# Patient Record
Sex: Female | Born: 1935 | Race: White | Hispanic: No | Smoking: Former smoker
Health system: Southern US, Community
[De-identification: ages and names within clinical notes are randomized; demographics above are authoritative.]

## PROBLEM LIST (undated history)

## (undated) DIAGNOSIS — M5136 Other intervertebral disc degeneration, lumbar region: Secondary | ICD-10-CM

## (undated) DIAGNOSIS — G20 Parkinson's disease: Secondary | ICD-10-CM

## (undated) DIAGNOSIS — L309 Dermatitis, unspecified: Secondary | ICD-10-CM

## (undated) DIAGNOSIS — K5909 Other constipation: Secondary | ICD-10-CM

## (undated) DIAGNOSIS — S72002A Fracture of unspecified part of neck of left femur, initial encounter for closed fracture: Secondary | ICD-10-CM

## (undated) DIAGNOSIS — S0990XA Unspecified injury of head, initial encounter: Secondary | ICD-10-CM

## (undated) DIAGNOSIS — R42 Dizziness and giddiness: Secondary | ICD-10-CM

## (undated) DIAGNOSIS — M25569 Pain in unspecified knee: Secondary | ICD-10-CM

## (undated) DIAGNOSIS — M5030 Other cervical disc degeneration, unspecified cervical region: Secondary | ICD-10-CM

## (undated) DIAGNOSIS — R29898 Other symptoms and signs involving the musculoskeletal system: Secondary | ICD-10-CM

## (undated) DIAGNOSIS — M51369 Other intervertebral disc degeneration, lumbar region without mention of lumbar back pain or lower extremity pain: Secondary | ICD-10-CM

## (undated) DIAGNOSIS — K2270 Barrett's esophagus without dysplasia: Secondary | ICD-10-CM

## (undated) DIAGNOSIS — D649 Anemia, unspecified: Secondary | ICD-10-CM

## (undated) DIAGNOSIS — I8390 Asymptomatic varicose veins of unspecified lower extremity: Secondary | ICD-10-CM

## (undated) DIAGNOSIS — K635 Polyp of colon: Secondary | ICD-10-CM

## (undated) DIAGNOSIS — M1990 Unspecified osteoarthritis, unspecified site: Secondary | ICD-10-CM

## (undated) DIAGNOSIS — I2089 Other forms of angina pectoris: Secondary | ICD-10-CM

## (undated) DIAGNOSIS — Z8781 Personal history of (healed) traumatic fracture: Secondary | ICD-10-CM

## (undated) DIAGNOSIS — R5383 Other fatigue: Secondary | ICD-10-CM

## (undated) DIAGNOSIS — R609 Edema, unspecified: Secondary | ICD-10-CM

## (undated) DIAGNOSIS — K649 Unspecified hemorrhoids: Secondary | ICD-10-CM

## (undated) DIAGNOSIS — N979 Female infertility, unspecified: Secondary | ICD-10-CM

## (undated) DIAGNOSIS — R269 Unspecified abnormalities of gait and mobility: Secondary | ICD-10-CM

## (undated) DIAGNOSIS — I10 Essential (primary) hypertension: Secondary | ICD-10-CM

## (undated) DIAGNOSIS — R600 Localized edema: Secondary | ICD-10-CM

## (undated) DIAGNOSIS — K219 Gastro-esophageal reflux disease without esophagitis: Secondary | ICD-10-CM

## (undated) DIAGNOSIS — I208 Other forms of angina pectoris: Secondary | ICD-10-CM

## (undated) DIAGNOSIS — G8929 Other chronic pain: Secondary | ICD-10-CM

## (undated) DIAGNOSIS — K5790 Diverticulosis of intestine, part unspecified, without perforation or abscess without bleeding: Secondary | ICD-10-CM

## (undated) DIAGNOSIS — H8110 Benign paroxysmal vertigo, unspecified ear: Secondary | ICD-10-CM

## (undated) HISTORY — DX: Female infertility, unspecified: N97.9

## (undated) HISTORY — DX: Other constipation: K59.09

## (undated) HISTORY — DX: Parkinson's disease: G20

## (undated) HISTORY — DX: Unspecified injury of head, initial encounter: S09.90XA

## (undated) HISTORY — DX: Benign paroxysmal vertigo, unspecified ear: H81.10

## (undated) HISTORY — DX: Polyp of colon: K63.5

## (undated) HISTORY — DX: Other forms of angina pectoris: I20.89

## (undated) HISTORY — DX: Barrett's esophagus without dysplasia: K22.70

## (undated) HISTORY — DX: Unspecified abnormalities of gait and mobility: R26.9

## (undated) HISTORY — DX: Essential (primary) hypertension: I10

## (undated) HISTORY — DX: Dizziness and giddiness: R42

## (undated) HISTORY — DX: Other forms of angina pectoris: I20.8

## (undated) HISTORY — DX: Other symptoms and signs involving the musculoskeletal system: R29.898

## (undated) HISTORY — DX: Gastro-esophageal reflux disease without esophagitis: K21.9

## (undated) HISTORY — PX: COLONOSCOPY W/ POLYPECTOMY: SHX1380

## (undated) HISTORY — DX: Diverticulosis of intestine, part unspecified, without perforation or abscess without bleeding: K57.90

## (undated) HISTORY — PX: TONSILLECTOMY: SUR1361

## (undated) HISTORY — PX: CHOLECYSTECTOMY: SHX55

## (undated) HISTORY — DX: Dermatitis, unspecified: L30.9

## (undated) HISTORY — DX: Unspecified hemorrhoids: K64.9

## (undated) HISTORY — DX: Unspecified osteoarthritis, unspecified site: M19.90

---
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
## 1986-12-27 HISTORY — PX: PALATE SURGERY: SHX729

## 1994-12-27 HISTORY — PX: KNEE ARTHROSCOPY: SUR90

## 1997-12-27 HISTORY — PX: KNEE ARTHROSCOPY: SUR90

## 1999-01-07 ENCOUNTER — Other Ambulatory Visit: Admission: RE | Admit: 1999-01-07 | Discharge: 1999-01-07 | Payer: Self-pay | Admitting: *Deleted

## 2000-04-20 ENCOUNTER — Other Ambulatory Visit: Admission: RE | Admit: 2000-04-20 | Discharge: 2000-04-20 | Payer: Self-pay | Admitting: *Deleted

## 2001-04-24 ENCOUNTER — Other Ambulatory Visit: Admission: RE | Admit: 2001-04-24 | Discharge: 2001-04-24 | Payer: Self-pay | Admitting: *Deleted

## 2001-12-15 ENCOUNTER — Ambulatory Visit (HOSPITAL_COMMUNITY): Admission: RE | Admit: 2001-12-15 | Discharge: 2001-12-15 | Payer: Self-pay | Admitting: Internal Medicine

## 2002-04-25 ENCOUNTER — Other Ambulatory Visit: Admission: RE | Admit: 2002-04-25 | Discharge: 2002-04-25 | Payer: Self-pay | Admitting: *Deleted

## 2003-04-29 ENCOUNTER — Other Ambulatory Visit: Admission: RE | Admit: 2003-04-29 | Discharge: 2003-04-29 | Payer: Self-pay | Admitting: *Deleted

## 2004-11-05 ENCOUNTER — Ambulatory Visit: Payer: Self-pay | Admitting: Family Medicine

## 2004-11-23 ENCOUNTER — Ambulatory Visit: Payer: Self-pay | Admitting: Family Medicine

## 2004-12-24 ENCOUNTER — Ambulatory Visit: Payer: Self-pay | Admitting: Family Medicine

## 2005-02-23 ENCOUNTER — Ambulatory Visit: Payer: Self-pay | Admitting: Family Medicine

## 2005-03-26 ENCOUNTER — Ambulatory Visit: Payer: Self-pay | Admitting: Family Medicine

## 2005-05-13 ENCOUNTER — Other Ambulatory Visit: Admission: RE | Admit: 2005-05-13 | Discharge: 2005-05-13 | Payer: Self-pay | Admitting: *Deleted

## 2005-07-22 ENCOUNTER — Ambulatory Visit: Payer: Self-pay | Admitting: Family Medicine

## 2005-08-25 ENCOUNTER — Ambulatory Visit (HOSPITAL_COMMUNITY): Admission: RE | Admit: 2005-08-25 | Discharge: 2005-08-25 | Payer: Self-pay | Admitting: *Deleted

## 2005-09-02 HISTORY — PX: CARDIAC CATHETERIZATION: SHX172

## 2005-09-29 ENCOUNTER — Ambulatory Visit (HOSPITAL_COMMUNITY): Admission: RE | Admit: 2005-09-29 | Discharge: 2005-09-29 | Payer: Self-pay | Admitting: *Deleted

## 2006-05-17 ENCOUNTER — Ambulatory Visit: Payer: Self-pay | Admitting: Family Medicine

## 2006-05-19 ENCOUNTER — Other Ambulatory Visit: Admission: RE | Admit: 2006-05-19 | Discharge: 2006-05-19 | Payer: Self-pay | Admitting: Obstetrics and Gynecology

## 2006-05-25 ENCOUNTER — Ambulatory Visit (HOSPITAL_COMMUNITY): Admission: RE | Admit: 2006-05-25 | Discharge: 2006-05-25 | Payer: Self-pay | Admitting: Family Medicine

## 2006-06-17 ENCOUNTER — Observation Stay (HOSPITAL_COMMUNITY): Admission: RE | Admit: 2006-06-17 | Discharge: 2006-06-18 | Payer: Self-pay | Admitting: General Surgery

## 2006-06-17 ENCOUNTER — Encounter (INDEPENDENT_AMBULATORY_CARE_PROVIDER_SITE_OTHER): Payer: Self-pay | Admitting: Specialist

## 2006-06-17 HISTORY — PX: CHOLECYSTECTOMY, LAPAROSCOPIC: SHX56

## 2006-08-02 ENCOUNTER — Ambulatory Visit: Payer: Self-pay | Admitting: Family Medicine

## 2006-09-14 ENCOUNTER — Ambulatory Visit: Payer: Self-pay | Admitting: Family Medicine

## 2006-11-02 ENCOUNTER — Ambulatory Visit: Payer: Self-pay | Admitting: Family Medicine

## 2006-11-03 ENCOUNTER — Ambulatory Visit: Payer: Self-pay | Admitting: Internal Medicine

## 2006-12-12 ENCOUNTER — Ambulatory Visit (HOSPITAL_COMMUNITY): Admission: RE | Admit: 2006-12-12 | Discharge: 2006-12-12 | Payer: Self-pay | Admitting: Internal Medicine

## 2006-12-12 ENCOUNTER — Ambulatory Visit: Payer: Self-pay | Admitting: Internal Medicine

## 2006-12-12 ENCOUNTER — Encounter (INDEPENDENT_AMBULATORY_CARE_PROVIDER_SITE_OTHER): Payer: Self-pay | Admitting: *Deleted

## 2006-12-12 HISTORY — PX: UPPER GI ENDOSCOPY: SHX6162

## 2007-03-30 ENCOUNTER — Ambulatory Visit: Payer: Self-pay | Admitting: Family Medicine

## 2007-06-22 ENCOUNTER — Other Ambulatory Visit: Admission: RE | Admit: 2007-06-22 | Discharge: 2007-06-22 | Payer: Self-pay | Admitting: Obstetrics and Gynecology

## 2007-07-17 ENCOUNTER — Ambulatory Visit: Payer: Self-pay | Admitting: Family Medicine

## 2007-11-08 ENCOUNTER — Encounter: Payer: Self-pay | Admitting: Family Medicine

## 2007-11-08 LAB — CONVERTED CEMR LAB
ALT: 12 units/L (ref 0–35)
AST: 19 units/L (ref 0–37)
Albumin: 4.3 g/dL (ref 3.5–5.2)
Alkaline Phosphatase: 61 units/L (ref 39–117)
BUN: 24 mg/dL — ABNORMAL HIGH (ref 6–23)
Basophils Absolute: 0 10*3/uL (ref 0.0–0.1)
Basophils Relative: 0 % (ref 0–1)
Bilirubin, Direct: 0.1 mg/dL (ref 0.0–0.3)
CO2: 23 meq/L (ref 19–32)
Calcium: 9.8 mg/dL (ref 8.4–10.5)
Chloride: 106 meq/L (ref 96–112)
Cholesterol: 164 mg/dL (ref 0–200)
Creatinine, Ser: 0.88 mg/dL (ref 0.40–1.20)
Eosinophils Absolute: 0.3 10*3/uL (ref 0.2–0.7)
Eosinophils Relative: 5 % (ref 0–5)
Glucose, Bld: 96 mg/dL (ref 70–99)
HCT: 38.7 % (ref 36.0–46.0)
HDL: 62 mg/dL (ref >39)
Hemoglobin: 12.4 g/dL (ref 12.0–15.0)
Indirect Bilirubin: 0.3 mg/dL (ref 0.0–0.9)
LDL Cholesterol: 84 mg/dL (ref 0–99)
Lymphocytes Relative: 13 % (ref 12–46)
Lymphs Abs: 0.8 10*3/uL (ref 0.7–4.0)
MCHC: 32 g/dL (ref 30.0–36.0)
MCV: 95.6 fL (ref 78.0–100.0)
Monocytes Absolute: 0.7 10*3/uL (ref 0.1–1.0)
Monocytes Relative: 12 % (ref 3–12)
Neutro Abs: 4.2 10*3/uL (ref 1.7–7.7)
Neutrophils Relative %: 71 % (ref 43–77)
Platelets: 189 10*3/uL (ref 150–400)
Potassium: 4.6 meq/L (ref 3.5–5.3)
RBC: 4.05 M/uL (ref 3.87–5.11)
RDW: 12.7 % (ref 11.5–15.5)
Sodium: 140 meq/L (ref 135–145)
TSH: 3.17 microintl units/mL (ref 0.350–5.50)
Total Bilirubin: 0.4 mg/dL (ref 0.3–1.2)
Total CHOL/HDL Ratio: 2.6
Total Protein: 7.2 g/dL (ref 6.0–8.3)
Triglycerides: 88 mg/dL (ref <150)
VLDL: 18 mg/dL (ref 0–40)
WBC: 5.9 10*3/uL (ref 4.0–10.5)

## 2007-11-14 ENCOUNTER — Ambulatory Visit: Payer: Self-pay | Admitting: Family Medicine

## 2008-02-25 HISTORY — PX: OTHER SURGICAL HISTORY: SHX169

## 2008-03-19 ENCOUNTER — Ambulatory Visit: Payer: Self-pay | Admitting: Family Medicine

## 2008-03-29 DIAGNOSIS — K649 Unspecified hemorrhoids: Secondary | ICD-10-CM | POA: Insufficient documentation

## 2008-03-29 DIAGNOSIS — R42 Dizziness and giddiness: Secondary | ICD-10-CM | POA: Insufficient documentation

## 2008-06-24 ENCOUNTER — Other Ambulatory Visit: Admission: RE | Admit: 2008-06-24 | Discharge: 2008-06-24 | Payer: Self-pay | Admitting: Obstetrics and Gynecology

## 2008-09-30 ENCOUNTER — Encounter: Payer: Self-pay | Admitting: Family Medicine

## 2008-09-30 LAB — CONVERTED CEMR LAB
ALT: 12 units/L (ref 0–35)
AST: 18 units/L (ref 0–37)
Albumin: 4.2 g/dL (ref 3.5–5.2)
Alkaline Phosphatase: 58 units/L (ref 39–117)
BUN: 17 mg/dL (ref 6–23)
Basophils Absolute: 0 10*3/uL (ref 0.0–0.1)
Basophils Relative: 1 % (ref 0–1)
Bilirubin, Direct: <0.1 mg/dL (ref 0.0–0.3)
CO2: 23 meq/L (ref 19–32)
Calcium: 9.4 mg/dL (ref 8.4–10.5)
Chloride: 103 meq/L (ref 96–112)
Cholesterol: 170 mg/dL (ref 0–200)
Creatinine, Ser: 0.89 mg/dL (ref 0.40–1.20)
Eosinophils Absolute: 0.1 10*3/uL (ref 0.0–0.7)
Eosinophils Relative: 2 % (ref 0–5)
Glucose, Bld: 93 mg/dL (ref 70–99)
HCT: 37.6 % (ref 36.0–46.0)
HDL: 63 mg/dL (ref >39)
Hemoglobin: 11.9 g/dL — ABNORMAL LOW (ref 12.0–15.0)
LDL Cholesterol: 87 mg/dL (ref 0–99)
Lymphocytes Relative: 15 % (ref 12–46)
Lymphs Abs: 0.9 10*3/uL (ref 0.7–4.0)
MCHC: 31.6 g/dL (ref 30.0–36.0)
MCV: 93.5 fL (ref 78.0–100.0)
Monocytes Absolute: 0.6 10*3/uL (ref 0.1–1.0)
Monocytes Relative: 9 % (ref 3–12)
Neutro Abs: 4.4 10*3/uL (ref 1.7–7.7)
Neutrophils Relative %: 74 % (ref 43–77)
Platelets: 184 10*3/uL (ref 150–400)
Potassium: 4.7 meq/L (ref 3.5–5.3)
RBC: 4.02 M/uL (ref 3.87–5.11)
RDW: 12.8 % (ref 11.5–15.5)
Sodium: 138 meq/L (ref 135–145)
Total Bilirubin: 0.3 mg/dL (ref 0.3–1.2)
Total CHOL/HDL Ratio: 2.7
Total Protein: 7 g/dL (ref 6.0–8.3)
Triglycerides: 99 mg/dL (ref <150)
VLDL: 20 mg/dL (ref 0–40)
WBC: 5.9 10*3/uL (ref 4.0–10.5)

## 2008-10-16 ENCOUNTER — Ambulatory Visit: Payer: Self-pay | Admitting: Family Medicine

## 2008-10-16 DIAGNOSIS — R5381 Other malaise: Secondary | ICD-10-CM | POA: Insufficient documentation

## 2008-10-16 DIAGNOSIS — R5383 Other fatigue: Secondary | ICD-10-CM

## 2008-10-16 DIAGNOSIS — L989 Disorder of the skin and subcutaneous tissue, unspecified: Secondary | ICD-10-CM | POA: Insufficient documentation

## 2008-10-16 DIAGNOSIS — I10 Essential (primary) hypertension: Secondary | ICD-10-CM | POA: Insufficient documentation

## 2008-10-16 DIAGNOSIS — K2270 Barrett's esophagus without dysplasia: Secondary | ICD-10-CM | POA: Insufficient documentation

## 2009-03-17 ENCOUNTER — Telehealth: Payer: Self-pay | Admitting: Family Medicine

## 2009-03-19 ENCOUNTER — Telehealth: Payer: Self-pay | Admitting: Family Medicine

## 2009-04-07 ENCOUNTER — Encounter: Payer: Self-pay | Admitting: Family Medicine

## 2009-04-14 ENCOUNTER — Ambulatory Visit: Payer: Self-pay | Admitting: Family Medicine

## 2009-04-14 LAB — CONVERTED CEMR LAB
Hemoglobin: 11.8 g/dL — ABNORMAL LOW (ref 12.0–15.0)
MCHC: 33.4 g/dL (ref 30.0–36.0)
RBC: 3.89 M/uL (ref 3.87–5.11)

## 2009-04-18 ENCOUNTER — Encounter: Payer: Self-pay | Admitting: Family Medicine

## 2009-04-19 DIAGNOSIS — M159 Polyosteoarthritis, unspecified: Secondary | ICD-10-CM | POA: Insufficient documentation

## 2009-09-09 ENCOUNTER — Ambulatory Visit: Payer: Self-pay | Admitting: Family Medicine

## 2009-09-09 DIAGNOSIS — L500 Allergic urticaria: Secondary | ICD-10-CM | POA: Insufficient documentation

## 2009-09-16 ENCOUNTER — Telehealth: Payer: Self-pay | Admitting: Family Medicine

## 2009-09-23 ENCOUNTER — Ambulatory Visit: Payer: Self-pay | Admitting: Family Medicine

## 2009-11-07 ENCOUNTER — Encounter: Payer: Self-pay | Admitting: Cardiology

## 2009-11-17 ENCOUNTER — Encounter (INDEPENDENT_AMBULATORY_CARE_PROVIDER_SITE_OTHER): Payer: Self-pay | Admitting: *Deleted

## 2009-11-17 ENCOUNTER — Telehealth: Payer: Self-pay | Admitting: Family Medicine

## 2009-11-17 LAB — CONVERTED CEMR LAB
BUN: 21 mg/dL
BUN: 21 mg/dL (ref 6–23)
Basophils Relative: 1 %
Basophils Relative: 1 % (ref 0–1)
CO2: 22 meq/L (ref 19–32)
Calcium: 9.8 mg/dL
Calcium: 9.8 mg/dL (ref 8.4–10.5)
Chloride: 103 meq/L
Cholesterol: 176 mg/dL
Creatinine, Ser: 0.9 mg/dL (ref 0.40–1.20)
Eosinophils Absolute: 0.3 10*3/uL (ref 0.0–0.7)
Glucose, Bld: 78 mg/dL
Glucose, Bld: 78 mg/dL (ref 70–99)
HCT: 36.5 %
HDL: 56 mg/dL
Hemoglobin: 11.8 g/dL — ABNORMAL LOW (ref 12.0–15.0)
LDL Cholesterol: 101 mg/dL
LDL Cholesterol: 101 mg/dL
MCHC: 32.3 g/dL (ref 30.0–36.0)
MCV: 92.6 fL (ref 78.0–100.0)
Monocytes Absolute: 0.6 10*3/uL
Monocytes Absolute: 0.6 10*3/uL (ref 0.1–1.0)
Monocytes Relative: 10 %
Monocytes Relative: 10 % (ref 3–12)
Platelets: 203 10*3/uL
Potassium: 4.7 meq/L
RBC: 3.94 M/uL (ref 3.87–5.11)
Sodium: 140 meq/L
Sodium: 140 meq/L
Triglycerides: 96 mg/dL
WBC: 5.8 10*3/uL

## 2009-11-25 ENCOUNTER — Ambulatory Visit: Payer: Self-pay | Admitting: Family Medicine

## 2009-11-27 ENCOUNTER — Encounter: Payer: Self-pay | Admitting: Family Medicine

## 2009-12-10 ENCOUNTER — Encounter (INDEPENDENT_AMBULATORY_CARE_PROVIDER_SITE_OTHER): Payer: Self-pay | Admitting: *Deleted

## 2009-12-12 ENCOUNTER — Ambulatory Visit: Payer: Self-pay | Admitting: Cardiology

## 2009-12-12 ENCOUNTER — Encounter (INDEPENDENT_AMBULATORY_CARE_PROVIDER_SITE_OTHER): Payer: Self-pay | Admitting: *Deleted

## 2009-12-12 DIAGNOSIS — I2510 Atherosclerotic heart disease of native coronary artery without angina pectoris: Secondary | ICD-10-CM | POA: Insufficient documentation

## 2009-12-12 DIAGNOSIS — R0989 Other specified symptoms and signs involving the circulatory and respiratory systems: Secondary | ICD-10-CM | POA: Insufficient documentation

## 2009-12-12 DIAGNOSIS — R0789 Other chest pain: Secondary | ICD-10-CM | POA: Insufficient documentation

## 2009-12-25 ENCOUNTER — Encounter: Payer: Self-pay | Admitting: Cardiology

## 2010-03-16 ENCOUNTER — Telehealth: Payer: Self-pay | Admitting: Family Medicine

## 2010-04-09 ENCOUNTER — Encounter: Payer: Self-pay | Admitting: Family Medicine

## 2010-04-29 ENCOUNTER — Encounter: Payer: Self-pay | Admitting: Family Medicine

## 2010-05-04 ENCOUNTER — Telehealth: Payer: Self-pay | Admitting: Family Medicine

## 2010-05-07 ENCOUNTER — Ambulatory Visit: Payer: Self-pay | Admitting: Cardiology

## 2010-05-07 ENCOUNTER — Encounter (HOSPITAL_COMMUNITY): Admission: RE | Admit: 2010-05-07 | Discharge: 2010-05-07 | Payer: Self-pay | Admitting: Cardiology

## 2010-05-11 ENCOUNTER — Ambulatory Visit: Payer: Self-pay | Admitting: Cardiology

## 2010-05-11 ENCOUNTER — Encounter: Payer: Self-pay | Admitting: Adult Health

## 2010-05-14 ENCOUNTER — Observation Stay (HOSPITAL_COMMUNITY): Admission: RE | Admit: 2010-05-14 | Discharge: 2010-05-15 | Payer: Self-pay | Admitting: Orthopedic Surgery

## 2010-05-14 ENCOUNTER — Encounter: Payer: Self-pay | Admitting: Family Medicine

## 2010-05-14 HISTORY — PX: ROTATOR CUFF REPAIR: SHX139

## 2010-05-19 ENCOUNTER — Telehealth (INDEPENDENT_AMBULATORY_CARE_PROVIDER_SITE_OTHER): Payer: Self-pay

## 2010-08-04 ENCOUNTER — Encounter (INDEPENDENT_AMBULATORY_CARE_PROVIDER_SITE_OTHER): Payer: Self-pay | Admitting: *Deleted

## 2010-08-13 ENCOUNTER — Ambulatory Visit: Payer: Self-pay | Admitting: Family Medicine

## 2010-08-14 ENCOUNTER — Encounter: Payer: Self-pay | Admitting: Family Medicine

## 2010-08-21 ENCOUNTER — Encounter: Payer: Self-pay | Admitting: Family Medicine

## 2010-09-16 ENCOUNTER — Ambulatory Visit: Payer: Self-pay | Admitting: Family Medicine

## 2010-11-18 LAB — CONVERTED CEMR LAB
BUN: 16 mg/dL (ref 6–23)
CO2: 27 meq/L (ref 19–32)
Chloride: 102 meq/L (ref 96–112)
Glucose, Bld: 88 mg/dL (ref 70–99)
LDL Cholesterol: 100 mg/dL — ABNORMAL HIGH (ref 0–99)
Potassium: 4.8 meq/L (ref 3.5–5.3)
TSH: 3.181 microintl units/mL (ref 0.350–4.500)
VLDL: 16 mg/dL (ref 0–40)

## 2010-12-29 ENCOUNTER — Ambulatory Visit
Admission: RE | Admit: 2010-12-29 | Discharge: 2010-12-29 | Payer: Self-pay | Source: Home / Self Care | Attending: Internal Medicine | Admitting: Internal Medicine

## 2010-12-31 LAB — HM COLONOSCOPY

## 2011-01-05 ENCOUNTER — Ambulatory Visit
Admission: RE | Admit: 2011-01-05 | Discharge: 2011-01-05 | Payer: Self-pay | Source: Home / Self Care | Attending: Family Medicine | Admitting: Family Medicine

## 2011-01-06 ENCOUNTER — Encounter: Payer: Self-pay | Admitting: Family Medicine

## 2011-01-26 NOTE — Progress Notes (Signed)
Summary: copy of lab  Phone Note Call from Patient   Summary of Call: needs a copy of lab work and have a stress test on thurs. wants to speak with dr. Lodema Hong about having surgery Initial call taken by: Rudene Anda,  May 04, 2010 4:12 PM  Follow-up for Phone Call        faxed last labs to dr Simonne Come as requested per patient Follow-up by: Adella Hare LPN,  May 04, 9562 4:46 PM

## 2011-01-26 NOTE — Letter (Signed)
Summary: [facility]  [facility]   Imported By: Lind Guest 08/21/2010 16:02:11  _____________________________________________________________________  External Attachment:    Type:   Image     Comment:   External Document

## 2011-01-26 NOTE — Letter (Signed)
Summary: patient surgical history  patient surgical history   Imported By: Dreama Saa, CNA 12/30/2009 10:19:57  _____________________________________________________________________  External Attachment:    Type:   Image     Comment:   External Document

## 2011-01-26 NOTE — Miscellaneous (Signed)
Summary: CBCD,BMP,LIPIDS,11/17/2009  Clinical Lists Changes  Observations: Added new observation of CALCIUM: 9.8 mg/dL (02/54/2706 23:76) Added new observation of CREATININE: 0.90 mg/dL (28/31/5176 16:07) Added new observation of BUN: 21 mg/dL (37/09/6268 48:54) Added new observation of BG RANDOM: 78 mg/dL (62/70/3500 93:81) Added new observation of CO2 PLSM/SER: 22 meq/L (11/17/2009 11:40) Added new observation of CL SERUM: 103 meq/L (11/17/2009 11:40) Added new observation of K SERUM: 4.7 meq/L (11/17/2009 11:40) Added new observation of NA: 140 meq/L (11/17/2009 11:40) Added new observation of LDL: 101 mg/dL (82/99/3716 96:78) Added new observation of HDL: 56 mg/dL (93/81/0175 10:25) Added new observation of TRIGLYC TOT: 96 mg/dL (85/27/7824 23:53) Added new observation of CHOLESTEROL: 176 mg/dL (61/44/3154 00:86) Added new observation of ABSOLUTE BAS: 0.0 K/uL (11/17/2009 11:40) Added new observation of BASOPHIL %: 1 % (11/17/2009 11:40) Added new observation of EOS ABSLT: 0.3 K/uL (11/17/2009 11:40) Added new observation of % EOS AUTO: 6 % (11/17/2009 11:40) Added new observation of ABSOLUTE MON: 0.6 K/uL (11/17/2009 11:40) Added new observation of MONOCYTE %: 10 % (11/17/2009 11:40) Added new observation of ABS LYMPHOCY: 0.7 K/uL (11/17/2009 11:40) Added new observation of LYMPHS %: 12 % (11/17/2009 11:40) Added new observation of PLATELETK/UL: 203 K/uL (11/17/2009 11:40) Added new observation of RDW: 13.0 % (11/17/2009 11:40) Added new observation of MCHC RBC: 32.3 g/dL (76/19/5093 26:71) Added new observation of MCV: 92.6 fL (11/17/2009 11:40) Added new observation of HCT: 36.5 % (11/17/2009 11:40) Added new observation of HGB: 11.8 g/dL (24/58/0998 33:82) Added new observation of RBC M/UL: 3.94 M/uL (11/17/2009 11:40) Added new observation of WBC COUNT: 5.8 10*3/microliter (11/17/2009 11:40)

## 2011-01-26 NOTE — Letter (Signed)
Summary: [facility]  [facility]   Imported By: Lind Guest 08/21/2010 16:03:10  _____________________________________________________________________  External Attachment:    Type:   Image     Comment:   External Document

## 2011-01-26 NOTE — Letter (Signed)
Summary: medical release  medical release   Imported By: Lind Guest 08/14/2010 14:58:07  _____________________________________________________________________  External Attachment:    Type:   Image     Comment:   External Document

## 2011-01-26 NOTE — Assessment & Plan Note (Signed)
Summary: MEDS   Vital Signs:  Patient profile:   77 year old female Menstrual status:  postmenopausal Height:      66.5 inches Weight:      162.50 pounds BMI:     25.93 O2 Sat:      100 % on Room air Pulse rate:   64 / minute Pulse rhythm:   regular Resp:     16 per minute BP sitting:   150 / 80  (left arm)  Vitals Entered By: Adella Hare LPN (August 13, 2010 10:38 AM)  Nutrition Counseling: Patient's BMI is greater than 25 and therefore counseled on weight management options.  O2 Flow:  Room air CC: follow-up visit Is Patient Diabetic? No   Primary Care Provider:  [name] MD  CC:  follow-up visit.  History of Present Illness: Reports  that she has been doing fairly well.. Denies recent fever or chills. Denies sinus pressure, nasal congestion , ear pain or sore throat. Denies chest congestion, or cough productive of sputum. Denies chest pain, palpitations, PND, orthopnea , she does report some leg sweling and fatigue. Denies abdominal pain, nausea, vomitting, diarrhea or constipation. Denies change in bowel movements or bloody stool. Denies dysuria , frequency, incontinence or hesitancy.  Denies headaches, vertigo, seizures. Denies depression, anxiety or insomnia. Denies  rash, lesions, or itch.     Current Medications (verified): 1)  Norvasc 5 Mg Tabs (Amlodipine Besylate) .... Take 1 Tablet By Mouth Once Daily 2)  Colace 100 Mg  Caps (Docusate Sodium) .... Take 1 Cap By Mouth Three Times A Day 3)  Multivitamins   Tabs (Multiple Vitamin) .... Take 1 Tablet By Mouth Once A Day 4)  Altace 5 Mg  Tabs (Ramipril) .... Take 1 Tablet By Mouth Once A Day 5)  Vitamin D 400 Unit  Tabs (Cholecalciferol) .... Take 1 Table Monthly 6)  Prilosec Otc 20 Mg Tbec (Omeprazole Magnesium) .... Take One Tab By Mouth Every Morning 7)  Phillips 500 Mg Tabs (Magnesium Oxide) .... Take Two Tabs Every Other Day 8)  Meclizine Hcl 12.5 Mg Tabs (Meclizine Hcl) .... Take As  Needed 9)  Healthy Colon  Caps (Misc Intestinal Flora Regulat) .... Take One Tab By Mouth Once Daily 10)  Aleve 220 Mg Tabs (Naproxen Sodium) .... Take 2 Tabs Two Times A Day 11)  Benefiber  Powd (Wheat Dextrin) .... Use 2 Scoops By Mouth Three Times A Day 12)  Nitrostat 0.4 Mg Subl (Nitroglycerin) .Marland Kitchen.. 1 Tablet Under Tongue At Onset of Chest Pain; You May Repeat Every 5 Minutes For Up To 3 Doses. 13)  Metoprolol Succinate 25 Mg Xr24h-Tab (Metoprolol Succinate) .... Take 1 Tablet By Mouth Once Daily 14)  Viactiv 500-500-40 Mg-Unt-Mcg Chew (Calcium-Vitamin D-Vitamin K) .... One Chew Two Times A Day  Allergies (verified): 1)  ! Penicillin  Past History:  Past medical, surgical, family and social histories (including risk factors) reviewed, and no changes noted (except as noted below).  Past Medical History: Reviewed history from 11/28/2007 and no changes required. HYPERTENSION RECURRENT DERMATITIS OSTEOARTHRITIS OF NECK CHRONIC CONSTIPATION DIVERTICULOSIS GERD STATUS POST FALL WITH TRAUMA TO HEAD, UPPER AND LOWER EXTREMITIES VITAMIN D DEFICIENCY RECURRNET VERTIGO OSTEOPOROSIS, KNEES (ICD-733.00) INTERMITTENT VERTIGO (ICD-780.4) HEMORRHOIDS (ICD-455.6)  Past Surgical History: Rt knee arthroscopy 1999 Lt knee arthroscopy 1996 Cholecystectomy 2007 rotator cuff surgery May 14, 2010  Family History: Reviewed history from 11/28/2007 and no changes required. MOTHER DECEASED CVA/ HEART DISEASE/ HTN FATHER DECEASED SISTER DECEASED  PARKINSONS ZERO BROTHERS  Social History: Reviewed history from 11/28/2007 and no changes required. Retired Married Former Smoker Alcohol use-no Drug use-no  Review of Systems      See HPI General:  Complains of fatigue. Eyes:  Denies discharge, eye pain, and red eye. ENT:  Denies nasal congestion, sinus pressure, and sore throat. CV:  Complains of shortness of breath with exertion and swelling of feet; denies difficulty breathing while lying  down and palpitations; reports swelling of feet with inc dose of amlodipine and has not been taking it, alsofatigue, wants to g back to [employer], dr Allyson Sabal. GI:  Denies abdominal pain, constipation, diarrhea, nausea, and vomiting. GU:  Denies dysuria, urinary frequency, and urinary hesitancy. MS:  Complains of joint pain; had shoulder surgery several months ago, currently in therapy, multiple general;ised joint pains. Derm:  Denies itching and rash. Neuro:  Complains of falling down and poor balance; denies headaches; duento musculoskeletl probs. Psych:  Denies anxiety and depression. Endo:  Denies cold intolerance, excessive thirst, excessive urination, and polyuria. Heme:  Denies abnormal bruising and bleeding. Allergy:  Denies hives or rash and itching eyes.  Physical Exam  General:  Well-developed,well-nourished,in no acute distress; alert,appropriate and cooperative throughout examination HEENT: No facial asymmetry,  EOMI, No sinus tenderness, TM's Clear, oropharynx  pink and moist.   Chest: Clear to auscultation bilaterally.  CVS: S1, S2, No murmurs, No S3.   Abd: Soft, Nontender.  MS: decreased  ROM spine, hips, shoulders and knees.  Ext: No edema.   CNS: CN 2-12 intact, power tone and sensation normal throughout.   Skin: Intact, no visible lesions or rashes.  Psych: Good eye contact, normal affect.  Memory intact, not anxious or depressed appearing.    Impression & Recommendations:  Problem # 1:  HYPERTENSION (ICD-401.9) Assessment Unchanged  The following medications were removed from the medication list:    Norvasc 5 Mg Tabs (Amlodipine besylate) .Marland Kitchen... Take 1 tablet by mouth once daily    Altace 5 Mg Tabs (Ramipril) .Marland Kitchen... Take 1 tablet by mouth once a day    Metoprolol Succinate 25 Mg Xr24h-tab (Metoprolol succinate) .Marland Kitchen... Take 1 tablet by mouth once daily Her updated medication list for this problem includes:    Amlodipine Besylate 5 Mg Tabs (Amlodipine  besylate) .Marland Kitchen... Take 1 tablet by mouth once a day    Ramipril 10 Mg Caps (Ramipril) .Marland Kitchen... Take 1 capsule by mouth once a day  Orders: T-Basic Metabolic Panel [id number])  BP today: 150/80 Prior BP: 146/67 (05/11/2010)  Labs Reviewed: K+: 4.7 (11/17/2009) Creat: : 0.90 (11/17/2009)   Chol: 176 (11/17/2009)   HDL: 56 (11/17/2009)   LDL: 101 (11/17/2009)   TG: 96 (11/17/2009)  Problem # 2:  BARRETTS ESOPHAGUS (ICD-530.85) Assessment: Comment Only need info from Dr Renae Fickle as to when next upper endo dueaqlso when next colonscopy is due  Complete Medication List: 1)  Colace 100 Mg Caps (Docusate sodium) .... Take 1 cap by mouth three times a day 2)  Multivitamins Tabs (Multiple vitamin) .... Take 1 tablet by mouth once a day 3)  Vitamin D 400 Unit Tabs (Cholecalciferol) .... Take 1 table monthly 4)  Prilosec Otc 20 Mg Tbec (Omeprazole magnesium) .... Take one tab by mouth every morning 5)  Phillips 500 Mg Tabs (Magnesium oxide) .... Take two tabs every other day 6)  Meclizine Hcl 12.5 Mg Tabs (Meclizine hcl) .... Take as needed 7)  Healthy Colon Caps (Misc intestinal flora regulat) .... Take one tab by mouth once daily 8)  Aleve 220 Mg Tabs (Naproxen sodium) .... Take 2 tabs two times a day 9)  Benefiber Powd (Wheat dextrin) .... Use 2 scoops by mouth three times a day 10)  Nitrostat 0.4 Mg Subl (Nitroglycerin) .Marland Kitchen.. 1 tablet under tongue at onset of chest pain; you may repeat every 5 minutes for up to 3 doses. 11)  Viactiv 500-500-40 Mg-unt-mcg Chew (Calcium-vitamin d-vitamin k) .... One chew two times a day 12)  Amlodipine Besylate 5 Mg Tabs (Amlodipine besylate) .... Take 1 tablet by mouth once a day 13)  Ramipril 10 Mg Caps (Ramipril) .... Take 1 capsule by mouth once a day  Other Orders: T-Lipid Profile ([id number]) T-TSH ([id number])  Patient Instructions: 1)  Please schedule a follow-up appointment in 4 months. 2)  BMP prior to visit, ICD-9: 3)  Lipid Panel prior to visit,  ICD-9:  fasting in 4 months 4)  TSH prior to visit, ICD-9: 5)  med changes as discusssed.  Prescriptions: RAMIPRIL 10 MG CAPS (RAMIPRIL) Take 1 capsule by mouth once a day  #30 x 3   Entered and Authorized by:   [name] MD   Signed by:   [name] MD on 08/13/2010   Method used:   Printed then faxed to ...       [facility] [address] [address] [id number]       [address]       [place], [place]  [address]       Ph: [phone] or [phone]       Fax: [phone]   RxID:   [id number] AMLODIPINE BESYLATE 5 MG TABS (AMLODIPINE BESYLATE) Take 1 tablet by mouth once a day  #30 x 3   Entered and Authorized by:   [name] MD   Signed by:   [name] MD on 08/13/2010   Method used:   Printed then faxed to ...       [facility] [address] [id number]       [address]       [place], [place]  [address]       Ph: [phone] or [phone]       Fax: [phone]   RxID:   [id number]

## 2011-01-26 NOTE — Progress Notes (Signed)
Summary: Medication Side Effects  Phone Note Call from Patient   Caller: Patient Reason for Call: Talk to Nurse Summary of Call: S: pt states she is having medication side effects/pls return call/tg Initial call taken by: Raechel Ache [facility],  May 19, 2010 3:39 PM  Follow-up for Phone Call        B: On last office visit of 05-11-10, pt. was started on Metoprolol 25mg  once daily and increased Amlodipine to 10mg  from 5 mg once daily. A: Pt. states that since starting Metoprolol she has pitting edema and is no longer going to take.  R: Pt. advised that edema is not a side effect of Metoprolol but that the increase in Amlodipine maybe causing swelling. Pt. states she will continue taking Metoprolol 25mg  once daily but not the increased dose of Amlodipine, she will go back to 5 mg daily.  Follow-up by: Larita Fife Via LPN,  May 19, 2010 4:55 PM  Additional Follow-up for Phone Call Additional follow up Details #1::         Reviewed Juanito Doom, MD  Additional Follow-up by: Gaylord Shih, MD, [facility],  May 19, 2010 5:26 PM    New/Updated Medications: NORVASC 5 MG TABS (AMLODIPINE BESYLATE) take 1 tablet by mouth once daily

## 2011-01-26 NOTE — Assessment & Plan Note (Signed)
Summary: FLU SHOT  Nurse Visit   Allergies: 1)  ! Penicillin  Immunizations Administered:  Influenza Vaccine # 1:    Vaccine Type: Fluvax Non-MCR    Site: right deltoid    Mfr: novartis    Dose: 0.5 ml    Route: IM    Given by: Adella Hare LPN    Exp. Date: 04/2011    Lot #: [id number]    VIS given: 07/21/10 version given September 16, 2010.  Orders Added: 1)  Influenza Vaccine NON MCR [00028]

## 2011-01-26 NOTE — Assessment & Plan Note (Signed)
Summary: f/u myoview to be done on 5/12/tg   Visit Type:  Follow-up Primary Provider:  [name] MD  CC:  NO CARDIOLOGY COMPLAINTS TODAY.  History of Present Illness: Kerry Flynn is a very pleasant 52 CF with known history of non-obstructive CAD per cardiac cath 2006, stable angina (for years), hypertension, and recent diagnosis of torn R rotator cuff.   When last seen by Dr. Daleen Squibb in 11/2009, she complained of the chronic exertional chest discomfort. Dr. Daleen Squibb thought that she needed to have a cardiac cath, but the patient was reluctant to proceed but agreed to a stress myoview.  She did not complete this right away.  She was tested on 05/07/2010.  She is to have a planned R rotator cuff repair once she is cleared by cardiology and waited to have stress test until just before this visit.  Her surgeon is Dr.[place], [facility], [address], [address], [place].  She continues to have exertional chest discomfort, walking up stairs or steep incline.  Otherwise she is complaining of R shoulder and arm pain.  Review of the stress myoview dated 05/07/2010 revealed adequate exercise tolerance for age, a negative stress EKG, normal left ventricular systolic function and  normal myocardial perfusion.  Current Medications (verified): 1)  Norvasc 10 Mg Tabs (Amlodipine Besylate) .... Take 1 Tablet By Mouth Once Daily 2)  Colace 100 Mg  Caps (Docusate Sodium) .... Take 1 Tablet By Mouth Two Times A Day 3)  Multivitamins   Tabs (Multiple Vitamin) .... Take 1 Tablet By Mouth Once A Day 4)  Altace 5 Mg  Tabs (Ramipril) .... Take 1 Tablet By Mouth Once A Day 5)  Vitamin D 400 Unit  Tabs (Cholecalciferol) .... Take 1 Table Monthly 6)  Prilosec Otc 20 Mg Tbec (Omeprazole Magnesium) .... Take One Tab By Mouth Every Morning 7)  Phillips 500 Mg Tabs (Magnesium Oxide) .... Take Two Tabs Every Other Day 8)  Meclizine Hcl 12.5 Mg Tabs (Meclizine Hcl) .... Take As Needed 9)  Healthy Colon  Caps  (Misc Intestinal Flora Regulat) .... Take 2 Tab By Mouth Two Times A Day 10)  Aleve 220 Mg Tabs (Naproxen Sodium) .... Take 2 Tabs Two Times A Day 11)  Benefiber  Powd (Wheat Dextrin) .... Use 1 Scoop Two Times A Day 12)  Nitrostat 0.4 Mg Subl (Nitroglycerin) .Marland Kitchen.. 1 Tablet Under Tongue At Onset of Chest Pain; You May Repeat Every 5 Minutes For Up To 3 Doses. 13)  Metoprolol Succinate 25 Mg Xr24h-Tab (Metoprolol Succinate) .... Take 1 Tablet By Mouth Once Daily  Allergies (verified): 1)  ! Penicillin PMH-FH-SH reviewed-no changes except otherwise noted  Review of Systems       Chronic exertional chest discomfort and R shoulder and arm pain.  All other systems have been reviewed and are negative unless stated above.   Vital Signs:  Patient profile:   77 year old female Menstrual status:  postmenopausal Weight:      166 pounds Pulse rate:   77 / minute BP sitting:   146 / 67  (right arm)  Vitals Entered By: Dreama Saa, CNA (May 11, 2010 1:16 PM)  Physical Exam  General:  Well developed, well nourished, in no acute distress. Head:  normocephalic and atraumatic Eyes:  PERRLA/EOM intact; conjunctiva and lids normal. Ears:  TM's intact and clear with normal canals and hearing Nose:  no deformity, discharge, inflammation, or lesions Mouth:  Teeth, gums and palate normal. Oral mucosa normal. Lungs:  Clear bilaterally to auscultation and percussion. Heart:  Non-displaced PMI, chest non-tender; regular rate and rhythm, S1, S2 without murmurs, rubs or gallops. Carotid upstroke normal, no bruit. Normal abdominal aortic size, no bruits. Femorals normal pulses, no bruits. Pedals normal pulses. No edema, no varicosities. Abdomen:  Bowel sounds positive; abdomen soft and non-tender without masses, organomegaly, or hernias noted. No hepatosplenomegaly. Msk:  decreased ROM right shoulder and arm. Extremities:  R arm painful ROM good pulses.  No edema Neurologic:  Alert and oriented x 3. Psych:   Normal affect.   Impression & Recommendations:  Problem # 1:  PRE-OPERATIVE CARDIAC EXAM (ICD-V72.81) Kerry Flynn is stable from a cardiac standpoint.  Her angina is chronic and has not changed in intensity or duration.  Stress myoview is negative for ischemia.  She is a low risk for cardiac complications peri-operatively.She has been seen and examined by Dr. Dietrich Pates as well during this visit.  He has cleared her to proceed with rotator cuff repair. The following medications were removed from the medication list:M    Aspirin 81 Mg Tbec (Aspirin) .Marland Kitchen... Take one tablet by mouth daily Her updated medication list for this problem includes:    Norvasc 10 Mg Tabs (Amlodipine besylate) .Marland Kitchen... Take 1 tablet by mouth once daily    Altace 5 Mg Tabs (Ramipril) .Marland Kitchen... Take 1 tablet by mouth once a day    Nitrostat 0.4 Mg Subl (Nitroglycerin) .Marland Kitchen... 1 tablet under tongue at onset of chest pain; you may repeat every 5 minutes for up to 3 doses.    Metoprolol Succinate 25 Mg Xr24h-tab (Metoprolol succinate) .Marland Kitchen... Take 1 tablet by mouth once daily  Problem # 2:  HYPERTENSION (ICD-401.9) She has been mildly hypertensive during last two visits.,  We will add low dose metoprolol 25mg  daily for angina symptoms and blood pressure.  She will follow up with Dr. Daleen Squibb in 3 months. The following medications were removed from the medication list:    Aspirin 81 Mg Tbec (Aspirin) .Marland Kitchen... Take one tablet by mouth daily Her updated medication list for this problem includes:    Norvasc 10 Mg Tabs (Amlodipine besylate) .Marland Kitchen... Take 1 tablet by mouth once daily    Altace 5 Mg Tabs (Ramipril) .Marland Kitchen... Take 1 tablet by mouth once a day    Metoprolol Succinate 25 Mg Xr24h-tab (Metoprolol succinate) .Marland Kitchen... Take 1 tablet by mouth once daily  Patient Instructions: 1)  Your physician recommends that you schedule a follow-up appointment in: 3 months 2)  Your physician has recommended you make the following change in your medication:  Start taking Metoprolol 25mg  by mouth once daily and increase Amlodipine (Norvasc) to 10mg  by mouth once daily  Prescriptions: NORVASC 10 MG TABS (AMLODIPINE BESYLATE) take 1 tablet by mouth once daily  #30 x 3   Entered by:   Larita Fife Via LPN   Authorized by:   Joni Reining, NP   Signed by:   Larita Fife Via LPN on 65/78/4696   Method used:   [facility] to        [facility] [id number]       [address]       [place], [place]  [address]       Ph: [phone] or [phone]       Fax: [phone]   RxID:   [id number] METOPROLOL SUCCINATE 25 MG XR24H-TAB (METOPROLOL SUCCINATE) take 1 tablet by mouth once daily  #30 x  3   Entered by:   Larita Fife Via LPN   Authorized by:   Joni Reining, NP   Signed by:   Larita Fife Via LPN on 16/09/9603   Method used:   [facility]ron[facility]        [facility] [id number]       [address]       [place], [place]  [address]       Ph: [phone] or [phone]       Fax: [phone]   RxID:   [id number]

## 2011-01-26 NOTE — Progress Notes (Signed)
Summary: refill  Phone Note Call from Patient   Summary of Call: needs a refill autherzation on meds. ramipril 5mg    [phone] Initial call taken by: Rudene Anda,  March 16, 2010 4:06 PM    Prescriptions: ALTACE 5 MG  TABS (RAMIPRIL) Take 1 tablet by mouth once a day  #30 Capsule x 3   Entered by:   Adella Hare LPN   Authorized by:   [name] MD   Signed by:   Adella Hare LPN on 62/13/0865   Method used:   Electron[facility] to        [facility] [address]       [address]       [place], [place]  [address]       Ph: [phone] or [phone]       Fax: [phone]   RxID:   [id number]

## 2011-01-26 NOTE — Letter (Signed)
Summary: Clearance Letter  [facility]  [address] [place] [address]   Phone: [phone]  Fax: [phone]    May 11, 2010  Re:     Kerry Flynn Address:   [address]     [place], [place]  [address] DOB:     12-03-1927 MRN:     [id number]   Dear Dr. Simonne Come,    Kerry Flynn is stable from a cardiac standpoint.  Her angina is chronic and has not   changed in intensity or duration.  Stress myoview is negative for ischemia.  She is a low risk for   cardiac complications peri-operatively. She has been seen and examined by Dr. Dietrich Pates as well   during this visit.  He has cleared her to proceed with rotator cuff repair.           Sincerely,  Gladis Riffle, NP

## 2011-01-26 NOTE — Letter (Signed)
Summary: [facility]  [facility]   Imported By: Lind Guest 08/21/2010 16:03:39  _____________________________________________________________________  External Attachment:    Type:   Image     Comment:   External Document

## 2011-01-26 NOTE — Op Note (Signed)
Summary: ROTATOR CUFF  ROTATOR CUFF   Imported By: Lind Guest 08/21/2010 16:02:44  _____________________________________________________________________  External Attachment:    Type:   Image     Comment:   External Document

## 2011-01-28 NOTE — Consult Note (Signed)
Summary: Consultation Report rehman  Consultation Report rehman   Imported By: Lind Guest 01/06/2011 11:13:16  _____________________________________________________________________  External Attachment:    Type:   Image     Comment:   External Document

## 2011-01-28 NOTE — Assessment & Plan Note (Signed)
Summary: office visit   Vital Signs:  Patient profile:   77 year old female Menstrual status:  postmenopausal Height:      66.5 inches Weight:      168 pounds BMI:     26.81 O2 Sat:      99 % Pulse rate:   82 / minute Pulse rhythm:   regular Resp:     16 per minute BP sitting:   144 / 64  (left arm) Cuff size:   regular  Vitals Entered By: Everitt Amber LPN (January 05, 2011 1:40 PM)  Nutrition Counseling: Patient's BMI is greater than 25 and therefore counseled on weight management options. CC: Follow up chronic problems   Primary Care Provider:  [name] MD  CC:  Follow up chronic problems.  History of Present Illness: Pt fell on her steps at home in Dec when the right kneee gave out, has had injections to the knee with some benefit, has been told she needs a replacement but is waiting on this, She reports instability, and is considering getting a lift chair to navigate the steps to her basement. Reports  that she has otherwise been doing well.. Denies recent fever or chills. Denies sinus pressure, nasal congestion , ear pain or sore throat. Denies chest congestion, or cough productive of sputum. Denies chest pain, palpitations, PND, orthopnea or leg swelling. Denies abdominal pain, nausea, vomitting, diarrhea or constipation. Denies change in bowel movements or bloody stool. Denies dysuria , frequency, incontinence or hesitancy. Mamo and pap reportedly up to date. Denies headaches, vertigo, seizures. Denies depression, anxiety or insomnia. Denies  rash, lesions, or itch.     Current Medications (verified): 1)  Colace 100 Mg  Caps (Docusate Sodium) .... Take 1 Tablet By Mouth Four Times A Day 2)  Vitamin D 400 Unit  Tabs (Cholecalciferol) .... Take 2 Tabs Monthly 3)  Prilosec Otc 20 Mg Tbec (Omeprazole Magnesium) .... Take One Tab By Mouth Every Morning 4)  Phillips 500 Mg Tabs (Magnesium Oxide) .... Take 1 Tablet By Mouth Once A Day 5)  Meclizine Hcl 12.5 Mg  Tabs (Meclizine Hcl) .... Take As Needed 6)  Healthy Colon  Caps (Misc Intestinal Flora Regulat) .... Take One Tab By Mouth Once Daily 7)  Aleve 220 Mg Tabs (Naproxen Sodium) .... Take 2 Tabs Two Times A Day 8)  Benefiber  Powd (Wheat Dextrin) .... Use 2 Scoops By Mouth Three Times A Day 9)  Nitrostat 0.4 Mg Subl (Nitroglycerin) .Marland Kitchen.. 1 Tablet Under Tongue At Onset of Chest Pain; You May Repeat Every 5 Minutes For Up To 3 Doses. 10)  Viactiv 500-500-40 Mg-Unt-Mcg Chew (Calcium-Vitamin D-Vitamin K) .... One Chew Two Times A Day 11)  Amlodipine Besylate 5 Mg Tabs (Amlodipine Besylate) .... Take 1 Tablet By Mouth Once A Day 12)  Ramipril 10 Mg Caps (Ramipril) .... Take 1 Capsule By Mouth Once A Day  Allergies (verified): 1)  ! Penicillin  Review of Systems      See HPI General:  Complains of fatigue. Eyes:  Denies discharge, eye pain, and red eye. MS:  Complains of joint pain and stiffness; increased bilateral knee instability, feels unsafe on stepsd, ansrecent injections and braces are hellping, but need re-eval buy ortho. Endo:  Denies cold intolerance, excessive hunger, excessive thirst, and excessive urination. Heme:  Denies abnormal bruising and bleeding. Allergy:  Denies hives or rash and itching eyes.  Physical Exam  General:  Well-developed,well-nourished,in no acute distress; alert,appropriate and cooperative throughout examination HEENT:  No facial asymmetry,  EOMI, No sinus tenderness, TM's Clear, oropharynx  pink and moist.   Chest: Clear to auscultation bilaterally.  CVS: S1, S2, No murmurs, No S3.   Abd: Soft, Nontender.  MS: decreased ROM spine, hips, shoulders and knees.  Ext: No edema.   CNS: CN 2-12 intact, power tone and sensation normal throughout.   Skin: Intact, no visible lesions or rashes.  Psych: Good eye contact, normal affect.  Memory intact, not anxious or depressed appearing.    Impression & Recommendations:  Problem # 1:  DEGENERATIVE JOINT DISEASE,  KNEES, BILATERAL (ICD-715.96) Assessment Deteriorated  Her updated medication list for this problem includes:    Aleve 220 Mg Tabs (Naproxen sodium) .Marland Kitchen... Take 2 tabs two times a day encouraged pt to decide on surgery sooner rather than later due to high fall risk  Problem # 2:  HYPERTENSION (ICD-401.9) Assessment: Improved  Her updated medication list for this problem includes:    Amlodipine Besylate 5 Mg Tabs (Amlodipine besylate) .Marland Kitchen... Take 1 tablet by mouth once a day    Ramipril 10 Mg Caps (Ramipril) .Marland Kitchen... Take 1 capsule by mouth once a day  BP today: 144/64 Prior BP: 150/80 (08/13/2010)  Labs Reviewed: K+: 4.8 (11/18/2010) Creat: : 1.06 (11/18/2010)   Chol: 180 (11/18/2010)   HDL: 64 (11/18/2010)   LDL: 100 (11/18/2010)   TG: 79 (11/18/2010)  Problem # 3:  BARRETTS ESOPHAGUS (ICD-530.85) Assessment: Comment Only recently re-evaluated by gi  Complete Medication List: 1)  Colace 100 Mg Caps (Docusate sodium) .... Take 1 tablet by mouth four times a day 2)  Vitamin D 400 Unit Tabs (Cholecalciferol) .... Take 2 tabs monthly 3)  Prilosec Otc 20 Mg Tbec (Omeprazole magnesium) .... Take one tab by mouth every morning 4)  Phillips 500 Mg Tabs (Magnesium oxide) .... Take 1 tablet by mouth once a day 5)  Meclizine Hcl 12.5 Mg Tabs (Meclizine hcl) .... Take as needed 6)  Healthy Colon Caps (Misc intestinal flora regulat) .... Take one tab by mouth once daily 7)  Aleve 220 Mg Tabs (Naproxen sodium) .... Take 2 tabs two times a day 8)  Benefiber Powd (Wheat dextrin) .... Use 2 scoops by mouth three times a day 9)  Nitrostat 0.4 Mg Subl (Nitroglycerin) .Marland Kitchen.. 1 tablet under tongue at onset of chest pain; you may repeat every 5 minutes for up to 3 doses. 10)  Viactiv 500-500-40 Mg-unt-mcg Chew (Calcium-vitamin d-vitamin k) .... One chew two times a day 11)  Amlodipine Besylate 5 Mg Tabs (Amlodipine besylate) .... Take 1 tablet by mouth once a day 12)  Ramipril 10 Mg Caps (Ramipril) ....  Take 1 capsule by mouth once a day  Patient Instructions: 1)  Follow up appointment in 5.29months 2)  Pls consider calling the orthopedic doctor soon regarding  the instability in your knees, I do not want you to fall. 3)  Nojmed changes at this time   Orders Added: 1)  Est. Patient Level IV [91478]

## 2011-02-08 ENCOUNTER — Telehealth: Payer: Self-pay | Admitting: Family Medicine

## 2011-02-11 ENCOUNTER — Encounter: Payer: Self-pay | Admitting: Family Medicine

## 2011-02-17 NOTE — Letter (Signed)
Summary: fax for the stair lift device  fax for the stair lift device   Imported By: Luann Bullins 02/11/2011 15:50:42  _____________________________________________________________________  External Attachment:    Type:   Image     Comment:   External Document

## 2011-02-17 NOTE — Letter (Signed)
Summary: stair lift device  stair lift device   Imported By: Lind Guest 02/11/2011 15:50:02  _____________________________________________________________________  External Attachment:    Type:   Image     Comment:   External Document

## 2011-02-17 NOTE — Progress Notes (Signed)
Summary: speak with nurse  Phone Note Call from Patient   Summary of Call: pt needs to speak with nurse about getting a stairlift put in. [phone] Initial call taken by: Rudene Anda,  February 08, 2011 2:43 PM  Follow-up for Phone Call        needs statement on letterhead stating she does have a medical need for this, they will give her a discount if she provides the letter  fax to [employer] [phone] Follow-up by: Adella Hare LPN,  February 09, 2011 8:50 AM  Additional Follow-up for Phone Call Additional follow up Details #1::        pls type letter stating she needs this on medical grounds due to severe degebrative joint disease wth high risk of falls, and increasing difficulty climbing stairs. I wil signl  Additional Follow-up by: [name] MD,  February 09, 2011 9:34 PM    Additional Follow-up for Phone Call Additional follow up Details #2::    Letter typed and ready to sign  Follow-up by: Everitt Amber LPN,  February 10, 2011 11:32 AM

## 2011-02-19 ENCOUNTER — Encounter: Payer: Self-pay | Admitting: Family Medicine

## 2011-02-23 NOTE — Letter (Signed)
Summary: 2nd fax  [employer] stairlift  2nd fax  [employer] stairlift   Imported By: Lind Guest 02/19/2011 14:20:37  _____________________________________________________________________  External Attachment:    Type:   Image     Comment:   External Document

## 2011-03-15 LAB — BASIC METABOLIC PANEL
Calcium: 9.5 mg/dL (ref 8.4–10.5)
Creatinine, Ser: 0.83 mg/dL (ref 0.4–1.2)
GFR calc Af Amer: >60 mL/min (ref >60)
GFR calc non Af Amer: >60 mL/min (ref >60)
Sodium: 135 mEq/L (ref 135–145)

## 2011-03-15 LAB — CBC
MCHC: 33.7 g/dL (ref 30.0–36.0)
MCV: 94.2 fL (ref 78.0–100.0)
Platelets: 178 10*3/uL (ref 150–400)
RBC: 3.64 MIL/uL — ABNORMAL LOW (ref 3.87–5.11)
RDW: 12.8 % (ref 11.5–15.5)

## 2011-04-09 ENCOUNTER — Other Ambulatory Visit: Payer: Self-pay | Admitting: Family Medicine

## 2011-05-14 NOTE — Op Note (Signed)
NAME:  Kerry Flynn, Kerry Flynn              ACCOUNT NO.:  [id number]   MEDICAL RECORD NO.:  [id number]          PATIENT TYPE:  AMB   LOCATION:  DAY                           FACILITY:  [facility]   PHYSICIAN:  Jerolyn Shin C. Katrinka Blazing, M.D.   DATE OF BIRTH:  06/08/1927   DATE OF PROCEDURE:  06/17/2006  DATE OF DISCHARGE:                                 OPERATIVE REPORT   PREOPERATIVE DIAGNOSES:  1.  Cholelithiasis.  2.  Cholecystitis.   POSTOPERATIVE DIAGNOSES:  1.  Cholelithiasis.  2.  Cholecystitis.   PROCEDURE:  Laparoscopic cholecystectomy.   SURGEON:  Dirk Dress. Katrinka Blazing, M.D.   DESCRIPTION:  Under general anesthesia, the patient's abdomen was prepped  and draped in a sterile field.  A supraumbilical incision was made.  A  Veress needle was inserted.  Abdomen was insufflated with 3L of CO2.  Using  a Visiport guide, a 10 mm port was placed.  Laparoscope was placed.  Gallbladder was visualized.  The patient had a very long redundant  gallbladder on a long mesentery.  Under videoscopic guidance, a 10 mm port  and two 5 mm ports were placed in the right subcostal region.  The  gallbladder was grasped and positioned.  Adhesions to the gallbladder were  taken down using the electrocautery and blunt dissection.  The gallbladder  was in a very long mesentery.  The cystic duct was very long.  Dissection of  the cystic duct and cystic artery was started close to the gallbladder and  was dissected about half the length of the duct and vessel to make sure that  there were no branches coming off except going to the gallbladder.  Once  this was confirmed, the cystic artery was clamped on the wall of the  gallbladder with 4 clamps and divided.  Cystic artery was clamped with 5  clamps and divided.  The long mesentery was then dissected with the  electrocautery close to the gallbladder.  The gallbladder was then removed.  Hemostasis was achieved.  Irrigation was carried out.  There was minimal  blood loss and there  was no evidence of bile leak.  CO2 collection, the  gallbladder was placed in an EndoCatch device and retrieved.  Inspection of  the bed revealed no other abnormalities.  CO2 was allowed to escape from the  abdomen and the ports were removed.  The  incisions were then closed using 0 Dexon on the fascia at the umbilicus with  staples on the skin.  Patient was awakened from anesthesia uneventfully,  transferred to a bed and taken to the Post Anesthetic Care Unit for  monitoring.      Dirk Dress. Katrinka Blazing, M.D.  Electronically Signed     LCS/MEDQ  D:  06/17/2006  T:  06/17/2006  Job:  [id number]   cc:   Milus Mallick. Lodema Hong, M.D.  Fax: [phone]

## 2011-05-14 NOTE — Op Note (Signed)
NAME:  Kerry Flynn, Kerry Flynn              ACCOUNT NO.:  [id number]   MEDICAL RECORD NO.:  [id number]          PATIENT TYPE:  AMB   LOCATION:  DAY                           FACILITY:  [facility]   PHYSICIAN:  Lionel December, M.D.    DATE OF BIRTH:  1927-08-27   DATE OF PROCEDURE:  12/12/2006  DATE OF DISCHARGE:                               OPERATIVE REPORT   PROCEDURE:  Esophagogastroduodenoscopy followed by colonoscopy.   INDICATIONS:  Kerry Flynn is 77 year old Caucasian female who has had symptoms  of GERD for over 10 years.  She is undergoing EGD to make sure she does  not have Barrett's esophagus.  She has history of colonic polyps.  She  had a 10 mm adenoma removed from her colon in December 2000.  She had a  follow-up exam December 2002 which was negative.  Both procedure and  risks were reviewed the patient, informed consent was obtained.   MEDICATIONS FOR CONSCIOUS SEDATION:  Benzocaine spray for pharyngeal  topical anesthesia, Demerol 50 mg IV and Versed 6 mg IV.   FINDINGS:  Procedure #1 - Esophagogastroduodenoscopy:  The patient was  placed left lateral recumbent position and Pentax videoscope was passed  per oropharynx without any difficulty into esophagus.   Esophagus:  Mucosa of the esophagus was normal.  GE junction was rather  irregular or wavy, located at 38 cm from the incisors.  Pictures taken  of this area followed by biopsy on the way out to make sure she does not  have a short segment Barrett's.   Stomach:  It was empty and distended very well with insufflation.  Folds  of proximal stomach were normal.  Examination of mucosa revealed some  antral erythema but no erosions or ulcers were noted.  Pyloric channel  was patent.  Angularis, fundus and cardia were examined by retroflexing  the scope and were normal.   Duodenum:  Bulbar mucosa was normal.  Scope was passed in second part of  duodenum where mucosa and folds were normal.  Endoscope was withdrawn  and the patient  prepared for procedure #2.   Procedure #2 - Colonoscopy:  Rectal examination performed.  No  abnormality noted external or digital exam.  Pentax videoscope was  placed in rectum and advanced under vision into sigmoid colon and  beyond.  Preparation was satisfactory.  Multiple diverticula were noted  in sigmoid colon with few more at descending colon and proximal to  splenic flexure.  Scope was passed to the cecum which was identified by  appendiceal orifice and ileocecal valve.  Pictures taken for the record.  As the scope was withdrawn, colonic mucosa was carefully examined and  was normal throughout.  Rectal mucosa similarly was normal.  Scope was  retroflexed to examine anorectal junction which was unremarkable.  Endoscope was straightened and withdrawn.  The patient tolerated the  procedure well.   FINAL DIAGNOSES:  1. Serrated or irregular gastroesophageal junction which was biopsied      to rule out Barrett's.  2. No evidence of hiatal hernia or peptic ulcer disease.  Antral      erythema  felt to be nonspecific.  Please note that she has been      treated for Helicobacter pylori gastritis about 2 years ago.  3. Pan colonic diverticulosis.  Most of the diverticula are at sigmoid      colon (moderate number).  4. No evidence of recurrent polyps.   RECOMMENDATIONS:  1. She will continue antireflux measures and Zegerid as before, which      was 40 mg every other day.  2. High-fiber diet.  3. I will be contacting the patient with results of biopsy and further      recommendations.  4. She may consider next surveillance colonoscopy with results of      biopsy and further recommendations.      Lionel December, M.D.  Electronically Signed     NR/MEDQ  D:  12/12/2006  T:  12/12/2006  Job:  [id number]   cc:   Milus Mallick. Lodema Hong, M.D.  Fax: [phone]

## 2011-05-14 NOTE — Consult Note (Signed)
NAME:  Kerry Flynn, Kerry Flynn              ACCOUNT NO.:  [id number]   MEDICAL RECORD NO.:  [id number]            PATIENT TYPE:   LOCATION:                                 FACILITY:   PHYSICIAN:  Lionel December, M.D.    DATE OF BIRTH:  Sep 21, 1927   DATE OF CONSULTATION:  11/03/2006  DATE OF DISCHARGE:                                   CONSULTATION   PRESENTING COMPLAINT:  Follow up for GERD and history of polyps.   HISTORY OF PRESENT ILLNESS:  Kerry Flynn is a 77 year old Caucasian female who is  referred through the courtesy of Dr. Lodema Hong for GI evaluation.  She has a  several year history of GERD.  She has never had her upper GI tract  evaluated.  Dr. Lodema Hong recommended that she should undergo EGD.  The  patient states that she rarely experiences heartburn, but every now and then  she may have regurgitation with certain foods; and she has controlled  symptoms intermittently.  She feels that she has a scratch in her throat,  and needs to clear her throat.  She denies dysphagia.  She also denies  abdominal pain, anorexia, or weight loss.  She denies melena or frank  bleeding.  She has hematochezia, felt to be secondary to hemorrhoids.  She  has history of colonic polyps.  She had a 10-mm adenoma removed in December  2003.  Her colonoscopy in December 2002 was negative for residual of  recurrent polyp and she had a few diverticula scattered through her colon.  She is due for her surveillance colonoscopy.   REVIEW OF THE SYSTEMS:  Positive for arthralgias, particularly involving the  joints of her lower extremities.  She also has arthritis involving her  cervical spine.   MEDICATIONS:  1. Altace 5 mg daily.  2. Norvasc 5 mg daily.  3. Ibuprofen 400 mg with breakfast and evening meal and 600 mg with lunch.  4. Zegerid 40 mg q.o.d.  5. MVI daily.  6. Colace 100 mg b.i.d.  7. Citrucel 1 tablespoonful daily.  8. Vioxx daily.   PAST MEDICAL HISTORY:  Medical problems include:  1. Hypertension.  2  Osteoarthrosis.  1. Chronic GERD.  2. She was treated for H. pylori infection 2 years ago by Dr. Lodema Hong.  3. History of colonic polyps as above.  4. Coronary artery disease.  She had noncritical disease on cardiac cath      done September 2006.  She also had in his insignificant carotid artery      disease involving both internal carotids.  5. She had tonsillectomy at age [age].  6. Cholecystectomy in June 2007 for symptomatic cholelithiasis.   ALLERGIES:  To PENICILLIN, BIAXIN, FLAGYL, and she is intolerant of  PREVACID.   FAMILY HISTORY:  Father was treated for laryngeal carcinoma at age 10 and  died at 63 of old age.  Mother had heart disease and CVA and died at 12.  She had a sister who died of Parkinson's disease at age 39.   SOCIAL HISTORY:  She is married, but does not have any children.  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  She smoked  cigarettes for less than a year, but quit in 1963.  She drinks alcohol  occasionally.   PHYSICAL EXAMINATION:  GENERAL:  A pleasant well-developed, well-nourished  Caucasian female who is in no acute distress.  VITAL SIGNS:  She weighs 166 pounds.  She is 5 feet 7 inches tall.  Pulse 78  per minute, blood pressure 142/64, temperature is 97.7.  HEENT:  Conjunctivae are pink.  Sclerae are nonicteric.  Oropharyngeal  mucosa is normal.  NECK:  No neck masses are noted.  Carotids are 2+ bilaterally without  bruits.  CARDIAC EXAM:  Regular rhythm.  Normal S1-S2.  No murmur or gallop noted.  LUNGS:  Clear to auscultation.  ABDOMEN:  Symmetrical, soft, and nontender without organomegaly or masses.  RECTAL EXAMINATION:  Deferred.  She had one earlier this year, at the time  of pelvic exam with guaiac negative stools.  EXTREMITIES:  No peripheral edema or clubbing noted.   ASSESSMENT:  1. Kerry Flynn has chronic gastroesophageal reflux disease with symptoms for more      than 10 years.  Her typical symptoms are  well-controlled, but she does      have some atypical symptoms.  She has never had esophageal mucosa      examined; and I agree with Dr. Lodema Hong that this needs to be done to      rule out Barrett's esophagus.  If indeed she has evidence of erosive      ulcerative esophagitis, her therapy may have to be changed to every day      rather than q.o.d.  2. History of colonic polyp.  She is due for her surveillance exam since      the last exam was 5 years ago.   RECOMMENDATIONS:  1. She will stay on her Zegerid at 40 mg q.o.d. for the time being.  We      give her samples of Zegerid capsules which she would like to try      instead of taking powder.  2. She will undergo esophagogastroduodenoscopy and colonoscopy at [facility] in      the near future.  I have reviewed both the procedures risks with the      patient and she is agreeable.   We would like to thank Dr. Lodema Hong for the opportunity to participate in the  care of this nice lady.      Lionel December, M.D.  Electronically Signed     NR/MEDQ  D:  11/03/2006  T:  11/04/2006  Job:  [id number]   cc:   Milus Mallick. Lodema Hong, M.D.  Fax: [phone]   [facility]  Fax: [phone]

## 2011-05-14 NOTE — Procedures (Signed)
NAME:  Kerry Flynn, Kerry Flynn              ACCOUNT NO.:  [id number]   MEDICAL RECORD NO.:  [id number]          PATIENT TYPE:  OUT   LOCATION:  RESP                          FACILITY:  [facility]   PHYSICIAN:  Edward L. Juanetta Gosling, M.D.DATE OF BIRTH:  Mar 20, 1927   DATE OF PROCEDURE:  09/29/2005  DATE OF DISCHARGE:                              PULMONARY FUNCTION TEST   RESULTS:  1.  Spirometry shows no definite ventilatory defect, but with a decrease in      the FEV-1 to FVC ratio suggesting some airflow obstruction. 2.  Lung      volumes are normal.  2.  Diffusion capacity of carbon monoxide (DLCO) is mildly reduced.  3.  Arterial blood gases are normal.      Edward L. Juanetta Gosling, M.D.  Electronically Signed     ELH/MEDQ  D:  09/30/2005  T:  10/01/2005  Job:  [id number]

## 2011-05-14 NOTE — H&P (Signed)
NAME:  Kerry Flynn, MCKIBBIN              ACCOUNT NO.:  [id number]   MEDICAL RECORD NO.:  [id number]          PATIENT TYPE:  AMB   LOCATION:  DAY                           FACILITY:  [facility]   PHYSICIAN:  Jerolyn Shin C. Katrinka Blazing, M.D.   DATE OF BIRTH:  12-Jun-1927   DATE OF ADMISSION:  DATE OF DISCHARGE:  LH                                HISTORY & PHYSICAL   A 77 year old female with history of chronic abdominal pain, with  postprandial nausea without vomiting, increased bloating with all foods,  with diagnostic evaluation showing gallstones. The patient is scheduled for  laparoscopic cholecystectomy.   PAST HISTORY:  She has osteoarthritis, hypertension, diverticulosis, and  vertigo.   ALLERGIES:  Possible adverse reaction to AMITIZA. Other allergies are  PENICILLIN, FLAGYL, and BIAXIN.   PAST SURGICAL HISTORY:  Arthroscopic surgery to both knees and  tonsillectomy. The patient had evaluation with heart catheterization showing  20% nonobstructive region of the left anterior descending. She had a carotid  evaluation which showed normal carotid arteries bilaterally.   MEDICATIONS:  1.  Norvasc 5 mg daily.  2.  Altace 5 mg daily.  3.  Lodine 400 mg b.i.d.  4.  Zegerid one every other day.  5.  Colace b.i.d.   PHYSICAL EXAMINATION:  VITAL SIGNS:  On exam, blood pressure 140/72, pulse  60, respirations 20, weight 168 pounds.  HEENT:  Unremarkable.  NECK:  Supple with no JVD, bruit, adenopathy, or thyromegaly.  CHEST:  Clear to auscultation.  HEART:  Regular rate and rhythm without murmurs, rubs, or gallops.  ABDOMEN:  Soft and nontender with no masses.  EXTREMITIES:  Increased varicosities. No edema.  NEUROLOGIC:  No focal motor, sensory, or cerebellar deficits.   IMPRESSION:  1.  Cholelithiasis.  2.  Cholecystitis.  3.  Hypertension.  4.  Nonobstructive atherosclerotic heart disease.   PLAN:  Laparoscopic cholecystectomy.      Dirk Dress. Katrinka Blazing, M.D.  Electronically Signed     LCS/MEDQ  D:  06/16/2006  T:  06/16/2006  Job:  [id number]

## 2011-06-16 ENCOUNTER — Encounter: Payer: Self-pay | Admitting: Family Medicine

## 2011-06-25 ENCOUNTER — Encounter: Payer: Self-pay | Admitting: Family Medicine

## 2011-06-29 ENCOUNTER — Ambulatory Visit (INDEPENDENT_AMBULATORY_CARE_PROVIDER_SITE_OTHER): Payer: Medicare Other | Admitting: Family Medicine

## 2011-06-29 ENCOUNTER — Encounter: Payer: Self-pay | Admitting: Family Medicine

## 2011-06-29 VITALS — BP 150/70 | HR 65 | Resp 16 | Ht 66.75 in | Wt 163.1 lb

## 2011-06-29 DIAGNOSIS — I10 Essential (primary) hypertension: Secondary | ICD-10-CM

## 2011-06-29 DIAGNOSIS — N3090 Cystitis, unspecified without hematuria: Secondary | ICD-10-CM | POA: Insufficient documentation

## 2011-06-29 DIAGNOSIS — M25561 Pain in right knee: Secondary | ICD-10-CM

## 2011-06-29 DIAGNOSIS — K2270 Barrett's esophagus without dysplasia: Secondary | ICD-10-CM

## 2011-06-29 DIAGNOSIS — R5383 Other fatigue: Secondary | ICD-10-CM

## 2011-06-29 DIAGNOSIS — Z13820 Encounter for screening for osteoporosis: Secondary | ICD-10-CM

## 2011-06-29 DIAGNOSIS — M25569 Pain in unspecified knee: Secondary | ICD-10-CM

## 2011-06-29 DIAGNOSIS — R5381 Other malaise: Secondary | ICD-10-CM

## 2011-06-29 DIAGNOSIS — I2510 Atherosclerotic heart disease of native coronary artery without angina pectoris: Secondary | ICD-10-CM

## 2011-06-29 DIAGNOSIS — Z13220 Encounter for screening for lipoid disorders: Secondary | ICD-10-CM

## 2011-06-29 LAB — POCT URINALYSIS DIPSTICK
Bilirubin, UA: NEGATIVE
Glucose, UA: NEGATIVE
Leukocytes, UA: NEGATIVE
Nitrite, UA: NEGATIVE

## 2011-06-29 MED ORDER — METHOCARBAMOL 500 MG PO TABS: ORAL_TABLET | ORAL | Status: DC

## 2011-06-29 NOTE — Assessment & Plan Note (Signed)
Deteriorated will see orthopedic doc next week

## 2011-06-29 NOTE — Patient Instructions (Addendum)
Fasting labs end November. We will check your urine today based on your symptoms.  Pls continue your current med

## 2011-06-29 NOTE — Progress Notes (Signed)
  Subjective:    Patient ID: Kerry Flynn, female    DOB: 03/11/1927, 77 y.o.   MRN: [id number]  HPI Pt reports excessive and uncontrolled right knee pain, requests robaxin and will see orthopod next week. She reports excellent success from right shoulder surgery. She c/o nocturia for weeks, at times she has the urge but not much urine is voided, she does not have dysuria. Appetite is good,  Pt is concerned about a recent letter form the ins recommending statin use, does not want this concerned about muscle aches and memory loss potentially, I explained due to her CAD dx her LDL should be less than 100, and she needs to work at this. Though Kerry Flynn has concerns about osteoperosis she does not want medication , states due to Barret's and no interest in the injectable medication    Review of Systems Denies recent fever or chills. Denies sinus pressure, nasal congestion, ear pain or sore throat. Denies chest congestion, productive cough or wheezing. Denies chest pains, palpitations, paroxysmal nocturnal dyspnea, orthopnea and leg swelling Denies abdominal pain, nausea, vomiting,diarrhea   Denies headaches, seizure, numbness, or tingling. Denies depression, anxiety or insomnia. Denies skin break down or rash.        Objective:   Physical Exam Patient alert and oriented and in no Cardiopulmonary distress.  HEENT: No facial asymmetry, EOMI, no sinus tenderness, TM's clear, Oropharynx pink and moist.  Neck decreased ROM, no adenopathy.  Chest: Clear to auscultation bilaterally.  CVS: S1, S2 no murmurs, no S3.  ABD: Soft non tender. Bowel sounds normal.  Ext: No edema  Kerry: decreased  ROM spine, shoulders, hips and knees.  Skin: Intact, no ulcerations or rash noted.  Psych: Good eye contact, normal affect. Memory intact not anxious or depressed appearing.  CNS: CN 2-12 intact, power, tone and sensation normal throughout.        Assessment & Plan:

## 2011-06-29 NOTE — Assessment & Plan Note (Addendum)
Uncontrolled , no change in medication pt has been intolerant of higher med dose

## 2011-06-29 NOTE — Assessment & Plan Note (Signed)
Urinalysis is normal, pt reassured no evidence of infection

## 2011-06-29 NOTE — Assessment & Plan Note (Signed)
Has appt with GI in December for upper and lower endoscopy, symptoms currently controlled on medication

## 2011-06-29 NOTE — Assessment & Plan Note (Signed)
notes some exertional fatigue, denies chest pain, neg stress test  2 years ago

## 2011-07-02 ENCOUNTER — Ambulatory Visit: Payer: Self-pay | Admitting: Family Medicine

## 2011-07-05 ENCOUNTER — Other Ambulatory Visit: Payer: Self-pay | Admitting: Family Medicine

## 2011-09-29 ENCOUNTER — Other Ambulatory Visit: Payer: Self-pay | Admitting: Family Medicine

## 2011-11-11 ENCOUNTER — Other Ambulatory Visit: Payer: Self-pay | Admitting: Family Medicine

## 2011-11-12 LAB — CBC WITH DIFFERENTIAL/PLATELET
Basophils Absolute: 0 10*3/uL (ref 0.0–0.1)
Eosinophils Relative: 5 % (ref 0–5)
Lymphocytes Relative: 17 % (ref 12–46)
MCV: 95.8 fL (ref 78.0–100.0)
Neutrophils Relative %: 67 % (ref 43–77)
Platelets: 184 10*3/uL (ref 150–400)
RBC: 3.83 MIL/uL — ABNORMAL LOW (ref 3.87–5.11)
RDW: 12.8 % (ref 11.5–15.5)
WBC: 5.7 10*3/uL (ref 4.0–10.5)

## 2011-11-12 LAB — BASIC METABOLIC PANEL
Calcium: 10.1 mg/dL (ref 8.4–10.5)
Chloride: 101 mEq/L (ref 96–112)
Creat: 0.98 mg/dL (ref 0.50–1.10)
Sodium: 137 mEq/L (ref 135–145)

## 2011-11-12 LAB — LIPID PANEL
HDL: 67 mg/dL (ref >39)
LDL Cholesterol: 93 mg/dL (ref 0–99)
Total CHOL/HDL Ratio: 2.6 Ratio
Triglycerides: 86 mg/dL (ref <150)

## 2011-11-12 LAB — TSH: TSH: 4.912 u[IU]/mL — ABNORMAL HIGH (ref 0.350–4.500)

## 2011-11-15 ENCOUNTER — Encounter: Payer: Self-pay | Admitting: Family Medicine

## 2011-11-16 LAB — T3, FREE: T3, Free: 3.4 pg/mL (ref 2.3–4.2)

## 2011-11-16 LAB — IRON: Iron: 83 ug/dL (ref 42–145)

## 2011-11-22 ENCOUNTER — Telehealth: Payer: Self-pay | Admitting: Family Medicine

## 2011-11-22 ENCOUNTER — Ambulatory Visit (INDEPENDENT_AMBULATORY_CARE_PROVIDER_SITE_OTHER): Payer: Medicare Other | Admitting: Family Medicine

## 2011-11-22 ENCOUNTER — Encounter: Payer: Self-pay | Admitting: Family Medicine

## 2011-11-22 VITALS — BP 148/70 | HR 65 | Resp 16 | Ht 66.75 in | Wt 164.0 lb

## 2011-11-22 DIAGNOSIS — M1710 Unilateral primary osteoarthritis, unspecified knee: Secondary | ICD-10-CM

## 2011-11-22 DIAGNOSIS — IMO0002 Reserved for concepts with insufficient information to code with codable children: Secondary | ICD-10-CM

## 2011-11-22 DIAGNOSIS — I2510 Atherosclerotic heart disease of native coronary artery without angina pectoris: Secondary | ICD-10-CM

## 2011-11-22 DIAGNOSIS — I6529 Occlusion and stenosis of unspecified carotid artery: Secondary | ICD-10-CM

## 2011-11-22 DIAGNOSIS — R0789 Other chest pain: Secondary | ICD-10-CM

## 2011-11-22 DIAGNOSIS — Z23 Encounter for immunization: Secondary | ICD-10-CM

## 2011-11-22 DIAGNOSIS — R7989 Other specified abnormal findings of blood chemistry: Secondary | ICD-10-CM | POA: Insufficient documentation

## 2011-11-22 DIAGNOSIS — I10 Essential (primary) hypertension: Secondary | ICD-10-CM

## 2011-11-22 DIAGNOSIS — R6889 Other general symptoms and signs: Secondary | ICD-10-CM

## 2011-11-22 DIAGNOSIS — K2270 Barrett's esophagus without dysplasia: Secondary | ICD-10-CM

## 2011-11-22 NOTE — Progress Notes (Signed)
  Subjective:    Patient ID: Kerry Flynn, female    DOB: June 25, 1927, 77 y.o.   MRN: [id number]  HPI The PT is here for follow up and re-evaluation of chronic medical conditions, medication management and review of any available recent lab and radiology data.  Preventive health is updated, specifically  Cancer screening and Immunization.   Questions or concerns regarding consultations or procedures which the PT has had in the interim are  addressed. The PT denies any adverse reactions to current medications since the last visit.  Pt requests referral to GI as she believes it is time for rept eval for Barret's disease, and also probably  a colonscopy. Chronic knee pain and stiffness continue, no recent falls. Reports increased fatigue with exertion in the past 3 months, requests cardiology eval, has also had upper abdominal tightness in the recent past    Review of Systems See HPI Denies recent fever or chills. Denies sinus pressure, nasal congestion, ear pain or sore throat. Denies chest congestion, productive cough or wheezing. Denies palpitations and leg swelling    Denies dysuria, frequency, hesitancy or incontinence. Denies headaches, seizures, numbness, or tingling. Denies depression, anxiety or insomnia. Denies skin break down or rash.        Objective:   Physical Exam Patient alert and oriented and in no cardiopulmonary distress.  HEENT: No facial asymmetry, EOMI, no sinus tenderness,  oropharynx pink and moist.  Neck decrased ROM, no adenopathy.Bruit  Chest: Clear to auscultation bilaterally.  CVS: S1, S2 no murmurs, no S3.  ABD: Soft non tender.  Ext: No edema  MS: decreased  ROM spine, shoulders, hips and knees.  Skin: Intact, no ulcerations or rash noted.  Psych: Good eye contact, normal affect. Memory intact not anxious or depressed appearing.  CNS: CN 2-12 intact, power, tone and sensation normal throughout.        Assessment & Plan:

## 2011-11-22 NOTE — Patient Instructions (Addendum)
F/u in 6 months  You are refered for carotid doppler and to cardiologist(Dr Allyson Sabal), also for thyroid US to evaluate nodules, and also to dr Karilyn Cota  Cholesterol is great.Also your blood pressure is slightly elevated.  pls start regular exercise and cut down on salt  Flu vaccine  Today.  TSH in 6 months  Eye exam to be scheduled by you

## 2011-11-22 NOTE — Assessment & Plan Note (Signed)
Thyroid nodules and abn tsh, needs thyroid US

## 2011-11-25 ENCOUNTER — Ambulatory Visit (HOSPITAL_COMMUNITY)
Admission: RE | Admit: 2011-11-25 | Discharge: 2011-11-25 | Disposition: A | Discharge status: Home / Self Care | Payer: Medicare Other | Source: Ambulatory Visit | Attending: Family Medicine | Admitting: Family Medicine

## 2011-11-25 ENCOUNTER — Other Ambulatory Visit (HOSPITAL_COMMUNITY): Payer: Medicare Other

## 2011-11-25 DIAGNOSIS — I6529 Occlusion and stenosis of unspecified carotid artery: Secondary | ICD-10-CM

## 2011-11-25 DIAGNOSIS — E349 Endocrine disorder, unspecified: Secondary | ICD-10-CM | POA: Insufficient documentation

## 2011-11-25 DIAGNOSIS — E041 Nontoxic single thyroid nodule: Secondary | ICD-10-CM | POA: Insufficient documentation

## 2011-11-25 DIAGNOSIS — E049 Nontoxic goiter, unspecified: Secondary | ICD-10-CM | POA: Insufficient documentation

## 2011-11-25 DIAGNOSIS — R7989 Other specified abnormal findings of blood chemistry: Secondary | ICD-10-CM

## 2011-11-28 NOTE — Assessment & Plan Note (Signed)
Chronic pain continues, no h/o falls

## 2011-11-28 NOTE — Assessment & Plan Note (Signed)
Uncontrolled currently however pt has been intolerant of higher doses of medication in the past, will follow for the next 6 months at current level with emphasis on lifestyle change. If this is succesful then no need to inc dose which I will attempt gently

## 2011-11-28 NOTE — Assessment & Plan Note (Signed)
Asymptomatic as far as symptoms of dysphagia or reflux on chronic med. States her upper endoscopy is upcoming , so will refer to GI for this. Also concerned about colon.

## 2011-11-28 NOTE — Assessment & Plan Note (Signed)
Recent increased exertional fatigue with upper abdominal tightness, will refer to cardiology for re eval, pt requests [place]

## 2011-12-02 ENCOUNTER — Encounter (INDEPENDENT_AMBULATORY_CARE_PROVIDER_SITE_OTHER): Payer: Self-pay | Admitting: *Deleted

## 2011-12-02 ENCOUNTER — Ambulatory Visit (INDEPENDENT_AMBULATORY_CARE_PROVIDER_SITE_OTHER): Payer: 59 | Admitting: Internal Medicine

## 2011-12-02 ENCOUNTER — Encounter (INDEPENDENT_AMBULATORY_CARE_PROVIDER_SITE_OTHER): Payer: Self-pay | Admitting: Internal Medicine

## 2011-12-02 ENCOUNTER — Telehealth (INDEPENDENT_AMBULATORY_CARE_PROVIDER_SITE_OTHER): Payer: Self-pay | Admitting: *Deleted

## 2011-12-02 ENCOUNTER — Other Ambulatory Visit (INDEPENDENT_AMBULATORY_CARE_PROVIDER_SITE_OTHER): Payer: Self-pay | Admitting: *Deleted

## 2011-12-02 VITALS — BP 130/58 | HR 72 | Temp 97.0°F | Ht 66.5 in | Wt 161.9 lb

## 2011-12-02 DIAGNOSIS — K2270 Barrett's esophagus without dysplasia: Secondary | ICD-10-CM

## 2011-12-02 DIAGNOSIS — Z86010 Personal history of colonic polyps: Secondary | ICD-10-CM

## 2011-12-02 DIAGNOSIS — D126 Benign neoplasm of colon, unspecified: Secondary | ICD-10-CM

## 2011-12-02 NOTE — Patient Instructions (Addendum)
Will schedule surveillance  EGD/Colonoscopy with Dr. Karilyn Cota.

## 2011-12-02 NOTE — Telephone Encounter (Signed)
Patient needs movi prep 

## 2011-12-02 NOTE — Progress Notes (Signed)
Subjective:     Patient ID: Kerry Flynn, female   DOB: [birth year], 77 y.o.   MRN: [id number]  HPI  Kerry Flynn is a 77 yr old female here today for f/u of her Barrett's esophagus.  She also has a hx of 10mm adenoma which was removed in 2000.  She does have frequent acid reflux. Her reflux is worse at night.  Appetite is good. No weight loss. No abdominal pain. She does have occasionally gas pain. She has a BM 1-2 a day with a stool softner.No melena or bright red rectal bleeding Hx of Barrett's diagnosed in 2007. She is due for surveillance. Also is due for a surveillance colonoscopy. Hx of a 10 mm adenoma removed in 2000.   Hx of short segment Barrett's esophagus.  EGD/Colonosocpy on December 2007 and was diagnosed with short segment Barrett's esophagus. No dysplasia.  She  1. Serrated or irregular gastroesophageal junction which was biopsied  to rule out Barrett's.  2. No evidence of hiatal hernia or peptic ulcer disease. Antral  erythema felt to be nonspecific. Please note that she has been  treated for Helicobacter pylori gastritis about 2 years ago.  3. Pan colonic diverticulosis. Most of the diverticula are at sigmoid  colon (moderate number).  4. No evidence of recurrent polyps.  Review of Systems see hpi     Current Outpatient Prescriptions  Medication Sig Dispense Refill  . amLODipine (NORVASC) 5 MG tablet TAKE 1 TABLET BY MOUTH ONCE DAILY  30 tablet  2  . docusate sodium (COLACE) 100 MG capsule Take 100 mg by mouth 3 (three) times daily.        . ergocalciferol (VITAMIN D2) 50000 UNITS capsule Take 50,000 Units by mouth once a week.       . Magnesium Oxide (PHILLIPS) 500 MG (LAX) TABS Take by mouth. Take 2 tablets every other day       . meclizine (ANTIVERT) 12.5 MG tablet Take 12.5 mg by mouth as needed.        . methocarbamol (ROBAXIN) 500 MG tablet One tablet twice daily as needed for knee pain  60 tablet  2  . Multiple Vitamin (MULTIVITAMIN) tablet Take 1 tablet by mouth daily.         . naproxen sodium (ANAPROX) 220 MG tablet Take 220 mg by mouth 2 (two) times daily with meals. Take 2 tablets 2 times a day       . nitroGLYCERIN (NITROSTAT) 0.4 MG SL tablet Place 0.4 mg under the tongue every 5 (five) minutes as needed. 1 tablet under the tongue at onset of chest pain: you may repeat every 5 minutes for up to 3 doses      . omeprazole (PRILOSEC OTC) 20 MG tablet Take 20 mg by mouth every morning.       . Probiotic Product (HEALTHY COLON) CAPS Take by mouth daily.       . ramipril (ALTACE) 10 MG capsule TAKE ONE CAPSULE BY MOUTH EVERY DAY  30 capsule  2  . Wheat Dextrin (BENEFIBER) POWD Take by mouth 3 (three) times daily. Use 2 scoops by mouth 3 times a day        Past Medical History  Diagnosis Date  . Hemorrhoids   . Intermittent vertigo   . Osteoporosis     knees  . Benign recurrent vertigo   . Vitamin D deficiency   . Head trauma     status post fall; upper and lower extremities   .  GERD (gastroesophageal reflux disease)   . Diverticulosis   . Chronic constipation   . Osteoarthritis     of the neck  . Dermatitis     recurrent  . Hypertension    Past Surgical History  Procedure Date  . Knee arthroscopy 1999    rt  . Knee arthroscopy 1996    left   . Cholecystectomy 2007  . Rotator cuff repair May 14 2010   Family Status  Relation Status Death Age  . Sister Deceased     parkinson  . Father Deceased   . Mother Deceased    .soc History   Social History  . Marital Status: Married    Spouse Name: N/A    Number of Children: N/A  . Years of Education: N/A   Occupational History  . retired    Social History Main Topics  . Smoking status: Former Games developer  . Smokeless tobacco: Not on file  . Alcohol Use: No  . Drug Use: No  . Sexually Active: Not on file   Other Topics Concern  . Not on file   Social History Narrative  . No narrative on file   History   Social History Narrative  . No narrative on file   Allergies  Allergen  Reactions  . Biaxin   . Flagyl (Metronidazole)   . Penicillins        Objective:   Physical Exam  Filed Vitals:   12/02/11 1135  BP: 130/58  Pulse: 72  Temp: 97 F (36.1 C)  Height: 5' 6.5" (1.689 m)  Weight: 161 lb 14.4 oz (73.437 kg)    Alert and oriented. Skin warm and dry. Oral mucosa is moist. Natural teeth in good condition. Sclera anicteric, conjunctivae is pink. Thyroid not enlarged. No cervical lymphadenopathy. Lungs clear. Heart regular rate and rhythm.  Abdomen is soft. Bowel sounds are positive. No hepatomegaly. No abdominal masses felt. No tenderness.  No edema to lower extremities. Patient is alert and oriented.      Assessment:   Barrett's esophagus. 10 mm adenoma removed in 2000. Need for surveillance for both.     Plan:    Surveillance EGD/Colonoscopy   The risks and benefits such as perforation, bleeding, and infection were reviewed with the patient and is agreeable.

## 2011-12-03 MED ORDER — PEG-KCL-NACL-NASULF-NA ASC-C 100 G PO SOLR: 1.0000 | Freq: Once | ORAL | Status: DC

## 2011-12-28 ENCOUNTER — Other Ambulatory Visit: Payer: Self-pay | Admitting: Family Medicine

## 2012-01-06 HISTORY — PX: CATARACT EXTRACTION W/ INTRAOCULAR LENS IMPLANT: SHX1309

## 2012-01-11 MED ORDER — SODIUM CHLORIDE 0.45 % IV SOLN
Freq: Once | INTRAVENOUS | Status: AC
  Administered 2012-01-12: 11:00:00 via INTRAVENOUS

## 2012-01-12 ENCOUNTER — Other Ambulatory Visit (INDEPENDENT_AMBULATORY_CARE_PROVIDER_SITE_OTHER): Payer: Self-pay | Admitting: Internal Medicine

## 2012-01-12 ENCOUNTER — Encounter (HOSPITAL_COMMUNITY): Payer: Self-pay | Admitting: *Deleted

## 2012-01-12 ENCOUNTER — Ambulatory Visit (HOSPITAL_COMMUNITY)
Admission: RE | Admit: 2012-01-12 | Discharge: 2012-01-12 | Disposition: A | Discharge status: Home / Self Care | Payer: Medicare Other | Source: Ambulatory Visit | Attending: Internal Medicine | Admitting: Internal Medicine

## 2012-01-12 ENCOUNTER — Encounter (HOSPITAL_COMMUNITY)
Admission: RE | Disposition: A | Discharge status: Home / Self Care | Payer: Self-pay | Source: Ambulatory Visit | Attending: Internal Medicine

## 2012-01-12 DIAGNOSIS — Z09 Encounter for follow-up examination after completed treatment for conditions other than malignant neoplasm: Secondary | ICD-10-CM

## 2012-01-12 DIAGNOSIS — K2270 Barrett's esophagus without dysplasia: Secondary | ICD-10-CM

## 2012-01-12 DIAGNOSIS — I10 Essential (primary) hypertension: Secondary | ICD-10-CM | POA: Insufficient documentation

## 2012-01-12 DIAGNOSIS — K5730 Diverticulosis of large intestine without perforation or abscess without bleeding: Secondary | ICD-10-CM | POA: Insufficient documentation

## 2012-01-12 DIAGNOSIS — Z79899 Other long term (current) drug therapy: Secondary | ICD-10-CM | POA: Insufficient documentation

## 2012-01-12 DIAGNOSIS — Z860100 Personal history of colon polyps, unspecified: Secondary | ICD-10-CM | POA: Insufficient documentation

## 2012-01-12 DIAGNOSIS — D126 Benign neoplasm of colon, unspecified: Secondary | ICD-10-CM

## 2012-01-12 DIAGNOSIS — K2990 Gastroduodenitis, unspecified, without bleeding: Secondary | ICD-10-CM

## 2012-01-12 DIAGNOSIS — K2970 Gastritis, unspecified, without bleeding: Secondary | ICD-10-CM

## 2012-01-12 DIAGNOSIS — K2100 Gastro-esophageal reflux disease with esophagitis, without bleeding: Secondary | ICD-10-CM | POA: Insufficient documentation

## 2012-01-12 DIAGNOSIS — K2210 Ulcer of esophagus without bleeding: Secondary | ICD-10-CM

## 2012-01-12 HISTORY — PX: ESOPHAGOGASTRODUODENOSCOPY: SHX1529

## 2012-01-12 SURGERY — COLONOSCOPY WITH ESOPHAGOGASTRODUODENOSCOPY (EGD)
Anesthesia: Moderate Sedation

## 2012-01-12 MED ORDER — MIDAZOLAM HCL 5 MG/5ML IJ SOLN
INTRAMUSCULAR | Status: AC
  Filled 2012-01-12: qty 10

## 2012-01-12 MED ORDER — DOXYCYCLINE HYCLATE 50 MG PO CAPS: 100.0000 mg | ORAL_CAPSULE | Freq: Two times a day (BID) | ORAL | Status: DC

## 2012-01-12 MED ORDER — OMEPRAZOLE 20 MG PO CPDR: 20.0000 mg | DELAYED_RELEASE_CAPSULE | Freq: Two times a day (BID) | ORAL | Status: DC

## 2012-01-12 MED ORDER — MEPERIDINE HCL 50 MG/ML IJ SOLN
INTRAMUSCULAR | Status: AC
  Filled 2012-01-12: qty 1

## 2012-01-12 MED ORDER — MIDAZOLAM HCL 5 MG/5ML IJ SOLN
INTRAMUSCULAR | Status: DC | PRN
  Administered 2012-01-12: 2 mg via INTRAVENOUS
  Administered 2012-01-12 (×3): 1 mg via INTRAVENOUS

## 2012-01-12 MED ORDER — MEPERIDINE HCL 50 MG/ML IJ SOLN
INTRAMUSCULAR | Status: DC | PRN
  Administered 2012-01-12 (×2): 25 mg via INTRAVENOUS

## 2012-01-12 MED ORDER — STERILE WATER FOR IRRIGATION IR SOLN
Status: DC | PRN
  Administered 2012-01-12: 11:00:00

## 2012-01-12 MED ORDER — HYDROCODONE-ACETAMINOPHEN 5-500 MG PO TABS: 1.0000 | ORAL_TABLET | Freq: Two times a day (BID) | ORAL | Status: AC | PRN

## 2012-01-12 MED ORDER — SODIUM CHLORIDE 0.9 % IJ SOLN
INTRAMUSCULAR | Status: AC
  Filled 2012-01-12: qty 50

## 2012-01-12 NOTE — H&P (Signed)
Kerry Flynn is an 78 y.o. female.   Chief Complaint: Patient is here for esophagogastroduodenoscopy and colonoscopy. HPI: Patient is a 55-year-old Caucasian female who has chronic GERD complicated by short segment Barrett's esophagus. Last EGD was in December 2007. Over the last week she's been having frequent heartburn which is taking OTC medications. She says Prilosec is not working anymore. She denies nausea vomiting or dysphagia. Pulse is undergoing surveillance colonoscopy. She large adenoma removed from her right colon in 2000. Last exam was also in December 2007. She denies abdominal pain recent change in her bowel habits or rectal bleeding. Family history is negative for colorectal carcinoma.  Past Medical History  Diagnosis Date  . Hemorrhoids   . Intermittent vertigo   . Osteoporosis     knees  . Benign recurrent vertigo   . Vitamin D deficiency   . Head trauma     status post fall; upper and lower extremities   . GERD (gastroesophageal reflux disease)   . Diverticulosis   . Chronic constipation   . Osteoarthritis     of the neck  . Dermatitis     recurrent  . Hypertension     Past Surgical History  Procedure Date  . Knee arthroscopy 1999    rt  . Knee arthroscopy 1996    left   . Cholecystectomy 2007  . Rotator cuff repair May 14 2010  . Cataract extraction w/ intraocular lens implant     left, [facility]    Family History  Problem Relation Age of Onset  . Parkinsonism Sister   . Diabetes Mother   . Hypertension Mother     cva  . Colon cancer Neg Hx    Social History:  reports that she has quit smoking. She does not have any smokeless tobacco history on file. She reports that she does not drink alcohol or use illicit drugs.  Allergies:  Allergies  Allergen Reactions  . Biaxin   . Flagyl (Metronidazole)   . Penicillins     Medications Prior to Admission  Medication Dose Route Frequency Provider Last Rate Last Dose  . 0.45 % sodium chloride  infusion   Intravenous Once Malissa Hippo, MD 20 mL/hr at 01/12/12 1044    . meperidine (DEMEROL) 50 MG/ML injection           . midazolam (VERSED) 5 MG/5ML injection            Medications Prior to Admission  Medication Sig Dispense Refill  . acyclovir (ZOVIRAX) 200 MG capsule Take 200 mg by mouth every 4 (four) hours while awake. Prevention of shingles       . docusate sodium (COLACE) 100 MG capsule Take 100 mg by mouth 3 (three) times daily.        . ergocalciferol (VITAMIN D2) 50000 UNITS capsule Take 50,000 Units by mouth every 14 (fourteen) days.       . Magnesium Oxide (PHILLIPS) 500 MG (LAX) TABS Take by mouth. Take 2 tablets every other day       . meclizine (ANTIVERT) 12.5 MG tablet Take 12.5 mg by mouth as needed.        . Multiple Vitamin (MULTIVITAMIN) tablet Take 1 tablet by mouth daily.        . naproxen sodium (ANAPROX) 220 MG tablet Take 220 mg by mouth 2 (two) times daily with meals. Take 2 tablets 2 times a day       . omeprazole (PRILOSEC OTC) 20 MG tablet Take  20 mg by mouth every morning.       . peg 3350 powder (MOVIPREP) 100 G SOLR Take 1 kit (100 g total) by mouth once.  1 kit  0  . Probiotic Product (HEALTHY COLON) CAPS Take by mouth daily.       . Wheat Dextrin (BENEFIBER) POWD Take by mouth 3 (three) times daily. Use 2 scoops by mouth 3 times a day       . nitroGLYCERIN (NITROSTAT) 0.4 MG SL tablet Place 0.4 mg under the tongue every 5 (five) minutes as needed. 1 tablet under the tongue at onset of chest pain: you may repeat every 5 minutes for up to 3 doses        No results found for this or any previous visit (from the past 48 hour(s)). No results found.  Review of Systems  Constitutional: Weight loss: voluntary weigh loss of few pounds.  Gastrointestinal: Negative for nausea, vomiting, abdominal pain, diarrhea, constipation, blood in stool and melena.    Blood pressure 139/70, pulse 85, temperature 98.1 F (36.7 C), temperature source Oral, resp. rate  20, height 5' 6.5" (1.689 m), weight 161 lb (73.029 kg), SpO2 98.00%. Physical Exam  Constitutional: She appears well-developed and well-nourished.  HENT:  Mouth/Throat: Oropharynx is clear and moist.  Eyes: Conjunctivae are normal. No scleral icterus.  Neck: No thyromegaly present.  Cardiovascular: Normal rate, regular rhythm and normal heart sounds.   No murmur heard. Respiratory: Effort normal and breath sounds normal.  GI: Soft. She exhibits no distension and no mass. There is no tenderness.  Musculoskeletal: She exhibits no edema.  Lymphadenopathy:    She has no cervical adenopathy.  Neurological: She is alert.  Skin: Skin is warm.     Assessment/Plan Chronic GERD complicated by short segment Barrett's esophagus. History of colonic polyps. Surveillance EGD and colonoscopy.  Meri Pelot U 01/12/2012, 10:58 AM

## 2012-01-12 NOTE — Op Note (Signed)
EGD AND COLONOSCOPY  PROCEDURE REPORT  PATIENT:  Kerry Flynn  MR#:  [id number] Birthdate:  [birth year], 78 y.o., female Endoscopist:  Dr. Malissa Hippo, MD Referred By:  Dr. [name], MD Procedure Date: 01/12/2012  Procedure:   EGD & Colonoscopy  Indications:  Patient is a 59-year-old Caucasian female with chronic GERD complicated by short segment Barrett's esophagus as well as history of colonic polyps. Her EGD and colonoscopy were both in December 2007. She's been having frequent breakthrough symptoms her heartburn. She is undergoing surveillance EGD and colonoscopy.            Informed Consent:  Both  procedures and risks reviewed with the patient and informed consent was obtained.  Medications:  Demerol 50 mg IV Versed 5mg  IV Cetacaine spray topically for oropharyngeal anesthesia  EGD  Description of procedure:  The endoscope was introduced through the mouth and advanced to the second portion of the duodenum without difficulty or limitations. The mucosal surfaces were surveyed very carefully during advancement of the scope and upon withdrawal.  Findings:  Esophagus:  Mucosa of the esophagus was normal. Approximately 6 x 8 mm ulcer was noted at GE junction. Biopsy was taken from GE junction there was no obvious Barrett's mucosa. GEJ:  38 cm Hiatus:  40 cm Stomach:  Stomach was empty and distended very well with insufflation. Patchy erythema and granularity noted to mucosa at body and antrum with multiple specks of coffee-ground material. No underlying ulcer or AV malformations noted on vigorous washing. Pyloric channel was patent. Mucosa at fundus and cardia were normal. There was scarring at angularis. Duodenum:  Normal bulbar and post bulbar mucosa.  Therapeutic/Diagnostic Maneuvers Performed:  biopsy taken from GE junction including ulcer and submitted in one container.  COLONOSCOPY Description of procedure:  After a digital rectal exam was performed, that  colonoscope was advanced from the anus through the rectum and colon to the area of the cecum, ileocecal valve and appendiceal orifice. The cecum was deeply intubated. These structures were well-seen and photographed for the record. From the level of the cecum and ileocecal valve, the scope was slowly and cautiously withdrawn. The mucosal surfaces were carefully surveyed utilizing scope tip to flexion to facilitate fold flattening as needed. The scope was pulled down into the rectum where a thorough exam including retroflexion was performed.  Findings:   Prep satisfactory. Multiple diverticula noted throughout the colon but mostly at sigmoid colon. One diverticulum at sigmoid colon with ulcer at mouth and erythema with exudate. Two small polyps ablated via cold biopsy from transverse colon and submitted in one container. Approximately 4 cm long sessile polyp at ascending colon. Saline assisted piecemeal polypectomy performed. Residual polyp treated with argon plasma coagulator. Polypectomy site was deeply the right site and therefore a edge is secured with application of single Hemoclip. Small hemorrhoids below the dentate line.  Therapeutic/Diagnostic Maneuvers Performed:  See above  Complications:  None  Cecal Withdrawal Time:  33 minutes  Impression:  Single ulcer at GE junction but no Barrett's mucosa noted. Biopsy taken from GEJ  Small sliding-type hernia. Diffuse gastritis possibly secondary to NSAID use. These note she has been treated for H. pylori  Gastritis. Pan colonic diverticulosis with changes of diverticulitis involving diverticulum at sigmoid colon. Large sessile polyp at ascending colon. With combination of piecemeal polypectomy and APC following saline injection. Single Hemoclip applied to right edge of polypectomy site. Two small polyps ablated via cold biopsy from transverse colon and submitted in one  container. Small external hemorrhoids..  Recommendations:  No NSAIDs  for 2 weeks. Increase omeprazole to 20 mg by mouth twice a day. Doxycycline 100 mg by mouth twice a day for 10 days. Hydrocodone/acetaminophen 5-500 twice a day when necessary prescription given for 60 without refill. I will be contacting patient with results of biopsy and further recommendations     REHMAN,NAJEEB U  01/12/2012 12:15 PM  CC: Dr. [name], MD, MD & Dr. Bonnetta Barry ref. provider found

## 2012-01-17 ENCOUNTER — Other Ambulatory Visit (HOSPITAL_COMMUNITY): Payer: Self-pay | Admitting: Internal Medicine

## 2012-01-17 ENCOUNTER — Encounter (INDEPENDENT_AMBULATORY_CARE_PROVIDER_SITE_OTHER): Payer: Self-pay | Admitting: *Deleted

## 2012-01-24 HISTORY — PX: EYE SURGERY: SHX253

## 2012-01-27 ENCOUNTER — Telehealth (INDEPENDENT_AMBULATORY_CARE_PROVIDER_SITE_OTHER): Payer: Self-pay | Admitting: *Deleted

## 2012-01-27 NOTE — Telephone Encounter (Signed)
Would like to get her biopsy results. The return phone number is [phone].

## 2012-01-28 HISTORY — PX: TRANSTHORACIC ECHOCARDIOGRAM: SHX275

## 2012-01-28 NOTE — Telephone Encounter (Signed)
Message left at home 

## 2012-02-04 ENCOUNTER — Telehealth (INDEPENDENT_AMBULATORY_CARE_PROVIDER_SITE_OTHER): Payer: Self-pay | Admitting: *Deleted

## 2012-02-04 NOTE — Telephone Encounter (Signed)
Patient called and left a message asking for someone to please call her with the biopsy results. The return phone number is [phone].

## 2012-02-05 ENCOUNTER — Other Ambulatory Visit (INDEPENDENT_AMBULATORY_CARE_PROVIDER_SITE_OTHER): Payer: Self-pay | Admitting: Internal Medicine

## 2012-02-05 DIAGNOSIS — M1990 Unspecified osteoarthritis, unspecified site: Secondary | ICD-10-CM

## 2012-02-05 MED ORDER — DICLOFENAC SODIUM 1 % TD GEL: 1.0000 "application " | Freq: Four times a day (QID) | TRANSDERMAL | Status: DC | PRN

## 2012-02-05 NOTE — Telephone Encounter (Signed)
I called patient and reminded her that I went over the results with her on 01/14/2012. I went over the results of that again today. Suggested she should take Aleve on an as-needed basis. Prescription for Voltaren gel 1% sense to her pharmacy.

## 2012-02-10 ENCOUNTER — Telehealth: Payer: Self-pay | Admitting: Family Medicine

## 2012-02-17 NOTE — Telephone Encounter (Signed)
pls schedule pt for approx next Thursday, I am still trying to obtain the reports in full but should have them by then

## 2012-02-18 NOTE — Telephone Encounter (Signed)
Pt aware.

## 2012-02-23 ENCOUNTER — Encounter: Payer: Self-pay | Admitting: Family Medicine

## 2012-02-23 ENCOUNTER — Ambulatory Visit (INDEPENDENT_AMBULATORY_CARE_PROVIDER_SITE_OTHER): Payer: 59 | Admitting: Family Medicine

## 2012-02-23 VITALS — BP 150/60 | HR 83 | Resp 15 | Ht 66.5 in | Wt 162.1 lb

## 2012-02-23 DIAGNOSIS — D126 Benign neoplasm of colon, unspecified: Secondary | ICD-10-CM

## 2012-02-23 DIAGNOSIS — K2270 Barrett's esophagus without dysplasia: Secondary | ICD-10-CM

## 2012-02-23 DIAGNOSIS — I10 Essential (primary) hypertension: Secondary | ICD-10-CM

## 2012-02-23 DIAGNOSIS — K635 Polyp of colon: Secondary | ICD-10-CM

## 2012-02-23 NOTE — Patient Instructions (Signed)
F/u  As before  Please  Call if you need me before  All the best with your second colonoscopy and polypectomy  I am happy you recent heart echo was good, and that you have had successful cataract surgery this year

## 2012-02-25 HISTORY — PX: NM MYOCAR PERF WALL MOTION: HXRAD629

## 2012-02-27 DIAGNOSIS — K635 Polyp of colon: Secondary | ICD-10-CM | POA: Insufficient documentation

## 2012-02-27 NOTE — Progress Notes (Signed)
  Subjective:    Patient ID: Kerry Flynn, female    DOB: 1927/01/28, 78 y.o.   MRN: [id number]  HPI Pt in primarily to discuss recent upper and lower endoscopy reports. She is extremely concerned that, despite the fact she had colon polyps, her repeat scope to remove a residual polyp is 3 months after the initial. I reassured her that this is in keeping with standard of care and that she need not worry about this. Has questions also about current and future use of anti inflammatories. She has scaled back tremendously, and now plans to rely on topical voltaren sparingly, and tylenol, as well as continued physical therapy, with which In totally agree   Review of Systems See HPI Denies recent fever or chills. Denies sinus pressure, nasal congestion, ear pain or sore throat. Denies chest congestion, productive cough or wheezing. Denies chest pains, palpitations and leg swelling  Chronic knee pain and reduced mobility with deformity. Denies skin break down or rash.        Objective:   Physical Exam Patient alert and oriented and in no cardiopulmonary distress.  HEENT: No facial asymmetry, EOMI, no sinus tenderness,  oropharynx pink and moist.  Neck supple no adenopathy.  Chest: Clear to auscultation bilaterally.  CVS: S1, S2 no murmurs, no S3.  ABD: Soft non tender. Bowel sounds normal.  Ext: No edema  MS: decreased  ROM spine, shoulders, hips and knees.  Skin: Intact, no ulcerations or rash noted.         Assessment & Plan:

## 2012-02-27 NOTE — Assessment & Plan Note (Signed)
reassured pt in the office that waiting fo 3 months was appropriate management , she will have f/u colonoscopy in the future

## 2012-02-27 NOTE — Assessment & Plan Note (Signed)
Sub optimal control, no med change, lifestyle modification only

## 2012-02-27 NOTE — Assessment & Plan Note (Signed)
Upper endoscopy in 2013 showed no malignant change, this was discussed in office with pt

## 2012-03-01 ENCOUNTER — Other Ambulatory Visit (INDEPENDENT_AMBULATORY_CARE_PROVIDER_SITE_OTHER): Payer: Self-pay | Admitting: *Deleted

## 2012-03-01 ENCOUNTER — Telehealth (INDEPENDENT_AMBULATORY_CARE_PROVIDER_SITE_OTHER): Payer: Self-pay | Admitting: *Deleted

## 2012-03-01 DIAGNOSIS — Z86010 Personal history of colonic polyps: Secondary | ICD-10-CM

## 2012-03-01 MED ORDER — PEG-KCL-NACL-NASULF-NA ASC-C 100 G PO SOLR: 1.0000 | Freq: Once | ORAL | Status: DC

## 2012-03-01 NOTE — Telephone Encounter (Signed)
Patient needs movi prep 

## 2012-03-10 ENCOUNTER — Encounter: Payer: Self-pay | Admitting: Family Medicine

## 2012-04-03 ENCOUNTER — Telehealth (INDEPENDENT_AMBULATORY_CARE_PROVIDER_SITE_OTHER): Payer: Self-pay | Admitting: *Deleted

## 2012-04-03 NOTE — Telephone Encounter (Signed)
Requesting MD: simpson  PCP:  simpson     Name & DOB: Kerry Flynn 07/11/1927     Procedure: tcs  Reason/Indication:  Hx polyps  Has patient had this procedure before?  yes  If so, when, by whom and where?  1/13 (Repeat 3 moths per NUR)  Is there a family history of colon cancer?  no  Who?  What age when diagnosed?    Is patient diabetic?   no      Does patient have prosthetic heart valve?  no  Do you have a pacemaker?  no  Has patient had joint replacement within last 12 months?  no  Is patient on Coumadin, Plavix and/or Aspirin? no  Medications: med list in EPIC  Allergies: pcn, flagyl, biaxin  Pharm[facility]: [facility]  Medication Adjustment:   Procedure date & time: 04/19/12 at 1200

## 2012-04-05 NOTE — Telephone Encounter (Signed)
agree

## 2012-04-18 ENCOUNTER — Encounter (HOSPITAL_COMMUNITY): Payer: Self-pay | Admitting: Pharmacy Technician

## 2012-04-18 MED ORDER — SODIUM CHLORIDE 0.45 % IV SOLN
Freq: Once | INTRAVENOUS | Status: AC
  Administered 2012-04-19: 11:00:00 via INTRAVENOUS

## 2012-04-19 ENCOUNTER — Ambulatory Visit (HOSPITAL_COMMUNITY)
Admission: RE | Admit: 2012-04-19 | Discharge: 2012-04-19 | Disposition: A | Discharge status: Home / Self Care | Payer: Medicare Other | Source: Ambulatory Visit | Attending: Internal Medicine | Admitting: Internal Medicine

## 2012-04-19 ENCOUNTER — Encounter (HOSPITAL_COMMUNITY)
Admission: RE | Disposition: A | Discharge status: Home / Self Care | Payer: Self-pay | Source: Ambulatory Visit | Attending: Internal Medicine

## 2012-04-19 ENCOUNTER — Encounter (HOSPITAL_COMMUNITY): Payer: Self-pay | Admitting: *Deleted

## 2012-04-19 DIAGNOSIS — I10 Essential (primary) hypertension: Secondary | ICD-10-CM | POA: Insufficient documentation

## 2012-04-19 DIAGNOSIS — K644 Residual hemorrhoidal skin tags: Secondary | ICD-10-CM | POA: Insufficient documentation

## 2012-04-19 DIAGNOSIS — Z860100 Personal history of colon polyps, unspecified: Secondary | ICD-10-CM | POA: Insufficient documentation

## 2012-04-19 DIAGNOSIS — Z09 Encounter for follow-up examination after completed treatment for conditions other than malignant neoplasm: Secondary | ICD-10-CM | POA: Insufficient documentation

## 2012-04-19 DIAGNOSIS — K5730 Diverticulosis of large intestine without perforation or abscess without bleeding: Secondary | ICD-10-CM | POA: Insufficient documentation

## 2012-04-19 DIAGNOSIS — D126 Benign neoplasm of colon, unspecified: Secondary | ICD-10-CM | POA: Insufficient documentation

## 2012-04-19 DIAGNOSIS — Z79899 Other long term (current) drug therapy: Secondary | ICD-10-CM | POA: Insufficient documentation

## 2012-04-19 HISTORY — PX: COLONOSCOPY: SHX5424

## 2012-04-19 SURGERY — COLONOSCOPY
Anesthesia: Moderate Sedation

## 2012-04-19 MED ORDER — MEPERIDINE HCL 50 MG/ML IJ SOLN
INTRAMUSCULAR | Status: DC | PRN
  Administered 2012-04-19 (×2): 25 mg via INTRAVENOUS

## 2012-04-19 MED ORDER — STERILE WATER FOR IRRIGATION IR SOLN
Status: DC | PRN
  Administered 2012-04-19: 13:00:00

## 2012-04-19 MED ORDER — MIDAZOLAM HCL 5 MG/5ML IJ SOLN
INTRAMUSCULAR | Status: AC
  Filled 2012-04-19: qty 10

## 2012-04-19 MED ORDER — MEPERIDINE HCL 50 MG/ML IJ SOLN
INTRAMUSCULAR | Status: AC
  Filled 2012-04-19: qty 1

## 2012-04-19 MED ORDER — MIDAZOLAM HCL 5 MG/5ML IJ SOLN
INTRAMUSCULAR | Status: DC | PRN
  Administered 2012-04-19: 2 mg via INTRAVENOUS
  Administered 2012-04-19 (×2): 1 mg via INTRAVENOUS

## 2012-04-19 NOTE — H&P (Signed)
Kerry Flynn is an 78 y.o. female.   Chief Complaint: Patient is here for colonoscopy. HPI: Patient is a 89-year-old Caucasian female who is here for surveillance colonoscopy. Her last exam was in January 2013 the large sessile polyp in ascending colon which was treated piecemeal. She is returning for repeat exam to make sure she does not have any residual polyp. She is in the hospital for further therapy. She is not taking NSAIDs anymore. Her bowels move with medication.  Past Medical History  Diagnosis Date  . Hemorrhoids   . Intermittent vertigo   . Osteoporosis     knees  . Benign recurrent vertigo   . Vitamin D deficiency   . Head trauma     status post fall; upper and lower extremities   . GERD (gastroesophageal reflux disease)   . Diverticulosis   . Chronic constipation   . Osteoarthritis     of the neck  . Dermatitis     recurrent  . Hypertension     Past Surgical History  Procedure Date  . Knee arthroscopy 1999    rt  . Knee arthroscopy 1996    left   . Cholecystectomy 2007  . Rotator cuff repair May 14 2010  . Cataract extraction w/ intraocular lens implant     left, [facility]  . Eye surgery 2013    bilateral cataract extraction    Family History  Problem Relation Age of Onset  . Parkinsonism Sister   . Diabetes Mother   . Hypertension Mother     cva  . Colon cancer Neg Hx    Social History:  reports that she has quit smoking. She does not have any smokeless tobacco history on file. She reports that she drinks about .6 ounces of alcohol per week. She reports that she does not use illicit drugs.  Allergies:  Allergies  Allergen Reactions  . Biaxin   . Flagyl (Metronidazole)   . Penicillins     Medications Prior to Admission  Medication Sig Dispense Refill  . acetaminophen (TYLENOL) 500 MG tablet Take 1,000 mg by mouth every 6 (six) hours as needed. pain      . amLODipine (NORVASC) 5 MG tablet TAKE 1 TABLET BY MOUTH ONCE DAILY  30 tablet  4  .  BISACODYL LAXATIVE PO Take 100 mg by mouth 4 (four) times daily. otc med      . Calcium-Vitamin D-Vitamin K 500-100-40 MG-UNT-MCG CHEW Chew 1 tablet by mouth daily.      . diclofenac sodium (VOLTAREN) 1 % GEL Apply 1 application topically 4 (four) times daily as needed.  100 g  2  . ergocalciferol (VITAMIN D2) 50000 UNITS capsule Take 50,000 Units by mouth every 14 (fourteen) days.       . Magnesium Oxide (PHILLIPS) 500 MG (LAX) TABS Take 1 tablet by mouth daily.       . meclizine (ANTIVERT) 12.5 MG tablet Take 12.5 mg by mouth as needed. vertigo      . Misc Natural Products (COSAMIN ASU ADVANCED FORMULA) CAPS Take 1 capsule by mouth 3 (three) times daily with meals.       . Multiple Vitamin (MULTIVITAMIN) tablet Take 1 tablet by mouth daily.        Marland Kitchen omeprazole (PRILOSEC) 20 MG capsule Take 1 capsule (20 mg total) by mouth 2 (two) times daily before a meal.  60 capsule  5  . Probiotic Product (HEALTHY COLON) CAPS Take by mouth daily.       Marland Kitchen  ramipril (ALTACE) 10 MG capsule TAKE ONE CAPSULE BY MOUTH EVERY DAY  30 capsule  4  . Wheat Dextrin (BENEFIBER) POWD Take 2 scoop by mouth 2 (two) times daily.       . nitroGLYCERIN (NITROSTAT) 0.4 MG SL tablet Place 0.4 mg under the tongue every 5 (five) minutes as needed. 1 tablet under the tongue at onset of chest pain: you may repeat every 5 minutes for up to 3 doses      . peg 3350 powder (MOVIPREP) 100 G SOLR Take 1 kit (100 g total) by mouth once.  1 kit  0  . peg 3350 powder (MOVIPREP) 100 G SOLR Take 1 kit (100 g total) by mouth once.  1 kit  0    No results found for this or any previous visit (from the past 48 hour(s)). No results found.  Review of Systems  Constitutional: Negative for weight loss.  Gastrointestinal: Negative for abdominal pain, diarrhea, constipation, blood in stool and melena.    Blood pressure 165/72, pulse 91, temperature 98 F (36.7 C), temperature source Oral, resp. rate 24, height 5' 6.5" (1.689 m), weight 155 lb  (70.308 kg), SpO2 96.00%. Physical Exam  Constitutional: She appears well-developed and well-nourished.  HENT:  Mouth/Throat: Oropharynx is clear and moist.  Eyes: Conjunctivae are normal. No scleral icterus.  Neck: No thyromegaly present.  Cardiovascular: Normal rate, regular rhythm and normal heart sounds.   No murmur heard. Respiratory: Effort normal and breath sounds normal.  GI: Soft. She exhibits no distension and no mass.  Musculoskeletal: She exhibits no edema.  Lymphadenopathy:    She has no cervical adenopathy.  Neurological: She is alert.  Skin: Skin is warm and dry.     Assessment/Plan History of complex polyp at ascending colon. Colonoscopy with polypectomy if she still has residual polyp  Keylah Darwish U 04/19/2012, 12:35 PM

## 2012-04-19 NOTE — Discharge Instructions (Signed)
Resume usual medications and diet. No driving for 24 hours.   Physician will contact you with biopsy results.Colonoscopy Care After Read the instructions outlined below and refer to this sheet in the next few weeks. These discharge instructions provide you with general information on caring for yourself after you leave the hospital. Your doctor may also give you specific instructions. While your treatment has been planned according to the most current medical practices available, unavoidable complications occasionally occur. If you have any problems or questions after discharge, call your doctor. HOME CARE INSTRUCTIONS ACTIVITY:  You may resume your regular activity, but move at a slower pace for the next 24 hours.   Take frequent rest periods for the next 24 hours.   Walking will help get rid of the air and reduce the bloated feeling in your belly (abdomen).   No driving for 24 hours (because of the medicine (anesthesia) used during the test).   You may shower.   Do not sign any important legal documents or operate any machinery for 24 hours (because of the anesthesia used during the test).  NUTRITION:  Drink plenty of fluids.   You may resume your normal diet as instructed by your doctor.   Begin with a light meal and progress to your normal diet. Heavy or fried foods are harder to digest and may make you feel sick to your stomach (nauseated).   Avoid alcoholic beverages for 24 hours or as instructed.  MEDICATIONS:  You may resume your normal medications unless your doctor tells you otherwise.  WHAT TO EXPECT TODAY:  Some feelings of bloating in the abdomen.   Passage of more gas than usual.   Spotting of blood in your stool or on the toilet paper.  IF YOU HAD POLYPS REMOVED DURING THE COLONOSCOPY:  No aspirin products for 7 days or as instructed.   No alcohol for 7 days or as instructed.   Eat a soft diet for the next 24 hours.  FINDING OUT THE RESULTS OF YOUR  TEST Not all test results are available during your visit. If your test results are not back during the visit, make an appointment with your caregiver to find out the results. Do not assume everything is normal if you have not heard from your caregiver or the medical facility. It is important for you to follow up on all of your test results.  SEEK IMMEDIATE MEDICAL CARE IF:  You have more than a spotting of blood in your stool.   Your belly is swollen (abdominal distention).   You are nauseated or vomiting.   You have a fever.   You have abdominal pain or discomfort that is severe or gets worse throughout the day.  Document Released: 07/27/2004 Document Revised: 12/02/2011 Document Reviewed: 07/25/2008 ExitCare Patient Information 2012 [employer]. 

## 2012-04-19 NOTE — Op Note (Signed)
COLONOSCOPY PROCEDURE REPORT  PATIENT:  Kerry Flynn  MR#:  [id number] Birthdate:  1927/08/01, 78 y.o., female Endoscopist:  Dr. Malissa Hippo, MD Referred By:  Dr. Milus Mallick. Lodema Hong, MD Procedure Date: 04/19/2012  Procedure:   Colonoscopy  Indications:  Patient is a 33-year-old Caucasian female who underwent colonoscopy in January 2013 have large sessile polyp in ascending colon treated with piecemeal polypectomy and APC. She is returning to make sure she does not have residual polyp.  Informed Consent:  The procedure and risks were reviewed with the patient and informed consent was obtained.  Medications:  Demerol 50 mg IV Versed 4 mg IV  Description of procedure:  After a digital rectal exam was performed, that colonoscope was advanced from the anus through the rectum and colon to the area of the cecum, ileocecal valve and appendiceal orifice. The cecum was deeply intubated. These structures were well-seen and photographed for the record. From the level of the cecum and ileocecal valve, the scope was slowly and cautiously withdrawn. The mucosal surfaces were carefully surveyed utilizing scope tip to flexion to facilitate fold flattening as needed. The scope was pulled down into the rectum where a thorough exam including retroflexion was performed.  Findings:   Prep satisfactory. No residual polyp noted at polypectomy site at the ascending colon. Two small polyps in the region of hepatic flexure one was biopsied for histology. Other one could not be because of location and hyperperistalsis. Multiple diverticula in sigmoid colon but a few more scattered throughout the colon. Small hemorrhoids below the dentate line.  Therapeutic/Diagnostic Maneuvers Performed:  See above  Complications:  None  Cecal Withdrawal Time:  16 minutes  Impression:  Examination performed to cecum. No residual polyp at ascending colon. Two small polyps at hepatic flexure. One was biopsied for  histology. Other one could not be moved because of location. Pancolonic diverticulosis. Small external hemorrhoids.  Recommendations:  Standard instructions given. I will be contacting patient with results of biopsy.  Seini Lannom U  04/19/2012 1:15 PM  CC: Dr. [name], MD, MD & Dr. Bonnetta Barry ref. provider found

## 2012-04-21 ENCOUNTER — Encounter (HOSPITAL_COMMUNITY): Payer: Self-pay | Admitting: Internal Medicine

## 2012-04-25 ENCOUNTER — Telehealth (INDEPENDENT_AMBULATORY_CARE_PROVIDER_SITE_OTHER): Payer: Self-pay | Admitting: *Deleted

## 2012-04-25 NOTE — Telephone Encounter (Signed)
[facility] has requested a refill on Omeprazole 20 mg capsule

## 2012-04-26 ENCOUNTER — Encounter (INDEPENDENT_AMBULATORY_CARE_PROVIDER_SITE_OTHER): Payer: Self-pay | Admitting: *Deleted

## 2012-04-26 ENCOUNTER — Other Ambulatory Visit: Payer: Self-pay

## 2012-04-26 MED ORDER — RAMIPRIL 10 MG PO CAPS: ORAL_CAPSULE | ORAL | Status: DC

## 2012-04-26 MED ORDER — AMLODIPINE BESYLATE 5 MG PO TABS: ORAL_TABLET | ORAL | Status: DC

## 2012-05-01 ENCOUNTER — Other Ambulatory Visit: Payer: Self-pay | Admitting: Family Medicine

## 2012-05-02 MED ORDER — OMEPRAZOLE 20 MG PO CPDR: 20.0000 mg | DELAYED_RELEASE_CAPSULE | Freq: Two times a day (BID) | ORAL | Status: DC

## 2012-05-23 ENCOUNTER — Ambulatory Visit (INDEPENDENT_AMBULATORY_CARE_PROVIDER_SITE_OTHER): Payer: 59 | Admitting: Family Medicine

## 2012-05-23 ENCOUNTER — Encounter: Payer: Self-pay | Admitting: Family Medicine

## 2012-05-23 VITALS — BP 117/72 | HR 72 | Resp 18 | Ht 66.5 in | Wt 163.0 lb

## 2012-05-23 DIAGNOSIS — M1710 Unilateral primary osteoarthritis, unspecified knee: Secondary | ICD-10-CM

## 2012-05-23 DIAGNOSIS — I2510 Atherosclerotic heart disease of native coronary artery without angina pectoris: Secondary | ICD-10-CM

## 2012-05-23 DIAGNOSIS — IMO0002 Reserved for concepts with insufficient information to code with codable children: Secondary | ICD-10-CM

## 2012-05-23 DIAGNOSIS — D126 Benign neoplasm of colon, unspecified: Secondary | ICD-10-CM

## 2012-05-23 DIAGNOSIS — I10 Essential (primary) hypertension: Secondary | ICD-10-CM

## 2012-05-23 DIAGNOSIS — M79641 Pain in right hand: Secondary | ICD-10-CM | POA: Insufficient documentation

## 2012-05-23 DIAGNOSIS — R5383 Other fatigue: Secondary | ICD-10-CM

## 2012-05-23 DIAGNOSIS — R42 Dizziness and giddiness: Secondary | ICD-10-CM

## 2012-05-23 DIAGNOSIS — M79609 Pain in unspecified limb: Secondary | ICD-10-CM

## 2012-05-23 DIAGNOSIS — R5381 Other malaise: Secondary | ICD-10-CM

## 2012-05-23 DIAGNOSIS — M79642 Pain in left hand: Secondary | ICD-10-CM | POA: Insufficient documentation

## 2012-05-23 DIAGNOSIS — K635 Polyp of colon: Secondary | ICD-10-CM

## 2012-05-23 MED ORDER — CELECOXIB 100 MG PO CAPS: 100.0000 mg | ORAL_CAPSULE | Freq: Every day | ORAL | Status: DC

## 2012-05-23 NOTE — Progress Notes (Signed)
  Subjective:    Patient ID: Kerry Flynn, female    DOB: [birth year], 78 y.o.   MRN: [id number]  HPI The PT is here for follow up and re-evaluation of chronic medical conditions, medication management and review of any available recent lab and radiology data.  Preventive health is updated, specifically  Cancer screening and Immunization.   Questions or concerns regarding consultations or procedures which the PT has had in the interim are  Addressed.Kerry Flynn again wished to, and did review her recent colonoscopy report, which fortuunately showed only benign pathology The PT denies any adverse reactions to current medications since the last visit.  C/o worsening pain in both hands and also her fingers, wonders if she has rheumatoid disease. Interested in trying alternate anti inflammatory to see if this improves symptoms     Review of Systems See HPI Denies recent fever or chills. Denies sinus pressure, nasal congestion, ear pain or sore throat. Denies chest congestion, productive cough or wheezing. Denies chest pains, palpitations and leg swelling Denies abdominal pain, nausea, vomiting,diarrhea or constipation.   Denies dysuria, frequency, hesitancy or incontinence. Worsening joint pain, swelling and limitation in mobility. Denies headaches, seizures, numbness, or tingling. Denies depression, anxiety or insomnia. Denies skin break down or rash.        Objective:   Physical Exam Patient alert and oriented and in no cardiopulmonary distress.  HEENT: No facial asymmetry, EOMI, no sinus tenderness,  oropharynx pink and moist.  Neck supple no adenopathy.  Chest: Clear to auscultation bilaterally.  CVS: S1, S2 no murmurs, no S3.  ABD: Soft non tender. Bowel sounds normal.  Ext: No edema  Kerry: decreased  ROM spine, shoulders, hips and knees.Deformity of hands and digits, but not convincingly6  consistent with rheumatoid arthritis in my opinion  Skin: Intact, no ulcerations  or rash noted.  Psych: Good eye contact, normal affect. Memory intact not anxious or depressed appearing.  CNS: CN 2-12 intact, power, tone and sensation normal throughout.        Assessment & Plan:

## 2012-05-23 NOTE — Patient Instructions (Addendum)
F/U November 18 or after  CBc, fasting lipid, chem 7, vit d  Nov 15 or after  I am happy that your colonoscopy result was good, next is due in 2016.  Glad urine symptoms have also improved, use estrace sparingly pwer your gyne  Stop voltaren and we will try celebrex 100mg  one 3 to 5 days per week as needed for joint pain,( script is written for daily)  Ok to use tylenol every day  You need to consider rheumatology evaluation, call if you decide to go

## 2012-05-24 ENCOUNTER — Telehealth: Payer: Self-pay | Admitting: Family Medicine

## 2012-05-24 ENCOUNTER — Other Ambulatory Visit: Payer: Self-pay | Admitting: Family Medicine

## 2012-05-24 MED ORDER — DOXYCYCLINE HYCLATE 100 MG PO TABS: 100.0000 mg | ORAL_TABLET | Freq: Two times a day (BID) | ORAL | Status: AC

## 2012-05-24 NOTE — Telephone Encounter (Signed)
Tick bite 3 days ago, improving with neosporin still pink, doxycycline prescribed and pt aware

## 2012-05-24 NOTE — Telephone Encounter (Signed)
Does she need any other treatment for this?

## 2012-05-28 NOTE — Assessment & Plan Note (Signed)
Recent re eval by GI successful, no malignancy pt reassured again in office

## 2012-05-28 NOTE — Assessment & Plan Note (Signed)
Increased pain and stiffness, concerned she may have rheumatoid disease, but not willing to commit to eval at this time, my clinical suspiscion for this dx is not very high. Will chang anti inflammatory with cautious use , she has Barret's

## 2012-05-28 NOTE — Assessment & Plan Note (Signed)
Currently asymptomatic, has re established with [place] and has been reviewed in the past 12 month

## 2012-05-28 NOTE — Assessment & Plan Note (Signed)
Asymptomatic currently, as needed meclizine only

## 2012-05-28 NOTE — Assessment & Plan Note (Signed)
Controlled, no change in medication  

## 2012-07-26 ENCOUNTER — Telehealth: Payer: Self-pay | Admitting: Family Medicine

## 2012-07-26 ENCOUNTER — Other Ambulatory Visit: Payer: Self-pay

## 2012-07-26 MED ORDER — MECLIZINE HCL 12.5 MG PO TABS: 12.5000 mg | ORAL_TABLET | ORAL | Status: DC | PRN

## 2012-07-26 NOTE — Telephone Encounter (Signed)
Refill sent.

## 2012-08-22 ENCOUNTER — Other Ambulatory Visit: Payer: Self-pay | Admitting: Family Medicine

## 2012-10-04 ENCOUNTER — Other Ambulatory Visit: Payer: Self-pay

## 2012-10-04 MED ORDER — RAMIPRIL 10 MG PO CAPS: ORAL_CAPSULE | ORAL | Status: DC

## 2012-11-14 ENCOUNTER — Other Ambulatory Visit: Payer: Self-pay | Admitting: Family Medicine

## 2012-11-15 LAB — BASIC METABOLIC PANEL
Calcium: 9.8 mg/dL (ref 8.4–10.5)
Creat: 0.98 mg/dL (ref 0.50–1.10)

## 2012-11-15 LAB — LIPID PANEL
HDL: 68 mg/dL (ref >39)
Triglycerides: 78 mg/dL (ref <150)

## 2012-11-15 LAB — CBC
MCH: 29.9 pg (ref 26.0–34.0)
MCHC: 33.2 g/dL (ref 30.0–36.0)
MCV: 90 fL (ref 78.0–100.0)
Platelets: 196 10*3/uL (ref 150–400)
RDW: 14 % (ref 11.5–15.5)

## 2012-11-30 ENCOUNTER — Ambulatory Visit (INDEPENDENT_AMBULATORY_CARE_PROVIDER_SITE_OTHER): Payer: 59 | Admitting: Family Medicine

## 2012-11-30 ENCOUNTER — Encounter: Payer: Self-pay | Admitting: Family Medicine

## 2012-11-30 VITALS — BP 140/70 | HR 75 | Resp 15 | Ht 66.5 in | Wt 166.0 lb

## 2012-11-30 DIAGNOSIS — I10 Essential (primary) hypertension: Secondary | ICD-10-CM

## 2012-11-30 DIAGNOSIS — K2270 Barrett's esophagus without dysplasia: Secondary | ICD-10-CM

## 2012-11-30 DIAGNOSIS — M8580 Other specified disorders of bone density and structure, unspecified site: Secondary | ICD-10-CM

## 2012-11-30 DIAGNOSIS — M899 Disorder of bone, unspecified: Secondary | ICD-10-CM

## 2012-11-30 MED ORDER — AMLODIPINE BESYLATE 5 MG PO TABS: ORAL_TABLET | ORAL | Status: DC

## 2012-11-30 NOTE — Progress Notes (Signed)
  Subjective:    Patient ID: Kerry Flynn, female    DOB: 1927-09-06, 78 y.o.   MRN: [id number]  HPI The PT is here for follow up and re-evaluation of chronic medical conditions, medication management and review of any available recent lab and radiology data.  Preventive health is updated, specifically  Cancer screening and Immunization.   Questions or concerns regarding consultations or procedures which the PT has had in the interim are  addressed. The PT denies any adverse reactions to current medications since the last visit.  There are no new concerns.  Major concern is in terms of increasingly reduced mobility, denies any falls but is very careful       Review of Systems See HPI Denies recent fever or chills. Denies sinus pressure, nasal congestion, ear pain or sore throat. Denies chest congestion, productive cough or wheezing. Denies chest pains, palpitations and leg swelling Denies abdominal pain, nausea, vomiting,diarrhea or constipation.   Denies dysuria, frequency, hesitancy or incontinence. Denies headaches, seizures, numbness, or tingling. Denies depression, anxiety or insomnia. Denies skin break down or rash.        Objective:   Physical Exam Patient alert and oriented and in no cardiopulmonary distress.  HEENT: No facial asymmetry, EOMI, no sinus tenderness,  oropharynx pink and moist.  Neck supple no adenopathy.  Chest: Clear to auscultation bilaterally.  CVS: S1, S2 no murmurs, no S3.  ABD: Soft non tender. Bowel sounds normal.  Ext: No edema  MS: Decreased ROM spine, shoulders, hips and knees.Marked kyphosis  Skin: Intact, no ulcerations or rash noted.  Psych: Good eye contact, normal affect. Memory intact not anxious or depressed appearing.  CNS: CN 2-12 intact, power, tone and sensation normal throughout.        Assessment & Plan:

## 2012-11-30 NOTE — Patient Instructions (Addendum)
Annual wellness in 5 month   Please ensure you get the flu vaccine  At the pharmacy.  You are being referred for a bne density scan.  Labs are excellent  Look at my chart to follow your labs

## 2012-12-01 ENCOUNTER — Telehealth: Payer: Self-pay | Admitting: Family Medicine

## 2012-12-04 ENCOUNTER — Ambulatory Visit (HOSPITAL_COMMUNITY)
Admission: RE | Admit: 2012-12-04 | Discharge: 2012-12-04 | Disposition: A | Discharge status: Home / Self Care | Payer: 59 | Source: Ambulatory Visit | Attending: Family Medicine | Admitting: Family Medicine

## 2012-12-04 DIAGNOSIS — M8580 Other specified disorders of bone density and structure, unspecified site: Secondary | ICD-10-CM

## 2012-12-04 DIAGNOSIS — M810 Age-related osteoporosis without current pathological fracture: Secondary | ICD-10-CM | POA: Insufficient documentation

## 2012-12-05 ENCOUNTER — Other Ambulatory Visit: Payer: Self-pay | Admitting: Family Medicine

## 2012-12-05 ENCOUNTER — Ambulatory Visit: Payer: 59 | Admitting: Family Medicine

## 2012-12-05 DIAGNOSIS — Q782 Osteopetrosis: Secondary | ICD-10-CM

## 2012-12-13 NOTE — Progress Notes (Signed)
noted 

## 2012-12-17 NOTE — Assessment & Plan Note (Signed)
Followed by GI, currently gERD asymptomatic and controlled on medication

## 2012-12-17 NOTE — Assessment & Plan Note (Signed)
Controlled, no change in medication  

## 2012-12-17 NOTE — Assessment & Plan Note (Signed)
Encouraged to commit to weight bearing exercise as much as possible, would benefit from bisphosphonate, but due to GI issues very wary of trying any

## 2012-12-27 ENCOUNTER — Other Ambulatory Visit: Payer: Self-pay | Admitting: Family Medicine

## 2013-02-19 ENCOUNTER — Encounter: Payer: Self-pay | Admitting: Family Medicine

## 2013-02-19 ENCOUNTER — Ambulatory Visit (INDEPENDENT_AMBULATORY_CARE_PROVIDER_SITE_OTHER): Payer: 59 | Admitting: Family Medicine

## 2013-02-19 VITALS — BP 154/78 | HR 93 | Resp 16 | Ht 66.5 in | Wt 164.0 lb

## 2013-02-19 DIAGNOSIS — M25569 Pain in unspecified knee: Secondary | ICD-10-CM

## 2013-02-19 DIAGNOSIS — M25561 Pain in right knee: Secondary | ICD-10-CM

## 2013-02-19 DIAGNOSIS — I10 Essential (primary) hypertension: Secondary | ICD-10-CM

## 2013-02-19 DIAGNOSIS — K2270 Barrett's esophagus without dysplasia: Secondary | ICD-10-CM

## 2013-02-19 DIAGNOSIS — M542 Cervicalgia: Secondary | ICD-10-CM

## 2013-02-19 DIAGNOSIS — L500 Allergic urticaria: Secondary | ICD-10-CM

## 2013-02-19 MED ORDER — METHOCARBAMOL 500 MG PO TABS: ORAL_TABLET | ORAL | Status: AC

## 2013-02-19 MED ORDER — PREDNISONE 5 MG PO TABS: 5.0000 mg | ORAL_TABLET | Freq: Two times a day (BID) | ORAL | Status: AC

## 2013-02-19 NOTE — Progress Notes (Signed)
  Subjective:    Patient ID: Kerry Flynn, female    DOB: 03-28-1927, 79 y.o.   MRN: [id number]  HPI 2 month h/o pruritic rash involves scalp, abdomen, saw derm end January , has topical med , 50%  Better, but experiencing crawling under skin and itching still Went to urgent care 02/18 due to severe pain in back of neck, spasm, had xrays done, robaxin two tabs twice daily prescribed, will be out of robaxin next Mionday Right knee was hurting 2/18 and currently having right hip pain, has felt subluxation in the knee, unsure about th right hip Has been taking naproxen 200mg  2 twice daily and 2 tylenol   Review of Systems See HPI Denies recent fever or chills. Denies sinus pressure, nasal congestion, ear pain or sore throat. Denies chest congestion, productive cough or wheezing. Denies chest pains, palpitations and leg swelling Denies abdominal pain, nausea, vomiting,diarrhea or constipation.   Denies dysuria, frequency, hesitancy or incontinence.  Denies headaches, seizures, numbness, or tingling. Denies depression, anxiety or insomnia.       Objective:   Physical Exam  Patient alert and oriented and in no cardiopulmonary distress.  HEENT: No facial asymmetry, EOMI, no sinus tenderness,  oropharynx pink and moist.  Neck markedly reduced ROM with trapezius spasm no adenopathy.  Chest: Clear to auscultation bilaterally.  CVS: S1, S2 no murmurs, no S3.  ABD: Soft non tender. Bowel sounds normal.  Ext: No edema  MS: decreased  ROM spine, shoulders, hips and knees.  Skin: erythematous maculopapular rash on trunk  Psych: Good eye contact, normal affect. Memory intact not anxious or depressed appearing.  CNS: CN 2-12 intact, power, tone and sensation normal throughout.       Assessment & Plan:

## 2013-02-19 NOTE — Patient Instructions (Addendum)
F/u  Mid May.  Return to the dermatologist if rash persists.  Prednisone for 5 days is prescribed, this will help with rash, and pain in the neck   Please  Call ortho to re evaluate the right knee and hip, since you have instability (Dr Valma Cava)   You are referred to physical therapy for neck pain and stiffness at [facility], they will call you  Robaxin reduced to one 4 times daily if needed, do not drive while taking so much of this. In the next several weeks the amt of robaxin needed shoul be less, OK to gradually reduce on your own

## 2013-02-20 ENCOUNTER — Other Ambulatory Visit: Payer: Self-pay | Admitting: Family Medicine

## 2013-02-28 ENCOUNTER — Ambulatory Visit (HOSPITAL_COMMUNITY)
Admission: RE | Admit: 2013-02-28 | Discharge: 2013-02-28 | Disposition: A | Discharge status: Home / Self Care | Payer: Medicare Other | Source: Ambulatory Visit | Attending: Family Medicine | Admitting: Family Medicine

## 2013-02-28 DIAGNOSIS — M542 Cervicalgia: Secondary | ICD-10-CM | POA: Insufficient documentation

## 2013-02-28 DIAGNOSIS — M6281 Muscle weakness (generalized): Secondary | ICD-10-CM | POA: Insufficient documentation

## 2013-02-28 DIAGNOSIS — IMO0001 Reserved for inherently not codable concepts without codable children: Secondary | ICD-10-CM | POA: Insufficient documentation

## 2013-02-28 DIAGNOSIS — I10 Essential (primary) hypertension: Secondary | ICD-10-CM | POA: Insufficient documentation

## 2013-02-28 NOTE — Evaluation (Signed)
Physical Therapy Evaluation  Patient Details  Name: JALEYA PEBLEY MRN: [id number] Date of Birth: [birth year] Charge:  eval Today's Date: 02/28/2013 Time: 3086-5784 PT Time Calculation (min): 45 min  Visit#: 1 of 8  Re-eval: 03/30/13 Assessment Diagnosis: neck pain Next MD Visit: 3/14  Authorization: medicare   Authorization Visit#: 1 of 8   Past Medical History:  Past Medical History  Diagnosis Date  . Hemorrhoids   . Intermittent vertigo   . Osteoporosis     knees  . Benign recurrent vertigo   . Vitamin D deficiency   . Head trauma     status post fall; upper and lower extremities   . GERD (gastroesophageal reflux disease)   . Diverticulosis   . Chronic constipation   . Osteoarthritis     of the neck  . Dermatitis     recurrent  . Hypertension    Past Surgical History:  Past Surgical History  Procedure Laterality Date  . Knee arthroscopy  1999    rt  . Knee arthroscopy  1996    left   . Cholecystectomy  2007  . Rotator cuff repair  May 14 2010  . Cataract extraction w/ intraocular lens implant      left, [facility]  . Eye surgery  2013    bilateral cataract extraction  . Colonoscopy  04/19/2012    Procedure: COLONOSCOPY;  Surgeon: Malissa Hippo, MD;  Location: [facility];  Service: Endoscopy;  Laterality: N/A;  1200    Subjective Symptoms/Limitations Symptoms: Ms. Hillebrand states that her neck began to bother her on 02/10/2013.  There was no injury or trauma but she had pain in the left side of her neck that went down her back.  She states that she has DJD and spurs.  She was given predinsone and pain meds and she states she is much better. How long can you sit comfortably?: She is able to sit for an hour and then her neck will be bothering her. How long can you walk comfortably?: The patient states that after walking five minutes she feels like she is unable to raise her head up. Pain Assessment Currently in Pain?: No/denies Pain Score:  (worst  pain 10+/10)   Assessment Cervical AROM Cervical Flexion: normal Cervical Extension: decreased 50% Cervical - Right Side Bend: wfl Cervical - Left Side Bend: wfl Cervical - Right Rotation: decreased 60%% Cervical - Left Rotation: decreased 40% Cervical Strength Cervical Extension: 2/5 Cervical - Right Side Bend: 2/5 Cervical - Left Side Bend: 2/5 Palpation Palpation: multiple mm spasm throughout mid trap area  Exercise/Treatments Mobility/Balance  Posture/Postural Control Posture/Postural Control: Postural limitations Postural Limitations: forward head, increased kyphossis,     seated Exercises Cervical Isometrics: Extension;Right lateral flexion;Left lateral flexion;5 reps Neck Retraction: 5 reps Lateral Flexion: 5 reps Manual Therapy Myofascial Release: w/ suboccipital release; manual traction and jt mob  Physical Therapy Assessment and Plan PT Assessment and Plan Clinical Impression Statement: Pt with poor posture and cervical stabilization who has decreased ROM and strength.  Pt will benefit from skilled Pt to improve ROM, strength and decrased sx of pain. Pt will benefit from skilled therapeutic intervention in order to improve on the following deficits: Pain;Decreased range of motion;Decreased strength;Increased muscle spasms Rehab Potential: Good PT Frequency: Min 2X/week PT Duration: 4 weeks PT Treatment/Interventions: Therapeutic exercise;Therapeutic activities;Manual techniques;Modalities PT Plan: begin T-band for posture; x to v exercises.  3rd treatment begin [place] t-band for decompression; 4th begin wall flex and wall pushups.Marland KitchenMarland Kitchen  Goals Home Exercise Program Pt will Perform Home Exercise Program: Independently PT Short Term Goals Time to Complete Short Term Goals: 2 weeks PT Short Term Goal 1: Pain no greater than a 2/10 PT Short Term Goal 2: ROM improved 20% to allow safer driving PT Long Term Goals Time to Complete Long Term Goals: 4 weeks PT  Long Term Goal 1: I in advance HEP PT Long Term Goal 2: strength increased one grade to allow pt to have less pain Long Term Goal 3: Pain no greater than a 2/10 80% of the time Long Term Goal 4: ROM to improve 30% for safer driving. PT Long Term Goal 5: no mm spasms.  Problem List Patient Active Problem List  Diagnosis  . HYPERTENSION  . CAD, NATIVE VESSEL  . CAROTID ARTERY STENOSIS, WITHOUT INFARCTION  . HEMORRHOIDS  . BARRETTS ESOPHAGUS  . ALLERGIC URTICARIA  . SKIN LESIONS, MULTIPLE  . DEGENERATIVE JOINT DISEASE, KNEES, BILATERAL  . INTERMITTENT VERTIGO  . FATIGUE  . CHEST PAIN, ATYPICAL  . Knee pain, right  . Cystitis  . Abnormal TSH  . Colon polyps  . Bilateral hand pain  . Osteopenia  . Neck pain, bilateral    PT Plan of Care PT Home Exercise Plan: given  GP Functional Assessment Tool Used: neck disability Functional Limitation: Self care Self Care Current Status (W0981): At least 20 percent but less than 40 percent impaired, limited or restricted Self Care Goal Status (X9147): At least 1 percent but less than 20 percent impaired, limited or restricted  RUSSELL,CINDY 02/28/2013, 4:25 PM  Physician Documentation Your signature is required to indicate approval of the treatment plan as stated above.  Please sign and either send electronically or make a copy of this report for your files and return this physician signed original.   Please mark one 1.__approve of plan  2. ___approve of plan with the following conditions.   ______________________________                                                          _____________________ Physician Signature                                                                                                             Date

## 2013-03-01 NOTE — Assessment & Plan Note (Signed)
Acute flare, topical med from derm not working, short course of oral steroid prescribed. Pt to return to derm if no better in 1 week

## 2013-03-01 NOTE — Assessment & Plan Note (Signed)
Under GI surveillance

## 2013-03-01 NOTE — Assessment & Plan Note (Signed)
Increased neck pain and stiffness, referred for PT

## 2013-03-01 NOTE — Assessment & Plan Note (Signed)
Elevated at this visit, no med change. Lifestyle modification only. Generally systolic has tended to be slightly elevated, pt intolerant of med increase

## 2013-03-01 NOTE — Assessment & Plan Note (Signed)
Pt to see orthopedics for re eval since reports instability, she will call for appt

## 2013-03-06 ENCOUNTER — Ambulatory Visit (HOSPITAL_COMMUNITY)
Admission: RE | Admit: 2013-03-06 | Discharge: 2013-03-06 | Disposition: A | Discharge status: Home / Self Care | Payer: Medicare Other | Source: Ambulatory Visit | Attending: Family Medicine | Admitting: Family Medicine

## 2013-03-06 NOTE — Progress Notes (Signed)
out Physical Therapy Treatment Patient Details  Name: Kerry Flynn MRN: [id number] Date of Birth: 1927/11/03  Today's Date: 03/06/2013 Time: 5621-3086 PT Time Calculation (min): 44 min  Visit#: 2 of 8  Re-eval: 03/30/13   Charge:  There ex 16 manual 24 Authorization: medicare  Authorization Time Period:    Authorization Visit#: 2 of 8   Subjective: Symptoms/Limitations Symptoms: Pt states she is doing her exercises.  States she was doing much better until the latch on her oven broke and pulled her arm.   Pain Assessment Pain Score:   2     Exercise/Treatments   Machines for Strengthening UBE (Upper Arm Bike): 4' backward Theraband Exercises Scapula Retraction: 10 reps;Green Shoulder Extension: 10 reps;Green Rows: 10 reps;Green Seated Exercises Cervical Isometrics: Extension;Right lateral flexion;Left lateral flexion;5 reps Neck Retraction: 10 reps Lateral Flexion: 5 reps X to V: 10 reps W Back: 10 reps Shoulder Shrugs: 5 reps   Manual Therapy Manual Therapy: Myofascial release Myofascial Release: w/ manual tractiion; PROM, jt mobs and supocciipital release.  Physical Therapy Assessment and Plan PT Assessment and Plan Clinical Impression Statement: Began t-band ex for postrue without difficulty; added w-back and c retraction to improve posture.  Pt pain 0/10 at end of treatment PT Frequency: Min 2X/week PT Duration: 4 weeks PT Treatment/Interventions: Therapeutic exercise;Therapeutic activities;Manual techniques;Modalities PT Plan:   3rd treatment begin [employer] t-band for decompression; 4th begin wall flex and wall pushups...    Goals    Problem List Patient Active Problem List  Diagnosis  . HYPERTENSION  . CAD, NATIVE VESSEL  . CAROTID ARTERY STENOSIS, WITHOUT INFARCTION  . HEMORRHOIDS  . BARRETTS ESOPHAGUS  . ALLERGIC URTICARIA  . SKIN LESIONS, MULTIPLE  . DEGENERATIVE JOINT DISEASE, KNEES, BILATERAL  . INTERMITTENT VERTIGO  . FATIGUE  . CHEST  PAIN, ATYPICAL  . Knee pain, right  . Cystitis  . Abnormal TSH  . Colon polyps  . Bilateral hand pain  . Osteopenia  . Neck pain, bilateral    General Behavior During Session: [facility] for tasks performed Cognition: [facility] for tasks performed  GP    Kerry Flynn 03/06/2013, 12:04 PM

## 2013-03-08 ENCOUNTER — Ambulatory Visit (HOSPITAL_COMMUNITY)
Admission: RE | Admit: 2013-03-08 | Discharge: 2013-03-08 | Disposition: A | Discharge status: Home / Self Care | Payer: Medicare Other | Source: Ambulatory Visit | Attending: Family Medicine | Admitting: Family Medicine

## 2013-03-08 NOTE — Progress Notes (Signed)
Physical Therapy Treatment Patient Details  Name: Kerry Flynn MRN: [id number] Date of Birth: August 31, 1927  Today's Date: 03/08/2013 Time: 1010-1055 PT Time Calculation (min): 45 min  Visit#: 3 of 8  Re-eval: 03/30/13 Charges: Therex x 30' Manual x 10'  Authorization: medicare  Authorization Visit#: 3 of 8   Subjective: Symptoms/Limitations Symptoms: Pt reports continued HEP compliance. Pain Assessment Currently in Pain?: No/denies   Exercise/Treatments Machines for Strengthening UBE (Upper Arm Bike): 4'@1 .0 backward Theraband Exercises Scapula Retraction: 10 reps;Green Shoulder Extension: 10 reps;Green Rows: 10 reps;Green Seated Exercises Cervical Isometrics:  (HEP) Neck Retraction: 10 reps Lateral Flexion: 5 reps X to V: 10 reps W Back: 10 reps Shoulder Shrugs: 10 reps Supine Exercises Other Supine Exercise: Meek's decompressive tband exercises  Manual Therapy Manual Therapy: Myofascial release Myofascial Release: MFR to bil sternocleidomastoid   Physical Therapy Assessment and Plan PT Assessment and Plan Clinical Impression Statement: Pt requires multimodal cueing with scapular tband exercises to improve form. Pt displays decreased strength and coordination in scapular musculature. Began Meek's decompressed tband exercises with minimal difficulty. Tightness noted in bil. sternocleidomastoid Pt reports decreased tightness at end of session. PT Plan: Continue to progress per PT POC. Begin wall flex and wall pushups next session.     Problem List Patient Active Problem List  Diagnosis  . HYPERTENSION  . CAD, NATIVE VESSEL  . CAROTID ARTERY STENOSIS, WITHOUT INFARCTION  . HEMORRHOIDS  . BARRETTS ESOPHAGUS  . ALLERGIC URTICARIA  . SKIN LESIONS, MULTIPLE  . DEGENERATIVE JOINT DISEASE, KNEES, BILATERAL  . INTERMITTENT VERTIGO  . FATIGUE  . CHEST PAIN, ATYPICAL  . Knee pain, right  . Cystitis  . Abnormal TSH  . Colon polyps  . Bilateral hand pain  .  Osteopenia  . Neck pain, bilateral    General Behavior During Session: [facility] for tasks performed Cognition: [facility] for tasks performed PT Plan of Care PT Home Exercise Plan: Decompressive tband exercises   Seth Bake, PTA  03/08/2013, 11:09 AM

## 2013-03-09 MED ORDER — ONDANSETRON HCL 4 MG/2ML IJ SOLN
INTRAMUSCULAR | Status: AC
  Filled 2013-03-09: qty 2

## 2013-03-09 MED ORDER — GLYCOPYRROLATE 0.2 MG/ML IJ SOLN
INTRAMUSCULAR | Status: AC
  Filled 2013-03-09: qty 1

## 2013-03-09 MED ORDER — MIDAZOLAM HCL 2 MG/2ML IJ SOLN
INTRAMUSCULAR | Status: AC
  Filled 2013-03-09: qty 2

## 2013-03-13 ENCOUNTER — Ambulatory Visit (HOSPITAL_COMMUNITY)
Admission: RE | Admit: 2013-03-13 | Discharge: 2013-03-13 | Disposition: A | Discharge status: Home / Self Care | Payer: Medicare Other | Source: Ambulatory Visit | Attending: Family Medicine | Admitting: Family Medicine

## 2013-03-13 NOTE — Progress Notes (Signed)
Physical Therapy Treatment Patient Details  Name: Kerry Flynn MRN: [id number] Date of Birth: 1927/09/14  Today's Date: 03/13/2013 Time: 1007-1100 PT Time Calculation (min): 53 min  Visit#: 4 of 8  Re-eval: 03/30/13 Charges: Therex x 30' Manual x 15'  Authorization: medicare  Authorization Visit#: 4 of 8   Subjective: Symptoms/Limitations Symptoms: Pt stated minimal pain, does get tired and then has trouble keeping head up. Pain Assessment Currently in Pain?: Yes Pain Score:   1 Pain Location: Neck   Exercise/Treatments Machines for Strengthening UBE (Upper Arm Bike): 6'@1 .0 backward Theraband Exercises Scapula Retraction: 10 reps;Green Shoulder Extension: 10 reps;Green Rows: 10 reps;Green Standing Exercises Upper Extremity Flexion with Stabilization: 10 reps;Flexion;Limitations UE Flexion with Stabilization Limitations: Against wall with towel roll behind head Seated Exercises Neck Retraction: 10 reps Lateral Flexion: 10 reps X to V: 10 reps W Back: 10 reps Shoulder Shrugs: 10 reps  Physical Therapy Assessment and Plan PT Assessment and Plan Clinical Impression Statement: Pt has most difficulty with cervical retraction secondary to weakness. Pt also requires vc's to improve scapular retraction secondary to decrease coordination. Pt tends to lean back on chair back when completing seated exercises due to decreased postural stability. Manual techniques completed to cervical are to decrease pain and tightness. Pt reports pain decrease to 0/10 at end of session. PT Plan: Continue to progress per PT POC. Begin wall pushups next session. Focus on improving posture.     Problem List Patient Active Problem List  Diagnosis  . HYPERTENSION  . CAD, NATIVE VESSEL  . CAROTID ARTERY STENOSIS, WITHOUT INFARCTION  . HEMORRHOIDS  . BARRETTS ESOPHAGUS  . ALLERGIC URTICARIA  . SKIN LESIONS, MULTIPLE  . DEGENERATIVE JOINT DISEASE, KNEES, BILATERAL  . INTERMITTENT VERTIGO  .  FATIGUE  . CHEST PAIN, ATYPICAL  . Knee pain, right  . Cystitis  . Abnormal TSH  . Colon polyps  . Bilateral hand pain  . Osteopenia  . Neck pain, bilateral    General Behavior During Session: [facility] for tasks performed Cognition: [facility] for tasks performed  Seth Bake, PTA  03/13/2013, 11:27 AM

## 2013-03-15 ENCOUNTER — Ambulatory Visit (HOSPITAL_COMMUNITY)
Admission: RE | Admit: 2013-03-15 | Discharge: 2013-03-15 | Disposition: A | Discharge status: Home / Self Care | Payer: Medicare Other | Source: Ambulatory Visit | Attending: Family Medicine | Admitting: Family Medicine

## 2013-03-15 NOTE — Progress Notes (Signed)
Physical Therapy Treatment Patient Details  Name: Kerry Flynn MRN: [id number] Date of Birth: 05-11-1927  Today's Date: 03/15/2013 Time: 1110-1155 PT Time Calculation (min): 45 min  Visit#: 5 of 8  Re-eval: 03/30/13 Authorization: medicare  Authorization Visit#: 5 of 8  Charges: therex 20', manual 24'  Subjective: Symptoms/Limitations Symptoms: Pt. states she is improving alot.  States she is off the robaxin now. Pain Assessment Currently in Pain?: No/denies   Exercise/Treatments Machines for Strengthening UBE (Upper Arm Bike): 6'@1 .0 backward Theraband Exercises Scapula Retraction: 15 reps;Green Shoulder Extension: 15 reps;Green Rows: 15 reps;Green Standing Exercises Wall Push Ups: 10 reps Upper Extremity Flexion with Stabilization: 10 reps;Flexion;Limitations UE Flexion with Stabilization Limitations: Against wall with towel roll behind head Seated Exercises Neck Retraction: 15 reps;5 secs Lateral Flexion: 10 reps X to V: 10 reps W Back: 10 reps Shoulder Shrugs: 15 reps      Manual Therapy Manual Therapy: Other (comment) Myofascial Release: to Bilateral sternocleidomastoid muscles to decrease adhesions Other Manual Therapy: Bilateral sternocleidomastoid, upper traps to decrease adhesions and spasms, Occipital release  Physical Therapy Assessment and Plan PT Assessment and Plan Clinical Impression Statement: Pt. continues to require cues to perform exercises in good form with head upright and shoulders back.  MFR and STM techniques completed to bilateral SCM and UT with good results.  Spasms in Bilateral UT's resolved completely with manual.  Occipital release completed with noted tightness.  Added wall push ups with good form.  PT Plan: Continue to progress per PT POC. Focus on improving posture.     Problem List Patient Active Problem List  Diagnosis  . HYPERTENSION  . CAD, NATIVE VESSEL  . CAROTID ARTERY STENOSIS, WITHOUT INFARCTION  . HEMORRHOIDS  .  BARRETTS ESOPHAGUS  . ALLERGIC URTICARIA  . SKIN LESIONS, MULTIPLE  . DEGENERATIVE JOINT DISEASE, KNEES, BILATERAL  . INTERMITTENT VERTIGO  . FATIGUE  . CHEST PAIN, ATYPICAL  . Knee pain, right  . Cystitis  . Abnormal TSH  . Colon polyps  . Bilateral hand pain  . Osteopenia  . Neck pain, bilateral    General Behavior During Session: [facility] for tasks performed Cognition: [facility] for tasks performed   Lurena Nida, PTA/CLT 03/15/2013, 12:55 PM

## 2013-03-20 ENCOUNTER — Ambulatory Visit (HOSPITAL_COMMUNITY)
Admission: RE | Admit: 2013-03-20 | Discharge: 2013-03-20 | Disposition: A | Discharge status: Home / Self Care | Payer: Medicare Other | Source: Ambulatory Visit | Attending: Family Medicine | Admitting: Family Medicine

## 2013-03-20 NOTE — Progress Notes (Signed)
Physical Therapy Treatment Patient Details  Name: Kerry Flynn MRN: [id number] Date of Birth: 1927/09/13  Today's Date: 03/20/2013 Time: 1101-1144 PT Time Calculation (min): 43 min  Visit#: 6 of 8  Re-eval: 03/30/13 Charges: Manual x 10' Therex x 30'  Authorization: medicare  Authorization Visit#: 6 of 8   Subjective: Symptoms/Limitations Symptoms: Pt reports no pain only soreness and tightness that she attributes to doing too much house work.  Pain Assessment Currently in Pain?: No/denies  Precautions/Restrictions     Exercise/Treatments Stretches Upper Trapezius Stretch: 2 reps;30 seconds Machines for Strengthening UBE (Upper Arm Bike): 6'@1 .0 backward Theraband Exercises Scapula Retraction:  (HEP) Shoulder Extension:  (HEP) Rows:  (HEP) Standing Exercises Wall Push Ups: 10 reps Upper Extremity Flexion with Stabilization: 10 reps;Flexion;Limitations UE Flexion with Stabilization Limitations: Against wall with towel roll behind head Seated Exercises Neck Retraction: 15 reps;5 secs X to V: 10 reps W Back: 15 reps Shoulder Shrugs: 15 reps  Manual Therapy Manual Therapy: Myofascial release Myofascial Release: Bilateral sternocleidomastoid, upper traps to decrease adhesions and spasms  Physical Therapy Assessment and Plan PT Assessment and Plan Clinical Impression Statement: Pt displays improve capital flexor strength and cervical coordination. Manual techniques completes to B cervical musculature to decrease pain and tightness. Pt reports decreased soreness and tightness at end of session. PT Plan: Continue to progress per PT POC. Focus on improving posture.     Problem List Patient Active Problem List  Diagnosis  . HYPERTENSION  . CAD, NATIVE VESSEL  . CAROTID ARTERY STENOSIS, WITHOUT INFARCTION  . HEMORRHOIDS  . BARRETTS ESOPHAGUS  . ALLERGIC URTICARIA  . SKIN LESIONS, MULTIPLE  . DEGENERATIVE JOINT DISEASE, KNEES, BILATERAL  . INTERMITTENT VERTIGO   . FATIGUE  . CHEST PAIN, ATYPICAL  . Knee pain, right  . Cystitis  . Abnormal TSH  . Colon polyps  . Bilateral hand pain  . Osteopenia  . Neck pain, bilateral    General Behavior During Session: [facility] for tasks performed Cognition: [facility] for tasks performed  Seth Bake, PTA  03/20/2013, 12:05 PM

## 2013-03-21 MED ORDER — MINERAL OIL PO OIL
TOPICAL_OIL | ORAL | Status: AC
  Filled 2013-03-21: qty 30

## 2013-03-22 ENCOUNTER — Ambulatory Visit (HOSPITAL_COMMUNITY)
Admission: RE | Admit: 2013-03-22 | Discharge: 2013-03-22 | Disposition: A | Discharge status: Home / Self Care | Payer: Medicare Other | Source: Ambulatory Visit | Attending: Family Medicine | Admitting: Family Medicine

## 2013-03-22 NOTE — Progress Notes (Signed)
Physical Therapy Treatment Patient Details  Name: Kerry Flynn MRN: [id number] Date of Birth: 1927-06-17  Today's Date: 03/22/2013 Time: 1104-1150 PT Time Calculation (min): 46 min Charge: there ex x 16; manual x 24 Visit#: 7 of 8  Re-eval: 03/30/13    Authorization: medicare  Authorization Time Period:    Authorization Visit#: 7 of 8   Subjective: Symptoms/Limitations Symptoms: Pt states that she is doing all her exercises at home.    Exercise/Treatments Mobility/Balance        UBE x 4'   Prone Exercises Axial Exentsion: 10 reps W Back: 5 reps Shoulder Extension: 5 reps Rows: 5 reps     Manual Therapy Manual Therapy: Myofascial release Myofascial Release: Bilateral sternocleidomastoid as well as scalene; Suboccipital release with gentle traction with PROM  Physical Therapy Assessment and Plan PT Assessment and Plan Clinical Impression Statement: Pt improving in ROM as well as standing posture.  Added prone exercises to continue strengthening postural mm Pt will benefit from skilled therapeutic intervention in order to improve on the following deficits: Pain;Decreased range of motion;Decreased strength;Increased muscle spasms Rehab Potential: Good PT Frequency: Min 2X/week PT Duration: 4 weeks PT Treatment/Interventions: Therapeutic exercise;Therapeutic activities;Manual techniques;Modalities PT Plan: reevaluate next treatement        Problem List Patient Active Problem List  Diagnosis  . HYPERTENSION  . CAD, NATIVE VESSEL  . CAROTID ARTERY STENOSIS, WITHOUT INFARCTION  . HEMORRHOIDS  . BARRETTS ESOPHAGUS  . ALLERGIC URTICARIA  . SKIN LESIONS, MULTIPLE  . DEGENERATIVE JOINT DISEASE, KNEES, BILATERAL  . INTERMITTENT VERTIGO  . FATIGUE  . CHEST PAIN, ATYPICAL  . Knee pain, right  . Cystitis  . Abnormal TSH  . Colon polyps  . Bilateral hand pain  . Osteopenia  . Neck pain, bilateral    General Behavior During Session: [facility] for tasks  performed Cognition: [facility] for tasks performed  GP Functional Assessment Tool Used: neck disability  Kyeshia Zinn,CINDY 03/22/2013, 12:35 PM

## 2013-03-28 ENCOUNTER — Ambulatory Visit (HOSPITAL_COMMUNITY)
Admission: RE | Admit: 2013-03-28 | Discharge: 2013-03-28 | Disposition: A | Discharge status: Home / Self Care | Payer: Medicare Other | Source: Ambulatory Visit | Attending: Family Medicine | Admitting: Family Medicine

## 2013-03-28 DIAGNOSIS — I10 Essential (primary) hypertension: Secondary | ICD-10-CM | POA: Insufficient documentation

## 2013-03-28 DIAGNOSIS — M542 Cervicalgia: Secondary | ICD-10-CM | POA: Insufficient documentation

## 2013-03-28 DIAGNOSIS — M6281 Muscle weakness (generalized): Secondary | ICD-10-CM | POA: Insufficient documentation

## 2013-03-28 DIAGNOSIS — IMO0001 Reserved for inherently not codable concepts without codable children: Secondary | ICD-10-CM | POA: Insufficient documentation

## 2013-03-28 NOTE — Evaluation (Signed)
Physical Therapy reevaluation/discharge  Patient Details  Name: KLYNN LINNEMANN MRN: [id number] Date of Birth: Apr 15, 1927 Charge: MM test, ROM test, Manual x 20' Today's Date: 03/28/2013 Time: 9528-4132 PT Time Calculation (min): 39 min              Visit#: 8 of 8  Assessment Diagnosis: neck pain Next MD Visit: 04/26/2013  Authorization: medicare     Authorization Visit#: 8 of 8   Past Medical History:  Past Medical History  Diagnosis Date  . Hemorrhoids   . Intermittent vertigo   . Osteoporosis     knees  . Benign recurrent vertigo   . Vitamin D deficiency   . Head trauma     status post fall; upper and lower extremities   . GERD (gastroesophageal reflux disease)   . Diverticulosis   . Chronic constipation   . Osteoarthritis     of the neck  . Dermatitis     recurrent  . Hypertension    Past Surgical History:  Past Surgical History  Procedure Laterality Date  . Knee arthroscopy  1999    rt  . Knee arthroscopy  1996    left   . Cholecystectomy  2007  . Rotator cuff repair  May 14 2010  . Cataract extraction w/ intraocular lens implant      left, [facility]  . Eye surgery  2013    bilateral cataract extraction  . Colonoscopy  04/19/2012    Procedure: COLONOSCOPY;  Surgeon: Malissa Hippo, MD;  Location: [facility];  Service: Endoscopy;  Laterality: N/A;  1200    Subjective Symptoms/Limitations Symptoms: Ms. Gardiner Coins states that her neck feels 100% better since beginning therapy. How long can you sit comfortably?: She is able to sit for an hour and a half to two hours now was one hour.  Driving is more difficult with pain starting about 45 minutes into driving. How long can you walk comfortably?: The patient states that after walking five minutes she feels like she is unable to raise her head up. Pain Assessment Currently in Pain?: No/denies (highest 2/10) Pain Location: Neck    Assessment Cervical AROM Cervical Flexion: normal Cervical Extension:  normal was decreased 50% Cervical - Right Side Bend: wfl Cervical - Left Side Bend: wfl Cervical - Right Rotation: decreased 15% was decreased 60%% Cervical - Left Rotation: decreased 20% was decreased 40%  Cervical Strength Cervical Extension: 3/5 (was 2/5) Cervical - Right Side Bend: 3/5 (was 2/5) Cervical - Left Side Bend: 3/5 (was 2/5) Palpation Palpation: multiple mm spasm throughout mid trap area  Exercise/Treatments Mobility/Balance  Posture/Postural Control Posture/Postural Control: Postural limitations Postural Limitations: forward head, increased kyphossis,     Pt received myofascial release,suboccipital release and manual traction. Jt mob grade II C2-C5  bilaterally  Physical Therapy Assessment and Plan PT Assessment and Plan Clinical Impression Statement: Pt has no pain only soreness, ROM is improved as well as strength.  Pt is I in HEP and feels she is ready to be discharged. All goals are met. PT Plan: discharge to HEP    Goals Home Exercise Program PT Goal: Perform Home Exercise Program - Progress: Met PT Short Term Goals PT Short Term Goal 1: Pain no greater than a 2/10 PT Short Term Goal 1 - Progress: Met PT Short Term Goal 2: ROM improved 20% to allow safer driving PT Short Term Goal 2 - Progress: Met PT Long Term Goals PT Long Term Goal 1: I in advance HEP PT  Long Term Goal 1 - Progress: Met PT Long Term Goal 2: strength increased one grade to allow pt to have less pain Long Term Goal 3: Pain no greater than a 2/10 80% of the time Long Term Goal 3 Progress: Met Long Term Goal 4: ROM to improve 30% for safer driving. Long Term Goal 4 Progress: Met PT Long Term Goal 5: no mm spasms. Long Term Goal 5 Progress: Progressing toward goal  Problem List Patient Active Problem List  Diagnosis  . HYPERTENSION  . CAD, NATIVE VESSEL  . CAROTID ARTERY STENOSIS, WITHOUT INFARCTION  . HEMORRHOIDS  . BARRETTS ESOPHAGUS  . ALLERGIC URTICARIA  . SKIN LESIONS,  MULTIPLE  . DEGENERATIVE JOINT DISEASE, KNEES, BILATERAL  . INTERMITTENT VERTIGO  . FATIGUE  . CHEST PAIN, ATYPICAL  . Knee pain, right  . Cystitis  . Abnormal TSH  . Colon polyps  . Bilateral hand pain  . Osteopenia  . Neck pain, bilateral    General Behavior During Session: [facility] for tasks performed  GP Functional Assessment Tool Used: neck disability Functional Limitation: Self care Self Care Goal Status ([id number]): At least 1 percent but less than 20 percent impaired, limited or restricted Self Care Discharge Status [phone]): At least 1 percent but less than 20 percent impaired, limited or restricted  RUSSELL,CINDY 03/28/2013, 4:04 PM  Physician Documentation Your signature is required to indicate approval of the treatment plan as stated above.  Please sign and either send electronically or make a copy of this report for your files and return this physician signed original.   Please mark one 1.__approve of plan  2. ___approve of plan with the following conditions.   ______________________________                                                          _____________________ Physician Signature                                                                                                             Date

## 2013-05-03 ENCOUNTER — Encounter: Payer: Self-pay | Admitting: Family Medicine

## 2013-05-03 ENCOUNTER — Ambulatory Visit (INDEPENDENT_AMBULATORY_CARE_PROVIDER_SITE_OTHER): Payer: Medicare Other | Admitting: Family Medicine

## 2013-05-03 VITALS — BP 160/78 | HR 67 | Resp 16 | Ht 66.5 in | Wt 162.0 lb

## 2013-05-03 DIAGNOSIS — IMO0002 Reserved for concepts with insufficient information to code with codable children: Secondary | ICD-10-CM

## 2013-05-03 DIAGNOSIS — M1710 Unilateral primary osteoarthritis, unspecified knee: Secondary | ICD-10-CM

## 2013-05-03 DIAGNOSIS — Z0000 Encounter for general adult medical examination without abnormal findings: Secondary | ICD-10-CM

## 2013-05-03 MED ORDER — CELECOXIB 200 MG PO CAPS: 200.0000 mg | ORAL_CAPSULE | Freq: Every day | ORAL | Status: DC

## 2013-05-03 NOTE — Patient Instructions (Addendum)
F/u mid to end October, please call if you need me before  We will try to get celebrex in place of alleve  Pls call your cardiologist and also the orthopedic Doctor for evaluation of heart and knee  I am happy that the physical therapy was so beneficial

## 2013-05-03 NOTE — Progress Notes (Signed)
Subjective:    Patient ID: Kerry Flynn, female    DOB: 24-Oct-1927, 79 y.o.   MRN: [id number]  HPI Preventive Screening-Counseling & Management   Patient present here today for a Medicare annual wellness visit.   Current Problems (verified)   Medications Prior to Visit Allergies (verified)   PAST HISTORY  Family History: no family h/o depression or dementia  Social History  Married , quit ncotine over 40 years, no alcohol, no street drugs.No children, niece responsible for the affairs of herself and her spouse  Risk Factors  Current exercise habits:  Daily 30 min to 1 hr  Dietary issues discussed:Veg fruit , not much sweets and carbs, nuts and dark chocolate   Cardiac risk factors: CAD native vessel establishe, recent c/o chest discomfort with activity, pt states she will call for appt with her cardiologist  Depression Screen  (Note: if answer to either of the following is "Yes", a more complete depression screening is indicated)   Over the past two weeks, have you felt down, depressed or hopeless? No  Over the past two weeks, have you felt little interest or pleasure in doing things? No  Have you lost interest or pleasure in daily life? No  Do you often feel hopeless? No  Do you cry easily over simple problems? No   Activities of Daily Living  In your present state of health, do you have any difficulty performing the following activities?  Driving?: no, marked improvement in neck mobility with recent PT Managing money?: No Feeding yourself?:No Getting from bed to chair?:yes Climbing a flight of stairs?:yes, has a stair left in her home, will be reevaluated for knee replacement, due to instability , decreased mobility an fall risk Preparing food and eating?:No Bathing or showering?:No Getting dressed?:No Getting to the toilet?:No Using the toilet?:No Moving around from place to place?: yes, ambulates with a cane Fall Risk Assessment In the past year have you  fallen or had a near fall?:No Are you currently taking any medications that make you dizziness?:No   Hearing Difficulties: No Do you often ask people to speak up or repeat themselves?:No Do you experience ringing or noises in your ears?:No Do you have difficulty understanding soft or whispered voices?:No  Cognitive Testing  Alert? Yes Normal Appearance?Yes  Oriented to person? Yes Place? Yes  Time? Yes  Displays appropriate judgment?Yes  Can read the correct time from a watch face? yes Are you having problems remembering things?No  Advanced Directives have been discussed with the patient?Yes ,. Full code, but does not want to be in a vegetative state   List the Names of Other Physician/Practitioners you currently use: Dr Karilyn Cota, Dr Allyson Sabal, orthopeidc Doc and dermatology as needed   Indicate any recent Medical Services you may have received from other than [facility] providers in the past year (date may be approximate).   Assessment:    Annual Wellness Exam   Plan:    During the course of the visit the patient was educated and counseled about appropriate screening and preventive services including:  A healthy diet is rich in fruit, vegetables and whole grains. Poultry fish, nuts and beans are a healthy choice for protein rather then red meat. A low sodium diet and drinking 64 ounces of water daily is generally recommended. Oils and sweet should be limited. Carbohydrates especially for those who are diabetic or overweight, should be limited to 30-45 gram per meal. It is important to eat on a regular schedule, at least 3 times  daily. Snacks should be primarily fruits, vegetables or nuts. It is important that you exercise regularly at least 30 minutes 5 times a week. If you develop chest pain, have severe difficulty breathing, or feel very tired, stop exercising immediately and seek medical attention  Immunization reviewed and updated. Cancer screening reviewed and updated    Patient  Instructions (the written plan) was given to the patient.  Medicare Attestation  I have personally reviewed:  The patient's medical and social history  Their use of alcohol, tobacco or illicit drugs  Their current medications and supplements  The patient's functional ability including ADLs,fall risks, home safety risks, cognitive, and hearing and visual impairment  Diet and physical activities  Evidence for depression or mood disorders  The patient's weight, height, BMI, and visual acuity have been recorded in the chart. I have made referrals, counseling, and provided education to the patient based on review of the above and I have provided the patient with a written personalized care plan for preventive services.      Review of Systems     Objective:   Physical Exam        Assessment & Plan:

## 2013-05-06 DIAGNOSIS — Z0000 Encounter for general adult medical examination without abnormal findings: Secondary | ICD-10-CM | POA: Insufficient documentation

## 2013-05-06 NOTE — Assessment & Plan Note (Signed)
Annual wellness as documented. Most marked impairment is in mobility, pt uses a cane to ambulate, has a stair lift in her home, has not fallen in the past year. Now interested in re visiting possible knee replacement and will make her own appt , worse knee is the left. Recently experiencing chest discomfort and tightness with exertion, states she will schedule her own cardiology follow up also Advanced directives have already been discussed with her niece who is her responsible paryty after her spouse

## 2013-06-18 ENCOUNTER — Other Ambulatory Visit: Payer: Self-pay | Admitting: Family Medicine

## 2013-07-19 ENCOUNTER — Encounter: Payer: Self-pay | Admitting: Cardiovascular Disease

## 2013-07-19 ENCOUNTER — Ambulatory Visit (INDEPENDENT_AMBULATORY_CARE_PROVIDER_SITE_OTHER): Payer: Medicare Other | Admitting: Cardiovascular Disease

## 2013-07-19 VITALS — BP 140/80 | HR 77 | Ht 66.5 in | Wt 161.0 lb

## 2013-07-19 DIAGNOSIS — I10 Essential (primary) hypertension: Secondary | ICD-10-CM

## 2013-07-19 NOTE — Progress Notes (Signed)
07/19/2013 Kerry Flynn   [birth year]  [id number]  Primary Physician Kerry Overman, MD Primary Cardiologist: Kerry Gess MD Kerry Flynn   HPI:  The patient is a very pleasant 79 year old, thin appearing, married Caucasian female, mother of no children who I last saw back in the office 3 months ago. She has a history of hypertension and insignificant CAD by cath performed by Dr. Jeanella Flynn September 02, 2005 at the [facility]. She was complaining of progressive dyspnea and substernal chest pain with left upper extremity radiation. A Myoview performed several days ago was completely normal, as was a 2D echo. She has a favorable lipid profile for primary prevention. She apparently had Barrett's esophagus and is being followed by Dr. Karilyn Flynn with a history of peptic ulcer. Since I saw her in the office 03/02/12 she has been relatively asymptomatic except for generalized weakness. She specifically denies chest pain but does have dyspnea which is a chronic problem.     Current Outpatient Prescriptions  Medication Sig Dispense Refill  . acetaminophen (TYLENOL) 500 MG tablet Take 1,000 mg by mouth every 6 (six) hours as needed. pain      . amLODipine (NORVASC) 5 MG tablet TAKE 1 TABLET BY MOUTH ONCE DAILY  30 tablet  3  . Calcium-Vitamin D-Vitamin K 500-100-40 MG-UNT-MCG CHEW Chew 1 tablet by mouth daily.      Marland Kitchen estradiol (ESTRACE) 0.1 MG/GM vaginal cream Place 2 g vaginally every 7 (seven) days.      . Magnesium Oxide (PHILLIPS) 500 MG (LAX) TABS Take 1 tablet by mouth daily.       . meclizine (ANTIVERT) 12.5 MG tablet Take 1 tablet (12.5 mg total) by mouth as needed. vertigo  30 tablet  0  . Misc Natural Products (COSAMIN ASU ADVANCED FORMULA) CAPS Take 1 capsule by mouth 3 (three) times daily with meals.       . Multiple Vitamin (MULTIVITAMIN) tablet Take 1 tablet by mouth daily.        . naproxen sodium (ANAPROX) 220 MG tablet Take 220 mg by mouth as needed.      .  nitroGLYCERIN (NITROSTAT) 0.4 MG SL tablet Place 0.4 mg under the tongue every 5 (five) minutes as needed. 1 tablet under the tongue at onset of chest pain: you may repeat every 5 minutes for up to 3 doses      . omeprazole (PRILOSEC) 20 MG capsule Take 1 capsule (20 mg total) by mouth 2 (two) times daily before a meal.  60 capsule  5  . Probiotic Product (HEALTHY COLON) CAPS Take by mouth daily.       . ramipril (ALTACE) 10 MG capsule TAKE ONE CAPSULE BY MOUTH EVERY DAY  30 capsule  4  . Wheat Dextrin (BENEFIBER) POWD Take 2 scoop by mouth 2 (two) times daily.        No current facility-administered medications for this visit.    Allergies  Allergen Reactions  . Clarithromycin   . Flagyl (Metronidazole)   . Penicillins     History   Social History  . Marital Status: Married    Spouse Name: N/A    Number of Children: N/A  . Years of Education: N/A   Occupational History  . retired    Social History Main Topics  . Smoking status: Former Smoker -- 0.25 packs/day for .5 years  . Smokeless tobacco: Not on file  . Alcohol Use: 0.6 oz/week    1 Glasses of wine per  week     Comment: occassionally wine with a meal  . Drug Use: No  . Sexually Active: Not on file   Other Topics Concern  . Not on file   Social History Narrative  . No narrative on file     Review of Systems: General: negative for chills, fever, night sweats or weight changes.  Cardiovascular: negative for chest pain, dyspnea on exertion, edema, orthopnea, palpitations, paroxysmal nocturnal dyspnea or shortness of breath Dermatological: negative for rash Respiratory: negative for cough or wheezing Urologic: negative for hematuria Abdominal: negative for nausea, vomiting, diarrhea, bright red blood per rectum, melena, or hematemesis Neurologic: negative for visual changes, syncope, or dizziness All other systems reviewed and are otherwise negative except as noted above.    Blood pressure 140/80, pulse 77,  height 5' 6.5" (1.689 m), weight 161 lb (73.029 kg).  General appearance: alert and no distress Neck: no adenopathy, no carotid bruit, no JVD, supple, symmetrical, trachea midline and thyroid not enlarged, symmetric, no tenderness/mass/nodules Lungs: clear to auscultation bilaterally Heart: regular rate and rhythm, S1, S2 normal, no murmur, click, rub or gallop Extremities: extremities normal, atraumatic, no cyanosis or edema  EKG normal sinus rhythm at 77 without ST or T wave changes  ASSESSMENT AND PLAN:   HYPERTENSION Well-controlled on current medications      Kerry Gess MD [facility], [facility] 07/19/2013 1:07 PM

## 2013-07-19 NOTE — Patient Instructions (Addendum)
Your physician wants you to follow-up in: 12 months with Dr Berry. You will receive a reminder letter in the mail two months in advance. If you don't receive a letter, please call our office to schedule the follow-up appointment.  

## 2013-07-19 NOTE — Assessment & Plan Note (Signed)
Well-controlled on current medications 

## 2013-07-27 ENCOUNTER — Other Ambulatory Visit: Payer: Self-pay | Admitting: Family Medicine

## 2013-08-15 ENCOUNTER — Telehealth: Payer: Self-pay | Admitting: Family Medicine

## 2013-08-16 NOTE — Telephone Encounter (Signed)
Noted  

## 2013-10-09 ENCOUNTER — Ambulatory Visit: Payer: Medicare Other | Admitting: Family Medicine

## 2013-10-23 ENCOUNTER — Encounter: Payer: Self-pay | Admitting: Family Medicine

## 2013-10-23 ENCOUNTER — Ambulatory Visit (INDEPENDENT_AMBULATORY_CARE_PROVIDER_SITE_OTHER): Payer: Medicare Other | Admitting: Family Medicine

## 2013-10-23 VITALS — BP 140/80 | HR 62 | Resp 16 | Wt 162.0 lb

## 2013-10-23 DIAGNOSIS — M545 Low back pain, unspecified: Secondary | ICD-10-CM | POA: Insufficient documentation

## 2013-10-23 DIAGNOSIS — R6889 Other general symptoms and signs: Secondary | ICD-10-CM

## 2013-10-23 DIAGNOSIS — M546 Pain in thoracic spine: Secondary | ICD-10-CM

## 2013-10-23 DIAGNOSIS — R5381 Other malaise: Secondary | ICD-10-CM

## 2013-10-23 DIAGNOSIS — R7989 Other specified abnormal findings of blood chemistry: Secondary | ICD-10-CM

## 2013-10-23 DIAGNOSIS — K635 Polyp of colon: Secondary | ICD-10-CM

## 2013-10-23 DIAGNOSIS — Z23 Encounter for immunization: Secondary | ICD-10-CM

## 2013-10-23 DIAGNOSIS — N3941 Urge incontinence: Secondary | ICD-10-CM

## 2013-10-23 DIAGNOSIS — Z13220 Encounter for screening for lipoid disorders: Secondary | ICD-10-CM

## 2013-10-23 DIAGNOSIS — I10 Essential (primary) hypertension: Secondary | ICD-10-CM

## 2013-10-23 DIAGNOSIS — Q782 Osteopetrosis: Secondary | ICD-10-CM

## 2013-10-23 DIAGNOSIS — D126 Benign neoplasm of colon, unspecified: Secondary | ICD-10-CM

## 2013-10-23 DIAGNOSIS — R350 Frequency of micturition: Secondary | ICD-10-CM

## 2013-10-23 LAB — POCT URINALYSIS DIPSTICK
Bilirubin, UA: NEGATIVE
Ketones, UA: NEGATIVE
Leukocytes, UA: NEGATIVE
Protein, UA: NEGATIVE
Spec Grav, UA: 1.02

## 2013-10-23 MED ORDER — RISEDRONATE SODIUM 35 MG PO TABS: 35.0000 mg | ORAL_TABLET | ORAL | Status: DC

## 2013-10-23 MED ORDER — OXYBUTYNIN CHLORIDE ER 5 MG PO TB24: 5.0000 mg | ORAL_TABLET | Freq: Every day | ORAL | Status: DC

## 2013-10-23 NOTE — Progress Notes (Signed)
  Subjective:    Patient ID: Kerry Flynn, female    DOB: 03/26/1927, 79 y.o.   MRN: [id number]  HPI  The PT is here for follow up and re-evaluation of chronic medical conditions, medication management and review of any available recent lab and radiology data.  Preventive health is updated, specifically  Cancer screening and Immunization.  Unsure of last pneumonia vaccine but feels she had one, since over 5 years ago will offer when she is contacted about her labs due  in December Benefited a lot from PT earlier this year on her neck for chronic pain. The PT denies any adverse reactions to current medications since the last visit.  2 week h/o urinary frequency, denies pain or visible hematuria. C/o "shrinking" in height and back pain, willing to consider medication for osteoporosis      Review of Systems See HPI Denies recent fever or chills. Denies sinus pressure, nasal congestion, ear pain or sore throat. Denies chest congestion, productive cough or wheezing. Denies chest pains, palpitations and leg swelling Denies abdominal pain, nausea, vomiting,diarrhea or constipation.   . Chronic  joint pain,  and limitation in mobility. Denies headaches, seizures, numbness, or tingling. Denies depression, anxiety or insomnia. Denies skin break down or rash.        Objective:   Physical Exam Patient alert and oriented and in no cardiopulmonary distress.  HEENT: No facial asymmetry, EOMI, no sinus tenderness,  oropharynx pink and moist.  Neck decreased though adequate ROM, no adenopathy.  Chest: Clear to auscultation bilaterally.  CVS: S1, S2 no murmurs, no S3.  ABD: Soft non tender. Bowel sounds normal.  Ext: No edema  MS: Decreased  ROM spine, shoulders, hips and knees.  Skin: Intact, no ulcerations or rash noted.  Psych: Good eye contact, normal affect. Memory intact not anxious or depressed appearing.  CNS: CN 2-12 intact, power, tone and sensation normal  throughout.        Assessment & Plan:

## 2013-10-23 NOTE — Patient Instructions (Signed)
F/u in 5 month, call if you need me before  Flu vaccine today  First week in December, CBc, fasting lipid, chem 7 , TSH and Vit D  Urine shows no infection.  I will start you on medication to help with frequency and if no  Improvement in 4 weeks , pLEASE call for referral to urology   Xray of mid and lower back as soon as possible  Please start once weekly medication to build your bones, due to osteoperosis, call if this is a problem with your reflux, HOWEVER , if you take on empty stomach and sit upright for after taking with 8 ounces water this helps alot

## 2013-10-24 ENCOUNTER — Other Ambulatory Visit: Payer: Self-pay

## 2013-10-24 MED ORDER — AMLODIPINE BESYLATE 5 MG PO TABS: ORAL_TABLET | ORAL | Status: DC

## 2013-10-29 ENCOUNTER — Ambulatory Visit (HOSPITAL_COMMUNITY)
Admission: RE | Admit: 2013-10-29 | Discharge: 2013-10-29 | Disposition: A | Discharge status: Home / Self Care | Payer: Medicare Other | Source: Ambulatory Visit | Attending: Family Medicine | Admitting: Family Medicine

## 2013-10-29 ENCOUNTER — Other Ambulatory Visit: Payer: Self-pay | Admitting: Family Medicine

## 2013-10-29 DIAGNOSIS — M51379 Other intervertebral disc degeneration, lumbosacral region without mention of lumbar back pain or lower extremity pain: Secondary | ICD-10-CM | POA: Insufficient documentation

## 2013-10-29 DIAGNOSIS — M545 Low back pain, unspecified: Secondary | ICD-10-CM | POA: Insufficient documentation

## 2013-10-29 DIAGNOSIS — M810 Age-related osteoporosis without current pathological fracture: Secondary | ICD-10-CM | POA: Insufficient documentation

## 2013-10-29 DIAGNOSIS — M5137 Other intervertebral disc degeneration, lumbosacral region: Secondary | ICD-10-CM | POA: Insufficient documentation

## 2013-10-29 DIAGNOSIS — M4120 Other idiopathic scoliosis, site unspecified: Secondary | ICD-10-CM | POA: Insufficient documentation

## 2013-10-29 DIAGNOSIS — M546 Pain in thoracic spine: Secondary | ICD-10-CM

## 2013-10-29 DIAGNOSIS — R29890 Loss of height: Secondary | ICD-10-CM | POA: Insufficient documentation

## 2013-11-20 ENCOUNTER — Other Ambulatory Visit: Payer: Self-pay | Admitting: Family Medicine

## 2013-11-20 ENCOUNTER — Telehealth: Payer: Self-pay

## 2013-11-20 DIAGNOSIS — Q782 Osteopetrosis: Secondary | ICD-10-CM

## 2013-11-20 MED ORDER — AMLODIPINE BESYLATE 5 MG PO TABS: ORAL_TABLET | ORAL | Status: DC

## 2013-11-20 NOTE — Telephone Encounter (Signed)
pls advise pt I do appreciate the follow up call, sorry unable to tolerate actonel, glad that med for incontinence is helping.  I suggest she really consider seeing endocrinologist , if willing to try once per year IV medication for bone building. If she is let me know, I will refer to Dr Fransico Him. Pls also document s/e of actonel  She had In the interim , she is to ensure adequate intake of calcium, 1200mg  daily, and vit D3 800 to 100IU daily, and keep active as able for bone strength

## 2013-11-20 NOTE — Telephone Encounter (Signed)
Patient would like referral for Mid December if able. Couldn't tolerate the actonel because it made her reflux much worse and she has to take nsaids for her pain and she read it wasn't good to take actonel with nsaids.   Also, has a red flaky rash on her face, under her nose and around her mouth. Wants a cream called in for it.

## 2013-11-20 NOTE — Telephone Encounter (Signed)
pls refer top Dr Fransico Him to eval and treat osteoperosis, mid December appt preferred. Pls fax most recent labs which should have a vit D level , also her bone density test result, referral has been entered

## 2013-11-20 NOTE — Telephone Encounter (Signed)
Noted and referral sent 

## 2013-11-25 ENCOUNTER — Telehealth: Payer: Self-pay | Admitting: Family Medicine

## 2013-11-25 NOTE — Assessment & Plan Note (Signed)
Increased pain and loss of height xray to further eval

## 2013-11-25 NOTE — Telephone Encounter (Signed)
Pls call pt and remind her that she is to have fasting labs this week, first in December.  Also, since we are unable to determine when she actually got her pneumonia vaccine, since over 5 years ago for sure I recommend she get the vaccine, that should be her last unless the guidelines change, offer to give it this week when she comes for lab work  Hopefully she will be able to get appt info for Dr Fransico Him this week also , I will remind scheduler about this.  Thank you!

## 2013-11-25 NOTE — Assessment & Plan Note (Signed)
Pt ready to start trial of bisphosphonate due to increased kyphosis and loss of height , if unable to tolerate oral med will refer to endo for IV medication

## 2013-11-25 NOTE — Assessment & Plan Note (Signed)
Increased back pain, x ray too further eval

## 2013-11-25 NOTE — Assessment & Plan Note (Signed)
Continue PPI, also under GI surveillance by Dr Karilyn Cota due to [facility]

## 2013-11-25 NOTE — Assessment & Plan Note (Signed)
Controlled, no change in medication  

## 2013-11-25 NOTE — Assessment & Plan Note (Signed)
Trial of oxybutinin, UA in office today negative for infection

## 2013-11-28 NOTE — Telephone Encounter (Signed)
Patient aware and will stop by office for pneumonia.   Also given number for Dr. Fransico Him.

## 2013-12-03 ENCOUNTER — Other Ambulatory Visit: Payer: Self-pay | Admitting: Family Medicine

## 2013-12-03 ENCOUNTER — Encounter (INDEPENDENT_AMBULATORY_CARE_PROVIDER_SITE_OTHER): Payer: Self-pay

## 2013-12-03 ENCOUNTER — Ambulatory Visit: Payer: Medicare Other

## 2013-12-03 VITALS — BP 134/74 | Wt 161.1 lb

## 2013-12-03 DIAGNOSIS — Z23 Encounter for immunization: Secondary | ICD-10-CM

## 2013-12-03 LAB — COMPREHENSIVE METABOLIC PANEL
ALT: 9 U/L (ref 0–35)
AST: 18 U/L (ref 0–37)
Alkaline Phosphatase: 58 U/L (ref 39–117)
CO2: 26 mEq/L (ref 19–32)
Creat: 0.91 mg/dL (ref 0.50–1.10)
Sodium: 138 mEq/L (ref 135–145)
Total Bilirubin: 0.4 mg/dL (ref 0.3–1.2)
Total Protein: 6.7 g/dL (ref 6.0–8.3)

## 2013-12-03 LAB — CBC
MCH: 30.8 pg (ref 26.0–34.0)
MCHC: 34.9 g/dL (ref 30.0–36.0)
MCV: 88.3 fL (ref 78.0–100.0)
Platelets: 179 10*3/uL (ref 150–400)
RBC: 3.67 MIL/uL — ABNORMAL LOW (ref 3.87–5.11)
RDW: 12.9 % (ref 11.5–15.5)

## 2013-12-03 LAB — LIPID PANEL
Cholesterol: 152 mg/dL (ref 0–200)
HDL: 52 mg/dL (ref >39)
Total CHOL/HDL Ratio: 2.9 Ratio
VLDL: 16 mg/dL (ref 0–40)

## 2013-12-03 LAB — TSH: TSH: 3.686 u[IU]/mL (ref 0.350–4.500)

## 2013-12-03 NOTE — Progress Notes (Signed)
Patient in for routine vaccination.  Pneumonia vaccine given in left arm.  No sign or symptom of adverse reaction.  Information statement given.

## 2013-12-05 LAB — FERRITIN: Ferritin: 43 ng/mL (ref 10–291)

## 2013-12-16 ENCOUNTER — Other Ambulatory Visit: Payer: Self-pay | Admitting: Family Medicine

## 2014-01-08 ENCOUNTER — Ambulatory Visit: Payer: Self-pay | Admitting: Nurse Practitioner

## 2014-02-11 NOTE — Telephone Encounter (Signed)
Patient is aware 

## 2014-02-12 ENCOUNTER — Ambulatory Visit: Payer: Self-pay | Admitting: Nurse Practitioner

## 2014-03-08 ENCOUNTER — Telehealth: Payer: Self-pay | Admitting: Family Medicine

## 2014-03-08 NOTE — Telephone Encounter (Signed)
pls let her know since this was only for symptoms, not fixing the problem, no problem with stopping the med. If she wants urologist to evaluate her for incontinence pls offer and refer her to urologist of her choice, dx urinary incontinence.Pls also  Review non pharmaco management of incont inence with her briefly (I believe she is already aware)

## 2014-03-08 NOTE — Telephone Encounter (Signed)
Pls  see   response

## 2014-03-08 NOTE — Telephone Encounter (Signed)
Patient aware and will discuss further at office visit.

## 2014-03-08 NOTE — Telephone Encounter (Signed)
Noted . Please advise .

## 2014-03-12 ENCOUNTER — Ambulatory Visit (INDEPENDENT_AMBULATORY_CARE_PROVIDER_SITE_OTHER): Payer: Medicare Other | Admitting: Nurse Practitioner

## 2014-03-12 ENCOUNTER — Encounter: Payer: Self-pay | Admitting: *Deleted

## 2014-03-12 VITALS — BP 140/76 | HR 68 | Resp 16 | Wt 159.0 lb

## 2014-03-12 DIAGNOSIS — N952 Postmenopausal atrophic vaginitis: Secondary | ICD-10-CM | POA: Insufficient documentation

## 2014-03-12 DIAGNOSIS — Z780 Asymptomatic menopausal state: Secondary | ICD-10-CM | POA: Insufficient documentation

## 2014-03-12 DIAGNOSIS — E559 Vitamin D deficiency, unspecified: Secondary | ICD-10-CM | POA: Insufficient documentation

## 2014-03-12 DIAGNOSIS — M899 Disorder of bone, unspecified: Secondary | ICD-10-CM

## 2014-03-12 DIAGNOSIS — M949 Disorder of cartilage, unspecified: Secondary | ICD-10-CM

## 2014-03-12 DIAGNOSIS — M5136 Other intervertebral disc degeneration, lumbar region: Secondary | ICD-10-CM

## 2014-03-12 DIAGNOSIS — M5137 Other intervertebral disc degeneration, lumbosacral region: Secondary | ICD-10-CM

## 2014-03-12 DIAGNOSIS — M8580 Other specified disorders of bone density and structure, unspecified site: Secondary | ICD-10-CM

## 2014-03-12 DIAGNOSIS — Z01419 Encounter for gynecological examination (general) (routine) without abnormal findings: Secondary | ICD-10-CM

## 2014-03-12 MED ORDER — ESTRADIOL 0.1 MG/GM VA CREA: TOPICAL_CREAM | VAGINAL | Status: DC

## 2014-03-12 NOTE — Progress Notes (Signed)
Encounter reviewed by Dr. Brook Silva.  

## 2014-03-12 NOTE — Progress Notes (Signed)
Patient ID: Kerry Flynn, female   DOB: [birth year], 80 y.o.   MRN: [id number] 80 y.o. G0P0 Married Caucasian Fe here for annual exam. She has onging problems with leg sweling of legs. Thinks this is related to Detrol.  Some problems with ambulation and getting slower.  Able to walk 2 miles on treadmil.  Some low back pain and mid to lower back after exercise.   Last Vit D @ 41.  No LMP recorded. Patient is postmenopausal.          Sexually active: no  The current method of family planning is abstinence.    Exercising: yes  Exer-cycle at least 2 miles at least 2 times per week, T-Bands every other day Smoker:  no  Health Maintenance: Pap:  11/16/10, WNL MMG:  07/13/13, Bi-Rads 1: negative Colonoscopy:  04/19/12, repeat in 3 years (notes in EPIC) BMD:  02/05/12, osteoporosis TDaP:  11/04/07 Shingles: has not had Labs:  PCP in 11/2013 (in EPIC)   reports that she has quit smoking. She does not have any smokeless tobacco history on file. She reports that she drinks about 0.6 ounces of alcohol per week. She reports that she does not use illicit drugs.  Past Medical History  Diagnosis Date  . Hemorrhoids   . Intermittent vertigo   . Osteoporosis     knees  . Benign recurrent vertigo   . Vitamin D deficiency   . Head trauma     status post fall; upper and lower extremities   . GERD (gastroesophageal reflux disease)   . Diverticulosis   . Chronic constipation   . Osteoarthritis     of the neck  . Dermatitis     recurrent  . Hypertension     Past Surgical History  Procedure Laterality Date  . Knee arthroscopy  1999    rt  . Knee arthroscopy  1996    left   . Cholecystectomy, laparoscopic  2007    with stones  . Rotator cuff repair  May 14 2010  . Cataract extraction w/ intraocular lens implant      left, [facility]  . Eye surgery  2013    bilateral cataract extraction  . Colonoscopy  04/19/2012    Procedure: COLONOSCOPY;  Surgeon: Rogene Houston, MD;  Location: [facility];  Service: Endoscopy;  Laterality: N/A;  1200  . Tonsillectomy and adenoidectomy  1934  . Palate surgery  1988    Current Outpatient Prescriptions  Medication Sig Dispense Refill  . acetaminophen (TYLENOL) 500 MG tablet Take 1,000 mg by mouth every 6 (six) hours as needed. pain      . amLODipine (NORVASC) 5 MG tablet TAKE 1 TABLET BY MOUTH ONCE DAILY  30 tablet  3  . Calcium-Vitamin D-Vitamin K 500-100-40 MG-UNT-MCG CHEW Chew 1 tablet by mouth 2 (two) times daily.       Marland Kitchen docusate sodium (COLACE) 100 MG capsule Take 400 mg by mouth daily.      Marland Kitchen estradiol (ESTRACE) 0.1 MG/GM vaginal cream Place 1/2 gm intravaginaly once a week  42.5 g  3  . Magnesium Oxide (PHILLIPS) 500 MG (LAX) TABS Take 1 tablet by mouth daily.       . meclizine (ANTIVERT) 12.5 MG tablet Take 1 tablet (12.5 mg total) by mouth as needed. vertigo  30 tablet  0  . Misc Natural Products (COSAMIN ASU ADVANCED FORMULA) CAPS Take 1 capsule by mouth 3 (three) times daily with meals.       Marland Kitchen  Multiple Vitamin (MULTIVITAMIN) tablet Take 1 tablet by mouth daily.        . naproxen sodium (ANAPROX) 220 MG tablet Take 220 mg by mouth 2 (two) times daily with a meal.       . nitroGLYCERIN (NITROSTAT) 0.4 MG SL tablet Place 0.4 mg under the tongue every 5 (five) minutes as needed. 1 tablet under the tongue at onset of chest pain: you may repeat every 5 minutes for up to 3 doses      . omeprazole (PRILOSEC) 20 MG capsule Take 1 capsule (20 mg total) by mouth 2 (two) times daily before a meal.  60 capsule  5  . Probiotic Product (HEALTHY COLON) CAPS Take by mouth daily.       . ramipril (ALTACE) 10 MG capsule TAKE ONE CAPSULE BY MOUTH EVERY DAY  30 capsule  4  . Wheat Dextrin (BENEFIBER) POWD Take 2 scoop by mouth 2 (two) times daily.       Marland Kitchen oxybutynin (DITROPAN-XL) 5 MG 24 hr tablet Take 1 tablet (5 mg total) by mouth daily.  30 tablet  4   No current facility-administered medications for this visit.    Family History  Problem  Relation Age of Onset  . Parkinsonism Sister   . Diabetes Mother   . Hypertension Mother     cva  . Colon cancer Neg Hx   . Cancer Father     larengeal  . Heart failure Father     CHF  . Heart disease Father   . CVA Mother   . Kidney disease Mother   . Heart failure Mother   . Osteoporosis Sister     ROS:  Pertinent items are noted in HPI.  Otherwise, a comprehensive ROS was negative.  Exam:   BP 140/76  Pulse 68  Resp 16  Wt 159 lb (72.122 kg)    Ht Readings from Last 3 Encounters:  07/19/13 5' 6.5" (1.689 m)  05/03/13 5' 6.5" (1.689 m)  02/19/13 5' 6.5" (1.689 m)    General appearance: alert, cooperative and appears stated age ambulates with a wide gait and lordosis.  She uses a cane for ambulation. musch slower than last year with ambulation Head: Normocephalic, without obvious abnormality, atraumatic Neck: no adenopathy, supple, symmetrical, trachea midline and thyroid normal to inspection and palpation Lungs: clear to auscultation bilaterally Breasts: normal appearance, no masses or tenderness Heart: regular rate and rhythm Abdomen: soft, non-tender; no masses,  no organomegaly Extremities: extremities normal, atraumatic, no cyanosis or edema Skin: Skin color, texture, turgor normal. No rashes or lesions Lymph nodes: Cervical, supraclavicular, and axillary nodes normal. No abnormal inguinal nodes palpated Neurologic: Grossly normal   Pelvic: External genitalia:  no lesions              Urethra:  normal appearing urethra with no masses, tenderness or lesions. No redness as noted in the past              Bartholin's and Skene's: normal                 Vagina: very atrophic appearing vagina with pale color and discharge, no lesions              Cervix: anteverted              Pap taken: no Bimanual Exam:  Uterus:  normal size, contour, position, consistency, mobility, non-tender              Adnexa:  no mass, fullness, tenderness               Rectovaginal:  Confirms               Anus:  normal sphincter tone, no lesions  A:  Well Woman with normal exam  Postmenopausal with HRT 12/92- 06/2002  Atrophic vaginitis with mixed urinary incontinence  Vit d deficiency  Osteopenia of hip  P:   Pap smear as per guidelines   Mammogram due 06/2014  Refill on vaginal E cream she normally uses once weekly to the urethra  Counseled on breast self exam, mammography screening, adequate intake of calcium and vitamin D, diet and exercise, Kegel's exercises return annually or prn  An After Visit Summary was printed and given to the patient.

## 2014-03-12 NOTE — Patient Instructions (Signed)

## 2014-03-25 ENCOUNTER — Ambulatory Visit: Payer: Medicare Other | Admitting: Family Medicine

## 2014-04-03 ENCOUNTER — Encounter: Payer: Self-pay | Admitting: Family Medicine

## 2014-04-03 ENCOUNTER — Ambulatory Visit (INDEPENDENT_AMBULATORY_CARE_PROVIDER_SITE_OTHER): Payer: Medicare Other | Admitting: Family Medicine

## 2014-04-03 VITALS — BP 144/80 | HR 66 | Resp 16 | Wt 160.0 lb

## 2014-04-03 DIAGNOSIS — M25511 Pain in right shoulder: Secondary | ICD-10-CM

## 2014-04-03 DIAGNOSIS — M25561 Pain in right knee: Secondary | ICD-10-CM

## 2014-04-03 DIAGNOSIS — M25519 Pain in unspecified shoulder: Secondary | ICD-10-CM

## 2014-04-03 DIAGNOSIS — R079 Chest pain, unspecified: Secondary | ICD-10-CM

## 2014-04-03 DIAGNOSIS — Q782 Osteopetrosis: Secondary | ICD-10-CM

## 2014-04-03 DIAGNOSIS — N3941 Urge incontinence: Secondary | ICD-10-CM

## 2014-04-03 DIAGNOSIS — I10 Essential (primary) hypertension: Secondary | ICD-10-CM

## 2014-04-03 DIAGNOSIS — M1710 Unilateral primary osteoarthritis, unspecified knee: Secondary | ICD-10-CM

## 2014-04-03 DIAGNOSIS — Y92009 Unspecified place in unspecified non-institutional (private) residence as the place of occurrence of the external cause: Secondary | ICD-10-CM

## 2014-04-03 DIAGNOSIS — IMO0002 Reserved for concepts with insufficient information to code with codable children: Secondary | ICD-10-CM

## 2014-04-03 DIAGNOSIS — M545 Low back pain, unspecified: Secondary | ICD-10-CM

## 2014-04-03 DIAGNOSIS — E042 Nontoxic multinodular goiter: Secondary | ICD-10-CM

## 2014-04-03 DIAGNOSIS — Z7189 Other specified counseling: Secondary | ICD-10-CM

## 2014-04-03 DIAGNOSIS — K219 Gastro-esophageal reflux disease without esophagitis: Secondary | ICD-10-CM

## 2014-04-03 DIAGNOSIS — W19XXXA Unspecified fall, initial encounter: Secondary | ICD-10-CM

## 2014-04-03 DIAGNOSIS — M25569 Pain in unspecified knee: Secondary | ICD-10-CM

## 2014-04-03 MED ORDER — RAMIPRIL 10 MG PO CAPS: ORAL_CAPSULE | ORAL | Status: DC

## 2014-04-03 NOTE — Progress Notes (Signed)
Subjective:    Patient ID: Kerry Flynn, female    DOB: [birth year], 80 y.o.   MRN: [id number]  HPI [place] 2 sundays back down 3 steps onto a carpet and hit the right knee, right shoulder, lower rib cage and  Still is bruised just above the knee, also  Also right kidney and lower chest is sore. Improvement in the urine incontinence and felt as though the detrol was cxausing leg swelling which was more bothersome than the incontinence so she stopped this. Had her recent annul exam with gyne, and is maintained on topical estrogen cream sparingly Notes increased stooping, and generalized pain and reduced mobility with aging, still however involved with meals on wheels and even along with her husband takes out a friend who is oover 100years every 2 weeks Concerned about her husband's failing health,he has prostate cancer and there has been recent progression of the disease apparently, she is essentially putting her health needs on hold until his situation is clarified  Review of Systems See HPI Denies recent fever or chills. Denies sinus pressure, nasal congestion, ear pain or sore throat. Denies chest congestion, productive cough or wheezing. Denies chest pains, palpitations and leg swelling Denies abdominal pain, nausea, vomiting,diarrhea oDenies joint pain, swelling and limitation in mobility. Denies headaches, seizures, numbness, or tingling. Denies depression, or insomnia.She does have some anxiety due to deterioration in health, she has no children C/o bruising on right lower posterior thigh just above knee with recent fall      Objective:   Physical Exam BP 144/80  Pulse 66  Resp 16  Wt 160 lb (72.576 kg)  SpO2 100% Patient alert and oriented and in no cardiopulmonary distress.Exytremely slow speech and hearing impairment  HEENT: No facial asymmetry, EOMI, no sinus tenderness,  oropharynx pink and moist.  Neck decreased ROM, no JVD, no adenopathy.  Chest: Clear to auscultation  bilaterally.  CVS: S1, S2 no murmurs, no S3.  ABD: Soft mildly tender over right lumbar area Ext: No edema  MS: decreased  ROM spine, shoulders, hips and knees.  Skin: Intact, bruise on posterior right thigh  noted.  Psych: Good eye contact, normal affect. Memory slightly impaired, notes increasing forgetfulness, anxious not  depressed appearing.  CNS: CN 2-12 intact, power, tnormal throughout.        Assessment & Plan:  HYPERTENSION Fair control though not at goal , no change in med dose  Fall at home rewcent fall in her n home with residual right knee , right side of chest, and shoulder, bruise present on lower right thigh posterior aspect just above lknee, diameter max approx 3in, tender over right renal angle, will get imaging studies  Encounter for home safety review for injury prevention reviewd home safety with patient and provided information for review  Osteopetrosis Is aware of  Deterioration in her posture due to increased kyphosis, may be willing to reconsider IV bisphosphonate  DEGENERATIVE JOINT DISEASE, KNEES, BILATERAL Increased instability, was going to have surgical intervention last year , but deterioration in her husband's health halted this, encouraged her strongly to have orthopedic eval asap esp in light of recent fall  Urinary incontinence, urge Improved, no longer taking medication  Multiple thyroid nodules Needs rept [place] to follow up, last imaged in 2010, will contact her about this after she has her xrays, somewhat overwhelmed wit spouse's health  Knee pain, right Deteriorated with recent fall, needs ortho eval  GERD (gastroesophageal reflux disease) Controlled, no change in medication

## 2014-04-03 NOTE — Patient Instructions (Addendum)
F/u in 4,5 month, cqll if you need me before  No changes in your medications  Please keep a very open mind  To getting help that you might need  You will be referred to physical therapy  And please be careful   Please keep appt for your orthopedic Doc when made, you need this.  UI recommend xray of right shoulder, chest, low back and right knee today  You will benefit from physical therapy and need this  Fall Prevention and Home Safety Falls cause injuries and can affect all age groups. It is possible to prevent falls.  HOW TO PREVENT FALLS  Wear shoes with rubber soles that do not have an opening for your toes.  Keep the inside and outside of your house well lit.  Use night lights throughout your home.  Remove clutter from floors.  Clean up floor spills.  Remove throw rugs or fasten them to the floor with carpet tape.  Do not place electrical cords across pathways.  Put grab bars by your tub, shower, and toilet. Do not use towel bars as grab bars.  Put handrails on both sides of the stairway. Fix loose handrails.  Do not climb on stools or stepladders, if possible.  Do not wax your floors.  Repair uneven or unsafe sidewalks, walkways, or stairs.  Keep items you use a lot within reach.  Be aware of pets.  Keep emergency numbers next to the telephone.  Put smoke detectors in your home and near bedrooms. Ask your doctor what other things you can do to prevent falls. Document Released: 10/09/2009 Document Revised: 06/13/2012 Document Reviewed: 03/14/2012 [facility] Patient Information 2014 [place], [place].

## 2014-04-04 ENCOUNTER — Ambulatory Visit (HOSPITAL_COMMUNITY)
Admission: RE | Admit: 2014-04-04 | Discharge: 2014-04-04 | Disposition: A | Discharge status: Home / Self Care | Payer: Medicare Other | Source: Ambulatory Visit | Attending: Family Medicine | Admitting: Family Medicine

## 2014-04-04 DIAGNOSIS — M25561 Pain in right knee: Secondary | ICD-10-CM

## 2014-04-04 DIAGNOSIS — M5137 Other intervertebral disc degeneration, lumbosacral region: Secondary | ICD-10-CM | POA: Insufficient documentation

## 2014-04-04 DIAGNOSIS — K219 Gastro-esophageal reflux disease without esophagitis: Secondary | ICD-10-CM | POA: Insufficient documentation

## 2014-04-04 DIAGNOSIS — M545 Low back pain, unspecified: Secondary | ICD-10-CM

## 2014-04-04 DIAGNOSIS — M25511 Pain in right shoulder: Secondary | ICD-10-CM

## 2014-04-04 DIAGNOSIS — M51379 Other intervertebral disc degeneration, lumbosacral region without mention of lumbar back pain or lower extremity pain: Secondary | ICD-10-CM | POA: Insufficient documentation

## 2014-04-04 DIAGNOSIS — E042 Nontoxic multinodular goiter: Secondary | ICD-10-CM | POA: Insufficient documentation

## 2014-04-04 DIAGNOSIS — W19XXXA Unspecified fall, initial encounter: Secondary | ICD-10-CM | POA: Insufficient documentation

## 2014-04-04 DIAGNOSIS — R079 Chest pain, unspecified: Secondary | ICD-10-CM | POA: Insufficient documentation

## 2014-04-04 DIAGNOSIS — Z0000 Encounter for general adult medical examination without abnormal findings: Secondary | ICD-10-CM | POA: Insufficient documentation

## 2014-04-04 DIAGNOSIS — M25569 Pain in unspecified knee: Secondary | ICD-10-CM | POA: Insufficient documentation

## 2014-04-04 DIAGNOSIS — Y92009 Unspecified place in unspecified non-institutional (private) residence as the place of occurrence of the external cause: Principal | ICD-10-CM

## 2014-04-04 DIAGNOSIS — T07XXXA Unspecified multiple injuries, initial encounter: Secondary | ICD-10-CM | POA: Insufficient documentation

## 2014-04-04 DIAGNOSIS — M25469 Effusion, unspecified knee: Secondary | ICD-10-CM | POA: Insufficient documentation

## 2014-04-04 DIAGNOSIS — M259 Joint disorder, unspecified: Secondary | ICD-10-CM | POA: Insufficient documentation

## 2014-04-04 NOTE — Assessment & Plan Note (Signed)
Increased instability, was going to have surgical intervention last year , but deterioration in her husband's health halted this, encouraged her strongly to have orthopedic eval asap esp in light of recent fall

## 2014-04-04 NOTE — Assessment & Plan Note (Signed)
reviewd home safety with patient and provided information for review

## 2014-04-04 NOTE — Assessment & Plan Note (Signed)
rewcent fall in her n home with residual right knee , right side of chest, and shoulder, bruise present on lower right thigh posterior aspect just above lknee, diameter max approx 3in, tender over right renal angle, will get imaging studies

## 2014-04-04 NOTE — Assessment & Plan Note (Signed)
Controlled, no change in medication  

## 2014-04-04 NOTE — Assessment & Plan Note (Signed)
Improved, no longer taking medication

## 2014-04-04 NOTE — Assessment & Plan Note (Signed)
Needs rept [place] to follow up, last imaged in 2010, will contact her about this after she has her xrays, somewhat overwhelmed wit spouse's health

## 2014-04-04 NOTE — Assessment & Plan Note (Signed)
Deteriorated with recent fall, needs ortho eval

## 2014-04-04 NOTE — Assessment & Plan Note (Signed)
Is aware of  Deterioration in her posture due to increased kyphosis, may be willing to reconsider IV bisphosphonate

## 2014-04-04 NOTE — Assessment & Plan Note (Signed)
Fair control though not at goal , no change in med dose

## 2014-04-08 ENCOUNTER — Other Ambulatory Visit: Payer: Self-pay

## 2014-04-08 DIAGNOSIS — E042 Nontoxic multinodular goiter: Secondary | ICD-10-CM

## 2014-04-26 ENCOUNTER — Ambulatory Visit (HOSPITAL_COMMUNITY): Payer: Medicare Other

## 2014-05-13 ENCOUNTER — Ambulatory Visit (HOSPITAL_COMMUNITY)
Admission: RE | Admit: 2014-05-13 | Discharge: 2014-05-13 | Disposition: A | Discharge status: Home / Self Care | Payer: Medicare Other | Source: Ambulatory Visit | Attending: Family Medicine | Admitting: Family Medicine

## 2014-05-13 DIAGNOSIS — E042 Nontoxic multinodular goiter: Secondary | ICD-10-CM

## 2014-05-13 DIAGNOSIS — E041 Nontoxic single thyroid nodule: Secondary | ICD-10-CM | POA: Insufficient documentation

## 2014-07-26 ENCOUNTER — Other Ambulatory Visit: Payer: Self-pay | Admitting: Family Medicine

## 2014-07-26 ENCOUNTER — Telehealth: Payer: Self-pay | Admitting: *Deleted

## 2014-07-26 NOTE — Telephone Encounter (Signed)
Pt called stating she needs her RX refilled and she is out of it. She uses [facility] in [place].

## 2014-07-26 NOTE — Telephone Encounter (Signed)
Amlodipine refilled per electronic refill request

## 2014-08-26 ENCOUNTER — Telehealth: Payer: Self-pay | Admitting: Family Medicine

## 2014-08-26 NOTE — Telephone Encounter (Signed)
States she has the same thing a few years ago. Itchy rash in her scalp that spread down to her shoulders x July. Has been using clobetasol propionate 0.05% solution from the dermatologist that isn't doing that much good. Wants to know if you will prescribe her something or does she have to be seen? Please advise

## 2014-08-27 ENCOUNTER — Other Ambulatory Visit: Payer: Self-pay

## 2014-08-27 MED ORDER — PREDNISONE 5 MG PO TABS: ORAL_TABLET | ORAL | Status: DC

## 2014-08-27 NOTE — Telephone Encounter (Signed)
Last year Feb received shortcourse of prednisone by mouth, for similar prob. Pls send script I am entering and let her know, thanks  Also make her aware of flu clinic when she improves an is off pred/ early Oct

## 2014-08-27 NOTE — Telephone Encounter (Signed)
Husband aware and med sent

## 2014-08-30 NOTE — Telephone Encounter (Signed)
Noted, hopefully this will help her

## 2014-09-26 ENCOUNTER — Encounter: Payer: Medicare Other | Admitting: Family Medicine

## 2014-09-26 HISTORY — PX: OTHER SURGICAL HISTORY: SHX169

## 2014-10-02 ENCOUNTER — Encounter: Payer: Medicare Other | Admitting: Family Medicine

## 2014-10-19 ENCOUNTER — Other Ambulatory Visit: Payer: Self-pay | Admitting: Family Medicine

## 2014-10-29 ENCOUNTER — Other Ambulatory Visit (HOSPITAL_COMMUNITY): Payer: Self-pay | Admitting: "Endocrinology

## 2014-10-29 DIAGNOSIS — M810 Age-related osteoporosis without current pathological fracture: Secondary | ICD-10-CM

## 2014-11-15 ENCOUNTER — Other Ambulatory Visit: Payer: Self-pay | Admitting: Family Medicine

## 2014-12-02 ENCOUNTER — Emergency Department (HOSPITAL_COMMUNITY)
Admission: EM | Admit: 2014-12-02 | Discharge: 2014-12-02 | Disposition: A | Discharge status: Home / Self Care | Payer: Medicare Other | Attending: Emergency Medicine | Admitting: Emergency Medicine

## 2014-12-02 ENCOUNTER — Emergency Department (HOSPITAL_COMMUNITY): Payer: Medicare Other

## 2014-12-02 ENCOUNTER — Encounter (HOSPITAL_COMMUNITY): Payer: Self-pay | Admitting: Emergency Medicine

## 2014-12-02 DIAGNOSIS — Z880 Allergy status to penicillin: Secondary | ICD-10-CM | POA: Diagnosis not present

## 2014-12-02 DIAGNOSIS — Z791 Long term (current) use of non-steroidal anti-inflammatories (NSAID): Secondary | ICD-10-CM | POA: Diagnosis not present

## 2014-12-02 DIAGNOSIS — Z79899 Other long term (current) drug therapy: Secondary | ICD-10-CM | POA: Insufficient documentation

## 2014-12-02 DIAGNOSIS — I10 Essential (primary) hypertension: Secondary | ICD-10-CM | POA: Insufficient documentation

## 2014-12-02 DIAGNOSIS — Z8742 Personal history of other diseases of the female genital tract: Secondary | ICD-10-CM | POA: Diagnosis not present

## 2014-12-02 DIAGNOSIS — M810 Age-related osteoporosis without current pathological fracture: Secondary | ICD-10-CM | POA: Diagnosis not present

## 2014-12-02 DIAGNOSIS — Z87891 Personal history of nicotine dependence: Secondary | ICD-10-CM | POA: Diagnosis not present

## 2014-12-02 DIAGNOSIS — K5900 Constipation, unspecified: Secondary | ICD-10-CM | POA: Diagnosis not present

## 2014-12-02 DIAGNOSIS — K219 Gastro-esophageal reflux disease without esophagitis: Secondary | ICD-10-CM | POA: Diagnosis not present

## 2014-12-02 DIAGNOSIS — Z872 Personal history of diseases of the skin and subcutaneous tissue: Secondary | ICD-10-CM | POA: Insufficient documentation

## 2014-12-02 DIAGNOSIS — E559 Vitamin D deficiency, unspecified: Secondary | ICD-10-CM | POA: Diagnosis not present

## 2014-12-02 DIAGNOSIS — M79652 Pain in left thigh: Secondary | ICD-10-CM | POA: Diagnosis not present

## 2014-12-02 DIAGNOSIS — W19XXXA Unspecified fall, initial encounter: Secondary | ICD-10-CM

## 2014-12-02 LAB — BASIC METABOLIC PANEL
Anion gap: 13 (ref 5–15)
BUN: 30 mg/dL — ABNORMAL HIGH (ref 6–23)
CALCIUM: 9.4 mg/dL (ref 8.4–10.5)
CO2: 24 mEq/L (ref 19–32)
Chloride: 101 mEq/L (ref 96–112)
Creatinine, Ser: 0.86 mg/dL (ref 0.50–1.10)
GFR calc Af Amer: 69 mL/min — ABNORMAL LOW (ref >90)
GFR, EST NON AFRICAN AMERICAN: 60 mL/min — AB (ref >90)
Glucose, Bld: 110 mg/dL — ABNORMAL HIGH (ref 70–99)
Potassium: 4.3 mEq/L (ref 3.7–5.3)
Sodium: 138 mEq/L (ref 137–147)

## 2014-12-02 MED ORDER — DIAZEPAM 5 MG PO TABS: 5.0000 mg | ORAL_TABLET | Freq: Three times a day (TID) | ORAL | Status: DC | PRN

## 2014-12-02 MED ORDER — DIAZEPAM 5 MG/ML IJ SOLN
5.0000 mg | Freq: Once | INTRAMUSCULAR | Status: AC
  Administered 2014-12-02: 5 mg via INTRAVENOUS
  Filled 2014-12-02: qty 2

## 2014-12-02 MED ORDER — HYDROCODONE-ACETAMINOPHEN 5-325 MG PO TABS: 0.5000 | ORAL_TABLET | ORAL | Status: DC | PRN

## 2014-12-02 NOTE — ED Provider Notes (Signed)
CSN: [id number]     Arrival date & time 12/02/14  1157 History   First MD Initiated Contact with Patient 12/02/14 0413     Chief Complaint  Patient presents with  . Hip Pain     (Consider location/radiation/quality/duration/timing/severity/associated sxs/prior Treatment) HPI  This is an 80 year old female who fell sometime before Thanksgiving but did not seek medical attention. About a week later she developed pain in her left sacroiliac joint, radiating around to the left groin. The pain acutely worsened this morning and she describes it as "excruciating". The pain is most severe in the abductor muscles of her left thigh. Pain is worse with flexion and abduction at the left hip.   Past Medical History  Diagnosis Date  . Hemorrhoids   . Intermittent vertigo   . Osteoporosis     knees  . Benign recurrent vertigo   . Vitamin D deficiency   . Head trauma     status post fall; upper and lower extremities   . GERD (gastroesophageal reflux disease)   . Diverticulosis   . Chronic constipation   . Osteoarthritis     of the neck  . Dermatitis     recurrent  . Hypertension   . Barrett's esophagus   . Infertility, female    Past Surgical History  Procedure Laterality Date  . Knee arthroscopy  1999    rt  . Knee arthroscopy  1996    left   . Cholecystectomy, laparoscopic  2007    with stones  . Rotator cuff repair  May 14 2010  . Cataract extraction w/ intraocular lens implant      left, [facility]  . Eye surgery  2013    bilateral cataract extraction  . Colonoscopy  04/19/2012    Procedure: COLONOSCOPY;  Surgeon: Rogene Houston, MD;  Location: [facility];  Service: Endoscopy;  Laterality: N/A;  1200  . Tonsillectomy and adenoidectomy  1934  . Palate surgery  1988   Family History  Problem Relation Age of Onset  . Parkinsonism Sister   . Diabetes Mother   . Hypertension Mother     cva  . Colon cancer Neg Hx   . Cancer Father     larengeal  . Heart failure Father      CHF  . Heart disease Father   . CVA Mother   . Kidney disease Mother   . Heart failure Mother   . Osteoporosis Sister    History  Substance Use Topics  . Smoking status: Former Smoker -- 0.25 packs/day for .5 years  . Smokeless tobacco: Not on file  . Alcohol Use: 0.6 oz/week    1 Glasses of wine per week     Comment: occassionally wine with a meal   OB History    Gravida Para Term Preterm AB TAB SAB Ectopic Multiple Living   0 0             Review of Systems  All other systems reviewed and are negative.   Allergies  Clarithromycin; Flagyl; and Penicillins  Home Medications   Prior to Admission medications   Medication Sig Start Date End Date Taking? Authorizing Provider  acetaminophen (TYLENOL) 500 MG tablet Take 1,000 mg by mouth every 6 (six) hours as needed. pain   Yes Historical Provider, MD  amLODipine (NORVASC) 5 MG tablet TAKE 1 TABLET BY MOUTH ONCE DAILY 11/15/14  Yes Fayrene Helper, MD  Calcium-Vitamin D-Vitamin K 500-100-40 MG-UNT-MCG CHEW Chew 1 tablet by  mouth 2 (two) times daily.    Yes Historical Provider, MD  docusate sodium (COLACE) 100 MG capsule Take 400 mg by mouth daily.   Yes Historical Provider, MD  estradiol (ESTRACE) 0.1 MG/GM vaginal cream Place 1/2 gm intravaginaly once a week 03/12/14  Yes Patricia Rolen-Grubb, FNP  Magnesium Oxide (PHILLIPS) 500 MG (LAX) TABS Take 1 tablet by mouth daily.    Yes Historical Provider, MD  meclizine (ANTIVERT) 12.5 MG tablet Take 1 tablet (12.5 mg total) by mouth as needed. vertigo 07/26/12  Yes Fayrene Helper, MD  Misc Natural Products (COSAMIN ASU ADVANCED FORMULA) CAPS Take 1 capsule by mouth 3 (three) times daily with meals.    Yes Historical Provider, MD  Multiple Vitamin (MULTIVITAMIN) tablet Take 1 tablet by mouth daily.     Yes Historical Provider, MD  naproxen sodium (ANAPROX) 220 MG tablet Take 220 mg by mouth 2 (two) times daily with a meal.    Yes Historical Provider, MD  omeprazole (PRILOSEC)  20 MG capsule Take 1 capsule (20 mg total) by mouth 2 (two) times daily before a meal. 04/25/12 12/02/14 Yes Butch Penny, NP  Probiotic Product (HEALTHY COLON) CAPS Take by mouth daily.    Yes Historical Provider, MD  ramipril (ALTACE) 10 MG capsule TAKE ONE CAPSULE BY MOUTH EVERY DAY 10/19/14  Yes Fayrene Helper, MD  Wheat Dextrin [facility]) POWD Take 2 scoop by mouth 2 (two) times daily.    Yes Historical Provider, MD  nitroGLYCERIN (NITROSTAT) 0.4 MG SL tablet Place 0.4 mg under the tongue every 5 (five) minutes as needed. 1 tablet under the tongue at onset of chest pain: you may repeat every 5 minutes for up to 3 doses    Historical Provider, MD  predniSONE (DELTASONE) 5 MG tablet One tablet three times daly for two days. Then one tablet twice daily for two days. Then one tablet once daily for two days. Then stop. 08/27/14   Fayrene Helper, MD   BP 144/53 mmHg  Pulse 66  Resp 15  Ht 5\' 6"  (1.676 m)  Wt 155 lb (70.308 kg)  BMI 25.03 kg/m2  SpO2 96%   Physical Exam  General: Well-developed, well-nourished female in no acute distress; appearance consistent with age of record HENT: normocephalic; atraumatic Eyes: pupils equal, round and reactive to light; extraocular muscles intact Neck: supple Heart: regular rate and rhythm Lungs: clear to auscultation bilaterally Abdomen: soft; nondistended; nontender; bowel sounds present Extremities: Significant arthritic changes; +1 edema of the lower legs; pain in medial and anterior proximal thigh on passive range of motion of the left hip Neurologic: Awake, alert and oriented; motor function intact in all extremities and symmetric; no facial droop Skin: Warm and dry Psychiatric: Normal mood and affect    ED Course  Procedures (including critical care time)   MDM  Nursing notes and vitals signs, including pulse oximetry, reviewed.  Summary of this visit's results, reviewed by myself:  Labs:  Results for orders placed or  performed during the hospital encounter of 12/02/14 (from the past 24 hour(s))  Basic metabolic panel     Status: Abnormal   Collection Time: 12/02/14  4:42 AM  Result Value Ref Range   Sodium 138 137 - 147 mEq/L   Potassium 4.3 3.7 - 5.3 mEq/L   Chloride 101 96 - 112 mEq/L   CO2 24 19 - 32 mEq/L   Glucose, Bld 110 (H) 70 - 99 mg/dL   BUN 30 (H) 6 - 23  mg/dL   Creatinine, Ser 0.86 0.50 - 1.10 mg/dL   Calcium 9.4 8.4 - 10.5 mg/dL   GFR calc non Af Amer 60 (L) >90 mL/min   GFR calc Af Amer 69 (L) >90 mL/min   Anion gap 13 5 - 15    Imaging Studies: Dg Hip Complete Left  12/02/2014   CLINICAL DATA:  Left hip pain after a fall on Thanksgiving. Pain started 1 week after a fall.  EXAM: LEFT HIP - COMPLETE 2+ VIEW  COMPARISON:  None.  FINDINGS: There is no evidence of hip fracture or dislocation. There is no evidence of arthropathy or other focal bone abnormality.  IMPRESSION: Negative.   Electronically Signed   By: Lucienne Capers M.D.   On: 12/02/2014 04:24   5:43 AM Significant symptomatic relief with IV Valium. Patient still has pain on range of motion.     Wynetta Fines, MD 12/02/14 [phone]

## 2014-12-02 NOTE — ED Notes (Signed)
Dr. Molpus at bedside. 

## 2014-12-02 NOTE — ED Notes (Addendum)
Pt. C/o left hip pain. Pt. Reports fall prior to Thanksgiving but was not evaluated. Pt. Reports left hip pain started 1 week after fall. No obvious deformity.

## 2014-12-04 ENCOUNTER — Telehealth: Payer: Self-pay | Admitting: Family Medicine

## 2014-12-04 NOTE — Telephone Encounter (Signed)
Called patient and left message for them to return call at the office   

## 2014-12-04 NOTE — Telephone Encounter (Signed)
Pls contact pt and let her know that I am aware she was recently in ED due to hip pain Ask if she needs treferral to her ortho about this or is she doing "well" Pls enter referral to appropriate Doc if she needs this, I will sign Also her visit here is in Jan, pls offer flu vaccine

## 2014-12-05 ENCOUNTER — Other Ambulatory Visit (HOSPITAL_COMMUNITY): Payer: Medicare Other

## 2014-12-05 NOTE — Telephone Encounter (Signed)
No referral needed. Will come when feeling better for flu shot

## 2014-12-13 ENCOUNTER — Ambulatory Visit (HOSPITAL_COMMUNITY)
Admission: RE | Admit: 2014-12-13 | Discharge: 2014-12-13 | Disposition: A | Discharge status: Home / Self Care | Payer: Medicare Other | Source: Ambulatory Visit | Attending: "Endocrinology | Admitting: "Endocrinology

## 2014-12-13 DIAGNOSIS — M8580 Other specified disorders of bone density and structure, unspecified site: Secondary | ICD-10-CM | POA: Diagnosis not present

## 2014-12-13 DIAGNOSIS — Z13820 Encounter for screening for osteoporosis: Secondary | ICD-10-CM | POA: Diagnosis present

## 2014-12-13 DIAGNOSIS — R29890 Loss of height: Secondary | ICD-10-CM | POA: Insufficient documentation

## 2014-12-13 DIAGNOSIS — Z780 Asymptomatic menopausal state: Secondary | ICD-10-CM | POA: Insufficient documentation

## 2014-12-13 DIAGNOSIS — M810 Age-related osteoporosis without current pathological fracture: Secondary | ICD-10-CM

## 2014-12-31 ENCOUNTER — Encounter: Payer: Self-pay | Admitting: Family Medicine

## 2014-12-31 ENCOUNTER — Ambulatory Visit (INDEPENDENT_AMBULATORY_CARE_PROVIDER_SITE_OTHER): Payer: Medicare Other

## 2014-12-31 ENCOUNTER — Ambulatory Visit (INDEPENDENT_AMBULATORY_CARE_PROVIDER_SITE_OTHER): Payer: Medicare Other | Admitting: Family Medicine

## 2014-12-31 ENCOUNTER — Other Ambulatory Visit: Payer: Self-pay | Admitting: Family Medicine

## 2014-12-31 VITALS — BP 140/72 | HR 90 | Resp 16 | Ht 66.0 in | Wt 162.0 lb

## 2014-12-31 DIAGNOSIS — R7989 Other specified abnormal findings of blood chemistry: Secondary | ICD-10-CM

## 2014-12-31 DIAGNOSIS — Z23 Encounter for immunization: Secondary | ICD-10-CM

## 2014-12-31 DIAGNOSIS — Z0000 Encounter for general adult medical examination without abnormal findings: Secondary | ICD-10-CM | POA: Insufficient documentation

## 2014-12-31 DIAGNOSIS — I10 Essential (primary) hypertension: Secondary | ICD-10-CM

## 2014-12-31 DIAGNOSIS — I25118 Atherosclerotic heart disease of native coronary artery with other forms of angina pectoris: Secondary | ICD-10-CM

## 2014-12-31 DIAGNOSIS — E559 Vitamin D deficiency, unspecified: Secondary | ICD-10-CM

## 2014-12-31 DIAGNOSIS — M7989 Other specified soft tissue disorders: Secondary | ICD-10-CM

## 2014-12-31 LAB — COMPLETE METABOLIC PANEL WITH GFR
ALT: 11 U/L (ref 0–35)
AST: 22 U/L (ref 0–37)
Albumin: 4.1 g/dL (ref 3.5–5.2)
Alkaline Phosphatase: 54 U/L (ref 39–117)
BUN: 22 mg/dL (ref 6–23)
CO2: 25 mEq/L (ref 19–32)
Calcium: 9.5 mg/dL (ref 8.4–10.5)
Chloride: 101 mEq/L (ref 96–112)
Creat: 0.83 mg/dL (ref 0.50–1.10)
GFR, EST AFRICAN AMERICAN: 74 mL/min
GFR, Est Non African American: 65 mL/min
GLUCOSE: 94 mg/dL (ref 70–99)
Potassium: 4.7 mEq/L (ref 3.5–5.3)
SODIUM: 136 meq/L (ref 135–145)
TOTAL PROTEIN: 6.9 g/dL (ref 6.0–8.3)
Total Bilirubin: 0.4 mg/dL (ref 0.2–1.2)

## 2014-12-31 LAB — CBC
HEMATOCRIT: 30 % — AB (ref 36.0–46.0)
Hemoglobin: 10.4 g/dL — ABNORMAL LOW (ref 12.0–15.0)
MCH: 30.8 pg (ref 26.0–34.0)
MCHC: 34.7 g/dL (ref 30.0–36.0)
MCV: 88.8 fL (ref 78.0–100.0)
MPV: 10.8 fL (ref 8.6–12.4)
Platelets: 188 10*3/uL (ref 150–400)
RBC: 3.38 MIL/uL — ABNORMAL LOW (ref 3.87–5.11)
RDW: 12.7 % (ref 11.5–15.5)
WBC: 6.5 10*3/uL (ref 4.0–10.5)

## 2014-12-31 LAB — LIPID PANEL
Cholesterol: 149 mg/dL (ref 0–200)
HDL: 64 mg/dL (ref >39)
LDL Cholesterol: 72 mg/dL (ref 0–99)
Total CHOL/HDL Ratio: 2.3 Ratio
Triglycerides: 67 mg/dL (ref <150)
VLDL: 13 mg/dL (ref 0–40)

## 2014-12-31 LAB — TSH: TSH: 2.716 u[IU]/mL (ref 0.350–4.500)

## 2014-12-31 NOTE — Patient Instructions (Addendum)
F/u in 3 month, call if you need me before  You are referred to local cardiologist , I will send him a message about you also  Labs today TSH, cmp and eGFr, CBC, vit D and lipid I am contacting a case worker as we discussed to make a home visit with you  Flu vaccine today  I am concerned about the fact that your mobility has so greatly deteriorated and that you have leg swelling and increased fatigue. I am working on these problems

## 2014-12-31 NOTE — Progress Notes (Signed)
Subjective:    Patient ID: Kerry Flynn, female    DOB: 1927-11-05, 81 y.o.   MRN: [id number]  HPI Preventive Screening-Counseling & Management   Patient present here today for a Medicare annual wellness visit. 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 No  Over the past two weeks, have you felt little interest or pleasure in doing things? No  Have you lost interest or pleasure in daily life? No  Do you often feel hopeless? No  Do you cry easily over simple problems? No   Activities of Daily Living  In your present state of health, do you have any difficulty performing the following activities?  Driving?: sometimes, but her leg swelling limits her range of motion and its hard for her to brake  Managing money?: No Feeding yourself?:No Getting from bed to chair?: sometimes needs assistance  Climbing a flight of stairs?: her left knee hurts her so she would need a cane and handrails , has used a walker since past 2  weeks Preparing food and eating?:No Bathing or showering?: has a chair in the shower when needed  Getting dressed?:yes Getting to the toilet?:yes Using the toilet?:No Moving around from place to place?: yes, now using a walker x 2 weeks  Fall Risk Assessment In the past year have you fallen or had a near fall?:2 times she has fallen in the past year( once she was exercising and lost her balance)  Are you currently taking any medications that make you dizzy?:No   Hearing Difficulties: No Do you often ask people to speak up or repeat themselves?:No Do you experience ringing or noises in your ears?:No Do you have difficulty understanding soft or whispered voices?:No  Cognitive Testing  Alert? Yes Normal Appearance?Yes  Oriented to person? Yes Place? Yes  Time? Yes  Displays appropriate judgment?Yes  Can read the correct time from a watch face? yes Are you having problems remembering things?No  Advanced Directives have been discussed with the patient?Yes, has a notarized living will    List the Names of Other Physician/Practitioners you currently use:  Nida (endo)   Indicate any recent Medical Services you may have received from other than [facility] providers in the past year (date may be approximate).   Assessment:    Annual Wellness Exam   Plan:      Medicare Attestation  I have personally reviewed:  The patient's medical and social history  Their use of alcohol, tobacco or illicit drugs  Their current medications and supplements  The patient's functional  ability including ADLs,fall risks, home safety risks, cognitive, and hearing and visual impairment  Diet and physical activities  Evidence for depression or mood disorders  The patient's weight, height, BMI, and visual acuity have been recorded in the chart. I have made referrals, counseling, and provided education to the patient based on review of the above and I have provided the patient with a written personalized care  plan for preventive services.      Review of Systems     Objective:   Physical Exam  BP 140/72 mmHg  Pulse 90  Resp 16  Ht 5\' 6"  (1.676 m)  Wt 162 lb (73.483 kg)  BMI 26.16 kg/m2  SpO2 98% Patient alert and oriented and in  Mild cardiopulmonary distress.Very poor exercise tolerance ,  HEENT: No facial asymmetry, EOMI,   oropharynx pink and moist.  Neck decreased ROM no JVD, no mass.  Chest: adequate air entry, bibasilar crackles, no wheezes  CVS: S1, S2 no murmurs, no S3.Regular rate.  ABD: Soft non tender.   Ext: one plus pitting  Edema edema  MS: Marked kyphosis with reduced ROM spine hips and knees  .       Assessment & Plan:  Medicare annual wellness visit, subsequent Annual exam as documented. Counseling done  re healthy lifestyle , heart healthy diet, and attaining healthy weight.The importance of adequate sleep also discussed. Regular seat belt use and home safety, is also discussed. Changes in health habits are decided on by the patient with goals and time frames  set for achieving them. Immunization needs are specifically addressed at this visit. Pt has markedly reduced mobility, severe osteoporosis and is at high fall risk She and her husband live independently still but have increased needs for assistance int he home, i will attempt to  Get a caseworker to be involved, she does agree to an eval, basically , the desire is to maintain independence fpor as long as possible, remain in their home and get in home help \\Her  spouse has stage 4/ metastatic cancer and they have no children, They do have a niece who is their HPOA, and she has a living will   Need for prophylactic vaccination and inoculation against influenza Vaccine administered at visit.   CAD, NATIVE VESSEL Increased exertional fatigue and leg swelling in the past 2 months, needs to establish with local cardiologist for evaluation will refer

## 2015-01-01 LAB — IRON: Iron: 43 ug/dL (ref 42–145)

## 2015-01-01 LAB — FERRITIN: FERRITIN: 61 ng/mL (ref 10–291)

## 2015-01-01 LAB — VITAMIN D 25 HYDROXY (VIT D DEFICIENCY, FRACTURES): VIT D 25 HYDROXY: 36 ng/mL (ref 30–100)

## 2015-01-05 NOTE — Assessment & Plan Note (Signed)
Vaccine administered at visit.  

## 2015-01-05 NOTE — Assessment & Plan Note (Signed)
Increased exertional fatigue and leg swelling in the past 2 months, needs to establish with local cardiologist for evaluation will refer

## 2015-01-05 NOTE — Assessment & Plan Note (Addendum)
Annual exam as documented. Counseling done  re healthy lifestyle , heart healthy diet, and attaining healthy weight.The importance of adequate sleep also discussed. Regular seat belt use and home safety, is also discussed. Changes in health habits are decided on by the patient with goals and time frames  set for achieving them. Immunization needs are specifically addressed at this visit. Pt has markedly reduced mobility, severe osteoporosis and is at high fall risk She and her husband live independently still but have increased needs for assistance int he home, i will attempt to  Get a caseworker to be involved, she does agree to an eval, basically , the desire is to maintain independence fpor as long as possible, remain in their home and get in home help \\Her  spouse has stage 4/ metastatic cancer and they have no children, They do have a niece who is their HPOA, and she has a living will

## 2015-01-06 ENCOUNTER — Telehealth: Payer: Self-pay | Admitting: Family Medicine

## 2015-01-06 NOTE — Telephone Encounter (Signed)
Tylenol isn't helping for her joint pain (also still having leg swelling) and she wants to either go back to aleve or try something else. Please advise (appt with cardio 1/27)

## 2015-01-06 NOTE — Telephone Encounter (Addendum)
Send lasix 20mg  one daily and potassium 10 meq one daily  For swelling, for joint pain , send meloxicam 7.5 mg one daily. Also please let her know that apart from being slightly anemic , her recent blood work was excellent I would like 3 FOB stool cards submitted for testing at her earliest convenience Pls let her know I have been in touch with the local cardiologist who is expecting her , Dr Raliegh Ip

## 2015-01-07 ENCOUNTER — Other Ambulatory Visit: Payer: Self-pay

## 2015-01-07 MED ORDER — POTASSIUM CHLORIDE CRYS ER 10 MEQ PO TBCR: 10.0000 meq | EXTENDED_RELEASE_TABLET | Freq: Every day | ORAL | Status: DC

## 2015-01-07 MED ORDER — FUROSEMIDE 20 MG PO TABS: 20.0000 mg | ORAL_TABLET | Freq: Every day | ORAL | Status: DC

## 2015-01-07 MED ORDER — MELOXICAM 7.5 MG PO TABS: 7.5000 mg | ORAL_TABLET | Freq: Every day | ORAL | Status: DC

## 2015-01-07 NOTE — Telephone Encounter (Signed)
Pt aware.

## 2015-01-22 ENCOUNTER — Ambulatory Visit: Payer: Medicare Other | Admitting: Cardiovascular Disease

## 2015-01-30 ENCOUNTER — Emergency Department (HOSPITAL_COMMUNITY): Payer: Medicare Other

## 2015-01-30 ENCOUNTER — Encounter (HOSPITAL_COMMUNITY): Payer: Self-pay | Admitting: Emergency Medicine

## 2015-01-30 ENCOUNTER — Inpatient Hospital Stay (HOSPITAL_COMMUNITY)
Admission: EM | Admit: 2015-01-30 | Discharge: 2015-02-03 | DRG: 470 | Disposition: A | Discharge status: Skilled Nursing Facility | Payer: Medicare Other | Attending: Family Medicine | Admitting: Family Medicine

## 2015-01-30 DIAGNOSIS — I10 Essential (primary) hypertension: Secondary | ICD-10-CM | POA: Diagnosis present

## 2015-01-30 DIAGNOSIS — R55 Syncope and collapse: Secondary | ICD-10-CM | POA: Diagnosis present

## 2015-01-30 DIAGNOSIS — Z833 Family history of diabetes mellitus: Secondary | ICD-10-CM | POA: Diagnosis not present

## 2015-01-30 DIAGNOSIS — M1990 Unspecified osteoarthritis, unspecified site: Secondary | ICD-10-CM | POA: Diagnosis present

## 2015-01-30 DIAGNOSIS — M810 Age-related osteoporosis without current pathological fracture: Secondary | ICD-10-CM | POA: Diagnosis present

## 2015-01-30 DIAGNOSIS — S72002A Fracture of unspecified part of neck of left femur, initial encounter for closed fracture: Secondary | ICD-10-CM | POA: Diagnosis present

## 2015-01-30 DIAGNOSIS — Z823 Family history of stroke: Secondary | ICD-10-CM | POA: Diagnosis not present

## 2015-01-30 DIAGNOSIS — W19XXXA Unspecified fall, initial encounter: Secondary | ICD-10-CM | POA: Diagnosis present

## 2015-01-30 DIAGNOSIS — S72012A Unspecified intracapsular fracture of left femur, initial encounter for closed fracture: Principal | ICD-10-CM | POA: Diagnosis present

## 2015-01-30 DIAGNOSIS — Z87891 Personal history of nicotine dependence: Secondary | ICD-10-CM | POA: Diagnosis not present

## 2015-01-30 DIAGNOSIS — S72009A Fracture of unspecified part of neck of unspecified femur, initial encounter for closed fracture: Secondary | ICD-10-CM | POA: Diagnosis present

## 2015-01-30 DIAGNOSIS — D62 Acute posthemorrhagic anemia: Secondary | ICD-10-CM | POA: Diagnosis not present

## 2015-01-30 DIAGNOSIS — Z8781 Personal history of (healed) traumatic fracture: Secondary | ICD-10-CM

## 2015-01-30 DIAGNOSIS — D509 Iron deficiency anemia, unspecified: Secondary | ICD-10-CM | POA: Diagnosis present

## 2015-01-30 DIAGNOSIS — I2510 Atherosclerotic heart disease of native coronary artery without angina pectoris: Secondary | ICD-10-CM | POA: Diagnosis present

## 2015-01-30 DIAGNOSIS — K219 Gastro-esophageal reflux disease without esophagitis: Secondary | ICD-10-CM | POA: Diagnosis present

## 2015-01-30 DIAGNOSIS — Z8249 Family history of ischemic heart disease and other diseases of the circulatory system: Secondary | ICD-10-CM

## 2015-01-30 DIAGNOSIS — E871 Hypo-osmolality and hyponatremia: Secondary | ICD-10-CM | POA: Diagnosis present

## 2015-01-30 DIAGNOSIS — E860 Dehydration: Secondary | ICD-10-CM | POA: Diagnosis present

## 2015-01-30 DIAGNOSIS — Z9889 Other specified postprocedural states: Secondary | ICD-10-CM

## 2015-01-30 DIAGNOSIS — M25552 Pain in left hip: Secondary | ICD-10-CM | POA: Diagnosis present

## 2015-01-30 HISTORY — DX: Other intervertebral disc degeneration, lumbar region without mention of lumbar back pain or lower extremity pain: M51.369

## 2015-01-30 HISTORY — DX: Pain in unspecified knee: M25.569

## 2015-01-30 HISTORY — DX: Fracture of unspecified part of neck of left femur, initial encounter for closed fracture: S72.002A

## 2015-01-30 HISTORY — DX: Anemia, unspecified: D64.9

## 2015-01-30 HISTORY — DX: Edema, unspecified: R60.9

## 2015-01-30 HISTORY — DX: Other fatigue: R53.83

## 2015-01-30 HISTORY — DX: Other intervertebral disc degeneration, lumbar region: M51.36

## 2015-01-30 HISTORY — DX: Other chronic pain: G89.29

## 2015-01-30 HISTORY — DX: Other cervical disc degeneration, unspecified cervical region: M50.30

## 2015-01-30 HISTORY — DX: Localized edema: R60.0

## 2015-01-30 LAB — URINALYSIS, ROUTINE W REFLEX MICROSCOPIC
BILIRUBIN URINE: NEGATIVE
GLUCOSE, UA: NEGATIVE mg/dL
Hgb urine dipstick: NEGATIVE
KETONES UR: NEGATIVE mg/dL
Nitrite: NEGATIVE
PH: 6 (ref 5.0–8.0)
PROTEIN: NEGATIVE mg/dL
Specific Gravity, Urine: 1.015 (ref 1.005–1.030)
Urobilinogen, UA: 0.2 mg/dL (ref 0.0–1.0)

## 2015-01-30 LAB — CBC WITH DIFFERENTIAL/PLATELET
BASOS ABS: 0 10*3/uL (ref 0.0–0.1)
Basophils Relative: 0 % (ref 0–1)
EOS ABS: 0.1 10*3/uL (ref 0.0–0.7)
EOS PCT: 1 % (ref 0–5)
HCT: 31.3 % — ABNORMAL LOW (ref 36.0–46.0)
HEMOGLOBIN: 10.2 g/dL — AB (ref 12.0–15.0)
Lymphocytes Relative: 11 % — ABNORMAL LOW (ref 12–46)
Lymphs Abs: 0.7 10*3/uL (ref 0.7–4.0)
MCH: 30.5 pg (ref 26.0–34.0)
MCHC: 32.6 g/dL (ref 30.0–36.0)
MCV: 93.7 fL (ref 78.0–100.0)
MONO ABS: 0.6 10*3/uL (ref 0.1–1.0)
Monocytes Relative: 8 % (ref 3–12)
Neutro Abs: 5.3 10*3/uL (ref 1.7–7.7)
Neutrophils Relative %: 80 % — ABNORMAL HIGH (ref 43–77)
Platelets: 173 10*3/uL (ref 150–400)
RBC: 3.34 MIL/uL — AB (ref 3.87–5.11)
RDW: 12.1 % (ref 11.5–15.5)
WBC: 6.6 10*3/uL (ref 4.0–10.5)

## 2015-01-30 LAB — URINE MICROSCOPIC-ADD ON

## 2015-01-30 LAB — BASIC METABOLIC PANEL
Anion gap: 10 (ref 5–15)
BUN: 28 mg/dL — ABNORMAL HIGH (ref 6–23)
CO2: 25 mmol/L (ref 19–32)
CREATININE: 0.93 mg/dL (ref 0.50–1.10)
Calcium: 9.5 mg/dL (ref 8.4–10.5)
Chloride: 101 mmol/L (ref 96–112)
GFR calc Af Amer: 63 mL/min — ABNORMAL LOW (ref >90)
GFR calc non Af Amer: 54 mL/min — ABNORMAL LOW (ref >90)
Glucose, Bld: 110 mg/dL — ABNORMAL HIGH (ref 70–99)
POTASSIUM: 4.5 mmol/L (ref 3.5–5.1)
Sodium: 136 mmol/L (ref 135–145)

## 2015-01-30 LAB — TSH: TSH: 2.065 u[IU]/mL (ref 0.350–4.500)

## 2015-01-30 LAB — TROPONIN I
Troponin I: <0.03 ng/mL (ref <0.031)
Troponin I: <0.03 ng/mL (ref <0.031)

## 2015-01-30 LAB — BRAIN NATRIURETIC PEPTIDE: B NATRIURETIC PEPTIDE 5: 119 pg/mL — AB (ref 0.0–100.0)

## 2015-01-30 MED ORDER — NAPROXEN SODIUM 220 MG PO TABS: 220.0000 mg | ORAL_TABLET | Freq: Two times a day (BID) | ORAL | Status: DC

## 2015-01-30 MED ORDER — METHOCARBAMOL 1000 MG/10ML IJ SOLN
1000.0000 mg | Freq: Once | INTRAVENOUS | Status: AC
  Administered 2015-01-30: 1000 mg via INTRAVENOUS
  Filled 2015-01-30: qty 10

## 2015-01-30 MED ORDER — ONDANSETRON HCL 4 MG/2ML IJ SOLN: 4.0000 mg | Freq: Four times a day (QID) | INTRAMUSCULAR | Status: DC | PRN

## 2015-01-30 MED ORDER — ACETAMINOPHEN 500 MG PO TABS: 500.0000 mg | ORAL_TABLET | Freq: Two times a day (BID) | ORAL | Status: DC | PRN

## 2015-01-30 MED ORDER — CALCIUM CARBONATE-VITAMIN D 500-200 MG-UNIT PO TABS
1.0000 | ORAL_TABLET | Freq: Two times a day (BID) | ORAL | Status: DC
  Administered 2015-01-30 – 2015-02-03 (×7): 1 via ORAL
  Filled 2015-01-30 (×7): qty 1

## 2015-01-30 MED ORDER — ONDANSETRON HCL 4 MG PO TABS: 4.0000 mg | ORAL_TABLET | Freq: Four times a day (QID) | ORAL | Status: DC | PRN

## 2015-01-30 MED ORDER — SODIUM CHLORIDE 0.9 % IV SOLN
INTRAVENOUS | Status: DC
  Administered 2015-01-31: 04:00:00 via INTRAVENOUS

## 2015-01-30 MED ORDER — HEPARIN SODIUM (PORCINE) 5000 UNIT/ML IJ SOLN
5000.0000 [IU] | Freq: Three times a day (TID) | INTRAMUSCULAR | Status: DC
  Administered 2015-01-30 – 2015-01-31 (×2): 5000 [IU] via SUBCUTANEOUS
  Filled 2015-01-30 (×2): qty 1

## 2015-01-30 MED ORDER — AMLODIPINE BESYLATE 5 MG PO TABS
5.0000 mg | ORAL_TABLET | Freq: Every day | ORAL | Status: DC
  Administered 2015-01-30 – 2015-02-03 (×5): 5 mg via ORAL
  Filled 2015-01-30 (×5): qty 1

## 2015-01-30 MED ORDER — NAPROXEN 250 MG PO TABS: 250.0000 mg | ORAL_TABLET | Freq: Two times a day (BID) | ORAL | Status: DC

## 2015-01-30 MED ORDER — SODIUM CHLORIDE 0.9 % IJ SOLN
3.0000 mL | Freq: Two times a day (BID) | INTRAMUSCULAR | Status: DC
  Administered 2015-01-30 – 2015-02-03 (×6): 3 mL via INTRAVENOUS

## 2015-01-30 MED ORDER — HYDROMORPHONE HCL 1 MG/ML IJ SOLN
INTRAMUSCULAR | Status: AC
  Administered 2015-01-30: 18:00:00
  Filled 2015-01-30: qty 1

## 2015-01-30 MED ORDER — RAMIPRIL 5 MG PO CAPS
10.0000 mg | ORAL_CAPSULE | Freq: Every day | ORAL | Status: DC
  Administered 2015-01-30 – 2015-02-03 (×5): 10 mg via ORAL
  Filled 2015-01-30 (×5): qty 2

## 2015-01-30 MED ORDER — DOCUSATE SODIUM 100 MG PO CAPS: 100.0000 mg | ORAL_CAPSULE | Freq: Three times a day (TID) | ORAL | Status: DC | PRN

## 2015-01-30 MED ORDER — OMEPRAZOLE MAGNESIUM 20 MG PO TBEC: 40.0000 mg | DELAYED_RELEASE_TABLET | Freq: Two times a day (BID) | ORAL | Status: DC | PRN

## 2015-01-30 MED ORDER — DIAZEPAM 5 MG PO TABS
5.0000 mg | ORAL_TABLET | Freq: Three times a day (TID) | ORAL | Status: DC | PRN
  Administered 2015-01-30 – 2015-02-02 (×4): 5 mg via ORAL
  Filled 2015-01-30 (×4): qty 1

## 2015-01-30 MED ORDER — SODIUM CHLORIDE 0.9 % IV SOLN
INTRAVENOUS | Status: DC
  Administered 2015-01-30: 14:00:00 via INTRAVENOUS

## 2015-01-30 MED ORDER — HYDROMORPHONE HCL 1 MG/ML IJ SOLN
0.5000 mg | INTRAMUSCULAR | Status: DC | PRN
  Administered 2015-01-30: 0.5 mg via INTRAVENOUS

## 2015-01-30 MED ORDER — PANTOPRAZOLE SODIUM 40 MG PO TBEC: 40.0000 mg | DELAYED_RELEASE_TABLET | Freq: Two times a day (BID) | ORAL | Status: DC | PRN

## 2015-01-30 MED ORDER — SODIUM CHLORIDE 0.9 % IV SOLN: INTRAVENOUS | Status: AC

## 2015-01-30 MED ORDER — NITROGLYCERIN 0.4 MG SL SUBL: 0.4000 mg | SUBLINGUAL_TABLET | SUBLINGUAL | Status: DC | PRN

## 2015-01-30 MED ORDER — MORPHINE SULFATE 2 MG/ML IJ SOLN
2.0000 mg | INTRAMUSCULAR | Status: DC | PRN
  Administered 2015-01-31 (×2): 2 mg via INTRAVENOUS
  Filled 2015-01-30 (×2): qty 1

## 2015-01-30 MED ORDER — MELOXICAM 7.5 MG PO TABS: 7.5000 mg | ORAL_TABLET | Freq: Every day | ORAL | Status: DC

## 2015-01-30 MED ORDER — OXYCODONE HCL 5 MG PO TABS
5.0000 mg | ORAL_TABLET | ORAL | Status: DC | PRN
  Administered 2015-01-30: 5 mg via ORAL
  Filled 2015-01-30: qty 1

## 2015-01-30 MED ORDER — MORPHINE SULFATE 2 MG/ML IJ SOLN
2.0000 mg | INTRAMUSCULAR | Status: AC | PRN
  Administered 2015-01-30 (×2): 2 mg via INTRAVENOUS
  Filled 2015-01-30 (×2): qty 1

## 2015-01-30 NOTE — ED Notes (Signed)
Pharmacy will send robaxin.

## 2015-01-30 NOTE — ED Notes (Signed)
Paged Dr. Luna Glasgow per Dr. Thurnell Garbe bc Luna Glasgow didn't return call.  Pt still requesting to see Dr. Aline Brochure

## 2015-01-30 NOTE — ED Provider Notes (Signed)
CSN: [id number]     Arrival date & time 01/30/15  1329 History   First MD Initiated Contact with Patient 01/30/15 1340     Chief Complaint  Patient presents with  . Fall  . Loss of Consciousness  . Hip Pain      HPI Pt was seen at 1350. Per EMS and pt report, c/o sudden onset and persistence of constant left hip "pain" for the past 3 days. Pt states she "passed out" 3 days ago, and fell onto her left hip. Pt states she also "hit my head" when she fell. Pt states she has been walking with her walker due to the pain in her left hip but was "stuck on the toilet today" and needed to call EMS to help her stand up. Pt states she was started on lasix on Monday "for my legs swelling" and took a dose before her syncopal episode. Denies CP/palpitations, no SOB/cough, no abd pain, no N/V/D, no focal motor weakness, no tingling/numbness in extremities.    Past Medical History  Diagnosis Date  . Hemorrhoids   . Intermittent vertigo   . Osteoporosis     knees  . Benign recurrent vertigo   . Vitamin D deficiency   . Head trauma     status post fall; upper and lower extremities   . GERD (gastroesophageal reflux disease)   . Diverticulosis   . Chronic constipation   . Osteoarthritis     of the neck  . Dermatitis     recurrent  . Hypertension   . Barrett's esophagus   . Infertility, female   . Peripheral edema     chronic  . Chronic knee pain   . Fatigue     chronic  . DDD (degenerative disc disease), lumbar   . DDD (degenerative disc disease), cervical    Past Surgical History  Procedure Laterality Date  . Knee arthroscopy  1999    rt  . Knee arthroscopy  1996    left   . Cholecystectomy, laparoscopic  2007    with stones  . Rotator cuff repair  May 14 2010  . Cataract extraction w/ intraocular lens implant      left, [facility]  . Eye surgery  2013    bilateral cataract extraction  . Colonoscopy  04/19/2012    Procedure: COLONOSCOPY;  Surgeon: Rogene Houston, MD;  Location:  [facility];  Service: Endoscopy;  Laterality: N/A;  1200  . Tonsillectomy and adenoidectomy  1934  . Palate surgery  1988  . Dental implant   Oct 2015   Family History  Problem Relation Age of Onset  . Parkinsonism Sister   . Diabetes Mother   . Hypertension Mother     cva  . CVA Mother   . Kidney disease Mother   . Heart failure Mother   . Colon cancer Neg Hx   . Cancer Father     larengeal  . Heart failure Father     CHF  . Heart disease Father   . Osteoporosis Sister    History  Substance Use Topics  . Smoking status: Former Smoker -- 0.25 packs/day for .5 years  . Smokeless tobacco: Not on file  . Alcohol Use: 0.6 oz/week    1 Glasses of wine per week     Comment: occassionally wine with a meal   OB History    Gravida Para Term Preterm AB TAB SAB Ectopic Multiple Living   0 0  Review of Systems ROS: Statement: All systems negative except as marked or noted in the HPI; Constitutional: Negative for fever and chills. ; ; Eyes: Negative for eye pain, redness and discharge. ; ; ENMT: Negative for ear pain, hoarseness, nasal congestion, sinus pressure and sore throat. ; ; Cardiovascular: Negative for chest pain, palpitations, diaphoresis, dyspnea and +peripheral edema. ; ; Respiratory: Negative for cough, wheezing and stridor. ; ; Gastrointestinal: Negative for nausea, vomiting, diarrhea, abdominal pain, blood in stool, hematemesis, jaundice and rectal bleeding. . ; ; Genitourinary: Negative for dysuria, flank pain and hematuria. ; ; Musculoskeletal: +left hip pain. Negative for back pain and neck pain. Negative for swelling and deformity.; ; Skin: Negative for pruritus, rash, abrasions, blisters, bruising and skin lesion.; ; Neuro: Negative for headache, lightheadedness and neck stiffness. Negative for extremity weakness, paresthesias, involuntary movement, seizure and +syncope.      Allergies  Clarithromycin; Flagyl; and Penicillins  Home Medications   Prior  to Admission medications   Medication Sig Start Date End Date Taking? Authorizing Provider  acetaminophen (TYLENOL) 500 MG tablet Take 500 mg by mouth 2 (two) times daily as needed (pain). pain    Historical Provider, MD  amLODipine (NORVASC) 5 MG tablet TAKE 1 TABLET BY MOUTH ONCE DAILY 11/15/14   Fayrene Helper, MD  Calcium-Vitamin D-Vitamin K [id number] MG-UNT-MCG CHEW Chew 1 tablet by mouth 2 (two) times daily.     Historical Provider, MD  diazepam (VALIUM) 5 MG tablet Take 1 tablet (5 mg total) by mouth every 8 (eight) hours as needed for muscle spasms. 12/02/14   John L Molpus, MD  docusate sodium (COLACE) 100 MG capsule Take 100 mg by mouth 3 (three) times daily as needed (constipation).     Historical Provider, MD  furosemide (LASIX) 20 MG tablet Take 1 tablet (20 mg total) by mouth daily. 01/07/15   Fayrene Helper, MD  Magnesium Oxide (PHILLIPS) 500 MG (LAX) TABS Take 1 tablet by mouth daily.     Historical Provider, MD  meclizine (ANTIVERT) 12.5 MG tablet Take 1 tablet (12.5 mg total) by mouth as needed. vertigo 07/26/12   Fayrene Helper, MD  meloxicam (MOBIC) 7.5 MG tablet Take 1 tablet (7.5 mg total) by mouth daily. 01/07/15   Fayrene Helper, MD  Misc Natural Products (COSAMIN ASU ADVANCED FORMULA) CAPS Take 1 capsule by mouth 3 (three) times daily with meals.     Historical Provider, MD  Multiple Vitamin (MULTIVITAMIN) tablet Take 1 tablet by mouth daily.      Historical Provider, MD  naproxen sodium (ANAPROX) 220 MG tablet Take 220 mg by mouth 2 (two) times daily with a meal.     Historical Provider, MD  nitroGLYCERIN (NITROSTAT) 0.4 MG SL tablet Place 0.4 mg under the tongue every 5 (five) minutes as needed. 1 tablet under the tongue at onset of chest pain: you may repeat every 5 minutes for up to 3 doses    Historical Provider, MD  omeprazole (PRILOSEC OTC) 20 MG tablet Take 40 mg by mouth 2 (two) times daily as needed (acid reflux).     Historical Provider, MD  omeprazole  (PRILOSEC) 20 MG capsule Take 1 capsule (20 mg total) by mouth 2 (two) times daily before a meal. 04/25/12 12/02/14  Butch Penny, NP  potassium chloride (KLOR-CON M10) 10 MEQ tablet Take 1 tablet (10 mEq total) by mouth daily. 01/07/15   Fayrene Helper, MD  Probiotic Product (HEALTHY COLON) CAPS Take by mouth daily.  Historical Provider, MD  ramipril (ALTACE) 10 MG capsule TAKE ONE CAPSULE BY MOUTH EVERY DAY 10/19/14   Fayrene Helper, MD  Wheat Dextrin [facility]) POWD Take 2 scoop by mouth 2 (two) times daily.     Historical Provider, MD   BP 149/56 mmHg  Pulse 94  Temp(Src) 98.1 F (36.7 C) (Oral)  Resp 18  Ht 5\' 5"  (1.651 m)  Wt 163 lb (73.936 kg)  BMI 27.12 kg/m2  SpO2 98% Physical Exam  1355: Physical examination:  Nursing notes reviewed; Vital signs and O2 SAT reviewed;  Constitutional: Well developed, Well nourished, Uncomfortable appearing.; Head:  Normocephalic, atraumatic; Eyes: EOMI, PERRL, No scleral icterus; ENMT: Mouth and pharynx normal, Mucous membranes dry; Neck: Supple, Full range of motion, No lymphadenopathy; Cardiovascular: Regular rate and rhythm, No gallop; Respiratory: Breath sounds clear & equal bilaterally, No rales, rhonchi, wheezes.  Speaking full sentences with ease, Normal respiratory effort/excursion; Chest: Nontender, Movement normal; Abdomen: Soft, Nontender, Nondistended, Normal bowel sounds; Genitourinary: No CVA tenderness; Spine:  No midline CS, TS, LS tenderness.;; Extremities: Pulses normal, +left hip tenderness, decreased ROM due to pain. No obvious deformity. NT left knee/ankle/foot. +2 pedal edema without calf asymmetry.; Neuro: AA&Ox3, Major CN grossly intact. No facial droop. Speech clear. No gross focal motor or sensory deficits in extremities.; Skin: Color normal, Warm, Dry.   ED Course  Procedures     EKG Interpretation   Date/Time:  Thursday January 30 2015 14:35:41 EST Ventricular Rate:  97 PR Interval:  199 QRS Duration:  90 QT Interval:  335 QTC Calculation: 425 R Axis:   32 Text Interpretation:  Sinus rhythm Abnormal R-wave progression, early  transition Artifact No old tracing to compare Confirmed by [facility]  MD,  Nunzio Cory [phone]) on 01/30/2015 2:38:08 PM       MDM  MDM Reviewed: previous chart, nursing note and vitals Reviewed previous: labs and ECG Interpretation: labs, ECG, x-ray and CT scan     Results for orders placed or performed during the hospital encounter of 01/30/15  Urinalysis, Routine w reflex microscopic  Result Value Ref Range   Color, Urine YELLOW YELLOW   APPearance CLEAR CLEAR   Specific Gravity, Urine 1.015 1.005 - 1.030   pH 6.0 5.0 - 8.0   Glucose, UA NEGATIVE NEGATIVE mg/dL   Hgb urine dipstick NEGATIVE NEGATIVE   Bilirubin Urine NEGATIVE NEGATIVE   Ketones, ur NEGATIVE NEGATIVE mg/dL   Protein, ur NEGATIVE NEGATIVE mg/dL   Urobilinogen, UA 0.2 0.0 - 1.0 mg/dL   Nitrite NEGATIVE NEGATIVE   Leukocytes, UA MODERATE (A) NEGATIVE  Basic metabolic panel  Result Value Ref Range   Sodium 136 135 - 145 mmol/L   Potassium 4.5 3.5 - 5.1 mmol/L   Chloride 101 96 - 112 mmol/L   CO2 25 19 - 32 mmol/L   Glucose, Bld 110 (H) 70 - 99 mg/dL   BUN 28 (H) 6 - 23 mg/dL   Creatinine, Ser 0.93 0.50 - 1.10 mg/dL   Calcium 9.5 8.4 - 10.5 mg/dL   GFR calc non Af Amer 54 (L) >90 mL/min   GFR calc Af Amer 63 (L) >90 mL/min   Anion gap 10 5 - 15  CBC with Differential/Platelet  Result Value Ref Range   WBC 6.6 4.0 - 10.5 K/uL   RBC 3.34 (L) 3.87 - 5.11 MIL/uL   Hemoglobin 10.2 (L) 12.0 - 15.0 g/dL   HCT 31.3 (L) 36.0 - 46.0 %   MCV 93.7 78.0 - 100.0 fL  MCH 30.5 26.0 - 34.0 pg   MCHC 32.6 30.0 - 36.0 g/dL   RDW 12.1 11.5 - 15.5 %   Platelets 173 150 - 400 K/uL   Neutrophils Relative % 80 (H) 43 - 77 %   Neutro Abs 5.3 1.7 - 7.7 K/uL   Lymphocytes Relative 11 (L) 12 - 46 %   Lymphs Abs 0.7 0.7 - 4.0 K/uL   Monocytes Relative 8 3 - 12 %   Monocytes Absolute 0.6 0.1 - 1.0 K/uL    Eosinophils Relative 1 0 - 5 %   Eosinophils Absolute 0.1 0.0 - 0.7 K/uL   Basophils Relative 0 0 - 1 %   Basophils Absolute 0.0 0.0 - 0.1 K/uL  Troponin I  Result Value Ref Range   Troponin I <0.03 <0.031 ng/mL  Brain natriuretic peptide  Result Value Ref Range   B Natriuretic Peptide 119.0 (H) 0.0 - 100.0 pg/mL  Urine microscopic-add on  Result Value Ref Range   Squamous Epithelial / LPF RARE RARE   WBC, UA 7-10 <3 WBC/hpf   Bacteria, UA RARE RARE   Dg Chest 1 View 01/30/2015   CLINICAL DATA:  Left hip fracture.  Preoperative exam.  EXAM: CHEST  1 VIEW  COMPARISON:  PA and lateral chest 04/04/2014.  FINDINGS: The lungs are clear. Heart size is normal. No pneumothorax or pleural effusion. Postoperative change right shoulder is noted.  IMPRESSION: No acute disease.   Electronically Signed   By: Inge Rise M.D.   On: 01/30/2015 14:49   Dg Hip Unilat With Pelvis 2-3 Views Left 01/30/2015   CLINICAL DATA:  Patient fell 3 days prior.  Progressive pain  EXAM: LEFT HIP (WITH PELVIS) 2-3 VIEWS  COMPARISON:  None.  FINDINGS: Frontal pelvis as well as frontal and lateral left hip images were obtained. There is a subcapital femoral neck fracture on the left with varus angulation of the fracture site and mild displacement of fracture fragments. No other fractures. No dislocation. There is moderate symmetric narrowing of both hip joints. Bones are osteoporotic.  IMPRESSION: Subcapital femoral neck fracture on the left with mild displacement of fracture fragments and varus angulation of the fracture site. No dislocation. Bones diffusely osteoporotic.   Electronically Signed   By: Lowella Grip M.D.   On: 01/30/2015 14:50   Ct Head Wo Contrast 01/30/2015   CLINICAL DATA:  Fall.  EXAM: CT HEAD WITHOUT CONTRAST  TECHNIQUE: Contiguous axial images were obtained from the base of the skull through the vertex without intravenous contrast.  COMPARISON:  None  FINDINGS: Prominence of the sulci and ventricles  identified consistent with brain atrophy. Diffuse low attenuation within the subcortical and periventricular white matter noted compatible with chronic microvascular disease. There is no evidence for acute brain hemorrhage, infarct or intracranial mass. No abnormal extra-axial fluid collections. The calvarium appears intact. The mastoid air cells and paranasal sinuses are clear.  IMPRESSION: 1. No acute intracranial abnormalities. 2. Small vessel ischemic disease and brain atrophy.   Electronically Signed   By: Kerby Moors M.D.   On: 01/30/2015 16:47    1500:  H/H per baseline. VS remain stable. Dx and testing d/w pt and family.  Questions answered.  Verb understanding, agreeable to admit. Pt requesting to see Ortho Dr. Aline Brochure. T/C to Ortho Dr. Aline Brochure, case discussed, including:  HPI, pertinent PM/SHx, VS/PE, dx testing, ED course and treatment:  States he will see her in the morning, please admit to medicine service. T/C to [facility]  Dr. Anastasio Champion, case discussed, including:  HPI, pertinent PM/SHx, VS/PE, dx testing, ED course and treatment:  Agreeable to admit, requests to write temporary orders, obtain tele bed to team [facility].   Francine Graven, DO 02/01/15 2048

## 2015-01-30 NOTE — ED Notes (Signed)
Pt. Fell on Monday, c/o left hip pain, called out to EMS, pt was using restroom and could not get up from toilet. Pt. Reports being on a new medication for fluid retention and she may have passed out on Monday causing the fall. Reports hitting head on Monday.

## 2015-01-30 NOTE — ED Notes (Signed)
Nurse to call back for report, in a pt's room at present. 

## 2015-01-30 NOTE — ED Notes (Signed)
Called pt's husband to let him know wife's room number.

## 2015-01-30 NOTE — H&P (Signed)
[facility] History and Physical  Kerry Flynn EXB:[id number] DOB: 1927-09-22 DOA: 01/30/2015  Referring physician: ER PCP: Tula Nakayama, MD   Chief Complaint: Fall. Left hip pain.  HPI: Kerry Flynn is a 81 y.o. female  This is an 81 year old lady who had a fall 3 days ago after she had lost consciousness for a few seconds. She does not recall why/how she lost consciousness and this has never happened to her before. She denies any chest pain, palpitations prior to the event nor does she have a history of post ictal/ictal type of symptoms. When she passed out, she fell onto her left hip and also hit her head. Since this time, she has been walking with a walker without any neurological symptoms. Today, when she went to the toilet, she was unable to get up and called EMS. She was started on Lasix 3 days ago for leg swelling and she took a pill of Lasix prior to her having syncopal event. I suspect the Lasix was the cause of the episode. Currently, she denies any chest pain, palpitations, dyspnea, nausea, vomiting, abdominal pain or any motor weakness in arms or legs. Evaluation in the emergency room showed her to have a closed left hip fracture and she is now being admitted for management of this.   Review of Systems:  Apart from symptoms above, all systems negative.  Past Medical History  Diagnosis Date  . Hemorrhoids   . Intermittent vertigo   . Osteoporosis     knees  . Benign recurrent vertigo   . Vitamin D deficiency   . Head trauma     status post fall; upper and lower extremities   . GERD (gastroesophageal reflux disease)   . Diverticulosis   . Chronic constipation   . Osteoarthritis     of the neck  . Dermatitis     recurrent  . Hypertension   . Barrett's esophagus   . Infertility, female   . Peripheral edema     chronic  . Chronic knee pain   . Fatigue     chronic  . DDD (degenerative disc disease), lumbar   . DDD (degenerative disc disease), cervical     Past Surgical History  Procedure Laterality Date  . Knee arthroscopy  1999    rt  . Knee arthroscopy  1996    left   . Cholecystectomy, laparoscopic  2007    with stones  . Rotator cuff repair  May 14 2010  . Cataract extraction w/ intraocular lens implant      left, [facility]  . Eye surgery  2013    bilateral cataract extraction  . Colonoscopy  04/19/2012    Procedure: COLONOSCOPY;  Surgeon: Rogene Houston, MD;  Location: [facility];  Service: Endoscopy;  Laterality: N/A;  1200  . Tonsillectomy and adenoidectomy  1934  . Palate surgery  1988  . Dental implant   Oct 2015   Social History:  reports that she has quit smoking. She does not have any smokeless tobacco history on file. She reports that she drinks about 0.6 oz of alcohol per week. She reports that she does not use illicit drugs.  Allergies  Allergen Reactions  . Clarithromycin   . Flagyl [Metronidazole]   . Penicillins     Family History  Problem Relation Age of Onset  . Parkinsonism Sister   . Diabetes Mother   . Hypertension Mother     cva  . CVA Mother   .  Kidney disease Mother   . Heart failure Mother   . Colon cancer Neg Hx   . Cancer Father     larengeal  . Heart failure Father     CHF  . Heart disease Father   . Osteoporosis Sister       Prior to Admission medications   Medication Sig Start Date End Date Taking? Authorizing Provider  acetaminophen (TYLENOL) 500 MG tablet Take 500 mg by mouth 2 (two) times daily as needed (pain). pain    Historical Provider, MD  amLODipine (NORVASC) 5 MG tablet TAKE 1 TABLET BY MOUTH ONCE DAILY 11/15/14   Fayrene Helper, MD  Calcium-Vitamin D-Vitamin K [id number] MG-UNT-MCG CHEW Chew 1 tablet by mouth 2 (two) times daily.     Historical Provider, MD  diazepam (VALIUM) 5 MG tablet Take 1 tablet (5 mg total) by mouth every 8 (eight) hours as needed for muscle spasms. 12/02/14   John L Molpus, MD  docusate sodium (COLACE) 100 MG capsule Take 100 mg by  mouth 3 (three) times daily as needed (constipation).     Historical Provider, MD  furosemide (LASIX) 20 MG tablet Take 1 tablet (20 mg total) by mouth daily. 01/07/15   Fayrene Helper, MD  Magnesium Oxide (PHILLIPS) 500 MG (LAX) TABS Take 1 tablet by mouth daily.     Historical Provider, MD  meclizine (ANTIVERT) 12.5 MG tablet Take 1 tablet (12.5 mg total) by mouth as needed. vertigo 07/26/12   Fayrene Helper, MD  meloxicam (MOBIC) 7.5 MG tablet Take 1 tablet (7.5 mg total) by mouth daily. 01/07/15   Fayrene Helper, MD  Misc Natural Products (COSAMIN ASU ADVANCED FORMULA) CAPS Take 1 capsule by mouth 3 (three) times daily with meals.     Historical Provider, MD  Multiple Vitamin (MULTIVITAMIN) tablet Take 1 tablet by mouth daily.      Historical Provider, MD  naproxen sodium (ANAPROX) 220 MG tablet Take 220 mg by mouth 2 (two) times daily with a meal.     Historical Provider, MD  nitroGLYCERIN (NITROSTAT) 0.4 MG SL tablet Place 0.4 mg under the tongue every 5 (five) minutes as needed. 1 tablet under the tongue at onset of chest pain: you may repeat every 5 minutes for up to 3 doses    Historical Provider, MD  omeprazole (PRILOSEC OTC) 20 MG tablet Take 40 mg by mouth 2 (two) times daily as needed (acid reflux).     Historical Provider, MD  omeprazole (PRILOSEC) 20 MG capsule Take 1 capsule (20 mg total) by mouth 2 (two) times daily before a meal. 04/25/12 12/02/14  Butch Penny, NP  potassium chloride (KLOR-CON M10) 10 MEQ tablet Take 1 tablet (10 mEq total) by mouth daily. 01/07/15   Fayrene Helper, MD  Probiotic Product (HEALTHY COLON) CAPS Take by mouth daily.     Historical Provider, MD  ramipril (ALTACE) 10 MG capsule TAKE ONE CAPSULE BY MOUTH EVERY DAY 10/19/14   Fayrene Helper, MD  Wheat Dextrin [facility]) POWD Take 2 scoop by mouth 2 (two) times daily.     Historical Provider, MD   Physical Exam: Filed Vitals:   01/30/15 1430 01/30/15 1500 01/30/15 1530 01/30/15 1755  BP:  162/67 139/53 149/56 147/59  Pulse: 96 89 94 94  Temp:    98.4 F (36.9 C)  TempSrc:    Oral  Resp: 18 16 18    Height:    5\' 5"  (1.651 m)  Weight:  73.9 kg (162 lb 14.7 oz)  SpO2: 99% 95% 98% 96%    Wt Readings from Last 3 Encounters:  01/30/15 73.9 kg (162 lb 14.7 oz)  12/31/14 73.483 kg (162 lb)  12/02/14 70.308 kg (155 lb)    General:  Appears calm and comfortable. Eyes: PERRL, normal lids, irises & conjunctiva ENT: grossly normal hearing, lips & tongue Neck: no LAD, masses or thyromegaly Cardiovascular: RRR, no m/r/g. No LE edema. Telemetry: SR, no arrhythmias  Respiratory: CTA bilaterally, no w/r/r. Normal respiratory effort. Abdomen: soft, ntnd Skin: no rash or induration seen on limited exam Musculoskeletal: Left leg is shortened and somewhat externally rotated, consistent with left hip fracture. Psychiatric: grossly normal mood and affect, speech fluent and appropriate Neurologic: grossly non-focal.          Labs on Admission:  Basic Metabolic Panel:  Recent Labs Lab 01/30/15 1400  NA 136  K 4.5  CL 101  CO2 25  GLUCOSE 110*  BUN 28*  CREATININE 0.93  CALCIUM 9.5   Liver Function Tests: No results for input(s): AST, ALT, ALKPHOS, BILITOT, PROT, ALBUMIN in the last 168 hours. No results for input(s): LIPASE, AMYLASE in the last 168 hours. No results for input(s): AMMONIA in the last 168 hours. CBC:  Recent Labs Lab 01/30/15 1400  WBC 6.6  NEUTROABS 5.3  HGB 10.2*  HCT 31.3*  MCV 93.7  PLT 173   Cardiac Enzymes:  Recent Labs Lab 01/30/15 1400  TROPONINI <0.03    BNP (last 3 results)  Recent Labs  01/30/15 1400  BNP 119.0*    ProBNP (last 3 results) No results for input(s): PROBNP in the last 8760 hours.  CBG: No results for input(s): GLUCAP in the last 168 hours.  Radiological Exams on Admission: Dg Chest 1 View  01/30/2015   CLINICAL DATA:  Left hip fracture.  Preoperative exam.  EXAM: CHEST  1 VIEW  COMPARISON:  PA and  lateral chest 04/04/2014.  FINDINGS: The lungs are clear. Heart size is normal. No pneumothorax or pleural effusion. Postoperative change right shoulder is noted.  IMPRESSION: No acute disease.   Electronically Signed   By: Inge Rise M.D.   On: 01/30/2015 14:49   Ct Head Wo Contrast  01/30/2015   CLINICAL DATA:  Fall.  EXAM: CT HEAD WITHOUT CONTRAST  TECHNIQUE: Contiguous axial images were obtained from the base of the skull through the vertex without intravenous contrast.  COMPARISON:  None  FINDINGS: Prominence of the sulci and ventricles identified consistent with brain atrophy. Diffuse low attenuation within the subcortical and periventricular white matter noted compatible with chronic microvascular disease. There is no evidence for acute brain hemorrhage, infarct or intracranial mass. No abnormal extra-axial fluid collections. The calvarium appears intact. The mastoid air cells and paranasal sinuses are clear.  IMPRESSION: 1. No acute intracranial abnormalities. 2. Small vessel ischemic disease and brain atrophy.   Electronically Signed   By: Kerby Moors M.D.   On: 01/30/2015 16:47   Dg Hip Unilat With Pelvis 2-3 Views Left  01/30/2015   CLINICAL DATA:  Patient fell 3 days prior.  Progressive pain  EXAM: LEFT HIP (WITH PELVIS) 2-3 VIEWS  COMPARISON:  None.  FINDINGS: Frontal pelvis as well as frontal and lateral left hip images were obtained. There is a subcapital femoral neck fracture on the left with varus angulation of the fracture site and mild displacement of fracture fragments. No other fractures. No dislocation. There is moderate symmetric narrowing of both hip joints.  Bones are osteoporotic.  IMPRESSION: Subcapital femoral neck fracture on the left with mild displacement of fracture fragments and varus angulation of the fracture site. No dislocation. Bones diffusely osteoporotic.   Electronically Signed   By: Lowella Grip M.D.   On: 01/30/2015 14:50    EKG: Independently reviewed.  Normal sinus rhythm without any acute ST-T wave changes.  Assessment/Plan   1. Closed left hip fracture. Orthopedics has been consulted and Dr. Aline Brochure will see this patient tomorrow. She will need operative repair. We will give her analgesia as required. 2. Syncope. I suspect this was related to one dose of Lasix. We will obtain serial cardiac enzymes, although I'm very dubious that she had any kind of acute cardiac event. She certainly does not appear to have had any kind of stroke. She is slightly clinically dehydrated and BUN is slightly elevated. She will be given gentle IV fluids. Lasix will be held for the time being. 3. Osteoporosis. This is likely to have been involved in her fracture. 4. Hypertension, stable.  Further recommendations will depend on patient's hospital progress.   Code Status: Full code.  DVT Prophylaxis: Heparin.  Family Communication: I discussed the plan with the patient at the bedside.  Disposition Plan: Will require rehabilitation upon discharge, likely to a skilled nursing facility.   Time spent: 60 minutes.  Doree Albee [facility] Pager [phone].

## 2015-01-31 ENCOUNTER — Encounter (HOSPITAL_COMMUNITY): Payer: Self-pay | Admitting: Anesthesiology

## 2015-01-31 ENCOUNTER — Inpatient Hospital Stay (HOSPITAL_COMMUNITY): Payer: Medicare Other | Admitting: Anesthesiology

## 2015-01-31 ENCOUNTER — Encounter (HOSPITAL_COMMUNITY)
Admission: EM | Disposition: A | Discharge status: Skilled Nursing Facility | Payer: Self-pay | Source: Home / Self Care | Attending: Family Medicine

## 2015-01-31 ENCOUNTER — Inpatient Hospital Stay (HOSPITAL_COMMUNITY): Payer: Medicare Other

## 2015-01-31 DIAGNOSIS — S72009A Fracture of unspecified part of neck of unspecified femur, initial encounter for closed fracture: Secondary | ICD-10-CM | POA: Diagnosis present

## 2015-01-31 DIAGNOSIS — D509 Iron deficiency anemia, unspecified: Secondary | ICD-10-CM | POA: Diagnosis present

## 2015-01-31 DIAGNOSIS — R55 Syncope and collapse: Secondary | ICD-10-CM | POA: Insufficient documentation

## 2015-01-31 DIAGNOSIS — W19XXXA Unspecified fall, initial encounter: Secondary | ICD-10-CM

## 2015-01-31 HISTORY — PX: HIP ARTHROPLASTY: SHX981

## 2015-01-31 LAB — CBC
HCT: 34.9 % — ABNORMAL LOW (ref 36.0–46.0)
HEMATOCRIT: 28.8 % — AB (ref 36.0–46.0)
HEMOGLOBIN: 9.5 g/dL — AB (ref 12.0–15.0)
Hemoglobin: 11.4 g/dL — ABNORMAL LOW (ref 12.0–15.0)
MCH: 30.8 pg (ref 26.0–34.0)
MCH: 30.6 pg (ref 26.0–34.0)
MCHC: 33 g/dL (ref 30.0–36.0)
MCHC: 32.7 g/dL (ref 30.0–36.0)
MCV: 93.5 fL (ref 78.0–100.0)
MCV: 93.8 fL (ref 78.0–100.0)
PLATELETS: 164 10*3/uL (ref 150–400)
PLATELETS: 176 10*3/uL (ref 150–400)
RBC: 3.08 MIL/uL — AB (ref 3.87–5.11)
RBC: 3.72 MIL/uL — ABNORMAL LOW (ref 3.87–5.11)
RDW: 12.1 % (ref 11.5–15.5)
RDW: 12.1 % (ref 11.5–15.5)
WBC: 5.9 10*3/uL (ref 4.0–10.5)
WBC: 9.9 10*3/uL (ref 4.0–10.5)

## 2015-01-31 LAB — SURGICAL PCR SCREEN
MRSA, PCR: NEGATIVE
STAPHYLOCOCCUS AUREUS: NEGATIVE

## 2015-01-31 LAB — PREPARE RBC (CROSSMATCH)

## 2015-01-31 LAB — COMPREHENSIVE METABOLIC PANEL
ALBUMIN: 3 g/dL — AB (ref 3.5–5.2)
ALT: 14 U/L (ref 0–35)
AST: 24 U/L (ref 0–37)
Alkaline Phosphatase: 55 U/L (ref 39–117)
Anion gap: 7 (ref 5–15)
BUN: 18 mg/dL (ref 6–23)
CALCIUM: 9 mg/dL (ref 8.4–10.5)
CHLORIDE: 103 mmol/L (ref 96–112)
CO2: 26 mmol/L (ref 19–32)
Creatinine, Ser: 0.67 mg/dL (ref 0.50–1.10)
GFR calc Af Amer: >90 mL/min (ref >90)
GFR calc non Af Amer: 78 mL/min — ABNORMAL LOW (ref >90)
Glucose, Bld: 99 mg/dL (ref 70–99)
Potassium: 3.9 mmol/L (ref 3.5–5.1)
SODIUM: 136 mmol/L (ref 135–145)
TOTAL PROTEIN: 5.9 g/dL — AB (ref 6.0–8.3)
Total Bilirubin: 0.6 mg/dL (ref 0.3–1.2)

## 2015-01-31 LAB — TROPONIN I: Troponin I: <0.03 ng/mL (ref <0.031)

## 2015-01-31 LAB — IRON AND TIBC
Iron: 44 ug/dL (ref 42–145)
SATURATION RATIOS: 17 % — AB (ref 20–55)
TIBC: 258 ug/dL (ref 250–470)
UIBC: 214 ug/dL (ref 125–400)

## 2015-01-31 LAB — VITAMIN B12: Vitamin B-12: 1169 pg/mL — ABNORMAL HIGH (ref 211–911)

## 2015-01-31 LAB — FERRITIN: FERRITIN: 104 ng/mL (ref 10–291)

## 2015-01-31 LAB — RETICULOCYTES
RBC.: 3.15 MIL/uL — AB (ref 3.87–5.11)
RETIC CT PCT: 1 % (ref 0.4–3.1)
Retic Count, Absolute: 31.5 10*3/uL (ref 19.0–186.0)

## 2015-01-31 LAB — ABO/RH: ABO/RH(D): AB POS

## 2015-01-31 LAB — FOLATE: Folate: >20 ng/mL

## 2015-01-31 SURGERY — HEMIARTHROPLASTY, HIP, DIRECT ANTERIOR APPROACH, FOR FRACTURE
Anesthesia: Spinal | Site: Hip | Laterality: Left

## 2015-01-31 MED ORDER — ALUM & MAG HYDROXIDE-SIMETH 200-200-20 MG/5ML PO SUSP
30.0000 mL | ORAL | Status: DC | PRN
Start: 2015-01-31 — End: 2015-02-03

## 2015-01-31 MED ORDER — PROPOFOL 10 MG/ML IV BOLUS
INTRAVENOUS | Status: AC
  Filled 2015-01-31: qty 20

## 2015-01-31 MED ORDER — LIDOCAINE HCL (CARDIAC) 10 MG/ML IV SOLN
INTRAVENOUS | Status: DC | PRN
  Administered 2015-01-31: 50 mg via INTRAVENOUS

## 2015-01-31 MED ORDER — ONDANSETRON HCL 4 MG/2ML IJ SOLN
4.0000 mg | Freq: Once | INTRAMUSCULAR | Status: AC
  Administered 2015-01-31: 4 mg via INTRAVENOUS

## 2015-01-31 MED ORDER — LACTATED RINGERS IV SOLN
INTRAVENOUS | Status: DC | PRN
  Administered 2015-01-31: 13:00:00 via INTRAVENOUS

## 2015-01-31 MED ORDER — HYDROCODONE-ACETAMINOPHEN 5-325 MG PO TABS
1.0000 | ORAL_TABLET | Freq: Four times a day (QID) | ORAL | Status: DC | PRN
  Administered 2015-02-01: 2 via ORAL
  Administered 2015-02-01: 1 via ORAL
  Administered 2015-02-02 – 2015-02-03 (×4): 2 via ORAL
  Filled 2015-01-31 (×3): qty 2
  Filled 2015-01-31: qty 1
  Filled 2015-01-31 (×2): qty 2

## 2015-01-31 MED ORDER — METOCLOPRAMIDE HCL 5 MG/ML IJ SOLN: 5.0000 mg | Freq: Three times a day (TID) | INTRAMUSCULAR | Status: DC | PRN

## 2015-01-31 MED ORDER — BUPIVACAINE-EPINEPHRINE (PF) 0.5% -1:200000 IJ SOLN
INTRAMUSCULAR | Status: DC | PRN
  Administered 2015-01-31: 60 mL

## 2015-01-31 MED ORDER — METOCLOPRAMIDE HCL 10 MG PO TABS: 5.0000 mg | ORAL_TABLET | Freq: Three times a day (TID) | ORAL | Status: DC | PRN

## 2015-01-31 MED ORDER — MORPHINE SULFATE 2 MG/ML IJ SOLN
2.0000 mg | INTRAMUSCULAR | Status: DC | PRN
  Administered 2015-01-31: 2 mg via INTRAVENOUS
  Filled 2015-01-31: qty 1

## 2015-01-31 MED ORDER — POLYETHYLENE GLYCOL 3350 17 G PO PACK
17.0000 g | PACK | Freq: Every day | ORAL | Status: DC
  Administered 2015-01-31 – 2015-02-02 (×3): 17 g via ORAL
  Filled 2015-01-31 (×4): qty 1

## 2015-01-31 MED ORDER — BUPIVACAINE IN DEXTROSE 0.75-8.25 % IT SOLN
INTRATHECAL | Status: AC
  Filled 2015-01-31: qty 2

## 2015-01-31 MED ORDER — FENTANYL CITRATE 0.05 MG/ML IJ SOLN
25.0000 ug | INTRAMUSCULAR | Status: AC
  Administered 2015-01-31 (×2): 25 ug via INTRAVENOUS

## 2015-01-31 MED ORDER — ENOXAPARIN SODIUM 30 MG/0.3ML ~~LOC~~ SOLN
30.0000 mg | SUBCUTANEOUS | Status: DC
  Administered 2015-02-01 – 2015-02-03 (×3): 30 mg via SUBCUTANEOUS
  Filled 2015-01-31 (×3): qty 0.3

## 2015-01-31 MED ORDER — SODIUM CHLORIDE 0.9 % IV SOLN: Freq: Once | INTRAVENOUS | Status: AC

## 2015-01-31 MED ORDER — FENTANYL CITRATE 0.05 MG/ML IJ SOLN
INTRAMUSCULAR | Status: DC | PRN
  Administered 2015-01-31 (×2): 25 ug via INTRAVENOUS
  Administered 2015-01-31: 20 ug via INTRATHECAL
  Administered 2015-01-31: 30 ug via INTRAVENOUS

## 2015-01-31 MED ORDER — FENTANYL CITRATE 0.05 MG/ML IJ SOLN: 25.0000 ug | INTRAMUSCULAR | Status: DC | PRN

## 2015-01-31 MED ORDER — ONDANSETRON HCL 4 MG/2ML IJ SOLN: 4.0000 mg | Freq: Once | INTRAMUSCULAR | Status: DC | PRN

## 2015-01-31 MED ORDER — MIDAZOLAM HCL 2 MG/2ML IJ SOLN
INTRAMUSCULAR | Status: AC
  Filled 2015-01-31: qty 2

## 2015-01-31 MED ORDER — VANCOMYCIN HCL IN DEXTROSE 1-5 GM/200ML-% IV SOLN
1000.0000 mg | Freq: Two times a day (BID) | INTRAVENOUS | Status: AC
  Administered 2015-01-31: 1000 mg via INTRAVENOUS
  Filled 2015-01-31: qty 200

## 2015-01-31 MED ORDER — ONDANSETRON HCL 4 MG/2ML IJ SOLN: 4.0000 mg | Freq: Four times a day (QID) | INTRAMUSCULAR | Status: DC | PRN

## 2015-01-31 MED ORDER — ONDANSETRON HCL 4 MG/2ML IJ SOLN
INTRAMUSCULAR | Status: AC
  Filled 2015-01-31: qty 2

## 2015-01-31 MED ORDER — SUCCINYLCHOLINE CHLORIDE 20 MG/ML IJ SOLN
INTRAMUSCULAR | Status: AC
  Filled 2015-01-31: qty 1

## 2015-01-31 MED ORDER — SENNA 8.6 MG PO TABS
1.0000 | ORAL_TABLET | Freq: Two times a day (BID) | ORAL | Status: DC
  Administered 2015-01-31 – 2015-02-03 (×6): 8.6 mg via ORAL
  Filled 2015-01-31 (×6): qty 1

## 2015-01-31 MED ORDER — MIDAZOLAM HCL 2 MG/2ML IJ SOLN
1.0000 mg | INTRAMUSCULAR | Status: DC | PRN
Start: 2015-01-31 — End: 2015-01-31
  Administered 2015-01-31 (×2): 1 mg via INTRAVENOUS

## 2015-01-31 MED ORDER — FENTANYL CITRATE 0.05 MG/ML IJ SOLN
INTRAMUSCULAR | Status: AC
  Filled 2015-01-31: qty 2

## 2015-01-31 MED ORDER — SODIUM CHLORIDE 0.9 % IR SOLN
Status: DC | PRN
  Administered 2015-01-31: 1000 mL

## 2015-01-31 MED ORDER — PHENOL 1.4 % MT LIQD: 1.0000 | OROMUCOSAL | Status: DC | PRN

## 2015-01-31 MED ORDER — DOCUSATE SODIUM 100 MG PO CAPS
100.0000 mg | ORAL_CAPSULE | Freq: Two times a day (BID) | ORAL | Status: DC
  Administered 2015-01-31 – 2015-02-03 (×6): 100 mg via ORAL
  Filled 2015-01-31 (×6): qty 1

## 2015-01-31 MED ORDER — BUPIVACAINE-EPINEPHRINE (PF) 0.5% -1:200000 IJ SOLN
INTRAMUSCULAR | Status: AC
  Filled 2015-01-31: qty 60

## 2015-01-31 MED ORDER — PROPOFOL INFUSION 10 MG/ML OPTIME
INTRAVENOUS | Status: DC | PRN
  Administered 2015-01-31: 14:00:00 via INTRAVENOUS
  Administered 2015-01-31: 75 ug/kg/min via INTRAVENOUS

## 2015-01-31 MED ORDER — PHENYLEPHRINE HCL 10 MG/ML IJ SOLN
INTRAMUSCULAR | Status: DC | PRN
  Administered 2015-01-31: 100 ug via INTRAVENOUS

## 2015-01-31 MED ORDER — MENTHOL 3 MG MT LOZG: 1.0000 | LOZENGE | OROMUCOSAL | Status: DC | PRN

## 2015-01-31 MED ORDER — ARTIFICIAL TEARS OP OINT
TOPICAL_OINTMENT | OPHTHALMIC | Status: AC
  Filled 2015-01-31: qty 3.5

## 2015-01-31 MED ORDER — LIDOCAINE HCL (PF) 1 % IJ SOLN
INTRAMUSCULAR | Status: AC
  Filled 2015-01-31: qty 5

## 2015-01-31 MED ORDER — BUPIVACAINE IN DEXTROSE 0.75-8.25 % IT SOLN
INTRATHECAL | Status: DC | PRN
  Administered 2015-01-31: 13 mg via INTRATHECAL

## 2015-01-31 MED ORDER — LACTATED RINGERS IV SOLN
INTRAVENOUS | Status: DC
  Administered 2015-01-31: 1000 mL via INTRAVENOUS

## 2015-01-31 MED ORDER — ONDANSETRON HCL 4 MG PO TABS: 4.0000 mg | ORAL_TABLET | Freq: Four times a day (QID) | ORAL | Status: DC | PRN

## 2015-01-31 MED ORDER — VANCOMYCIN HCL IN DEXTROSE 1-5 GM/200ML-% IV SOLN
1000.0000 mg | INTRAVENOUS | Status: AC
  Administered 2015-01-31: 1000 mg via INTRAVENOUS

## 2015-01-31 MED ORDER — VANCOMYCIN HCL IN DEXTROSE 1-5 GM/200ML-% IV SOLN
INTRAVENOUS | Status: AC
  Filled 2015-01-31: qty 200

## 2015-01-31 MED ORDER — CHLORHEXIDINE GLUCONATE 4 % EX LIQD
60.0000 mL | Freq: Once | CUTANEOUS | Status: AC
  Administered 2015-01-31: 4 via TOPICAL
  Filled 2015-01-31 (×2): qty 118

## 2015-01-31 MED ORDER — SODIUM CHLORIDE 0.9 % IV SOLN
INTRAVENOUS | Status: DC | PRN
  Administered 2015-01-31: 13:00:00 via INTRAVENOUS

## 2015-01-31 SURGICAL SUPPLY — 61 items
BAG HAMPER (MISCELLANEOUS) ×2 IMPLANT
BIT DRILL 2.8X128 (BIT) ×2 IMPLANT
BLADE 10 SAFETY STRL DISP (BLADE) ×1 IMPLANT
BLADE HEX COATED 2.75 (ELECTRODE) ×2 IMPLANT
BLADE SAGITTAL 25.0X1.27X90 (BLADE) ×2 IMPLANT
BLADE SURG SZ10 CARB STEEL (BLADE) ×1 IMPLANT
BRUSH FEMORAL CANAL (MISCELLANEOUS) IMPLANT
CAPT HIP HEMI 1 ×1 IMPLANT
CHLORAPREP W/TINT 26ML (MISCELLANEOUS) ×2 IMPLANT
CLOTH BEACON ORANGE TIMEOUT ST (SAFETY) ×2 IMPLANT
COVER LIGHT HANDLE STERIS (MISCELLANEOUS) ×4 IMPLANT
COVER PROBE W GEL 5X96 (DRAPES) ×2 IMPLANT
DECANTER SPIKE VIAL GLASS SM (MISCELLANEOUS) ×4 IMPLANT
DRAPE HIP W/POCKET STRL (DRAPE) ×2 IMPLANT
DRESSING ALLEVYN LIFE SACRUM (GAUZE/BANDAGES/DRESSINGS) ×1 IMPLANT
DRSG MEPILEX BORDER 4X12 (GAUZE/BANDAGES/DRESSINGS) ×2 IMPLANT
ELECT REM PT RETURN 9FT ADLT (ELECTROSURGICAL) ×2
ELECTRODE REM PT RTRN 9FT ADLT (ELECTROSURGICAL) ×1 IMPLANT
EVACUATOR 3/16  PVC DRAIN (DRAIN) ×1
EVACUATOR 3/16 PVC DRAIN (DRAIN) IMPLANT
FACESHIELD LNG OPTICON STERILE (SAFETY) ×2 IMPLANT
GLOVE BIOGEL M 7.0 STRL (GLOVE) ×4 IMPLANT
GLOVE BIOGEL PI IND STRL 7.0 (GLOVE) IMPLANT
GLOVE BIOGEL PI IND STRL 7.5 (GLOVE) IMPLANT
GLOVE BIOGEL PI INDICATOR 7.0 (GLOVE) ×4
GLOVE BIOGEL PI INDICATOR 7.5 (GLOVE) ×1
GLOVE EXAM NITRILE MD LF STRL (GLOVE) ×1 IMPLANT
GLOVE SKINSENSE NS SZ8.0 LF (GLOVE) ×2
GLOVE SKINSENSE STRL SZ8.0 LF (GLOVE) ×2 IMPLANT
GLOVE SS N UNI LF 8.5 STRL (GLOVE) ×2 IMPLANT
GLOVE SURG SS PI 7.5 STRL IVOR (GLOVE) ×1 IMPLANT
GOWN STRL REUS W/TWL LRG LVL3 (GOWN DISPOSABLE) ×7 IMPLANT
GOWN STRL REUS W/TWL XL LVL3 (GOWN DISPOSABLE) ×2 IMPLANT
INST SET MAJOR BONE (KITS) ×2 IMPLANT
KIT BLADEGUARD II DBL (SET/KITS/TRAYS/PACK) ×2 IMPLANT
KIT ROOM TURNOVER APOR (KITS) ×2 IMPLANT
MANIFOLD NEPTUNE II (INSTRUMENTS) ×2 IMPLANT
MARKER SKIN DUAL TIP RULER LAB (MISCELLANEOUS) ×2 IMPLANT
NDL HYPO 21X1.5 SAFETY (NEEDLE) ×1 IMPLANT
NEEDLE HYPO 21X1.5 SAFETY (NEEDLE) ×2 IMPLANT
NS IRRIG 1000ML POUR BTL (IV SOLUTION) ×2 IMPLANT
PACK ARTHRO LIMB DRAPE STRL (MISCELLANEOUS) ×2 IMPLANT
PACK TOTAL JOINT (CUSTOM PROCEDURE TRAY) ×2 IMPLANT
PAD ARMBOARD 7.5X6 YLW CONV (MISCELLANEOUS) ×3 IMPLANT
PASSER SUT SWANSON 36MM LOOP (INSTRUMENTS) IMPLANT
PILLOW ABDUCTION HIP (SOFTGOODS) ×1 IMPLANT
SET BASIN LINEN APH (SET/KITS/TRAYS/PACK) ×2 IMPLANT
STAPLER VISISTAT 35W (STAPLE) ×2 IMPLANT
SUT BRALON NAB BRD #1 30IN (SUTURE) ×5 IMPLANT
SUT ETHIBOND 5 LR DA (SUTURE) ×4 IMPLANT
SUT MNCRL 0 VIOLET CTX 36 (SUTURE) ×1 IMPLANT
SUT MON AB 2-0 CT1 36 (SUTURE) ×1 IMPLANT
SUT MONOCRYL 0 CTX 36 (SUTURE) ×2
SUT VIC AB 1 CT1 27 (SUTURE) ×4
SUT VIC AB 1 CT1 27XBRD ANTBC (SUTURE) IMPLANT
SYR 30ML LL (SYRINGE) ×2 IMPLANT
SYR BULB IRRIGATION 50ML (SYRINGE) ×2 IMPLANT
TOWER CARTRIDGE SMART MIX (DISPOSABLE) IMPLANT
TRAY FOLEY CATH 16FR SILVER (SET/KITS/TRAYS/PACK) IMPLANT
WATER STERILE IRR 1000ML POUR (IV SOLUTION) ×4 IMPLANT
YANKAUER SUCT 12FT TUBE ARGYLE (SUCTIONS) ×2 IMPLANT

## 2015-01-31 NOTE — Anesthesia Preprocedure Evaluation (Addendum)
Anesthesia Evaluation  Patient identified by MRN, date of birth, ID band Patient awake    Reviewed: Allergy & Precautions, NPO status , Patient's Chart, lab work & pertinent test results  Airway Mallampati: III  TM Distance: <3 FB Neck ROM: Limited    Dental  (+) Teeth Intact, Partial Lower   Pulmonary former smoker,  breath sounds clear to auscultation        Cardiovascular hypertension, Pt. on medications - angina+ CAD and + Peripheral Vascular Disease (hx carotid stenosis) Rhythm:Regular Rate:Normal     Neuro/Psych Hx vertigo     GI/Hepatic GERD-  Medicated,  Endo/Other    Renal/GU      Musculoskeletal  (+) Arthritis -, Hx DDD lumbar & cervical   Abdominal   Peds  Hematology  (+) anemia ,   Anesthesia Other Findings 5000u heparin at 0600  Reproductive/Obstetrics                           Anesthesia Physical Anesthesia Plan  ASA: III  Anesthesia Plan: Spinal   Post-op Pain Management:    Induction:   Airway Management Planned: Simple Face Mask  Additional Equipment:   Intra-op Plan:   Post-operative Plan:   Informed Consent: I have reviewed the patients History and Physical, chart, labs and discussed the procedure including the risks, benefits and alternatives for the proposed anesthesia with the patient or authorized representative who has indicated his/her understanding and acceptance.     Plan Discussed with:   Anesthesia Plan Comments:         Anesthesia Quick Evaluation

## 2015-01-31 NOTE — Care Management Note (Addendum)
    Page 1 of 1   02/03/2015     2:34:11 PM CARE MANAGEMENT NOTE 02/03/2015  Patient:  Kerry Flynn, Kerry Flynn   Account Number:  [id number]  Date Initiated:  02/03/2015  Documentation initiated by:  Jolene Provost  Subjective/Objective Assessment:   Pt is from home, lives with husband, admitted for hip fx. Pt to have surgery today and PT eval pending. Plan for pt to need SNF rehab. CSW aware of anticipated needs and will continue to follow along with CM.     Action/Plan:   Anticipated DC Date:  02/03/2015   Anticipated DC Plan:  SKILLED NURSING FACILITY  In-house referral  Clinical Social Worker      DC Planning Services  CM consult      Choice offered to / List presented to:             Status of service:  Completed, signed off Medicare Important Message given?  YES (If response is "NO", the following Medicare IM given date fields will be blank) Date Medicare IM given:  01/31/2015 Medicare IM given by:  Jolene Provost Date Additional Medicare IM given:  02/03/2015 Additional Medicare IM given by:  Vladimir Creeks  Discharge Disposition:  [place]  Per UR Regulation:  Reviewed for med. necessity/level of care/duration of stay  If discussed at [place] of Stay Meetings, dates discussed:    Comments:  02/03/15 [place] RN/CM 01/31/2015 [place], Therapist, sports, MSN, [employer]

## 2015-01-31 NOTE — Op Note (Signed)
01/30/2015 - 01/31/2015  3:35 PM  PATIENT:  Kerry Flynn  81 y.o. female  PRE-OPERATIVE DIAGNOSIS:  LEFT FEMORAL NECK FRACTURE  POST-OPERATIVE DIAGNOSIS:  LEFT FEMORAL NECK FRACTURE  PROCEDURE:  Procedure(s): ARTHROPLASTY BIPOLAR HIP (Left)   Femoral neck fracture left hip  DEPUY SUMMIT BASIC FRACTURE STEM : 38F 48 HEAD + 5 NCK   SURGEON:  Surgeon(s) and Role:    * Carole Civil, MD - Primary  PHYSICIAN ASSISTANT:   ASSISTANTS: BETTY ASHLEY AND CATHERINE PAGE    ANESTHESIA:   spinal  EBL:  Total I/O In: 3100.3 [I.V.:2600.3; Blood:500] Out: 850 [Urine:600; Blood:250]  BLOOD ADMINISTERED:1 UNIT  CC PRBC  DRAINS: none   LOCAL MEDICATIONS USED:  MARCAINE     SPECIMEN:  No Specimen  DISPOSITION OF SPECIMEN:  N/A  COUNTS:  YES  TOURNIQUET:  * No tourniquets in log *  DICTATION: .Dragon Dictation  PLAN OF CARE: Admit to inpatient   PATIENT DISPOSITION:  PACU - hemodynamically stable.   Delay start of Pharmacological VTE agent (>24hrs) due to surgical blood loss or risk of bleeding: yes  Operative report dictation details  Patient was identified  Holding area by 2 identification Marcus. Left was marked for surgery chart review was completed  Patient taken to surgery had spinal anesthetic. She was placed in the lateral decubitus position with axillary roll. Left side was placed in the upper position  After sterile prep and drape and timeout we started the procedure  Incision directly lateral approach to the hip was made by making incision over the greater trochanter stenting proximal and distally with gentle clear posteriorly.  Subcutaneous tissue divided down to fascia fascial split in line with skin incision. Abductors identified and removed from the greater trochanter in continuity with the vastus lateralis. Lateral femoral circumflex artery was coagulated. We should note at this point that there was a partial rupture of the abductors from the greater  trochanter.  The capsule and muscle and vastus lateralis were all kept as one continuous flap. The hip was dislocated and examined.  There was arthritis in the hip moderate.  The femoral head was fractured and removed. Cutting guide was placed in femoral neck cut was made. Lesser trochanter was identified and the neck length was approximately 1.6 cm.  After thorough irrigation and removal of any bone fragments. Pilot hole was made in the proximal femur canal seeker box cutter followed. Rotating was performed up to a size 5. Trial reduction was done with a 48 head as measured from the femoral head as it was removed, +1.5 neck length. I was unsatisfied with stability and went up to a +5 neck length got excellent stability including in the sleeping position and restored leg lengths.  Trial components were removed actual components were placed in place repeat trial reduction confirmed stability and leg length  Wound was irrigated with copious amounts of saline. Marcaine and epinephrine injected around the hip capsule. Abductors were repaired by placing drill holes through the proximal femur with #5 Ethibond  and completing the vastus lateralis gluteus repair as one continuous flap using #1 Bralon in running fashion along with #5 Ethibond  Layered closure followed 30 mL of Marcaine injected beneath the fascia  Closing sutures included 0 Monocryl in 2 layers and the fascia was closed with #1 Bralon

## 2015-01-31 NOTE — Progress Notes (Signed)
[facility] PROGRESS NOTE  Kerry Flynn TIW:[id number] DOB: [birth year] DOA: 01/30/2015 PCP: Tula Nakayama, MD  Assessment/Plan: 1. Closed left hip fracture.  Secondary to fall/syncopal event. Surgery scheduled today. Pain management.  2. Syncope.  Presumably related to recent addition of lasix. Cardiac enzymes negative x3 EKG without acute changes. CT head no acute intracranial abnormalities. Neuro exam benign. Unable to check ortho static VS due to #1. Continue IV fluids. Continue to hold lasix.  3. Anemia: normocytic. Will obtain anemia panel and check FOBT. May need transfusion post op. Will monitor. 4. Hypertension, stable. Continue amlodipine, altace. Holding lasix. monitor 5. Osteoporosis. Chart review indicates recent PCP visit with complaints of worsening knee pain and reduced mobility and more kyphosis affecting gait. Needing walker previous 2 weeks.  6. CAD: no chest pain. Troponin neg x3. PCP note indicates complaints of  LE edema and increased exertional fatigue.   Code Status: full Family Communication: none present Disposition Plan: snf   Consultants:  Dr Aline Brochure ortho  Procedures:  none  Antibiotics:  none  HPI/Subjective: Reports fair pain management.   Objective: Filed Vitals:   01/31/15 0624  BP: 147/51  Pulse: 87  Temp: 99.3 F (37.4 C)  Resp: 20    Intake/Output Summary (Last 24 hours) at 01/31/15 0853 Last data filed at 01/31/15 2505  Gross per 24 hour  Intake      0 ml  Output   1200 ml  Net  -1200 ml   Filed Weights   01/30/15 1335 01/30/15 1755  Weight: 73.936 kg (163 lb) 73.9 kg (162 lb 14.7 oz)    Exam:   General:  Well nourished appears comfortable  Cardiovascular: RRR No MGR trace -1+LE edema PPP  Respiratory: normal effort somewhat shallow. BS distant faint wheeze no crackles  Abdomen: soft sluggish BS non-tender  Musculoskeletal: left leg slightly externally rotated and shorter than right decreased rom to left  hip due to pain.    Data Reviewed: Basic Metabolic Panel:  Recent Labs Lab 01/30/15 1400 01/31/15 0557  NA 136 136  K 4.5 3.9  CL 101 103  CO2 25 26  GLUCOSE 110* 99  BUN 28* 18  CREATININE 0.93 0.67  CALCIUM 9.5 9.0   Liver Function Tests:  Recent Labs Lab 01/31/15 0557  AST 24  ALT 14  ALKPHOS 55  BILITOT 0.6  PROT 5.9*  ALBUMIN 3.0*   No results for input(s): LIPASE, AMYLASE in the last 168 hours. No results for input(s): AMMONIA in the last 168 hours. CBC:  Recent Labs Lab 01/30/15 1400 01/31/15 0557  WBC 6.6 5.9  NEUTROABS 5.3  --   HGB 10.2* 9.5*  HCT 31.3* 28.8*  MCV 93.7 93.5  PLT 173 164   Cardiac Enzymes:  Recent Labs Lab 01/30/15 1400 01/30/15 1852 01/31/15 0044 01/31/15 0557  TROPONINI <0.03 <0.03 <0.03 <0.03   BNP (last 3 results)  Recent Labs  01/30/15 1400  BNP 119.0*    ProBNP (last 3 results) No results for input(s): PROBNP in the last 8760 hours.  CBG: No results for input(s): GLUCAP in the last 168 hours.  No results found for this or any previous visit (from the past 240 hour(s)).   Studies: Dg Chest 1 View  01/30/2015   CLINICAL DATA:  Left hip fracture.  Preoperative exam.  EXAM: CHEST  1 VIEW  COMPARISON:  PA and lateral chest 04/04/2014.  FINDINGS: The lungs are clear. Heart size is normal. No pneumothorax or pleural effusion. Postoperative change  right shoulder is noted.  IMPRESSION: No acute disease.   Electronically Signed   By: Inge Rise M.D.   On: 01/30/2015 14:49   Ct Head Wo Contrast  01/30/2015   CLINICAL DATA:  Fall.  EXAM: CT HEAD WITHOUT CONTRAST  TECHNIQUE: Contiguous axial images were obtained from the base of the skull through the vertex without intravenous contrast.  COMPARISON:  None  FINDINGS: Prominence of the sulci and ventricles identified consistent with brain atrophy. Diffuse low attenuation within the subcortical and periventricular white matter noted compatible with chronic microvascular  disease. There is no evidence for acute brain hemorrhage, infarct or intracranial mass. No abnormal extra-axial fluid collections. The calvarium appears intact. The mastoid air cells and paranasal sinuses are clear.  IMPRESSION: 1. No acute intracranial abnormalities. 2. Small vessel ischemic disease and brain atrophy.   Electronically Signed   By: Kerby Moors M.D.   On: 01/30/2015 16:47   Dg Hip Unilat With Pelvis 2-3 Views Left  01/30/2015   CLINICAL DATA:  Patient fell 3 days prior.  Progressive pain  EXAM: LEFT HIP (WITH PELVIS) 2-3 VIEWS  COMPARISON:  None.  FINDINGS: Frontal pelvis as well as frontal and lateral left hip images were obtained. There is a subcapital femoral neck fracture on the left with varus angulation of the fracture site and mild displacement of fracture fragments. No other fractures. No dislocation. There is moderate symmetric narrowing of both hip joints. Bones are osteoporotic.  IMPRESSION: Subcapital femoral neck fracture on the left with mild displacement of fracture fragments and varus angulation of the fracture site. No dislocation. Bones diffusely osteoporotic.   Electronically Signed   By: Lowella Grip M.D.   On: 01/30/2015 14:50    Scheduled Meds: . sodium chloride   Intravenous Once  . amLODipine  5 mg Oral Daily  . calcium-vitamin D  1 tablet Oral BID  . chlorhexidine  60 mL Topical Once  . naproxen  250 mg Oral BID WC  . ramipril  10 mg Oral Daily  . sodium chloride  3 mL Intravenous Q12H   Continuous Infusions: . sodium chloride 75 mL/hr at 01/31/15 0402    Active Problems:   Essential hypertension   CAD, NATIVE VESSEL   Syncope   Closed left hip fracture   Fall   Osteoporosis   Anemia    Time spent: 35 minutes    [facility] Pager [phone]. If 7PM-7AM, please contact night-coverage at [email or web address], password [facility] 01/31/2015, 8:53 AM  LOS: 1 day

## 2015-01-31 NOTE — Consult Note (Signed)
Reason for Consult: Left hip fracture Referring Physician: Dr. [name] is an 81 y.o. female.  HPI: 81 year old female fell Monday brought EMS out of the house they picked her up she seemed okay to try to walk around again she became nonambulatory, EMS came back to the hospital workup revealed left femoral neck fracture. She complains of severe nonradiating pain over the left hip groin and anterior thigh with no numbness or tingling in the lower extremity  Review of systems unremarkable  Patient walks with a walker she says she just started going down when she fell  Past Medical History  Diagnosis Date  . Hemorrhoids   . Intermittent vertigo   . Osteoporosis     knees  . Benign recurrent vertigo   . Vitamin D deficiency   . Head trauma     status post fall; upper and lower extremities   . GERD (gastroesophageal reflux disease)   . Diverticulosis   . Chronic constipation   . Osteoarthritis     of the neck  . Dermatitis     recurrent  . Hypertension   . Barrett's esophagus   . Infertility, female   . Peripheral edema     chronic  . Chronic knee pain   . Fatigue     chronic  . DDD (degenerative disc disease), lumbar   . DDD (degenerative disc disease), cervical     Past Surgical History  Procedure Laterality Date  . Knee arthroscopy  1999    rt  . Knee arthroscopy  1996    left   . Cholecystectomy, laparoscopic  2007    with stones  . Rotator cuff repair  May 14 2010  . Cataract extraction w/ intraocular lens implant      left, [facility]  . Eye surgery  2013    bilateral cataract extraction  . Colonoscopy  04/19/2012    Procedure: COLONOSCOPY;  Surgeon: Rogene Houston, MD;  Location: [facility];  Service: Endoscopy;  Laterality: N/A;  1200  . Tonsillectomy and adenoidectomy  1934  . Palate surgery  1988  . Dental implant   Oct 2015    Family History  Problem Relation Age of Onset  . Parkinsonism Sister   . Diabetes Mother   .  Hypertension Mother     cva  . CVA Mother   . Kidney disease Mother   . Heart failure Mother   . Colon cancer Neg Hx   . Cancer Father     larengeal  . Heart failure Father     CHF  . Heart disease Father   . Osteoporosis Sister     Social History:  reports that she has quit smoking. She does not have any smokeless tobacco history on file. She reports that she drinks about 0.6 oz of alcohol per week. She reports that she does not use illicit drugs.  Allergies:  Allergies  Allergen Reactions  . Clarithromycin   . Flagyl [Metronidazole]   . Penicillins     Medications: I have reviewed the patient's current medications.  Results for orders placed or performed during the hospital encounter of 01/30/15 (from the past 48 hour(s))  Basic metabolic panel     Status: Abnormal   Collection Time: 01/30/15  2:00 PM  Result Value Ref Range   Sodium 136 135 - 145 mmol/L   Potassium 4.5 3.5 - 5.1 mmol/L   Chloride 101 96 - 112 mmol/L   CO2 25 19 -  32 mmol/L   Glucose, Bld 110 (H) 70 - 99 mg/dL   BUN 28 (H) 6 - 23 mg/dL   Creatinine, Ser 0.93 0.50 - 1.10 mg/dL   Calcium 9.5 8.4 - 10.5 mg/dL   GFR calc non Af Amer 54 (L) >90 mL/min   GFR calc Af Amer 63 (L) >90 mL/min    Comment: (NOTE) The eGFR has been calculated using the CKD EPI equation. This calculation has not been validated in all clinical situations. eGFR's persistently <90 mL/min signify possible Chronic Kidney Disease.    Anion gap 10 5 - 15  CBC with Differential/Platelet     Status: Abnormal   Collection Time: 01/30/15  2:00 PM  Result Value Ref Range   WBC 6.6 4.0 - 10.5 K/uL   RBC 3.34 (L) 3.87 - 5.11 MIL/uL   Hemoglobin 10.2 (L) 12.0 - 15.0 g/dL   HCT 31.3 (L) 36.0 - 46.0 %   MCV 93.7 78.0 - 100.0 fL   MCH 30.5 26.0 - 34.0 pg   MCHC 32.6 30.0 - 36.0 g/dL   RDW 12.1 11.5 - 15.5 %   Platelets 173 150 - 400 K/uL   Neutrophils Relative % 80 (H) 43 - 77 %   Neutro Abs 5.3 1.7 - 7.7 K/uL   Lymphocytes Relative 11  (L) 12 - 46 %   Lymphs Abs 0.7 0.7 - 4.0 K/uL   Monocytes Relative 8 3 - 12 %   Monocytes Absolute 0.6 0.1 - 1.0 K/uL   Eosinophils Relative 1 0 - 5 %   Eosinophils Absolute 0.1 0.0 - 0.7 K/uL   Basophils Relative 0 0 - 1 %   Basophils Absolute 0.0 0.0 - 0.1 K/uL  Troponin I     Status: None   Collection Time: 01/30/15  2:00 PM  Result Value Ref Range   Troponin I <0.03 <0.031 ng/mL    Comment:        NO INDICATION OF MYOCARDIAL INJURY.   Brain natriuretic peptide     Status: Abnormal   Collection Time: 01/30/15  2:00 PM  Result Value Ref Range   B Natriuretic Peptide 119.0 (H) 0.0 - 100.0 pg/mL  TSH     Status: None   Collection Time: 01/30/15  2:00 PM  Result Value Ref Range   TSH 2.065 0.350 - 4.500 uIU/mL  Urinalysis, Routine w reflex microscopic     Status: Abnormal   Collection Time: 01/30/15  2:42 PM  Result Value Ref Range   Color, Urine YELLOW YELLOW   APPearance CLEAR CLEAR   Specific Gravity, Urine 1.015 1.005 - 1.030   pH 6.0 5.0 - 8.0   Glucose, UA NEGATIVE NEGATIVE mg/dL   Hgb urine dipstick NEGATIVE NEGATIVE   Bilirubin Urine NEGATIVE NEGATIVE   Ketones, ur NEGATIVE NEGATIVE mg/dL   Protein, ur NEGATIVE NEGATIVE mg/dL   Urobilinogen, UA 0.2 0.0 - 1.0 mg/dL   Nitrite NEGATIVE NEGATIVE   Leukocytes, UA MODERATE (A) NEGATIVE  Urine microscopic-add on     Status: None   Collection Time: 01/30/15  2:42 PM  Result Value Ref Range   Squamous Epithelial / LPF RARE RARE   WBC, UA 7-10 <3 WBC/hpf   Bacteria, UA RARE RARE  Troponin I     Status: None   Collection Time: 01/30/15  6:52 PM  Result Value Ref Range   Troponin I <0.03 <0.031 ng/mL    Comment:        NO INDICATION OF MYOCARDIAL INJURY.  Troponin I     Status: None   Collection Time: 01/31/15 12:44 AM  Result Value Ref Range   Troponin I <0.03 <0.031 ng/mL    Comment:        NO INDICATION OF MYOCARDIAL INJURY.   Troponin I     Status: None   Collection Time: 01/31/15  5:57 AM  Result Value  Ref Range   Troponin I <0.03 <0.031 ng/mL    Comment:        NO INDICATION OF MYOCARDIAL INJURY.   Comprehensive metabolic panel     Status: Abnormal   Collection Time: 01/31/15  5:57 AM  Result Value Ref Range   Sodium 136 135 - 145 mmol/L   Potassium 3.9 3.5 - 5.1 mmol/L   Chloride 103 96 - 112 mmol/L   CO2 26 19 - 32 mmol/L   Glucose, Bld 99 70 - 99 mg/dL   BUN 18 6 - 23 mg/dL   Creatinine, Ser 0.67 0.50 - 1.10 mg/dL   Calcium 9.0 8.4 - 10.5 mg/dL   Total Protein 5.9 (L) 6.0 - 8.3 g/dL   Albumin 3.0 (L) 3.5 - 5.2 g/dL   AST 24 0 - 37 U/L   ALT 14 0 - 35 U/L   Alkaline Phosphatase 55 39 - 117 U/L   Total Bilirubin 0.6 0.3 - 1.2 mg/dL   GFR calc non Af Amer 78 (L) >90 mL/min   GFR calc Af Amer >90 >90 mL/min    Comment: (NOTE) The eGFR has been calculated using the CKD EPI equation. This calculation has not been validated in all clinical situations. eGFR's persistently <90 mL/min signify possible Chronic Kidney Disease.    Anion gap 7 5 - 15  CBC     Status: Abnormal   Collection Time: 01/31/15  5:57 AM  Result Value Ref Range   WBC 5.9 4.0 - 10.5 K/uL   RBC 3.08 (L) 3.87 - 5.11 MIL/uL   Hemoglobin 9.5 (L) 12.0 - 15.0 g/dL   HCT 28.8 (L) 36.0 - 46.0 %   MCV 93.5 78.0 - 100.0 fL   MCH 30.8 26.0 - 34.0 pg   MCHC 33.0 30.0 - 36.0 g/dL   RDW 12.1 11.5 - 15.5 %   Platelets 164 150 - 400 K/uL  Reticulocytes     Status: Abnormal   Collection Time: 01/31/15  9:04 AM  Result Value Ref Range   Retic Ct Pct 1.0 0.4 - 3.1 %   RBC. 3.15 (L) 3.87 - 5.11 MIL/uL   Retic Count, Manual 31.5 19.0 - 186.0 K/uL  Type and screen     Status: None (Preliminary result)   Collection Time: 01/31/15  9:04 AM  Result Value Ref Range   ABO/RH(D) AB POS    Antibody Screen NEG    Sample Expiration 02/03/2015    Unit Number [id number]    Blood Component Type RED CELLS,LR    Unit division 00    Status of Unit ISSUED    Transfusion Status OK TO TRANSFUSE    Crossmatch Result Compatible     Unit Number [id number]    Blood Component Type RED CELLS,LR    Unit division 00    Status of Unit ALLOCATED    Transfusion Status OK TO TRANSFUSE    Crossmatch Result Compatible    Unit Number [id number]    Blood Component Type RED CELLS,LR    Unit division 00    Status of Unit ALLOCATED    Transfusion Status OK  TO TRANSFUSE    Crossmatch Result Compatible   Prepare RBC     Status: None   Collection Time: 01/31/15  9:04 AM  Result Value Ref Range   Order Confirmation ORDER PROCESSED BY BLOOD BANK   ABO/Rh     Status: None   Collection Time: 01/31/15  9:13 AM  Result Value Ref Range   ABO/RH(D) AB POS     Dg Chest 1 View  01/30/2015   CLINICAL DATA:  Left hip fracture.  Preoperative exam.  EXAM: CHEST  1 VIEW  COMPARISON:  PA and lateral chest 04/04/2014.  FINDINGS: The lungs are clear. Heart size is normal. No pneumothorax or pleural effusion. Postoperative change right shoulder is noted.  IMPRESSION: No acute disease.   Electronically Signed   By: Inge Rise M.D.   On: 01/30/2015 14:49   Ct Head Wo Contrast  01/30/2015   CLINICAL DATA:  Fall.  EXAM: CT HEAD WITHOUT CONTRAST  TECHNIQUE: Contiguous axial images were obtained from the base of the skull through the vertex without intravenous contrast.  COMPARISON:  None  FINDINGS: Prominence of the sulci and ventricles identified consistent with brain atrophy. Diffuse low attenuation within the subcortical and periventricular white matter noted compatible with chronic microvascular disease. There is no evidence for acute brain hemorrhage, infarct or intracranial mass. No abnormal extra-axial fluid collections. The calvarium appears intact. The mastoid air cells and paranasal sinuses are clear.  IMPRESSION: 1. No acute intracranial abnormalities. 2. Small vessel ischemic disease and brain atrophy.   Electronically Signed   By: Kerby Moors M.D.   On: 01/30/2015 16:47   Dg Hip Unilat With Pelvis 2-3 Views Left  01/30/2015    CLINICAL DATA:  Patient fell 3 days prior.  Progressive pain  EXAM: LEFT HIP (WITH PELVIS) 2-3 VIEWS  COMPARISON:  None.  FINDINGS: Frontal pelvis as well as frontal and lateral left hip images were obtained. There is a subcapital femoral neck fracture on the left with varus angulation of the fracture site and mild displacement of fracture fragments. No other fractures. No dislocation. There is moderate symmetric narrowing of both hip joints. Bones are osteoporotic.  IMPRESSION: Subcapital femoral neck fracture on the left with mild displacement of fracture fragments and varus angulation of the fracture site. No dislocation. Bones diffusely osteoporotic.   Electronically Signed   By: Lowella Grip M.D.   On: 01/30/2015 14:50    ROS Blood pressure 148/66, pulse 86, temperature 98.9 F (37.2 C), temperature source Oral, resp. rate 14, height $RemoveBe'5\' 5"'pYslMxgLN$  (1.651 m), weight 162 lb 14.7 oz (73.9 kg), SpO2 99 %. Physical Exam The patient is lying flat in bed. Appears to have no head trauma she is somewhat somnolent. She is oriented to person and place. She cannot ambulate. Her upper extremities show no fracture subluxation atrophy tremor mild Heberden and Bouchard's nodes in the hands no major contractures.  The left leg is extremely tender near the hip joint and thigh. She is noted to have mild swelling there no major deformity of the limb and no shortening.  The right lower extremity is normal  Abdomen is soft nontender nondistended  Clavicles nontender  Neck supple. No lymphadenopathy in the clavicular regions or the groin regions.   Assessment/Plan: Left femoral neck fracture  I offered the patient the best treatment at this particular injury which is a partial hip replacement in her age group and activity level. Alternative treatments of nonoperative care would not be beneficial to her long-term  health  She understands risks and benefits of the procedure agrees to proceed with a left hip bipolar  replacement.  Arther Abbott 01/31/2015, 1:07 PM

## 2015-01-31 NOTE — Progress Notes (Signed)
Dr Aline Brochure returned call hgb 11.4 and hct 34.9 results given. No new orders given.

## 2015-01-31 NOTE — Anesthesia Procedure Notes (Addendum)
Spinal Patient location during procedure: OR Start time: 01/31/2015 1:48 PM Staffing Resident/CRNA: ADAMS, AMY A Preanesthetic Checklist Completed: patient identified, site marked, surgical consent, pre-op evaluation, timeout performed, IV checked, risks and benefits discussed and monitors and equipment checked Spinal Block Patient position: left lateral decubitus Prep: Betadine Patient monitoring: heart rate, cardiac monitor, continuous pulse ox and blood pressure Approach: left paramedian Location: L3-4 Injection technique: single-shot Needle Needle type: Spinocan  Needle gauge: 22 G Needle length: 9 cm Assessment Sensory level: T8 Additional Notes  ATTEMPTS:2 TRAY [id number] TRAY EXPIRATION DATE:02/2016  Date/Time: 01/31/2015 1:20 PM Performed by: Andree Elk, AMY A Pre-anesthesia Checklist: Patient identified, Timeout performed, Emergency Drugs available, Suction available and Patient being monitored Oxygen Delivery Method: Simple face mask

## 2015-01-31 NOTE — Brief Op Note (Addendum)
01/30/2015 - 01/31/2015  3:35 PM  PATIENT:  Kerry Flynn  81 y.o. female  PRE-OPERATIVE DIAGNOSIS:  LEFT FEMORAL NECK FRACTURE  POST-OPERATIVE DIAGNOSIS:  LEFT FEMORAL NECK FRACTURE  PROCEDURE:  Procedure(s): ARTHROPLASTY BIPOLAR HIP (Left)   Femoral neck fracture left hip  DEPUY SUMMIT BASIC FRACTURE STEM : 72F 48 HEAD + 5 NCK   SURGEON:  Surgeon(s) and Role:    * Carole Civil, MD - Primary  PHYSICIAN ASSISTANT:   ASSISTANTS: BETTY ASHLEY AND CATHERINE PAGE    ANESTHESIA:   spinal  EBL:  Total I/O In: 3100.3 [I.V.:2600.3; Blood:500] Out: 850 [Urine:600; Blood:250]  BLOOD ADMINISTERED:1 UNIT  CC PRBC  DRAINS: none   LOCAL MEDICATIONS USED:  MARCAINE     SPECIMEN:  No Specimen  DISPOSITION OF SPECIMEN:  N/A  COUNTS:  YES  TOURNIQUET:  * No tourniquets in log *  DICTATION: .Dragon Dictation  PLAN OF CARE: Admit to inpatient   PATIENT DISPOSITION:  PACU - hemodynamically stable.   Delay start of Pharmacological VTE agent (>24hrs) due to surgical blood loss or risk of bleeding: yes

## 2015-01-31 NOTE — Transfer of Care (Signed)
Immediate Anesthesia Transfer of Care Note  Patient: Kerry Flynn  Procedure(s) Performed: Procedure(s): ARTHROPLASTY BIPOLAR HIP (Left)  Patient Location: PACU  Anesthesia Type:Spinal  Level of Consciousness: awake and patient cooperative  Airway & Oxygen Therapy: Patient Spontanous Breathing and Patient connected to nasal cannula oxygen  Post-op Assessment: Report given to RN and Post -op Vital signs reviewed and stable  Post vital signs: Reviewed and stable  Last Vitals:  Filed Vitals:   01/31/15 1315  BP: 152/64  Pulse:   Temp:   Resp: 16    Complications: No apparent anesthesia complications

## 2015-01-31 NOTE — Anesthesia Postprocedure Evaluation (Signed)
  Anesthesia Post-op Note  Patient: Kerry Flynn  Procedure(s) Performed: Procedure(s): ARTHROPLASTY BIPOLAR HIP (Left)  Patient Location: PACU  Anesthesia Type:Spinal  Level of Consciousness: awake, alert , oriented and patient cooperative  Airway and Oxygen Therapy: Patient Spontanous Breathing and Patient connected to nasal cannula oxygen  Post-op Pain: none  Post-op Assessment: Post-op Vital signs reviewed, Patient's Cardiovascular Status Stable, Respiratory Function Stable, Patent Airway, No signs of Nausea or vomiting and Pain level controlled  Post-op Vital Signs: Reviewed and stable  Last Vitals:  Filed Vitals:   01/31/15 1600  BP: 160/70  Pulse: 104  Temp:   Resp: 4    Complications: No apparent anesthesia complications

## 2015-01-31 NOTE — Progress Notes (Signed)
hgb 11.4 and hct 34.9 results called and message left for Dr Patsey Berthold and Dr Aline Brochure.

## 2015-02-01 DIAGNOSIS — D62 Acute posthemorrhagic anemia: Secondary | ICD-10-CM

## 2015-02-01 LAB — BASIC METABOLIC PANEL
ANION GAP: 7 (ref 5–15)
BUN: 17 mg/dL (ref 6–23)
CO2: 26 mmol/L (ref 19–32)
Calcium: 8.9 mg/dL (ref 8.4–10.5)
Chloride: 101 mmol/L (ref 96–112)
Creatinine, Ser: 0.73 mg/dL (ref 0.50–1.10)
GFR calc Af Amer: 88 mL/min — ABNORMAL LOW (ref >90)
GFR calc non Af Amer: 76 mL/min — ABNORMAL LOW (ref >90)
Glucose, Bld: 115 mg/dL — ABNORMAL HIGH (ref 70–99)
POTASSIUM: 3.8 mmol/L (ref 3.5–5.1)
Sodium: 134 mmol/L — ABNORMAL LOW (ref 135–145)

## 2015-02-01 LAB — CBC
HEMATOCRIT: 27.3 % — AB (ref 36.0–46.0)
Hemoglobin: 9.2 g/dL — ABNORMAL LOW (ref 12.0–15.0)
MCH: 30.9 pg (ref 26.0–34.0)
MCHC: 33.7 g/dL (ref 30.0–36.0)
MCV: 91.6 fL (ref 78.0–100.0)
Platelets: 147 10*3/uL — ABNORMAL LOW (ref 150–400)
RBC: 2.98 MIL/uL — ABNORMAL LOW (ref 3.87–5.11)
RDW: 12.4 % (ref 11.5–15.5)
WBC: 7.9 10*3/uL (ref 4.0–10.5)

## 2015-02-01 LAB — URINE CULTURE
CULTURE: NO GROWTH
Colony Count: NO GROWTH

## 2015-02-01 MED ORDER — MECLIZINE HCL 12.5 MG PO TABS
12.5000 mg | ORAL_TABLET | Freq: Two times a day (BID) | ORAL | Status: DC | PRN
  Administered 2015-02-01: 12.5 mg via ORAL
  Filled 2015-02-01: qty 1

## 2015-02-01 NOTE — Progress Notes (Signed)
Subjective: 1 Day Post-Op Procedure(s) (LRB): ARTHROPLASTY BIPOLAR HIP (Left)   Objective: Vital signs in last 24 hours: Temp:  [98.2 F (36.8 C)-99.7 F (37.6 C)] 99.6 F (37.6 C) (02/06 0642) Pulse Rate:  [85-109] 85 (02/06 0642) Resp:  [4-21] 14 (02/06 0642) BP: (130-181)/(44-75) 147/59 mmHg (02/06 0642) SpO2:  [92 %-100 %] 92 % (02/06 0642)  Intake/Output from previous day: 02/05 0701 - 02/06 0700 In: 3540.3 [P.O.:240; I.V.:2600.3; Blood:500; IV Piggyback:200] Out: 1050 [Urine:800; Blood:250] Intake/Output this shift:     Recent Labs  01/30/15 1400 01/31/15 0557 01/31/15 1540 02/01/15 0557  HGB 10.2* 9.5* 11.4* 9.2*    Recent Labs  01/31/15 1540 02/01/15 0557  WBC 9.9 7.9  RBC 3.72* 2.98*  HCT 34.9* 27.3*  PLT 176 147*    Recent Labs  01/31/15 0557 02/01/15 0557  NA 136 134*  K 3.9 3.8  CL 103 101  CO2 26 26  BUN 18 17  CREATININE 0.67 0.73  GLUCOSE 99 115*  CALCIUM 9.0 8.9   No results for input(s): LABPT, INR in the last 72 hours.    Assessment/Plan: 1 Day Post-Op Procedure(s) (LRB): ARTHROPLASTY BIPOLAR HIP (Left) Up with therapy  Arther Abbott 02/01/2015, 8:00 AM

## 2015-02-01 NOTE — Addendum Note (Signed)
Addendum  created 02/01/15 2527 by Mickel Baas, CRNA   Modules edited: Notes Section   Notes Section:  File: [id number]

## 2015-02-01 NOTE — Progress Notes (Signed)
  PROGRESS NOTE  Kerry Flynn JHE:[id number] DOB: 1927/09/25 DOA: 01/30/2015 PCP: Tula Nakayama, MD  Summary: 81 year old woman who fell at home 2/3, may have passed out, did hit her head at that time. Ambulated with a walker since without focal neurologic abnormalities. 2/4 she had difficulty getting up and so called EMS. Workup revealed a closed left hip fracture.  Assessment/Plan: 1. Closed left hip fracture. Status post operative intervention 2/5. 2. Acute blood loss anemia (perioperative), asymptomatic 3. Syncope prior to admission, likely related to Lasix. No evidence of ACS or neurologic event. Suspect dehydration. No further evaluation suggested. 4. Hypertension. 5. History of coronary artery disease. Previous workup in the last several years is demonstrated no evidence of ischemia. Appears to be asymptomatic.   Continue postoperative management per orthopedics  Physical therapy  Check CBC in AM  Code Status: full code DVT prophylaxis: Enoxaparin Family Communication: none present Disposition Plan: Pending therapy recommendations  Murray Hodgkins, MD  [facility]  Pager [phone] If 7PM-7AM, please contact night-coverage at [email or web address], password [facility] 02/01/2015, 12:03 PM  LOS: 2 days   Consultants:  Orthopedics   Procedures:  2/5 left hip arthroplasty  Antibiotics:    HPI/Subjective: Doing ok, joints stiff. No CP or SOB. No abd pain.  Objective: Filed Vitals:   01/31/15 1830 01/31/15 1930 01/31/15 2202 02/01/15 0642  BP: 135/70 130/75 145/53 147/59  Pulse: 90 98 91 85  Temp: 98.2 F (36.8 C) 98.3 F (36.8 C) 99.7 F (37.6 C) 99.6 F (37.6 C)  TempSrc: Oral Oral Oral Oral  Resp: 18 18 18 14   Height:      Weight:      SpO2: 100% 100% 96% 92%    Intake/Output Summary (Last 24 hours) at 02/01/15 1203 Last data filed at 02/01/15 1002  Gross per 24 hour  Intake   1468 ml  Output    650 ml  Net    818 ml     Filed Weights   01/30/15  1335 01/30/15 1755  Weight: 73.936 kg (163 lb) 73.9 kg (162 lb 14.7 oz)    Exam:     Afebrile, vital signs stable. No hypoxia. General:  Appears calm and comfortable Cardiovascular: RRR, no m/r/g. No LE edema. Telemetry: SR, no arrhythmias  Respiratory: CTA bilaterally, no w/r/r. Normal respiratory effort. Musculoskeletal: moves both legs to command Psychiatric: grossly normal mood and affect, speech fluent and appropriate  Data Reviewed:  Hemoglobin lower, 9.2. Basic metabolic panel unremarkable.  Scheduled Meds: . amLODipine  5 mg Oral Daily  . calcium-vitamin D  1 tablet Oral BID  . docusate sodium  100 mg Oral BID  . enoxaparin (LOVENOX) injection  30 mg Subcutaneous Q24H  . polyethylene glycol  17 g Oral Daily  . ramipril  10 mg Oral Daily  . senna  1 tablet Oral BID  . sodium chloride  3 mL Intravenous Q12H   Continuous Infusions:   Active Problems:   Essential hypertension   CAD, NATIVE VESSEL   Syncope   Closed left hip fracture   Fall   Osteoporosis   Anemia   Femoral neck fracture   Faintness   Time spent 15 minutes

## 2015-02-01 NOTE — Anesthesia Postprocedure Evaluation (Signed)
  Anesthesia Post-op Note  Patient: Kerry Flynn  Procedure(s) Performed: Procedure(s): ARTHROPLASTY BIPOLAR HIP (Left)  Patient Location: Room 309  Anesthesia Type:Spinal  Level of Consciousness: awake, oriented and patient cooperative  Airway and Oxygen Therapy: Patient Spontanous Breathing  Post-op Pain: mild  Post-op Assessment: Post-op Vital signs reviewed, Patient's Cardiovascular Status Stable, Respiratory Function Stable, Patent Airway, No signs of Nausea or vomiting and Pain level controlled  Post-op Vital Signs: Reviewed and stable  Last Vitals:  Filed Vitals:   02/01/15 0642  BP: 147/59  Pulse: 85  Temp: 37.6 C  Resp: 14    Complications: No apparent anesthesia complications

## 2015-02-01 NOTE — Evaluation (Signed)
Physical Therapy Evaluation Patient Details Name: Kerry Flynn MRN: [id number] DOB: 08-29-1927 Today's Date: 02/01/2015   History of Present Illness  81 year old woman who fell at home 2/3, may have passed out, did hit her head at that time. Ambulated with a walker since without focal neurologic abnormalities. 2/4 she had difficulty getting up and so called EMS. Workup revealed a closed left hip fracture. Patient seen s/p surgery of Lt hip.   Clinical Impression  Therapist arrived to patient's room with patient complaining of feeling ike she was going to fall and patient being very anxious. Patient was gently reminded she was in bed and would not fall relieving her anxiety. Patient complains of Lt hip pain 10/10 and abdominal pain for which she attributed to a history of poor response to anaesthesia. Patient states feeling dizzy as well. Patient was un able to follow multi step directions with all movements extremely slow and patient unable to actively move Rt and Lt hips. Patient required max assist to perform supine to sit and continued max assist to maintain sitting. Patient will benefit from SNF for strengthening and gait training to increase functional mobility. PT will continue to see patient during current hospital stay.     Follow Up Recommendations SNF    Equipment Recommendations  None recommended by PT    Recommendations for Other Services       Precautions / Restrictions Precautions Precautions: Fall Precaution Comments: lt hip fracture fixation.  Restrictions Weight Bearing Restrictions: Yes LLE Weight Bearing: Weight bearing as tolerated      Mobility  Bed Mobility Overal bed mobility: +2 for physical assistance             General bed mobility comments: max assist to obtain seated position.   Transfers                 General transfer comment: unable to assess due to weakness and inability to maintain seated position withou max  assistance  Ambulation/Gait                Stairs            Wheelchair Mobility    Modified Rankin (Stroke Patients Only)       Balance Overall balance assessment: Needs assistance;History of Falls Sitting-balance support: Bilateral upper extremity supported Sitting balance-Leahy Scale: Zero Sitting balance - Comments: Therapist provided max assistance to maintain sitting patient notes pain and difficulty maitaining eyes open possibley related to symptoms from [place] use.  Postural control: Posterior lean;Left lateral lean                                   Pertinent Vitals/Pain Pain Assessment: 0-10 Pain Score: 10-Worst pain ever Pain Location: Lt hipp and bilateral LEs Pain Descriptors / Indicators: Aching;Sore Pain Intervention(s): Repositioned;Utilized relaxation techniques    Home Living Family/patient expects to be discharged to:: Private residence Living Arrangements: Spouse/significant other Available Help at Discharge: Family (45yo husband) Type of Home: House (split level home with ramp and elevator for navigation of stairs) Home Access: Stairs to enter   Entrance Stairs-Number of Steps: patient unsure, no family present Home Layout: Two level Home Equipment: Environmental consultant - 2 wheels;Shower seat;Grab bars - tub/shower      Prior Function Level of Independence: Independent with assistive device(s)         Comments: history of multiple falls     Hand Dominance  Extremity/Trunk Assessment               Lower Extremity Assessment: Generalized weakness      Cervical / Trunk Assessment: Kyphotic  Communication   Communication: No difficulties  Cognition Arousal/Alertness: Lethargic;Suspect due to medications Behavior During Therapy: Flat affect Overall Cognitive Status: History of cognitive impairments - at baseline Area of Impairment: Problem solving;Awareness     Memory: Decreased recall of precautions      Awareness: Anticipatory Problem Solving: Slow processing;Decreased initiation;Difficulty sequencing;Requires verbal cues;Requires tactile cues General Comments: Patient unable to follow more than 1 step directions with overall difficulty initiating movement.     General Comments      Exercises Total Joint Exercises Ankle Circles/Pumps: AROM;20 reps General Exercises - Lower Extremity Hip ABduction/ADduction: AROM;5 reps (through <5 degrees of motion secodnary to weakness. )      Assessment/Plan    PT Assessment Patient needs continued PT services  PT Diagnosis Generalized weakness   PT Problem List Decreased strength;Decreased cognition;Decreased range of motion;Decreased activity tolerance;Decreased balance;Decreased mobility;Decreased coordination  PT Treatment Interventions Functional mobility training;Therapeutic exercise;Gait training   PT Goals (Current goals can be found in the Care Plan section) Acute Rehab PT Goals Patient Stated Goal: to sit at edge of bed with min assist PT Goal Formulation: With patient Time For Goal Achievement: 02/05/15 Potential to Achieve Goals: Fair    Frequency 7X/week   Barriers to discharge Decreased caregiver support;Inaccessible home environment patient will require maxassistance at hoem for which her husband is most likely unable to provide 24 hours daliy.     Co-evaluation               End of Session Equipment Utilized During Treatment: Gait belt Activity Tolerance: Patient limited by fatigue;Patient limited by pain Patient left: in bed;with call bell/phone within reach;with bed alarm set           Time: 1540-1600 PT Time Calculation (min) (ACUTE ONLY): 20 min   Charges:   PT Evaluation $Initial PT Evaluation Tier I: 1 Procedure PT Treatments $Therapeutic Activity: 8-22 mins   PT G Codes:        Harsha Yusko R 2015/02/19, 5:12 PM

## 2015-02-02 DIAGNOSIS — I25118 Atherosclerotic heart disease of native coronary artery with other forms of angina pectoris: Secondary | ICD-10-CM

## 2015-02-02 LAB — BASIC METABOLIC PANEL
ANION GAP: 7 (ref 5–15)
BUN: 17 mg/dL (ref 6–23)
CALCIUM: 9 mg/dL (ref 8.4–10.5)
CO2: 28 mmol/L (ref 19–32)
Chloride: 97 mmol/L (ref 96–112)
Creatinine, Ser: 0.76 mg/dL (ref 0.50–1.10)
GFR, EST AFRICAN AMERICAN: 87 mL/min — AB (ref >90)
GFR, EST NON AFRICAN AMERICAN: 75 mL/min — AB (ref >90)
GLUCOSE: 113 mg/dL — AB (ref 70–99)
Potassium: 4 mmol/L (ref 3.5–5.1)
SODIUM: 132 mmol/L — AB (ref 135–145)

## 2015-02-02 LAB — CBC
HCT: 29.7 % — ABNORMAL LOW (ref 36.0–46.0)
HEMOGLOBIN: 9.8 g/dL — AB (ref 12.0–15.0)
MCH: 30.4 pg (ref 26.0–34.0)
MCHC: 33 g/dL (ref 30.0–36.0)
MCV: 92.2 fL (ref 78.0–100.0)
PLATELETS: 155 10*3/uL (ref 150–400)
RBC: 3.22 MIL/uL — ABNORMAL LOW (ref 3.87–5.11)
RDW: 12.3 % (ref 11.5–15.5)
WBC: 8.9 10*3/uL (ref 4.0–10.5)

## 2015-02-02 MED ORDER — SODIUM CHLORIDE 0.9 % IV SOLN
INTRAVENOUS | Status: AC
  Administered 2015-02-02: 13:00:00 via INTRAVENOUS

## 2015-02-02 NOTE — Progress Notes (Signed)
  PROGRESS NOTE  BRIANTE LOVEALL JZP:[id number] DOB: 1927/08/01 DOA: 01/30/2015 PCP: Tula Nakayama, MD  Summary: 81 year old woman who fell at home 2/3, may have passed out, did hit her head at that time. Ambulated with a walker since without focal neurologic abnormalities. 2/4 she had difficulty getting up and so called EMS. Workup revealed a closed left hip fracture.  Assessment/Plan: 1. Closed left hip fracture. Status post operative intervention 2/5. Remains stable. 2. Acute blood loss anemia (perioperative), asymptomatic, stable 3. Syncope prior to admission, likely related to Lasix. No evidence of ACS or neurologic event. Suspect dehydration. No further evaluation suggested. 4. Modest hyponatremia likely secondary to poor oral intake. Plan IV fluids today. 5. Hypertension.stable. 6. History of coronary artery disease. Previous workup in the last several years is demonstrated no evidence of ischemia. Asymptomatic.    Continue postoperative management per orthopedics  Physical therapy recommended skilled nursing facility  Check CBC, basic metabolic panel in AM  Likely transfer to skilled nursing facility 2/8  Code Status: full code DVT prophylaxis: Enoxaparin Family Communication: none present Disposition Plan: Pending therapy recommendations  Murray Hodgkins, MD  [facility]  Pager [phone] If 7PM-7AM, please contact night-coverage at [email or web address], password [facility] 02/02/2015, 11:03 AM  LOS: 3 days   Consultants:  Orthopedics   Procedures:  2/5 left hip arthroplasty  Antibiotics:    HPI/Subjective: Dizzy last night; muscle spasms. Pain controlled today. Has chronic intermittent vertigo, takes meclizine. No CP or SOB.  Objective: Filed Vitals:   02/01/15 2138 02/02/15 0000 02/02/15 0400 02/02/15 0539  BP: 140/45   136/56  Pulse: 89   76  Temp: 100.9 F (38.3 C)   98.8 F (37.1 C)  TempSrc: Oral   Oral  Resp: 18 16 16 18   Height:      Weight:        SpO2: 97% 96% 97% 96%    Intake/Output Summary (Last 24 hours) at 02/02/15 1103 Last data filed at 02/02/15 1005  Gross per 24 hour  Intake    243 ml  Output      0 ml  Net    243 ml     Filed Weights   01/30/15 1335 01/30/15 1755  Weight: 73.936 kg (163 lb) 73.9 kg (162 lb 14.7 oz)    Exam:     Afebrile, vital signs stable. No hypoxia. General:  Appears calm and comfortable Cardiovascular: RRR, no m/r/g.  Respiratory: CTA bilaterally, no w/r/r. Normal respiratory effort. Psychiatric: grossly normal mood and affect, speech fluent and appropriate  Data Reviewed:  Hemoglobin stable, 9.8. Modest hyponatremia.  Scheduled Meds: . amLODipine  5 mg Oral Daily  . calcium-vitamin D  1 tablet Oral BID  . docusate sodium  100 mg Oral BID  . enoxaparin (LOVENOX) injection  30 mg Subcutaneous Q24H  . polyethylene glycol  17 g Oral Daily  . ramipril  10 mg Oral Daily  . senna  1 tablet Oral BID  . sodium chloride  3 mL Intravenous Q12H   Continuous Infusions:   Active Problems:   Essential hypertension   CAD, NATIVE VESSEL   Syncope   Closed left hip fracture   Fall   Osteoporosis   Anemia   Femoral neck fracture   Faintness   Time spent 20 minutes

## 2015-02-02 NOTE — Progress Notes (Signed)
Physical Therapy Treatment Patient Details Name: Kerry Flynn MRN: [id number] DOB: 10-Jun-1927 Today's Date: 02/02/2015    History of Present Illness      PT Comments    Pt sitting in chair upon therapist interest, reports pain minimal in hip and stated pain in thoracic region from her bad posture in seated position.  Pt drowsy today believed to be due to medication but able to respond and complete exercises appropriately with slow response.  LE strengthening exercises complete in chair with AROM or AAROM for heel slides.  Chair mobility activities complete to improve scooting forward and backward as well as Lt to Rt.  Max assistance required for sit to stand with cueing for hand placement prior to standing and then reaching towards RW, multiple cueing to improve posture.  Pt able to stand for 2 minutes prior limited by fatigue, cueing for hand placement prior sitting.  No reports of increased pain through session, just tired.  Pt left in chair with call bell within reach, chair alarm set and husband in room.  Follow Up Recommendations        Equipment Recommendations       Recommendations for Other Services       Precautions / Restrictions Precautions Precautions: Fall Precaution Comments: lt hip fracture fixation.  Restrictions Weight Bearing Restrictions: Yes LLE Weight Bearing: Weight bearing as tolerated    Mobility  Bed Mobility                  Transfers Overall transfer level: Needs assistance Equipment used: Rolling walker (2 wheeled) Transfers: Sit to/from Stand           General transfer comment: Max assistance with sit to stand and cueing for hand and foot placement  Ambulation/Gait                 Stairs            Wheelchair Mobility    Modified Rankin (Stroke Patients Only)       Balance                                    Cognition Arousal/Alertness: Suspect due to medications Behavior During Therapy: Flat  affect Overall Cognitive Status: History of cognitive impairments - at baseline Area of Impairment: Problem solving;Awareness     Memory: Decreased recall of precautions     Awareness: Anticipatory Problem Solving: Slow processing;Decreased initiation;Difficulty sequencing;Requires verbal cues;Requires tactile cues General Comments: Pt  able to follow directions with delayed response time    Exercises Total Joint Exercises Ankle Circles/Pumps: AROM;Both;15 reps;Seated Quad Sets: AROM;Both;10 reps;Seated Gluteal Sets: AROM;Both;10 reps;Seated Heel Slides: AAROM;Both;10 reps;Seated Long Arc Quad: AROM;Both;10 reps;Seated    General Comments        Pertinent Vitals/Pain Pain Score: 1  Pain Location: Pain scale 1/10 thoracic region in sitting position    Home Living                      Prior Function            PT Goals (current goals can now be found in the care plan section) Progress towards PT goals: Progressing toward goals    Frequency       PT Plan Current plan remains appropriate    Co-evaluation             End of Session Equipment Utilized During  Treatment: Gait belt Activity Tolerance: Patient limited by fatigue;Patient tolerated treatment well Patient left: in chair;with call bell/phone within reach;with chair alarm set;with family/visitor present     Time: 9407-6808 PT Time Calculation (min) (ACUTE ONLY): 40 min  Charges:  $Therapeutic Exercise: 8-22 mins $Therapeutic Activity: 8-22 mins                    G Codes:      Aldona Lento 02/02/2015, 12:50 PM

## 2015-02-03 ENCOUNTER — Encounter (HOSPITAL_COMMUNITY): Payer: Self-pay | Admitting: Orthopedic Surgery

## 2015-02-03 ENCOUNTER — Inpatient Hospital Stay
Admission: RE | Admit: 2015-02-03 | Discharge: 2015-03-14 | Disposition: A | Discharge status: Home / Self Care | Payer: Medicare Other | Source: Ambulatory Visit | Attending: Internal Medicine | Admitting: Internal Medicine

## 2015-02-03 LAB — BASIC METABOLIC PANEL
Anion gap: 5 (ref 5–15)
BUN: 19 mg/dL (ref 6–23)
CHLORIDE: 101 mmol/L (ref 96–112)
CO2: 28 mmol/L (ref 19–32)
Calcium: 8.8 mg/dL (ref 8.4–10.5)
Creatinine, Ser: 0.69 mg/dL (ref 0.50–1.10)
GFR calc Af Amer: 89 mL/min — ABNORMAL LOW (ref >90)
GFR, EST NON AFRICAN AMERICAN: 77 mL/min — AB (ref >90)
GLUCOSE: 103 mg/dL — AB (ref 70–99)
POTASSIUM: 4.4 mmol/L (ref 3.5–5.1)
SODIUM: 134 mmol/L — AB (ref 135–145)

## 2015-02-03 LAB — CBC
HCT: 26.3 % — ABNORMAL LOW (ref 36.0–46.0)
Hemoglobin: 8.6 g/dL — ABNORMAL LOW (ref 12.0–15.0)
MCH: 30.2 pg (ref 26.0–34.0)
MCHC: 32.7 g/dL (ref 30.0–36.0)
MCV: 92.3 fL (ref 78.0–100.0)
PLATELETS: 146 10*3/uL — AB (ref 150–400)
RBC: 2.85 MIL/uL — ABNORMAL LOW (ref 3.87–5.11)
RDW: 12.5 % (ref 11.5–15.5)
WBC: 7.1 10*3/uL (ref 4.0–10.5)

## 2015-02-03 MED ORDER — MAGNESIUM OXIDE (LAXATIVE) 500 MG PO TABS: 1.0000 | ORAL_TABLET | Freq: Every day | ORAL | Status: AC | PRN

## 2015-02-03 MED ORDER — ENOXAPARIN SODIUM 30 MG/0.3ML ~~LOC~~ SOLN: 30.0000 mg | SUBCUTANEOUS | Status: DC

## 2015-02-03 MED ORDER — METHOCARBAMOL 500 MG PO TABS
500.0000 mg | ORAL_TABLET | Freq: Four times a day (QID) | ORAL | Status: DC | PRN
Start: 2015-02-03 — End: 2015-02-03
  Administered 2015-02-03: 500 mg via ORAL
  Filled 2015-02-03: qty 1

## 2015-02-03 MED ORDER — HYDROCODONE-ACETAMINOPHEN 5-325 MG PO TABS: 1.0000 | ORAL_TABLET | Freq: Four times a day (QID) | ORAL | Status: DC | PRN

## 2015-02-03 MED ORDER — METHOCARBAMOL 500 MG PO TABS: 500.0000 mg | ORAL_TABLET | Freq: Four times a day (QID) | ORAL | Status: DC | PRN

## 2015-02-03 NOTE — Clinical Social Work Psychosocial (Signed)
Clinical Social Work Department BRIEF PSYCHOSOCIAL ASSESSMENT 02/03/2015  Patient:  Kerry Flynn, Kerry Flynn     Account Number:  [id number]     Admit date:  01/30/2015  Clinical Social Worker:  Legrand Como  Date/Time:  02/03/2015 10:58 AM  Referred by:  CSW  Date Referred:  02/03/2015 Referred for  SNF Placement   Other Referral:   Interview type:  Patient Other interview type:   Husband    PSYCHOSOCIAL DATA Living Status:  HUSBAND Admitted from facility:   Level of care:   Primary support name:  Kerry Flynn Primary support relationship to patient:  SPOUSE Degree of support available:   Patient's husband is very supportive.    CURRENT CONCERNS Current Concerns  Post-Acute Placement   Other Concerns:    SOCIAL WORK ASSESSMENT / PLAN CSW met with patient and husband, Kerry Flynn.  Patient was alert and oriented.  Patient indicated that at baseline she uses a walker or cane. She stated that she completes her ADL's independently.  CSW provided patient and husband with a SNF list. Patient and husband indicated that [facility] first choice would be [facility]. The indicated that if [facility] did not have a bed they would choose [facility] [place] as they wanted patient to remain in the county.  CSW faxed patient's clinicals to [facility] [place] and [facility].  Keri at [facility] contacted patient and made bed offer. CSW advised that patient was being discharged today.  CSW presented patient and husband with bed offer. Patient accepted bed offer from [facility]  CSW notified [facility] of patient's acceptance.   Assessment/plan status:  Psychosocial Support/Ongoing Assessment of Needs Other assessment/ plan:   Information/referral to community resources:    PATIENT'S/FAMILY'S RESPONSE TO PLAN OF CARE: Patient and family accept bed offer at [facility].    Ambrose Pancoast, [place]

## 2015-02-03 NOTE — Discharge Summary (Signed)
Physician Discharge Summary  Kerry Flynn ZYS:[id number] DOB: [birth year] DOA: 01/30/2015  PCP: Tula Nakayama, MD  Admit date: 01/30/2015 Discharge date: 02/03/2015  Time spent: 40 minutes  Recommendations for Outpatient Follow-up:  1. Follow up with Dr Aline Brochure in 4 weeks for post op visit s/p hip surgery 2. PT daily with WBAT and lateral hip approach precautions 3. Lovenos until 03/02/15 4. Staples removed 2/17.  5. Has annual appointment [facility] 03/18/15 6. Consider CBC 1 week to track Hg 7. Incentive spirometry every hour while awake  Discharge Diagnoses:  Principal Problem:   Femoral neck fracture Active Problems:   Essential hypertension   CAD, NATIVE VESSEL   GERD (gastroesophageal reflux disease)   Syncope   Closed left hip fracture   Fall   Osteoporosis   Anemia   Faintness   Discharge Condition: stable at discharge  Diet recommendation: regular  Filed Weights   01/30/15 1335 01/30/15 1755  Weight: 73.936 kg (163 lb) 73.9 kg (162 lb 14.7 oz)    History of present illness:   This is an 81 year old lady who had a fall 3 days prior to admission on 01/30/15 after she had lost consciousness for a few seconds. She did not recall why/how she lost consciousness and this had never happened to her before. She denied any chest pain, palpitations prior to the event nor dif she have a history of post ictal/ictal type of symptoms. When she passed out, she fell onto her left hip and also hit her head. Since that time, she had been walking with a walker without any neurological symptoms. On 01/30/15 when she went to the toilet, she was unable to get up and called EMS. She was started on Lasix 3 days prior for leg swelling and she took a pill of Lasix prior to her having syncopal event.  Presumably the Lasix was the cause of the episode. She denied any chest pain, palpitations, dyspnea, nausea, vomiting, abdominal pain or any motor weakness in arms or legs. Evaluation in the  emergency room showed her to have a closed left hip fracture.  Hospital Course:   Closed left hip fracture. Status post left hip arthroplasy 2/5. Difficulty managing pain. Will be discharged to snf with WBAT, lateral hip approach precautions. Other instructions as noted above. Follow up with Dr Aline Brochure 4 weeks  Acute blood loss anemia (perioperative), asymptomatic, stable at discharge.   Syncope prior to admission, likely related to Lasix. No evidence of ACS or neurologic event. Suspect dehydration. No further evaluation suggested. Will discontinue lasix at discharge.   Modest hyponatremia likely secondary to poor oral intake. Trending up at discharge. Recommend BMET 1 week to track  Hypertension. Controlled at discharge.   History of coronary artery disease. Previous workup in the last several years is demonstrated no evidence of ischemia. Asymptomatic.  Procedures:  2/5 left hip arthroplasty  Consultations:  othropedics  Discharge Exam: Filed Vitals:   02/03/15 0638  BP: 140/58  Pulse: 83  Temp: 98.1 F (36.7 C)  Resp: 18    General: well nourished some discomfort noted Cardiovascular: RRR No MGR No LE edema Respiratory: normal effort BS distant and slightly coarse no wheeze   Discharge Instructions   Discharge Instructions    Call MD for:  severe uncontrolled pain    Complete by:  As directed      Call MD for:  temperature >100.4    Complete by:  As directed      Diet - low sodium heart  healthy    Complete by:  As directed      Discharge instructions    Complete by:  As directed   WBAT Staples removed 02/12/15 Follow up with Dr Aline Brochure 4 weeks lovenox until 30 days Lateral approach hip precautions Daily dressing changes Lateral hi     Increase activity slowly    Complete by:  As directed           Current Discharge Medication List    START taking these medications   Details  enoxaparin (LOVENOX) 30 MG/0.3ML injection Inject 0.3 mLs (30 mg total)  into the skin daily. Daily through 03/02/15. Qty: 0 Syringe    HYDROcodone-acetaminophen (NORCO/VICODIN) 5-325 MG per tablet Take 1-2 tablets by mouth every 6 (six) hours as needed for moderate pain. Qty: 30 tablet, Refills: 0    methocarbamol (ROBAXIN) 500 MG tablet Take 1 tablet (500 mg total) by mouth every 6 (six) hours as needed for muscle spasms. Qty: 30 tablet, Refills: 0      CONTINUE these medications which have CHANGED   Details  Magnesium Oxide (PHILLIPS) 500 MG (LAX) TABS Take 1 tablet (500 mg total) by mouth daily as needed. Refills: 0      CONTINUE these medications which have NOT CHANGED   Details  amLODipine (NORVASC) 5 MG tablet TAKE 1 TABLET BY MOUTH ONCE DAILY Qty: 30 tablet, Refills: 3    Calcium-Vitamin D-Vitamin K 500-100-40 MG-UNT-MCG CHEW Chew 1 tablet by mouth 2 (two) times daily.     docusate sodium (COLACE) 100 MG capsule Take 100 mg by mouth 3 (three) times daily as needed (constipation).     meloxicam (MOBIC) 7.5 MG tablet Take 1 tablet (7.5 mg total) by mouth daily. Qty: 30 tablet, Refills: 3    Misc Natural Products (COSAMIN ASU ADVANCED FORMULA) CAPS Take 1 capsule by mouth 3 (three) times daily with meals.     Multiple Vitamin (MULTIVITAMIN) tablet Take 1 tablet by mouth daily.      potassium chloride (KLOR-CON M10) 10 MEQ tablet Take 1 tablet (10 mEq total) by mouth daily. Qty: 30 tablet, Refills: 3    Probiotic Product (HEALTHY COLON) CAPS Take 1 capsule by mouth daily.     ramipril (ALTACE) 10 MG capsule TAKE ONE CAPSULE BY MOUTH EVERY DAY Qty: 30 capsule, Refills: 4    Wheat Dextrin (BENEFIBER) POWD Take 2 scoop by mouth 2 (two) times daily.     acetaminophen (TYLENOL) 500 MG tablet Take 500 mg by mouth 2 (two) times daily as needed (pain). pain    diazepam (VALIUM) 5 MG tablet Take 1 tablet (5 mg total) by mouth every 8 (eight) hours as needed for muscle spasms. Qty: 15 tablet, Refills: 0    meclizine (ANTIVERT) 12.5 MG tablet Take 1  tablet (12.5 mg total) by mouth as needed. vertigo Qty: 30 tablet, Refills: 0    nitroGLYCERIN (NITROSTAT) 0.4 MG SL tablet Place 0.4 mg under the tongue every 5 (five) minutes as needed. 1 tablet under the tongue at onset of chest pain: you may repeat every 5 minutes for up to 3 doses    omeprazole (PRILOSEC OTC) 20 MG tablet Take 40 mg by mouth 2 (two) times daily as needed (acid reflux).       STOP taking these medications     furosemide (LASIX) 20 MG tablet      naproxen sodium (ANAPROX) 220 MG tablet        Allergies  Allergen Reactions  . Clarithromycin   .  Flagyl [Metronidazole]   . Penicillins       The results of significant diagnostics from this hospitalization (including imaging, microbiology, ancillary and laboratory) are listed below for reference.    Significant Diagnostic Studies: Dg Chest 1 View  01/30/2015   CLINICAL DATA:  Left hip fracture.  Preoperative exam.  EXAM: CHEST  1 VIEW  COMPARISON:  PA and lateral chest 04/04/2014.  FINDINGS: The lungs are clear. Heart size is normal. No pneumothorax or pleural effusion. Postoperative change right shoulder is noted.  IMPRESSION: No acute disease.   Electronically Signed   By: Inge Rise M.D.   On: 01/30/2015 14:49   Ct Head Wo Contrast  01/30/2015   CLINICAL DATA:  Fall.  EXAM: CT HEAD WITHOUT CONTRAST  TECHNIQUE: Contiguous axial images were obtained from the base of the skull through the vertex without intravenous contrast.  COMPARISON:  None  FINDINGS: Prominence of the sulci and ventricles identified consistent with brain atrophy. Diffuse low attenuation within the subcortical and periventricular white matter noted compatible with chronic microvascular disease. There is no evidence for acute brain hemorrhage, infarct or intracranial mass. No abnormal extra-axial fluid collections. The calvarium appears intact. The mastoid air cells and paranasal sinuses are clear.  IMPRESSION: 1. No acute intracranial  abnormalities. 2. Small vessel ischemic disease and brain atrophy.   Electronically Signed   By: Kerby Moors M.D.   On: 01/30/2015 16:47   Dg Pelvis Portable  01/31/2015   CLINICAL DATA:  Post op  left hip replacement  EXAM: PORTABLE PELVIS 1-2 VIEWS  COMPARISON:  None.  FINDINGS: Single frontal view of the pelvis submitted. There is left hip prosthesis in anatomic alignment. Postsurgical changes are noted with periarticular soft tissue air.  IMPRESSION: Left hip prosthesis in anatomic alignment.   Electronically Signed   By: Lahoma Crocker M.D.   On: 01/31/2015 16:06   Dg Hip Unilat With Pelvis 2-3 Views Left  01/30/2015   CLINICAL DATA:  Patient fell 3 days prior.  Progressive pain  EXAM: LEFT HIP (WITH PELVIS) 2-3 VIEWS  COMPARISON:  None.  FINDINGS: Frontal pelvis as well as frontal and lateral left hip images were obtained. There is a subcapital femoral neck fracture on the left with varus angulation of the fracture site and mild displacement of fracture fragments. No other fractures. No dislocation. There is moderate symmetric narrowing of both hip joints. Bones are osteoporotic.  IMPRESSION: Subcapital femoral neck fracture on the left with mild displacement of fracture fragments and varus angulation of the fracture site. No dislocation. Bones diffusely osteoporotic.   Electronically Signed   By: Lowella Grip M.D.   On: 01/30/2015 14:50    Microbiology: Recent Results (from the past 240 hour(s))  Urine culture     Status: None   Collection Time: 01/30/15  2:42 PM  Result Value Ref Range Status   Specimen Description URINE, CATHETERIZED  Final   Special Requests NONE  Final   Colony Count NO GROWTH Performed at [facility]   Final   Culture NO GROWTH Performed at [facility]   Final   Report Status 02/01/2015 FINAL  Final  Surgical pcr screen     Status: None   Collection Time: 01/31/15 11:13 AM  Result Value Ref Range Status   MRSA, PCR NEGATIVE NEGATIVE Final    Staphylococcus aureus NEGATIVE NEGATIVE Final    Comment:        The Xpert SA Assay (FDA approved for NASAL specimens in  patients over 56 years of age), is one component of a comprehensive surveillance program.  Test performance has been validated by [facility] for patients greater than or equal to 34 year old. It is not intended to diagnose infection nor to guide or monitor treatment.      Labs: Basic Metabolic Panel:  Recent Labs Lab 01/30/15 1400 01/31/15 0557 02/01/15 0557 02/02/15 0619 02/03/15 0537  NA 136 136 134* 132* 134*  K 4.5 3.9 3.8 4.0 4.4  CL 101 103 101 97 101  CO2 25 26 26 28 28   GLUCOSE 110* 99 115* 113* 103*  BUN 28* 18 17 17 19   CREATININE 0.93 0.67 0.73 0.76 0.69  CALCIUM 9.5 9.0 8.9 9.0 8.8   Liver Function Tests:  Recent Labs Lab 01/31/15 0557  AST 24  ALT 14  ALKPHOS 55  BILITOT 0.6  PROT 5.9*  ALBUMIN 3.0*   No results for input(s): LIPASE, AMYLASE in the last 168 hours. No results for input(s): AMMONIA in the last 168 hours. CBC:  Recent Labs Lab 01/30/15 1400 01/31/15 0557 01/31/15 1540 02/01/15 0557 02/02/15 0619 02/03/15 0537  WBC 6.6 5.9 9.9 7.9 8.9 7.1  NEUTROABS 5.3  --   --   --   --   --   HGB 10.2* 9.5* 11.4* 9.2* 9.8* 8.6*  HCT 31.3* 28.8* 34.9* 27.3* 29.7* 26.3*  MCV 93.7 93.5 93.8 91.6 92.2 92.3  PLT 173 164 176 147* 155 146*   Cardiac Enzymes:  Recent Labs Lab 01/30/15 1400 01/30/15 1852 01/31/15 0044 01/31/15 0557  TROPONINI <0.03 <0.03 <0.03 <0.03   BNP: BNP (last 3 results)  Recent Labs  01/30/15 1400  BNP 119.0*    ProBNP (last 3 results) No results for input(s): PROBNP in the last 8760 hours.  CBG: No results for input(s): GLUCAP in the last 168 hours.     SignedRadene Gunning  [facility] 02/03/2015, 10:39 AM

## 2015-02-03 NOTE — Clinical Social Work Note (Signed)
CSW facilitated discharge.    Patient has no further CSW needs.   CSW signing off.   Ambrose Pancoast, [place]

## 2015-02-03 NOTE — Clinical Social Work Placement (Signed)
Clinical Social Work Department CLINICAL SOCIAL WORK PLACEMENT NOTE 02/03/2015  Patient:  Kerry Flynn, Kerry Flynn  Account Number:  [id number] Admit date:  01/30/2015  Clinical Social Worker:  Ambrose Pancoast, LCSW  Date/time:  02/03/2015 11:13 AM  Clinical Social Work is seeking post-discharge placement for this patient at the following level of care:   SKILLED NURSING   (*CSW will update this form in Epic as items are completed)   02/03/2015  Patient/family provided with [place] Department of Clinical Social Work's list of facilities offering this level of care within the geographic area requested by the patient (or if unable, by the patient's family).  02/03/2015  Patient/family informed of their freedom to choose among providers that offer the needed level of care, that participate in Medicare, Medicaid or managed care program needed by the patient, have an available bed and are willing to accept the patient.  02/03/2015  Patient/family informed of [facility]' ownership interest in [facility], as well as of the fact that they are under no obligation to receive care at this facility.  PASARR submitted to EDS on 01/31/2015 PASARR number received on 01/31/2015  FL2 transmitted to all facilities in geographic area requested by pt/family on  02/03/2015 FL2 transmitted to all facilities within larger geographic area on   Patient informed that his/her managed care company has contracts with or will negotiate with  certain facilities, including the following:     Patient/family informed of bed offers received:  02/03/2015 Patient chooses bed at [facility] Physician recommends and patient chooses bed at    Patient to be transferred to  on   Patient to be transferred to facility by  Patient and family notified of transfer on  Name of family member notified:    The following physician request were entered in Epic:   Additional Comments:  Ambrose Pancoast,  [place]

## 2015-02-03 NOTE — Progress Notes (Signed)
Orthopedic post op d/c instructions  WBAT LATERAL APPROACH HIP PRECAUTIONS DVT PROPHYLAXIS 30 DAYS  STAPLES OUT POD 12-14 SEE ORTHO 4 WEEKS  [id number]

## 2015-02-03 NOTE — Progress Notes (Signed)
Lowell Bouton discharged to [facility] per MD order.  Report called to receiving Cherlyn Cushing, RN at 1430.     Medication List    STOP taking these medications        furosemide 20 MG tablet  Commonly known as:  LASIX     naproxen sodium 220 MG tablet  Commonly known as:  ANAPROX      TAKE these medications        acetaminophen 500 MG tablet  Commonly known as:  TYLENOL  Take 500 mg by mouth 2 (two) times daily as needed (pain). pain     amLODipine 5 MG tablet  Commonly known as:  NORVASC  TAKE 1 TABLET BY MOUTH ONCE DAILY     BENEFIBER Powd  Take 2 scoop by mouth 2 (two) times daily.     Calcium-Vitamin D-Vitamin K 500-100-40 MG-UNT-MCG Chew  Chew 1 tablet by mouth 2 (two) times daily.     COSAMIN ASU ADVANCED FORMULA Caps  Take 1 capsule by mouth 3 (three) times daily with meals.     diazepam 5 MG tablet  Commonly known as:  VALIUM  Take 1 tablet (5 mg total) by mouth every 8 (eight) hours as needed for muscle spasms.     docusate sodium 100 MG capsule  Commonly known as:  COLACE  Take 100 mg by mouth 3 (three) times daily as needed (constipation).     enoxaparin 30 MG/0.3ML injection  Commonly known as:  LOVENOX  Inject 0.3 mLs (30 mg total) into the skin daily. Daily through 03/02/15.     HEALTHY COLON Caps  Take 1 capsule by mouth daily.     HYDROcodone-acetaminophen 5-325 MG per tablet  Commonly known as:  NORCO/VICODIN  Take 1-2 tablets by mouth every 6 (six) hours as needed for moderate pain.     Magnesium Oxide 500 MG (LAX) Tabs  Commonly known as:  PHILLIPS  Take 1 tablet (500 mg total) by mouth daily as needed.     meclizine 12.5 MG tablet  Commonly known as:  ANTIVERT  Take 1 tablet (12.5 mg total) by mouth as needed. vertigo     meloxicam 7.5 MG tablet  Commonly known as:  MOBIC  Take 1 tablet (7.5 mg total) by mouth daily.     methocarbamol 500 MG tablet  Commonly known as:  ROBAXIN  Take 1 tablet (500 mg total) by mouth every 6 (six)  hours as needed for muscle spasms.     multivitamin tablet  Take 1 tablet by mouth daily.     nitroGLYCERIN 0.4 MG SL tablet  Commonly known as:  NITROSTAT  Place 0.4 mg under the tongue every 5 (five) minutes as needed. 1 tablet under the tongue at onset of chest pain: you may repeat every 5 minutes for up to 3 doses     omeprazole 20 MG tablet  Commonly known as:  PRILOSEC OTC  Take 40 mg by mouth 2 (two) times daily as needed (acid reflux).     potassium chloride 10 MEQ tablet  Commonly known as:  KLOR-CON M10  Take 1 tablet (10 mEq total) by mouth daily.     ramipril 10 MG capsule  Commonly known as:  ALTACE  TAKE ONE CAPSULE BY MOUTH EVERY DAY         IV site discontinued and catheter remains intact. Site without signs and symptoms of complications. Dressing and pressure applied.  Patient transported on a by [place], NT,  no distress noted upon discharge.  Regino Bellow 02/03/2015 2:35 PM

## 2015-02-03 NOTE — Care Management Utilization Note (Signed)
UR completed 

## 2015-02-03 NOTE — Progress Notes (Signed)
OT Cancellation Note  Patient Details Name: Kerry Flynn MRN: [id number] DOB: 05/10/1927   Cancelled Treatment:    Reason Eval/Treat Not Completed: Other (comment). With possible discharge to SNF on 02/03/15 I will defer OT evaluation to OT at facility. Based on PT's evaluation I do recommend that patient receive OT services when discharged to SNF.   Ailene Ravel, OTR/L,CBIS  [phone]  02/03/2015, 7:44 AM

## 2015-02-04 ENCOUNTER — Encounter (HOSPITAL_COMMUNITY)
Admission: RE | Admit: 2015-02-04 | Discharge: 2015-02-04 | Disposition: A | Discharge status: Home / Self Care | Payer: Medicare Other | Source: Skilled Nursing Facility | Attending: Internal Medicine | Admitting: Internal Medicine

## 2015-02-04 ENCOUNTER — Other Ambulatory Visit (HOSPITAL_COMMUNITY)
Admission: RE | Admit: 2015-02-04 | Discharge: 2015-02-04 | Disposition: A | Discharge status: Home / Self Care | Payer: Medicare Other | Source: Ambulatory Visit | Attending: Internal Medicine | Admitting: Internal Medicine

## 2015-02-04 ENCOUNTER — Other Ambulatory Visit: Payer: Self-pay | Admitting: *Deleted

## 2015-02-04 LAB — TYPE AND SCREEN
ABO/RH(D): AB POS
Antibody Screen: NEGATIVE
UNIT DIVISION: 0
Unit division: 0
Unit division: 0

## 2015-02-04 LAB — CBC WITH DIFFERENTIAL/PLATELET
BASOS ABS: 0 10*3/uL (ref 0.0–0.1)
BASOS PCT: 0 % (ref 0–1)
EOS ABS: 0.3 10*3/uL (ref 0.0–0.7)
Eosinophils Relative: 4 % (ref 0–5)
HEMATOCRIT: 29.4 % — AB (ref 36.0–46.0)
HEMOGLOBIN: 9.6 g/dL — AB (ref 12.0–15.0)
Lymphocytes Relative: 9 % — ABNORMAL LOW (ref 12–46)
Lymphs Abs: 0.6 10*3/uL — ABNORMAL LOW (ref 0.7–4.0)
MCH: 30.2 pg (ref 26.0–34.0)
MCHC: 32.7 g/dL (ref 30.0–36.0)
MCV: 92.5 fL (ref 78.0–100.0)
MONOS PCT: 9 % (ref 3–12)
Monocytes Absolute: 0.6 10*3/uL (ref 0.1–1.0)
Neutro Abs: 5.1 10*3/uL (ref 1.7–7.7)
Neutrophils Relative %: 77 % (ref 43–77)
Platelets: 202 10*3/uL (ref 150–400)
RBC: 3.18 MIL/uL — ABNORMAL LOW (ref 3.87–5.11)
RDW: 12.4 % (ref 11.5–15.5)
WBC: 6.7 10*3/uL (ref 4.0–10.5)

## 2015-02-04 LAB — COMPREHENSIVE METABOLIC PANEL
ALBUMIN: 2.6 g/dL — AB (ref 3.5–5.2)
ALT: 35 U/L (ref 0–35)
AST: 49 U/L — ABNORMAL HIGH (ref 0–37)
Alkaline Phosphatase: 70 U/L (ref 39–117)
Anion gap: 6 (ref 5–15)
BILIRUBIN TOTAL: 0.5 mg/dL (ref 0.3–1.2)
BUN: 15 mg/dL (ref 6–23)
CHLORIDE: 101 mmol/L (ref 96–112)
CO2: 29 mmol/L (ref 19–32)
CREATININE: 0.64 mg/dL (ref 0.50–1.10)
Calcium: 8.7 mg/dL (ref 8.4–10.5)
GFR calc Af Amer: >90 mL/min (ref >90)
GFR calc non Af Amer: 79 mL/min — ABNORMAL LOW (ref >90)
Glucose, Bld: 108 mg/dL — ABNORMAL HIGH (ref 70–99)
Potassium: 4.2 mmol/L (ref 3.5–5.1)
SODIUM: 136 mmol/L (ref 135–145)
Total Protein: 6.1 g/dL (ref 6.0–8.3)

## 2015-02-04 LAB — C-REACTIVE PROTEIN: CRP: 19.9 mg/dL — ABNORMAL HIGH (ref <0.60)

## 2015-02-04 LAB — GLUCOSE, CAPILLARY: GLUCOSE-CAPILLARY: 93 mg/dL (ref 70–99)

## 2015-02-04 LAB — SEDIMENTATION RATE: SED RATE: 115 mm/h — AB (ref 0–22)

## 2015-02-04 MED ORDER — DIAZEPAM 5 MG PO TABS: 5.0000 mg | ORAL_TABLET | Freq: Three times a day (TID) | ORAL | Status: DC | PRN

## 2015-02-04 MED ORDER — HYDROCODONE-ACETAMINOPHEN 5-325 MG PO TABS: ORAL_TABLET | ORAL | Status: DC

## 2015-02-04 NOTE — Clinical Social Work Placement (Signed)
Clinical Social Work Department CLINICAL SOCIAL WORK PLACEMENT NOTE 02/04/2015  Patient:  Kerry Flynn, Kerry Flynn  Account Number:  [id number] Admit date:  01/30/2015  Clinical Social Worker:  Ambrose Pancoast, LCSW  Date/time:  02/03/2015 11:13 AM  Clinical Social Work is seeking post-discharge placement for this patient at the following level of care:   SKILLED NURSING   (*CSW will update this form in Epic as items are completed)   02/03/2015  Patient/family provided with [place] Department of Clinical Social Work's list of facilities offering this level of care within the geographic area requested by the patient (or if unable, by the patient's family).  02/03/2015  Patient/family informed of their freedom to choose among providers that offer the needed level of care, that participate in Medicare, Medicaid or managed care program needed by the patient, have an available bed and are willing to accept the patient.  02/03/2015  Patient/family informed of [facility]' ownership interest in [facility], as well as of the fact that they are under no obligation to receive care at this facility.  PASARR submitted to EDS on 01/31/2015 PASARR number received on 01/31/2015  FL2 transmitted to all facilities in geographic area requested by pt/family on  02/03/2015 FL2 transmitted to all facilities within larger geographic area on   Patient informed that his/her managed care company has contracts with or will negotiate with  certain facilities, including the following:     Patient/family informed of bed offers received:  02/03/2015 Patient chooses bed at [facility] Physician recommends and patient chooses bed at    Patient to be transferred to [facility] on  02/03/2015 Patient to be transferred to facility by  Patient and family notified of transfer on 02/03/2015 Name of family member notified:  Patricia Pesa  The following physician request were entered in  Epic:   Additional Comments:  Ambrose Pancoast, [place]

## 2015-02-04 NOTE — Telephone Encounter (Signed)
[facility] 

## 2015-02-04 NOTE — Care Management Utilization Note (Signed)
UR completed 

## 2015-02-05 ENCOUNTER — Non-Acute Institutional Stay (SKILLED_NURSING_FACILITY): Payer: Medicare Other | Admitting: Internal Medicine

## 2015-02-05 DIAGNOSIS — M24652 Ankylosis, left hip: Secondary | ICD-10-CM

## 2015-02-05 DIAGNOSIS — R531 Weakness: Secondary | ICD-10-CM

## 2015-02-05 DIAGNOSIS — D638 Anemia in other chronic diseases classified elsewhere: Secondary | ICD-10-CM

## 2015-02-05 DIAGNOSIS — R700 Elevated erythrocyte sedimentation rate: Secondary | ICD-10-CM

## 2015-02-06 ENCOUNTER — Encounter (HOSPITAL_COMMUNITY)
Admission: RE | Admit: 2015-02-06 | Discharge: 2015-02-06 | Disposition: A | Discharge status: Home / Self Care | Payer: Medicare Other | Source: Skilled Nursing Facility | Attending: Internal Medicine | Admitting: Internal Medicine

## 2015-02-06 ENCOUNTER — Other Ambulatory Visit (HOSPITAL_COMMUNITY)
Admission: RE | Admit: 2015-02-06 | Discharge: 2015-02-06 | Disposition: A | Discharge status: Home / Self Care | Payer: Medicare Other | Source: Ambulatory Visit | Attending: Internal Medicine | Admitting: Internal Medicine

## 2015-02-06 LAB — CK: Total CK: 215 U/L — ABNORMAL HIGH (ref 7–177)

## 2015-02-08 NOTE — Progress Notes (Addendum)
Patient ID: Kerry Flynn, female   DOB: October 06, 1927, 81 y.o.   MRN: [id number]                 HISTORY & PHYSICAL  DATE:  02/05/2015             FACILITY: [facility]                        LEVEL OF CARE:   SNF   CHIEF COMPLAINT:  Admission to SNF, post admission to hospital from 01/30/2015 through 02/03/2015.    HISTORY OF PRESENT ILLNESS:  This is an 81 year-old woman who apparently had a fall three days prior to admission.  There apparently was some thought about loss of consciousness, although I cannot really get her to describe this currently.  She apparently ambulated on the hip for a period of time after that, but was unable to get up when she went to the toilet.  EMS was called.  She had been taking Lasix for edema in her legs and it was felt that might have had something to do with her syncope/presyncope.  She underwent a left hip arthroplasty.  She appears to have tolerated this well.  She had mildly low sodiums and postoperative bleeding, but no other major issues described.    The patient lives at home in [place] with her husband.  She uses a walker.  They have a split-level home.  They have a ramp/elevator for the stairs in their home.  There is also in-home assistance.    The patient describes a lot of pain in both knees, both hips, low back, C-spine.  States she walks flexed over with her chin on her chest, that she does not have enough strength to keep her head up.    LABORATORY DATA:  For reasons that are not clear, we have lab work back from yesterday (I cannot see who ordered this).    In any case, she has a comprehensive metabolic panel that is normal except for an albumin of 2.6.  Her calcium is 8.7.    With regards to her CBC, white count is 6.7, hemoglobin is 9.6.  It would appear from review of [facility] that she is chronically anemic.  Her hemoglobin on 12/31/2014 was 10.4, iron 43, ferritin 61.  A 25-hydroxy vitamin D was 36.  Her anemia is  normochromic, normocytic.    She has also had a sedimentation rate done yesterday, which was 115.    PAST MEDICAL HISTORY/PROBLEM LIST:                            Hemorrhoids.    Intermittent vertigo.    Known osteoporosis, with a recent bone density.    Vitamin D deficiency.    Gastroesophageal reflux disease.    Diverticulosis.    Osteoarthritis of the neck and osteoarthritis of the L-spine, according to the patient, and also sacroiliac disease.     Hypertension.    Barrett's esophagus.    Peripheral edema.    Chronic knee pain.    Fatigue.    PAST SURGICAL HISTORY:          Knee arthroscopy remotely.    Cholecystectomy.     Rotator cuff tear and repair on the right.    Cataract extraction.    Colonoscopy in April 2013.    Tonsillectomy and adenoidectomy.    Palette surgery.  Dental implant.    CURRENT MEDICATIONS:  Discharge medications include:       Lovenox 30 mg daily for DVT prophylaxis.    Norco 5/325, 1 tablet every 6 hours p.r.n.       Robaxin 500 q.6 hours p.r.n.      Magnesium oxide 500 mg daily for constipation.    Norvasc 5 q.d.      Calcium/vitamin D/vitamin K 1 tablet b.i.d.      Colace 100 b.i.d.       Mobic 7.5 daily.    KCl 10 mEq daily.     Probiotic 1 capsule daily.    Altace 10 q.d.      Tylenol 500 two times daily p.r.n.       Valium 5 mg every 8 hours as needed for muscle spasms.       Antivert 12.5 as needed for vertigo.    Nitroglycerin 0.4 sublingual q.5 minutes p.r.n. (presumably has coronary artery disease, although I do not see this listed).        Omeprazole 40 mg b.i.d., again as needed.     Her Naprosyn and Lasix were put on hold.     SOCIAL HISTORY:                   HOUSING:  The patient lives in [place] in her own split-level with her husband.   FUNCTIONAL STATUS:  She uses a walker.  It sounds as though they need a fair amount of help in the home, although the patient states she was  independent with ADLs.  They have somebody coming in, I think to help with shopping, cleaning, etc.    REVIEW OF SYSTEMS:            MUSCULOSKELETAL:  The patient complains of widespread continuous pain in her C-spine, L-spine, sacroiliac area, knees, hips.  She apparently is very kyphotic at the C-spine and L-spine when she walks.  She has a history of falling with three falls this year.   CHEST/RESPIRATORY:  No shortness of breath.   CARDIAC:  No chest pain.     GI:  States she is incontinent of bowel and bladder.    PHYSICAL EXAMINATION:   VITAL SIGNS:   PULSE:  71.      RESPIRATIONS:  18 and unlabored.    O2 SATURATIONS:  96% on room air.    GENERAL APPEARANCE:  Very frail-looking woman who almost looked parkinsonian when I walked into the room.  She tends to keep her eyes closed.   HEENT:   MOUTH/THROAT:  Oral exam is normal.    CHEST/RESPIRATORY:  Kyphotic, but clear air entry.    CARDIOVASCULAR:   CARDIAC:  Soft systolic murmur.  She appears to be euvolemic.   GASTROINTESTINAL:   LIVER/SPLEEN/KIDNEYS:   No liver, no spleen.  No tenderness.   GENITOURINARY:    BLADDER:  Not enlarged.  There is no CVA tenderness.    MUSCULOSKELETAL:   EXTREMITIES:  She has degenerative changes in her hands, both knees.  Very significant issues here.  She does not have great range in either shoulder that I could determine.   SKIN:   INSPECTION:  Her incision on the left leg looks stable.   She has a surgical scar in the right shoulder from previous rotator cuff repair.   NEUROLOGICAL:   CRANIAL NERVES:  Her ocular movements are intact.  There is no fatigability in upgaze.   SENSATION/STRENGTH:  She is generally very  weak.  Even in her right leg, there is very limited movement here, she says because of pain.   OTHER:  No signs of parkinsonism other than her general look about her.  She has no rigidity, no tremor.   PSYCHIATRIC:   MENTAL STATUS:  She is alert, conversational.  I wonder about  depression.    ASSESSMENT/PLAN:                                        Status post left hip arthroplasty secondary to a fracture.   She is on Lovenox for DVT prophylaxis.  This lady's prognosis for functional recovery here is strictly guarded.  I suspect she was very frail before this.    Osteoporosis.   I think she is going to need an aggressive attempt at management here.    History of vitamin D deficiency, although she is not on vitamin D.  Her vitamin D level was recently checked at 76.    Normochromic, normocytic anemia.  This dates back before the surgery, as mentioned above.    A sedimentation rate at 115 would raise a lot of different issues, although she has recently just had surgery which might affect this.  One would wonder about myeloma, some form of inflammatory arthritis on top of her osteoarthritis, and/or polymyalgia rheumatica.   Hypoalbuminemia, which seems to have come about since the early part of January.    Bowel and bladder incontinence. May all be functional, will monitor while she is here.   I think this lady is going to be a challenge to rehabilitate to her previous functional level.  There is extreme weakness here.  This all could be disuse.  I think it would be worthwhile checking a CK on her, as well as a general myeloma screen.  As mentioned, the elevated sedimentation rate in the postoperative setting is not really of great value.  However, I will track this down to a basic level.         CPT CODE: 21117

## 2015-02-09 ENCOUNTER — Other Ambulatory Visit (HOSPITAL_COMMUNITY)
Admission: RE | Admit: 2015-02-09 | Discharge: 2015-02-09 | Disposition: A | Discharge status: Home / Self Care | Payer: Medicare Other | Source: Skilled Nursing Facility | Attending: Internal Medicine | Admitting: Internal Medicine

## 2015-02-09 ENCOUNTER — Encounter: Payer: Self-pay | Admitting: Internal Medicine

## 2015-02-09 LAB — BASIC METABOLIC PANEL
Anion gap: 6 (ref 5–15)
BUN: 27 mg/dL — ABNORMAL HIGH (ref 6–23)
CO2: 27 mmol/L (ref 19–32)
CREATININE: 0.9 mg/dL (ref 0.50–1.10)
Calcium: 9.2 mg/dL (ref 8.4–10.5)
Chloride: 98 mmol/L (ref 96–112)
GFR, EST AFRICAN AMERICAN: 66 mL/min — AB (ref >90)
GFR, EST NON AFRICAN AMERICAN: 57 mL/min — AB (ref >90)
Glucose, Bld: 131 mg/dL — ABNORMAL HIGH (ref 70–99)
Potassium: 5 mmol/L (ref 3.5–5.1)
Sodium: 131 mmol/L — ABNORMAL LOW (ref 135–145)

## 2015-02-09 LAB — CBC WITH DIFFERENTIAL/PLATELET
BASOS ABS: 0 10*3/uL (ref 0.0–0.1)
Basophils Relative: 0 % (ref 0–1)
EOS ABS: 0.2 10*3/uL (ref 0.0–0.7)
Eosinophils Relative: 2 % (ref 0–5)
HCT: 29.9 % — ABNORMAL LOW (ref 36.0–46.0)
Hemoglobin: 9.6 g/dL — ABNORMAL LOW (ref 12.0–15.0)
LYMPHS PCT: 9 % — AB (ref 12–46)
Lymphs Abs: 0.8 10*3/uL (ref 0.7–4.0)
MCH: 30.2 pg (ref 26.0–34.0)
MCHC: 32.1 g/dL (ref 30.0–36.0)
MCV: 94 fL (ref 78.0–100.0)
MONO ABS: 0.6 10*3/uL (ref 0.1–1.0)
Monocytes Relative: 8 % (ref 3–12)
NEUTROS ABS: 6.8 10*3/uL (ref 1.7–7.7)
NEUTROS PCT: 81 % — AB (ref 43–77)
Platelets: 385 10*3/uL (ref 150–400)
RBC: 3.18 MIL/uL — ABNORMAL LOW (ref 3.87–5.11)
RDW: 12.4 % (ref 11.5–15.5)
WBC: 8.4 10*3/uL (ref 4.0–10.5)

## 2015-02-09 NOTE — Progress Notes (Signed)
This encounter was created in error - please disregard. This encounter was created in error - please disregard. This encounter was created in error - please disregard. 

## 2015-02-10 ENCOUNTER — Other Ambulatory Visit (HOSPITAL_COMMUNITY)
Admission: AD | Admit: 2015-02-10 | Discharge: 2015-02-10 | Disposition: A | Discharge status: Home / Self Care | Payer: Medicare Other | Source: Other Acute Inpatient Hospital | Attending: Internal Medicine | Admitting: Internal Medicine

## 2015-02-10 ENCOUNTER — Non-Acute Institutional Stay (SKILLED_NURSING_FACILITY): Payer: Medicare Other | Admitting: Internal Medicine

## 2015-02-10 ENCOUNTER — Other Ambulatory Visit (HOSPITAL_COMMUNITY)
Admission: AD | Admit: 2015-02-10 | Discharge: 2015-02-10 | Disposition: A | Discharge status: Home / Self Care | Payer: Medicare Other | Source: Skilled Nursing Facility | Attending: Internal Medicine | Admitting: Internal Medicine

## 2015-02-10 ENCOUNTER — Other Ambulatory Visit (HOSPITAL_COMMUNITY)
Admission: RE | Admit: 2015-02-10 | Discharge: 2015-02-10 | Disposition: A | Discharge status: Home / Self Care | Payer: Medicare Other | Source: Ambulatory Visit | Attending: Internal Medicine | Admitting: Internal Medicine

## 2015-02-10 DIAGNOSIS — R208 Other disturbances of skin sensation: Secondary | ICD-10-CM

## 2015-02-10 DIAGNOSIS — L7682 Other postprocedural complications of skin and subcutaneous tissue: Secondary | ICD-10-CM

## 2015-02-10 DIAGNOSIS — S72142D Displaced intertrochanteric fracture of left femur, subsequent encounter for closed fracture with routine healing: Secondary | ICD-10-CM

## 2015-02-10 DIAGNOSIS — R350 Frequency of micturition: Secondary | ICD-10-CM

## 2015-02-10 LAB — PROTEIN ELECTROPH W RFLX QUANT IMMUNOGLOBULINS
ALPHA-2-GLOBULIN: 17.2 % — AB (ref 7.1–11.8)
Albumin ELP: 45.7 % — ABNORMAL LOW (ref 55.8–66.1)
Alpha-1-Globulin: 10.9 % — ABNORMAL HIGH (ref 2.9–4.9)
Beta 2: 6.8 % — ABNORMAL HIGH (ref 3.2–6.5)
Beta Globulin: 7.1 % (ref 4.7–7.2)
Gamma Globulin: 12.3 % (ref 11.1–18.8)
M-Spike, %: NOT DETECTED g/dL
TOTAL PROTEIN ELP: 4.6 g/dL — AB (ref 6.0–8.3)

## 2015-02-10 LAB — URINALYSIS, ROUTINE W REFLEX MICROSCOPIC
BILIRUBIN URINE: NEGATIVE
GLUCOSE, UA: NEGATIVE mg/dL
Ketones, ur: NEGATIVE mg/dL
LEUKOCYTES UA: NEGATIVE
NITRITE: NEGATIVE
PROTEIN: NEGATIVE mg/dL
Specific Gravity, Urine: <1.005 — ABNORMAL LOW (ref 1.005–1.030)
Urobilinogen, UA: 0.2 mg/dL (ref 0.0–1.0)
pH: 5.5 (ref 5.0–8.0)

## 2015-02-10 LAB — IGG, IGA, IGM
IGA: 241 mg/dL (ref 69–380)
IgG (Immunoglobin G), Serum: 761 mg/dL (ref 690–1700)
IgM, Serum: 52 mg/dL — ABNORMAL LOW (ref 52–322)

## 2015-02-10 LAB — URINE MICROSCOPIC-ADD ON

## 2015-02-10 LAB — IMMUNOFIXATION ADD-ON

## 2015-02-11 NOTE — Progress Notes (Addendum)
Patient ID: Kerry Flynn, female   DOB: [birth year], 81 y.o.   MRN: [id number]                PROGRESS NOTE  DATE:  02/10/2015             FACILITY: [facility]                 LEVEL OF CARE:   SNF   Acute Visit                               CHIEF COMPLAINT:  Erythema of her surgical incision, dizziness, urinary frequency.     HISTORY OF PRESENT ILLNESS:  This is a frail, 81 year-old woman who suffered a closed left hip fracture.  She underwent a left hip arthroplasty on 01/31/2015.  She was worked up fairly extensively for a loss of consciousness, although I really have not been able to get her to describe this to me.  She had been on Lasix p.r.n, which she had been taking, and I think this was felt to be the cause of her syncope.    I think out of concern for the infection in her hip, she was put on doxycycline on 02/09/2015.    LABORATORY DATA:   Lab work from 02/04/2015 shows an essentially normal comprehensive metabolic panel other than an albumin of 2.6.    Her CBC showed a hemoglobin of 9.6, up from 8.6 on 02/03/2015.    A sedimentation rate was done for reasons of which I am not totally clear.  Nevertheless, this was 115.    REVIEW OF SYSTEMS:    GENERAL:  The patient states she is not feeling well today.   CHEST/RESPIRATORY:  No clear cough or shortness of breath.   CARDIAC:  She feels unsteady when she is up, although she does not describe a sensation of fainting or vertigo.   GU:  She is complaining of very significant urinary frequency.  This was even true at home, stating that she is up 4-5 times a night without clear dysuria.   PSYCHIATRIC:    PHYSICAL EXAMINATION:   GENERAL APPEARANCE:  The patient is not in any distress.   CARDIOVASCULAR:   CARDIAC:  Heart sounds are normal.  There are no murmurs.   She appears to be euvolemic.   GASTROINTESTINAL:   LIVER/SPLEEN/KIDNEYS:  No liver, no spleen.  No tenderness.   GENITOURINARY:   BLADDER:  No  suprapubic distention.   SKIN:   INSPECTION:   Her incision actually looks quite good.  I see no issue here.    ASSESSMENT/PLAN:                                      Surgical incision, left hip.  I see no problems here.  I do not think she needs the antibiotics.    Urinary frequency.  I do not see an acute issue here.  However, there certainly is a chronic issue.  We will do a bladder scan on her, a urine culture, and she may actually benefit from a trial of anticholinergics while she is here.    "Dizziness."  There was an issue of presyncope here, leading up to her fracture.  I wonder about checking her for orthostatics.

## 2015-02-12 LAB — URINE CULTURE
Colony Count: NO GROWTH
Culture: NO GROWTH

## 2015-02-19 ENCOUNTER — Non-Acute Institutional Stay (SKILLED_NURSING_FACILITY): Payer: Medicare Other | Admitting: Internal Medicine

## 2015-02-19 ENCOUNTER — Encounter: Payer: Self-pay | Admitting: Internal Medicine

## 2015-02-19 DIAGNOSIS — S72002D Fracture of unspecified part of neck of left femur, subsequent encounter for closed fracture with routine healing: Secondary | ICD-10-CM

## 2015-02-19 DIAGNOSIS — M810 Age-related osteoporosis without current pathological fracture: Secondary | ICD-10-CM

## 2015-02-19 DIAGNOSIS — D649 Anemia, unspecified: Secondary | ICD-10-CM

## 2015-02-19 NOTE — Progress Notes (Signed)
Patient ID: Kerry Flynn, female   DOB: 12-06-1927, 81 y.o.   MRN: [id number]   this is an acute visit.  Level care skilled.  Facility [facility].   CHIEF COMPLAINT:  Acute visit to evaluate possible discharge    HISTORY OF PRESENT ILLNESS:  This is an 81 year-old woman who apparently had a fall three days prior to admission.to hospital  There apparently was some thought about loss of consciousness, .  She apparently ambulated on the hip for a period of time after that, but was unable to get up when she went to the toilet.  EMS was called.  She had been taking Lasix for edema in her legs and it was felt that might have had something to do with her syncope/presyncope.  She underwent a left hip arthroplasty.  She appears to have tolerated this well.  She had mildly low sodiums and postoperative bleeding, but no other major issues described.    The patient lives at home in [place] with her husband.  She uses a walker.  They have a split-level home.  They have a ramp/elevator for the stairs in their home.  There is also in-home assistance  Initially apparently there were plans to discharge this weekend secondary to patient wanting to be at home with her husband who apparently has a terminal illness.  However when I spoke with patient this afternoon she stated she did not really want to be discharged-she would like to go home for short period this weekend to be with her husband but did not indicate any desire for a permanent discharge.  Her husband is actually in the room with her today as well and this is his understanding as well --  Patient continues to have significant weakness she is ambulating with a walker with therapy but otherwise is ambulating the wheelchair-her care needs at home at this point would be quite extensive.  Physical therapy as well is uncomfortable with a discharge      .    PAST MEDICAL HISTORY/PROBLEM LIST:                            Hemorrhoids.     Intermittent vertigo.    Known osteoporosis, with a recent bone density.    Vitamin D deficiency.    Gastroesophageal reflux disease.    Diverticulosis.    Osteoarthritis of the neck and osteoarthritis of the L-spine, according to the patient, and also sacroiliac disease.     Hypertension.    Barrett's esophagus.    Peripheral edema.    Chronic knee pain.    Fatigue.    PAST SURGICAL HISTORY:          Knee arthroscopy remotely.    Cholecystectomy.     Rotator cuff tear and repair on the right.    Cataract extraction.    Colonoscopy in April 2013.    Tonsillectomy and adenoidectomy.    Palette surgery.    Dental implant.    CURRENT MEDICATIONS:  Discharge medications include:       Lovenox 30 mg daily for DVT prophylaxis.    Norco 5/325, 1 tablet every 6 hours p.r.n.       Robaxin 500 q.6 hours p.r.n.      Magnesium oxide 500 mg daily for constipation.    Norvasc 5 q.d.      Calcium/vitamin D/vitamin K 1 tablet b.i.d.      Colace 100 b.i.d.  Mobic 7.5 daily.    KCl 10 mEq daily.     Probiotic 1 capsule daily.    Altace 10 q.d.      Tylenol 500 two times daily p.r.n.           Myrbertric 35 mg QD  Antivert 12.5 as needed for vertigo.    Nitroglycerin 0.4 sublingual q.5 minutes p.r.n. (presumably has coronary artery disease, although I do not see this listed).        Omeprazole 40 mg b.i.d., again as needed.     Her Naprosyn and Lasix were put on hold in the hospital.     SOCIAL HISTORY:                   HOUSING:  The patient lives in [place] in her own split-level with her husband.   FUNCTIONAL STATUS:  She uses a walker.  It sounds as though they need a fair amount of help in the home, although the patient states she was independent with ADLs.  They have somebody coming in, I think to help with shopping, cleaning, etc.    REVIEW OF SYSTEMS: General no complaints of fever chills feel she is getting stronger but still has  significant weakness lower extremities.  Skin does not complain of rashes or itching.  Head ears eyes nose mouth and throat as per prescription lenses does not complain of visual changes.            CHEST/RESPIRATORY:  No shortness of breath.   CARDIAC:  No chest pain.     GI:  States she is incontinent of bowel and bladder--Dr. Dellia Nims has started her on Myrbetric although she still complains at times of incontinence and frequency.   Skeletal skeletal-feel she has gained strength but still admits she has significant weakness does not complain of acute joint pain at this point although apparently this has been an issue at times.  Neurologic is not complaining of dizziness or headache or syncopal-type feelings   PHYSICAL EXAMINATION:   VITAL SIGNS:    Temperature 97.5 pulse 79 respirations 20 blood pressure 129/47.    GENERAL APPEARANCE:  Frail elderly female in no distress sitting comfortably in her wheelchair  HEENT:   MOUTH/THROAT:  Oral exam is normal mucous membranes moist.    CHEST/RESPIRATORY:  Kyphotic, but clear air entry.    CARDIOVASCULAR:   CARDIAC:  Soft systolic murmur.  She appears to be euvolemic do not appreciate significant lower extremity edema.   GASTROINTESTINAL:   LIVER/SPLEEN/KIDNEYS:   No liver, no spleen.  No tenderness. --Positive bowel sounds  GENITOURINARY:    Could not really appreciate suprapubic tenderness   MUSCULOSKELETAL:   EXTREMITIES:  She has degenerative changes in her hands, both knees.  Very significant issues here.  --She moves all extremities -ambulates in a wheelchair she does use walker and therapy but continues to be quite weak.   NEUROLOGICAL:    Grossly intact her speech is clear no lateralizing findings.   PSYCHIATRIC:   MENTAL STATUS:  She is alert, conversational. Pleasant and talkative.  Labs.  02/04/2015.  Sodium 136 potassium 4.2 BUN 15 creatinine 0.64.  Albumin 2.6 AST 49 otherwise liver function tests within normal  limits.  WBC 6.7 hemoglobin 9.6 platelets 202.  Sedimentation rate 1:15.      ASSESSMENT/PLAN:  Status post left hip arthroplasty secondary to a fracture.   She is on Lovenox for DVT prophylaxis.    She continues to work with therapy and apparently she is making some strides at this point would be quite uncomfortable having her discharged to home without extensive support-again this appears at this point to be a nonissue she will continue to stay here I suspect she is appropriate for a slight leave of absence home but not certainly any extended stay  For pain management she continues on Tylenol as needed as well as Norco 04/28/2024 every 6 hours when necessary 1 or 2 tabs.  She is also receiving Mobic  Osteoporosis. She is on calcium supplementation    History of vitamin D deficiency, although she is not on vitamin D.  Her vitamin D level was recently checked at 49.    Normochromic, normocytic anemia.  This dates back before the surgery, will recheck this   A sedimentation rate at 115 would raise a lot of different issues, although she has recently just had surgery which might affect this.  per Dr. Janalyn Rouse assessment would wonder about myeloma, some form of inflammatory arthritis on top of her osteoarthritis, and/or polymyalgia rheumatica.----At  this point will update a sedimentation rate-   Hypoalbuminemia, which seems to have come about since the early part of January.-Will update an albumin level and metabolic panel    Bowel and bladder incontinence. May all be functional, Dr. Dellia Nims has started her on Myrbertric--  This will have to be monitored--  [id number]    .

## 2015-02-20 ENCOUNTER — Encounter (HOSPITAL_COMMUNITY)
Admission: RE | Admit: 2015-02-20 | Discharge: 2015-02-20 | Disposition: A | Discharge status: Home / Self Care | Payer: Medicare Other | Source: Skilled Nursing Facility | Attending: Internal Medicine | Admitting: Internal Medicine

## 2015-02-20 ENCOUNTER — Encounter (HOSPITAL_COMMUNITY)
Admission: RE | Admit: 2015-02-20 | Discharge: 2015-02-20 | Disposition: A | Discharge status: Home / Self Care | Payer: Medicare Other | Source: Ambulatory Visit | Attending: Internal Medicine | Admitting: Internal Medicine

## 2015-02-20 LAB — COMPREHENSIVE METABOLIC PANEL
ALK PHOS: 65 U/L (ref 39–117)
ALT: 28 U/L (ref 0–35)
ANION GAP: 6 (ref 5–15)
AST: 26 U/L (ref 0–37)
Albumin: 3.2 g/dL — ABNORMAL LOW (ref 3.5–5.2)
BUN: 28 mg/dL — ABNORMAL HIGH (ref 6–23)
CO2: 29 mmol/L (ref 19–32)
Calcium: 9.6 mg/dL (ref 8.4–10.5)
Chloride: 99 mmol/L (ref 96–112)
Creatinine, Ser: 0.78 mg/dL (ref 0.50–1.10)
GFR calc Af Amer: 86 mL/min — ABNORMAL LOW (ref >90)
GFR calc non Af Amer: 74 mL/min — ABNORMAL LOW (ref >90)
GLUCOSE: 96 mg/dL (ref 70–99)
POTASSIUM: 4.7 mmol/L (ref 3.5–5.1)
SODIUM: 134 mmol/L — AB (ref 135–145)
Total Bilirubin: 0.4 mg/dL (ref 0.3–1.2)
Total Protein: 6.5 g/dL (ref 6.0–8.3)

## 2015-02-20 LAB — CBC WITH DIFFERENTIAL/PLATELET
BASOS PCT: 1 % (ref 0–1)
Basophils Absolute: 0 10*3/uL (ref 0.0–0.1)
Eosinophils Absolute: 0.2 10*3/uL (ref 0.0–0.7)
Eosinophils Relative: 4 % (ref 0–5)
HEMATOCRIT: 28.7 % — AB (ref 36.0–46.0)
Hemoglobin: 9.2 g/dL — ABNORMAL LOW (ref 12.0–15.0)
LYMPHS PCT: 15 % (ref 12–46)
Lymphs Abs: 0.8 10*3/uL (ref 0.7–4.0)
MCH: 29.8 pg (ref 26.0–34.0)
MCHC: 32.1 g/dL (ref 30.0–36.0)
MCV: 92.9 fL (ref 78.0–100.0)
MONO ABS: 0.8 10*3/uL (ref 0.1–1.0)
Monocytes Relative: 14 % — ABNORMAL HIGH (ref 3–12)
NEUTROS PCT: 66 % (ref 43–77)
Neutro Abs: 3.7 10*3/uL (ref 1.7–7.7)
PLATELETS: 292 10*3/uL (ref 150–400)
RBC: 3.09 MIL/uL — ABNORMAL LOW (ref 3.87–5.11)
RDW: 12.7 % (ref 11.5–15.5)
WBC: 5.5 10*3/uL (ref 4.0–10.5)

## 2015-02-20 LAB — SEDIMENTATION RATE: Sed Rate: 55 mm/hr — ABNORMAL HIGH (ref 0–22)

## 2015-02-26 ENCOUNTER — Encounter (HOSPITAL_COMMUNITY)
Admission: RE | Admit: 2015-02-26 | Discharge: 2015-02-26 | Disposition: A | Discharge status: Home / Self Care | Payer: Medicare Other | Source: Skilled Nursing Facility | Attending: Internal Medicine | Admitting: Internal Medicine

## 2015-02-26 ENCOUNTER — Non-Acute Institutional Stay (SKILLED_NURSING_FACILITY): Payer: Medicare Other | Admitting: Internal Medicine

## 2015-02-26 DIAGNOSIS — R350 Frequency of micturition: Secondary | ICD-10-CM | POA: Diagnosis not present

## 2015-02-26 DIAGNOSIS — D649 Anemia, unspecified: Secondary | ICD-10-CM

## 2015-02-26 LAB — CBC WITH DIFFERENTIAL/PLATELET
BASOS PCT: 0 % (ref 0–1)
Basophils Absolute: 0 10*3/uL (ref 0.0–0.1)
Eosinophils Absolute: 0.2 10*3/uL (ref 0.0–0.7)
Eosinophils Relative: 3 % (ref 0–5)
HCT: 26.8 % — ABNORMAL LOW (ref 36.0–46.0)
HEMOGLOBIN: 8.6 g/dL — AB (ref 12.0–15.0)
Lymphocytes Relative: 11 % — ABNORMAL LOW (ref 12–46)
Lymphs Abs: 0.7 10*3/uL (ref 0.7–4.0)
MCH: 29.5 pg (ref 26.0–34.0)
MCHC: 32.1 g/dL (ref 30.0–36.0)
MCV: 91.8 fL (ref 78.0–100.0)
Monocytes Absolute: 0.9 10*3/uL (ref 0.1–1.0)
Monocytes Relative: 14 % — ABNORMAL HIGH (ref 3–12)
NEUTROS ABS: 4.5 10*3/uL (ref 1.7–7.7)
Neutrophils Relative %: 72 % (ref 43–77)
Platelets: 198 10*3/uL (ref 150–400)
RBC: 2.92 MIL/uL — ABNORMAL LOW (ref 3.87–5.11)
RDW: 12.9 % (ref 11.5–15.5)
WBC: 6.2 10*3/uL (ref 4.0–10.5)

## 2015-03-01 NOTE — Progress Notes (Addendum)
Patient ID: Kerry Flynn, female   DOB: [birth year], 81 y.o.   MRN: [id number]                PROGRESS NOTE  DATE:  02/26/2015             FACILITY: [facility]                         LEVEL OF CARE:   SNF   Acute Visit                           CHIEF COMPLAINT:  Urinary frequency, anemia.    HISTORY OF PRESENT ILLNESS:  This is a frail, 81 year-old woman who suffered a closed left hip fracture.  She underwent a left hip arthroplasty on 01/31/2015.    As far as I can tell, her hemoglobin was reasonably normal preoperatively.  However, her hemoglobin has continued to fall throughout her stay here.  This was 9.6 on 02/09/2015, 9.2 on 02/20/2015, and 8.6 today.  The rest of her counts are normal.  Differential count is normal.    We put her on iron.  She has been refusing this due to GI intolerance.  She is still on Lovenox until 03/02/2015.    With regards to her anemia, it would appear that she is chronically anemic.  This is normochromic, normocytic and dates back through the surgery.  Still, most of her anemia is probably blood loss at the time of the actual surgery itself.    We did a bladder scan that did not show any residual urine.  I put her on Myrbetriq on 02/10/2015.  She says this has not helped at all.    PHYSICAL EXAMINATION:   GENERAL APPEARANCE:  The patient does not look to be in any distress.   CHEST/RESPIRATORY:  Clear air entry bilaterally.    CARDIOVASCULAR:   CARDIAC:  Heart sounds are normal.  There are no murmurs.    GASTROINTESTINAL:   ABDOMEN:  Soft, nontender.   GENITOURINARY:   BLADDER:  Not distended.  There is no CVA tenderness.    ASSESSMENT/PLAN:                              Urinary frequency.  She does not have urinary retention.  I will increase the Myrbetriq to 50 p.o. q.d.   Failing anything here, I will try an anticholinergic.    Normochromic, normocytic anemia.  She is not tolerating oral iron.  I am going to stop this and the  Lovenox.  Follow up her CBC next Monday.  Her pre-admission hemoglobin seems to have been around 10.4.  This was worked up in the hospital with an iron panel, et

## 2015-03-07 ENCOUNTER — Other Ambulatory Visit (HOSPITAL_COMMUNITY)
Admission: AD | Admit: 2015-03-07 | Discharge: 2015-03-07 | Disposition: A | Discharge status: Home / Self Care | Payer: Medicare Other | Source: Skilled Nursing Facility | Attending: Internal Medicine | Admitting: Internal Medicine

## 2015-03-07 LAB — CBC
HCT: 28.9 % — ABNORMAL LOW (ref 36.0–46.0)
Hemoglobin: 9.4 g/dL — ABNORMAL LOW (ref 12.0–15.0)
MCH: 29.8 pg (ref 26.0–34.0)
MCHC: 32.5 g/dL (ref 30.0–36.0)
MCV: 91.7 fL (ref 78.0–100.0)
PLATELETS: 199 10*3/uL (ref 150–400)
RBC: 3.15 MIL/uL — AB (ref 3.87–5.11)
RDW: 13.4 % (ref 11.5–15.5)
WBC: 5.3 10*3/uL (ref 4.0–10.5)

## 2015-03-11 ENCOUNTER — Ambulatory Visit: Payer: Medicare Other | Admitting: Orthopedic Surgery

## 2015-03-14 ENCOUNTER — Non-Acute Institutional Stay (SKILLED_NURSING_FACILITY): Payer: Medicare Other | Admitting: Internal Medicine

## 2015-03-14 ENCOUNTER — Encounter: Payer: Self-pay | Admitting: Internal Medicine

## 2015-03-14 DIAGNOSIS — D508 Other iron deficiency anemias: Secondary | ICD-10-CM | POA: Diagnosis not present

## 2015-03-14 DIAGNOSIS — S72002D Fracture of unspecified part of neck of left femur, subsequent encounter for closed fracture with routine healing: Secondary | ICD-10-CM | POA: Diagnosis not present

## 2015-03-14 DIAGNOSIS — R350 Frequency of micturition: Secondary | ICD-10-CM | POA: Diagnosis not present

## 2015-03-14 DIAGNOSIS — I10 Essential (primary) hypertension: Secondary | ICD-10-CM | POA: Diagnosis not present

## 2015-03-14 NOTE — Progress Notes (Signed)
Patient ID: Kerry Flynn, female   DOB: [birth year], 81 y.o.   MRN: [id number]   this is a discharge note.  Level care skilled.  Facility [facility].   CHIEF COMPLAINT:  Discharge note    HISTORY OF PRESENT ILLNESS:  This is an 81 year-old woman who apparently had a fall three days prior to admission.to hospital  There apparently was some thought about loss of consciousness, .  She apparently ambulated on the hip for a period of time after that, but was unable to get up when she went to the toilet.  EMS was called.  She had been taking Lasix for edema in her legs and it was felt that might have had something to do with her syncope/presyncope.  She underwent a left hip arthroplasty.  She appears to have tolerated this well.  She had mildly low sodiums and postoperative bleeding, but no other major issues described.    The patient lives at home in [place] with her husband.  She uses a walker.  They have a split-level home.  They have a ramp/elevator for the stairs in their home.    Several weeks ago the possibility of discharge was considered-patient wanted to be at home with her husband who apparently is undergoing treatment for cancer.  However she was quite weak and it was decided it would be in her best interest to stay a bit longer physical therapy also was uncomfortable with her going home at this time.  She has continued to gain strength now is ambulating more in her walker-she is receiving Norco as well as Mobic as needed for pain appears to be tolerating this well.  Her Myrbetriq was recently increased secondary to complains of urinary frequency she is not really complaining of this today.--Apparently this has been a long-term issue  She also has anemia this was thought to be largely possibly blood loss from her surgery-this appears to have rebounded some on March 11 was 9.4 this is up from the mid eights on the previous reading-it appears she is receiving iron once a day-her  Lovenox has been discontinued.       PAST MEDICAL HISTORY/PROBLEM LIST:                            Hemorrhoids.    Intermittent vertigo.    Known osteoporosis, with a recent bone density.    Vitamin D deficiency.    Gastroesophageal reflux disease.    Diverticulosis.    Osteoarthritis of the neck and osteoarthritis of the L-spine, according to the patient, and also sacroiliac disease.     Hypertension.    Barrett's esophagus.    Peripheral edema.    Chronic knee pain.    Fatigue.    PAST SURGICAL HISTORY:          Knee arthroscopy remotely.    Cholecystectomy.     Rotator cuff tear and repair on the right.    Cataract extraction.    Colonoscopy in April 2013.    Tonsillectomy and adenoidectomy.    Palette surgery.    Dental implant.    CURRENT MEDICATIONS:  Discharge medications include:       Lovenox 30 mg daily for DVT prophylaxis.    Norco 5/325, 1 tablet every 6 hours p.r.n.       Robaxin 500 q.6 hours p.r.n.      Magnesium oxide 500 mg daily for constipation.    Norvasc 5  q.d.      Calcium/vitamin D/vitamin K 1 tablet b.i.d.      Colace 100 b.i.d.       Mobic 7.5 daily.    KCl 10 mEq daily.     Probiotic 1 capsule daily.    Altace 10 q.d.      Tylenol 500 two times daily p.r.n.           Myrbertric 35 mg QD  Antivert 12.5 as needed for vertigo.    Nitroglycerin 0.4 sublingual q.5 minutes p.r.n. (presumably has coronary artery disease, although I do not see this listed).        Omeprazole 40 mg b.i.d., again as needed.     Her Naprosyn and Lasix were put on hold in the hospital.     SOCIAL HISTORY:                   HOUSING:  The patient lives in [place] in her own split-level with her husband.   FUNCTIONAL STATUS:  She uses a walker.  It sounds as though they need a fair amount of help in the home, although the patient states she was independent with ADLs.  Marland Kitchen    REVIEW OF SYSTEMS: General no complaints of fever chills  feel she is getting stronger but still has significant weakness lower extremities.  Skin does not complain of rashes or itching.  Head ears eyes nose mouth and throat -- prescription lenses does not complain of visual changes.            CHEST/RESPIRATORY:  No shortness of breath.   CARDIAC:  No chest pain.     GI:   Does not complain of nausea vomiting diarrhea constipation does not have abdominal pain.   M- skeletal- She has gained strength does not complain of acute joint pain does have occasional left hip pain that is relieved with the Norco and Mobic.  Neurologic is not complaining of dizziness or headache or syncopal-type feelings   PHYSICAL EXAMINATION:   VITAL SIGNS:   Temperature 97.7 pulse 73 respirations 20 blood pressure 139/55 this all appears relatively stable.    GENERAL APPEARANCE:  Frail elderly female in no distress sitting comfortably in her wheelchair  HEENT:   MOUTH/THROAT:  Oral exam is normal mucous membranes moist.    CHEST/RESPIRATORY:  Kyphotic, but clear air entry.    CARDIOVASCULAR:   CARDIAC:  Soft systolic murmur.  She appears to be euvolemic has very mild ankle edema.   GASTROINTESTINAL:   LIVER/SPLEEN/KIDNEYS:   No liver, no spleen.  No tenderness. --Positive bowel sounds    MUSCULOSKELETAL:   EXTREMITIES:  She has degenerative changes in her hands, both knees.  Very significant issues here.  --She moves all extremities -he is able to stand without assistance and use her walker she is somewhat weak with this but has made significant progress from when I last saw her  Surgical site left hip appears well-healed NEUROLOGICAL:    Grossly intact her speech is clear no lateralizing findings.   PSYCHIATRIC:   MENTAL STATUS:  She is alert, conversational. Pleasant and talkative.  Labs.  03/07/2015.  WBC 5.3 hemoglobin 9.4 platelets 197  Feb-25th 2016.  Sodium 134 potassium 4.7 BUN 28 creatinine 0.78.  Liver functions as within normal limits  except albumin of 3.2   02/04/2015.  Sodium 136 potassium 4.2 BUN 15 creatinine 0.64.  Albumin 2.6 AST 49 otherwise liver function tests within normal limits.  WBC 6.7 hemoglobin 9.6 platelets  202.  Sedimentation rate 1:15.      ASSESSMENT/PLAN:                                        Status post left hip arthroplasty secondary to a fracture.   She has gained strength although still has some weakness will need continued PT and OT-she does have a rolling walker at home-pain at this point appears controlled with the Norco and Mobic mainly        Osteoporosis. She is on calcium supplementation    History of vitamin D deficiency, although she is not on vitamin D.  Her vitamin D level was recently checked at 68.    Normochromic, normocytic anemia.  This dates back before the surgery this appears to be trending back up-she is on iron-Initially  she complained of intolerance to this and it was discontinued however it's been restarted and she apparently is tolerating it now--will have home health draw a CBC on Monday for follow-up by primary care provider later in the week  A sedimentation rate at 115 raised a lot of different issues, although she has recently just had surgery which might affect this.  per Dr. Janalyn Rouse assessment would wonder about myeloma, some form of inflammatory arthritis on top of her osteoarthritis, and/or polymyalgia rheumatica.--- This subsequently has come down to a sedimentation rate of 55-- this may warrant follow up by primary care provider as needed-   Bowel and bladder incontinence. May all be functional, Dr. Dellia Nims has started her on Myrbertric--  This will have to be monitored-  Hypertension this appears to be relatively stable on Altace and Norvasc recent blood pressures 139/55-113/41-118/41  Some history of hypernatremia-this appears to have been relatively stable during her stay here update metabolic panel by home health on Monday notify primary care  provider of results.  [id number] note more than 30 minutes spent on this discharge summary      .

## 2015-03-17 ENCOUNTER — Encounter: Payer: Self-pay | Admitting: Orthopedic Surgery

## 2015-03-17 ENCOUNTER — Ambulatory Visit (INDEPENDENT_AMBULATORY_CARE_PROVIDER_SITE_OTHER): Payer: Self-pay | Admitting: Orthopedic Surgery

## 2015-03-17 DIAGNOSIS — S72002D Fracture of unspecified part of neck of left femur, subsequent encounter for closed fracture with routine healing: Secondary | ICD-10-CM

## 2015-03-17 DIAGNOSIS — Z96649 Presence of unspecified artificial hip joint: Secondary | ICD-10-CM

## 2015-03-17 DIAGNOSIS — Z9660 Presence of unspecified orthopedic joint implant: Secondary | ICD-10-CM

## 2015-03-17 NOTE — Progress Notes (Signed)
Postoperative note the patient is status post left femoral neck fracture treated with bipolar replacement on 01/31/2015 doing well currently has been released back to home she is to receive therapy at home she is on a walker she should continue  Her leg lengths are equal her hip flexion is 125 without pain  She has no peripheral edema or swelling at the ankle  No calf tenderness  She can return to [place] in 6 weeks she should continue physical therapy at home and then transition to outpatient on as-needed basis

## 2015-03-18 ENCOUNTER — Ambulatory Visit: Payer: Medicare Other | Admitting: Nurse Practitioner

## 2015-03-18 DIAGNOSIS — R270 Ataxia, unspecified: Secondary | ICD-10-CM | POA: Diagnosis not present

## 2015-03-18 DIAGNOSIS — W19XXXD Unspecified fall, subsequent encounter: Secondary | ICD-10-CM | POA: Diagnosis not present

## 2015-03-18 DIAGNOSIS — S7292XD Unspecified fracture of left femur, subsequent encounter for closed fracture with routine healing: Secondary | ICD-10-CM | POA: Diagnosis not present

## 2015-03-18 DIAGNOSIS — G8929 Other chronic pain: Secondary | ICD-10-CM | POA: Diagnosis not present

## 2015-03-19 ENCOUNTER — Ambulatory Visit: Payer: Self-pay | Admitting: *Deleted

## 2015-03-19 ENCOUNTER — Encounter: Payer: Self-pay | Admitting: *Deleted

## 2015-03-19 ENCOUNTER — Ambulatory Visit: Payer: Medicare Other | Admitting: Family Medicine

## 2015-03-19 NOTE — Patient Outreach (Signed)
Mrs. Blaney husband has been moved to [facility] for end of life care. Today's appointment cancelled. Will reschedule at appropriate time. Telephone outreach next week.

## 2015-03-24 ENCOUNTER — Encounter (INDEPENDENT_AMBULATORY_CARE_PROVIDER_SITE_OTHER): Payer: Self-pay | Admitting: *Deleted

## 2015-03-25 ENCOUNTER — Ambulatory Visit (INDEPENDENT_AMBULATORY_CARE_PROVIDER_SITE_OTHER): Payer: Medicare Other | Admitting: Family Medicine

## 2015-03-25 ENCOUNTER — Encounter: Payer: Self-pay | Admitting: Family Medicine

## 2015-03-25 VITALS — BP 110/64 | HR 78 | Resp 18 | Ht 66.0 in | Wt 140.1 lb

## 2015-03-25 DIAGNOSIS — F32A Depression, unspecified: Secondary | ICD-10-CM | POA: Insufficient documentation

## 2015-03-25 DIAGNOSIS — D508 Other iron deficiency anemias: Secondary | ICD-10-CM

## 2015-03-25 DIAGNOSIS — F329 Major depressive disorder, single episode, unspecified: Secondary | ICD-10-CM | POA: Diagnosis not present

## 2015-03-25 DIAGNOSIS — R55 Syncope and collapse: Secondary | ICD-10-CM

## 2015-03-25 DIAGNOSIS — I10 Essential (primary) hypertension: Secondary | ICD-10-CM

## 2015-03-25 DIAGNOSIS — M545 Low back pain, unspecified: Secondary | ICD-10-CM

## 2015-03-25 DIAGNOSIS — M25561 Pain in right knee: Secondary | ICD-10-CM

## 2015-03-25 DIAGNOSIS — N3941 Urge incontinence: Secondary | ICD-10-CM

## 2015-03-25 MED ORDER — HYDROCODONE-ACETAMINOPHEN 5-325 MG PO TABS: ORAL_TABLET | ORAL | Status: DC

## 2015-03-25 MED ORDER — MIRTAZAPINE 7.5 MG PO TABS: 7.5000 mg | ORAL_TABLET | Freq: Every day | ORAL | Status: DC

## 2015-03-25 MED ORDER — RAMIPRIL 5 MG PO CAPS: 5.0000 mg | ORAL_CAPSULE | Freq: Every day | ORAL | Status: DC

## 2015-03-25 NOTE — Progress Notes (Signed)
   Subjective:    Patient ID: Kerry Flynn, female    DOB: [birth year], 81 y.o.   MRN: [id number]  HPI   TENEIL SHILLER     MRN: [id number]      DOB: 07-21-1927   HPI Ms. Odeh is here for follow up and re-evaluation of chronic medical conditions, medication management and review of any available recent lab and radiology data.  Preventive health is updated, specifically  Cancer screening and Immunization.   Unfortunately, since her last visit , Biviana  Sustained a hip fracture which required hospitalization and subsequent SNF placement prior to her ability to return to her home and ailing husband , who unfortunately passed away before she could actually return, she is extremely sad , poor appetite and poor sleep, they had no children, however , a niece form [place] is temporarily with her and is trying her best to convince Ms Washinton of her need to relocate. 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 Denies sinus pressure, nasal congestion, ear pain or sore throat. Denies chest congestion, productive cough or wheezing. Denies chest pains, palpitations and leg swelling Denies abdominal pain, nausea, vomiting,diarrhea or constipation.   Denies dysuria, reports improved  Incontinence on current medication  Denies headaches, seizures, numbness, or tingling. . Denies skin break down or rash.   PE  BP 110/64 mmHg  Pulse 78  Resp 18  Ht 5\' 6"  (1.676 m)  Wt 140 lb 1.9 oz (63.558 kg)  BMI 22.63 kg/m2  SpO2 99%  Patient alert and oriented and in no cardiopulmonary distress.  HEENT: No facial asymmetry, EOMI,   oropharynx pink and moist.  Neck decreased  no JVD, no mass.  Chest: Clear to auscultation bilaterally.  CVS: S1, S2 no murmurs, no S3.Regular rate.  ABD: Soft non tender.   Ext: No edema  MS: decreased  ROM spine, shoulders, hips and knees.Marked kyphosis of thoracic spine  Skin:  Intact, no ulcerations or rash noted.  Psych: Good eye contact, sad affect. Memoryimildly impaired  tearful and depressed appearing.  CNS: CN 2-12 intact, power,  normal throughout.no focal deficits noted.   Assessment & Plan   Essential hypertension Over corrected and symptomatic, reduce altace dose    Urinary incontinence, urge Good response to current med .Continue same   Depression Acute grief, with recent loss of spouse, fracture requiring short term nursing home placement, poor appetite and sleep is also disturbed, start remron Pt encouraged strongly to move to [place] with her niece who is here short term, she is concerned about organizing her personal estate prior to leaving   Anemia Daily iron supplement and f/u lab needed   Lumbar back pain Judicious use of pain medication for uncontrolled pain, pain contract signed at visit for once daily hydrocodone as needed   Faintness Symptomatic esp with change in position, reduce dose of altace        Review of Systems     Objective:   Physical Exam        Assessment & Plan:

## 2015-03-25 NOTE — Patient Instructions (Addendum)
F/u in 5 weeks, call if you need me before  Our condolence and support at this difficult time  Lower dose of altace 5mg  one daily for blood pressure, start tomorrow  One hydrocodone tablet once daily, sign pain contract  Remeron 1 at bedtime to help with sleep, appetite and mild depression, which is expected , I know you will get better over time  You are not safe to stay by yourself, the home and love of your family in [place], is where you need to be, you will get through what you need to do, so that you can   Move down with your cat in the near future  CBC, iron,  Ferritin and chem 7 in 2 week by home health nurse

## 2015-03-26 ENCOUNTER — Telehealth: Payer: Self-pay | Admitting: Family Medicine

## 2015-03-26 ENCOUNTER — Other Ambulatory Visit: Payer: Self-pay

## 2015-03-26 ENCOUNTER — Other Ambulatory Visit: Payer: Self-pay | Admitting: Family Medicine

## 2015-03-26 DIAGNOSIS — F32A Depression, unspecified: Secondary | ICD-10-CM

## 2015-03-26 DIAGNOSIS — F329 Major depressive disorder, single episode, unspecified: Secondary | ICD-10-CM

## 2015-03-26 MED ORDER — MIRTAZAPINE 7.5 MG PO TABS: 7.5000 mg | ORAL_TABLET | Freq: Every day | ORAL | Status: DC

## 2015-03-26 NOTE — Telephone Encounter (Signed)
Spoke with patient and she is aware that meds have been sent remeron and ramipril.

## 2015-03-27 ENCOUNTER — Other Ambulatory Visit: Payer: Self-pay | Admitting: *Deleted

## 2015-03-27 NOTE — Patient Outreach (Signed)
I called Mrs. Duffner today to reconnect with her about [facility] [employer] services. She was first referred to [facility]  on 01/06/15 by Dr. Tula Nakayama. Mrs. Rupard was unable to keep our initial visit appointment on 1/25 because of weather conditions/safety concerns. Unfortunately, she fell and sustained a fracture a few days later and was hospitalized. After her hospitalization, Mrs. Jacquot transferred to [facility] for short term rehab. She was discharged from [facility] to home on 03/14/15. On 03/16/15, Mrs. Nunley's husband fell at home and was hospitalized. The following day he was transferred to [facility]. Mrs. Cuadras reports today that she is receiving home health services including physical therapy from [facility]. Mrs. Bayle would like for me to return a call to her next week and schedule a visit thereafter.

## 2015-04-01 ENCOUNTER — Ambulatory Visit: Payer: Medicare Other | Admitting: Orthopedic Surgery

## 2015-04-04 ENCOUNTER — Other Ambulatory Visit: Payer: Self-pay | Admitting: Internal Medicine

## 2015-04-08 ENCOUNTER — Other Ambulatory Visit: Payer: Self-pay | Admitting: Internal Medicine

## 2015-04-11 ENCOUNTER — Other Ambulatory Visit: Payer: Self-pay | Admitting: Family Medicine

## 2015-04-18 ENCOUNTER — Encounter: Payer: Self-pay | Admitting: *Deleted

## 2015-04-19 NOTE — Assessment & Plan Note (Addendum)
Good response to current med .Continue same

## 2015-04-19 NOTE — Assessment & Plan Note (Signed)
Over corrected and symptomatic, reduce altace dose

## 2015-04-19 NOTE — Assessment & Plan Note (Signed)
Acute grief, with recent loss of spouse, fracture requiring short term nursing home placement, poor appetite and sleep is also disturbed, start remron Pt encouraged strongly to move to [place] with her niece who is here short term, she is concerned about organizing her personal estate prior to leaving

## 2015-04-19 NOTE — Assessment & Plan Note (Signed)
Symptomatic esp with change in position, reduce dose of altace

## 2015-04-19 NOTE — Assessment & Plan Note (Signed)
Judicious use of pain medication for uncontrolled pain, pain contract signed at visit for once daily hydrocodone as needed

## 2015-04-19 NOTE — Assessment & Plan Note (Signed)
Daily iron supplement and f/u lab needed

## 2015-04-21 ENCOUNTER — Other Ambulatory Visit: Payer: Self-pay | Admitting: *Deleted

## 2015-04-21 NOTE — Patient Outreach (Signed)
Call to patient to set up hv Spoke with patient, she has several appointments this week and next. Scheduled outreach hv for 05/02/15 Reviewed that she would get reminder call regarding appointment. Also, gave RNCM contact information in case she needed to call with any questions, issues or to change our appointment.   Royetta Crochet. Laymond Purser, RN, BSN, [place] [phone]

## 2015-04-29 ENCOUNTER — Ambulatory Visit (INDEPENDENT_AMBULATORY_CARE_PROVIDER_SITE_OTHER): Payer: Medicare Other | Admitting: Orthopedic Surgery

## 2015-04-29 ENCOUNTER — Encounter: Payer: Self-pay | Admitting: Orthopedic Surgery

## 2015-04-29 ENCOUNTER — Ambulatory Visit: Payer: Medicare Other | Admitting: Family Medicine

## 2015-04-29 VITALS — BP 152/74 | Ht 66.0 in | Wt 140.1 lb

## 2015-04-29 DIAGNOSIS — Z9660 Presence of unspecified orthopedic joint implant: Secondary | ICD-10-CM | POA: Diagnosis not present

## 2015-04-29 DIAGNOSIS — M170 Bilateral primary osteoarthritis of knee: Secondary | ICD-10-CM

## 2015-04-29 DIAGNOSIS — Z96649 Presence of unspecified artificial hip joint: Secondary | ICD-10-CM

## 2015-04-29 NOTE — Progress Notes (Signed)
Patient ID: Kerry Flynn, female   DOB: [birth year], 81 y.o.   MRN: [id number] Chief Complaint  Patient presents with  . Follow-up    6 week post op Left bilpolar hip, DOS 01/31/15    Postop visit  Within the 90 day.  Patient is doing well she is walking with a rolling walker she's eager to drive. She is okay from a surgical point to drive either. I did advise that she have some one with her when she drives  Hip flexion is excellent. She does have some peripheral edema  As we were going through our office visit she complained of chronic bilateral knee pain for which she has had injections in the past including hyaluronic acid.  Further history indicates that she is had multiple injections.  Review of systems peripheral edema  Exam related to her bilateral knee pain her appearance is normal she has a mesomorphic body habitus she is alert and oriented 3 mood and affect are normal she has bilateral peripheral edema chronic her knee flexion angles are 100 on the left 105 on the right she has slight flexion contractures without other deformity  New problem  Injection  In the postop period modifier 24   Procedure note left knee injection verbal consent was obtained to inject left knee joint  Timeout was completed to confirm the site of injection  The medications used were 40 mg of Depo-Medrol and 1% lidocaine 3 cc  Anesthesia was provided by ethyl chloride and the skin was prepped with alcohol.  After cleaning the skin with alcohol a 20-gauge needle was used to inject the left knee joint. There were no complications. A sterile bandage was applied.   Procedure note right knee injection verbal consent was obtained to inject right knee joint  Timeout was completed to confirm the site of injection  The medications used were 40 mg of Depo-Medrol and 1% lidocaine 3 cc  Anesthesia was provided by ethyl chloride and the skin was prepped with alcohol.  After cleaning the skin with  alcohol a 20-gauge needle was used to inject the right knee joint. There were no complications. A sterile bandage was applied.

## 2015-04-29 NOTE — Patient Instructions (Signed)
Joint Injection  Care After  Refer to this sheet in the next few days. These instructions provide you with information on caring for yourself after you have had a joint injection. Your caregiver also may give you more specific instructions. Your treatment has been planned according to current medical practices, but problems sometimes occur. Call your caregiver if you have any problems or questions after your procedure.  After any type of joint injection, it is not uncommon to experience:  · Soreness, swelling, or bruising around the injection site.  · Mild numbness, tingling, or weakness around the injection site caused by the numbing medicine used before or with the injection.  It also is possible to experience the following effects associated with the specific agent after injection:  · Iodine-based contrast agents:  ¨ Allergic reaction (itching, hives, widespread redness, and swelling beyond the injection site).  · Corticosteroids (These effects are rare.):  ¨ Allergic reaction.  ¨ Increased blood sugar levels (If you have diabetes and you notice that your blood sugar levels have increased, notify your caregiver).  ¨ Increased blood pressure levels.  ¨ Mood swings.  · Hyaluronic acid in the use of viscosupplementation.  ¨ Temporary heat or redness.  ¨ Temporary rash and itching.  ¨ Increased fluid accumulation in the injected joint.  These effects all should resolve within a day after your procedure.   HOME CARE INSTRUCTIONS  · Limit yourself to light activity the day of your procedure. Avoid lifting heavy objects, bending, stooping, or twisting.  · Take prescription or over-the-counter pain medication as directed by your caregiver.  · You may apply ice to your injection site to reduce pain and swelling the day of your procedure. Ice may be applied 03-04 times:  ¨ Put ice in a plastic bag.  ¨ Place a towel between your skin and the bag.  ¨ Leave the ice on for no longer than 15-20 minutes each time.  SEEK  IMMEDIATE MEDICAL CARE IF:   · Pain and swelling get worse rather than better or extend beyond the injection site.  · Numbness does not go away.  · Blood or fluid continues to leak from the injection site.  · You have chest pain.  · You have swelling of your face or tongue.  · You have trouble breathing or you become dizzy.  · You develop a fever, chills, or severe tenderness at the injection site that last longer than 1 day.  MAKE SURE YOU:  · Understand these instructions.  · Watch your condition.  · Get help right away if you are not doing well or if you get worse.  Document Released: 08/26/2011 Document Revised: 03/06/2012 Document Reviewed: 08/26/2011  Exit[employer]® Patient Information ©2015 [employer]. This information is not intended to replace advice given to you by your health care provider. Make sure you discuss any questions you have with your health care provider.

## 2015-05-02 ENCOUNTER — Other Ambulatory Visit: Payer: Self-pay | Admitting: Internal Medicine

## 2015-05-02 ENCOUNTER — Telehealth: Payer: Self-pay | Admitting: Family Medicine

## 2015-05-02 ENCOUNTER — Other Ambulatory Visit: Payer: Self-pay | Admitting: *Deleted

## 2015-05-02 MED ORDER — AMLODIPINE BESYLATE 5 MG PO TABS: 5.0000 mg | ORAL_TABLET | Freq: Every day | ORAL | Status: DC

## 2015-05-02 NOTE — Telephone Encounter (Signed)
Patient aware that [facility] said rx is ready

## 2015-05-02 NOTE — Patient Outreach (Signed)
[facility]) Care Management   05/02/2015  Kerry Flynn 1927-08-12 [id number]  Kerry Flynn is an 81 y.o. female  Subjective: Patient reports goal is: "To be independent"   Patient wants to be able to drive and go shopping alone.  Patient reports she has MD appointment next week, will ask about edema in legs Patient reports she had a fall a week ago, she had EMS come and pick her up, she has lifeline around her neck, but did not use it, called her caregiver, Leafy Ro, who called EMS for non emergency pick up.  Patient reports she is not making any rash decisions about her future at this time, she is still grieving the loss of her spouse. She has a caregiver during the day and at night, has some time without a caregiver. Patient voiced she may consider Assisted living or Independent apartment in the future.  Patient had hip fracture earlier this year, Ortho released her from care and gave her permission to drive is she has someone in car with her. Ortho did put some type of injection in knees for cartilage, she plans to follow up on this with ortho in a few months, no appointment on calendar.  Patient considering going to outpatient rehab for more therapy as HH has discontinued.  Patient has steps to use to go up to bedroom and bathroom, either uses a stair lift chair or her cane.   Objective:   Review of Systems  Constitutional: Negative.   HENT: Negative.   Cardiovascular: Positive for leg swelling.  Musculoskeletal: Positive for falls.       [address] a week ago in the bathroom  Skin: Negative.     Physical Exam  Cardiovascular: Normal rate.   Respiratory: Effort normal and breath sounds normal.  GI: Soft.  Musculoskeletal: She exhibits edema.  Skin: Skin is warm and dry.    Current Medications:   Current Outpatient Prescriptions  Medication Sig Dispense Refill  . Calcium-Vitamin D-Vitamin K 500-100-40 MG-UNT-MCG CHEW Chew 1 tablet by mouth 2 (two) times daily.      Marland Kitchen docusate sodium (COLACE) 100 MG capsule Take 100 mg by mouth 3 (three) times daily as needed (constipation).     . ferrous sulfate 325 (65 FE) MG tablet TAKE 1 TABLET BY MOUTH EVERY DAY 30 tablet 1  . Magnesium Oxide (PHILLIPS) 500 MG (LAX) TABS Take 1 tablet (500 mg total) by mouth daily as needed.  0  . meloxicam (MOBIC) 7.5 MG tablet Take 1 tablet (7.5 mg total) by mouth daily. 30 tablet 3  . mirtazapine (REMERON) 7.5 MG tablet Take 1 tablet (7.5 mg total) by mouth at bedtime. 30 tablet 2  . Misc Natural Products (COSAMIN ASU ADVANCED FORMULA) CAPS Take 1 capsule by mouth 3 (three) times daily with meals.     . Multiple Vitamin (MULTIVITAMIN) tablet Take 1 tablet by mouth daily.      Marland Kitchen MYRBETRIQ 50 MG TB24 tablet TAKE 1 TABLET BY MOUTH EVERY DAY 30 tablet 0  . naproxen sodium (ANAPROX) 220 MG tablet Take 220 mg by mouth 2 (two) times daily with a meal.    . nitroGLYCERIN (NITROSTAT) 0.4 MG SL tablet Place 0.4 mg under the tongue every 5 (five) minutes as needed. 1 tablet under the tongue at onset of chest pain: you may repeat every 5 minutes for up to 3 doses    . omeprazole (PRILOSEC OTC) 20 MG tablet Take 40 mg by mouth 2 (two) times  daily as needed (acid reflux).     . Probiotic Product (HEALTHY COLON) CAPS Take 1 capsule by mouth daily.     . ramipril (ALTACE) 5 MG capsule Take 1 capsule (5 mg total) by mouth daily. 30 capsule 3  . Wheat Dextrin (BENEFIBER) POWD Take 2 scoop by mouth 2 (two) times daily.     Marland Kitchen amLODipine (NORVASC) 5 MG tablet Take 1 tablet (5 mg total) by mouth daily. 30 tablet 3  . HYDROcodone-acetaminophen (NORCO/VICODIN) 5-325 MG per tablet One tablet once daily (Patient not taking: Reported on 05/02/2015) 30 tablet 0  . meclizine (ANTIVERT) 12.5 MG tablet Take 1 tablet (12.5 mg total) by mouth as needed. vertigo (Patient not taking: Reported on 05/02/2015) 30 tablet 0   No current facility-administered medications for this visit.    Functional Status:   In your  present state of health, do you have any difficulty performing the following activities: 05/02/2015 02/03/2015  Hearing? N -  Vision? Y -  Difficulty concentrating or making decisions? N -  Walking or climbing stairs? Y -  Dressing or bathing? N -  Doing errands, shopping? Y N  Preparing Food and eating ? Y -  Using the Toilet? N -  In the past six months, have you accidently leaked urine? Y -  Do you have problems with loss of bowel control? N -  Managing your Medications? Y -  Managing your Finances? Y -  Housekeeping or managing your Housekeeping? Y -    Fall/Depression Screening:    PHQ 2/9 Scores 05/02/2015 04/04/2014  PHQ - 2 Score 2 0  PHQ- 9 Score 2 -    Assessment:   Called MD office re: refill on amlodipine Gave blue calendar, reviewed area of Hypertension signs and symptoms with patient and caregiver, Garment/textile technologist education given, patient is fall risk and has had falls this past year.  Reviewed goals with patient and what "independent" looks like to her.   Plan:  Forward visit note to MD Return for routine visit in June   [name], RN, BSN, [place] [phone]

## 2015-05-05 ENCOUNTER — Encounter: Payer: Self-pay | Admitting: *Deleted

## 2015-05-05 ENCOUNTER — Ambulatory Visit (INDEPENDENT_AMBULATORY_CARE_PROVIDER_SITE_OTHER): Payer: Medicare Other | Admitting: Family Medicine

## 2015-05-05 VITALS — BP 128/74 | HR 72 | Resp 18 | Ht 66.0 in | Wt 141.1 lb

## 2015-05-05 DIAGNOSIS — M179 Osteoarthritis of knee, unspecified: Secondary | ICD-10-CM

## 2015-05-05 DIAGNOSIS — M1710 Unilateral primary osteoarthritis, unspecified knee: Secondary | ICD-10-CM

## 2015-05-05 DIAGNOSIS — R0989 Other specified symptoms and signs involving the circulatory and respiratory systems: Secondary | ICD-10-CM

## 2015-05-05 DIAGNOSIS — F329 Major depressive disorder, single episode, unspecified: Secondary | ICD-10-CM | POA: Diagnosis not present

## 2015-05-05 DIAGNOSIS — Z23 Encounter for immunization: Secondary | ICD-10-CM

## 2015-05-05 DIAGNOSIS — K635 Polyp of colon: Secondary | ICD-10-CM

## 2015-05-05 DIAGNOSIS — Q782 Osteopetrosis: Secondary | ICD-10-CM

## 2015-05-05 DIAGNOSIS — IMO0002 Reserved for concepts with insufficient information to code with codable children: Secondary | ICD-10-CM

## 2015-05-05 DIAGNOSIS — F32A Depression, unspecified: Secondary | ICD-10-CM

## 2015-05-05 DIAGNOSIS — I10 Essential (primary) hypertension: Secondary | ICD-10-CM

## 2015-05-05 DIAGNOSIS — K219 Gastro-esophageal reflux disease without esophagitis: Secondary | ICD-10-CM

## 2015-05-05 DIAGNOSIS — E042 Nontoxic multinodular goiter: Secondary | ICD-10-CM

## 2015-05-05 DIAGNOSIS — R55 Syncope and collapse: Secondary | ICD-10-CM

## 2015-05-05 DIAGNOSIS — I2510 Atherosclerotic heart disease of native coronary artery without angina pectoris: Secondary | ICD-10-CM

## 2015-05-05 DIAGNOSIS — N3941 Urge incontinence: Secondary | ICD-10-CM

## 2015-05-05 MED ORDER — MIRABEGRON ER 50 MG PO TB24: 50.0000 mg | ORAL_TABLET | Freq: Every day | ORAL | Status: DC

## 2015-05-05 MED ORDER — MIRTAZAPINE 7.5 MG PO TABS: 7.5000 mg | ORAL_TABLET | Freq: Every day | ORAL | Status: DC

## 2015-05-05 MED ORDER — RAMIPRIL 5 MG PO CAPS: 5.0000 mg | ORAL_CAPSULE | Freq: Every day | ORAL | Status: DC

## 2015-05-05 MED ORDER — FERROUS SULFATE 325 (65 FE) MG PO TABS: 325.0000 mg | ORAL_TABLET | Freq: Every day | ORAL | Status: DC

## 2015-05-05 NOTE — Patient Instructions (Addendum)
Annual wellness in 3 month, call if you need me before  You will have referral to Dr Laural Golden entered, pls schedule your appt , call the number  You are being referred to [facility] for f/u , I will specifically note that you want to call and make your appt.  Blood pressure and heart rate are good at visit today  For arthritis [place] tylenol 500 mg up to two twice daily, and aleve no more than two per day.HOPEFULLY you will be able to stop alleve in the next 2 to 4 weeks as this is more potentially damaging to your heatr and kidneys, do NOT want that long term  Labs today and prevnar today  Thanks for choosing [facility], we consider it a privelige to serve you.  PLEASE BE CAREFUL about falls, try to AVOID FALLING and ask for help that you may need

## 2015-05-06 ENCOUNTER — Encounter: Payer: Self-pay | Admitting: Family Medicine

## 2015-05-06 ENCOUNTER — Other Ambulatory Visit: Payer: Self-pay | Admitting: Family Medicine

## 2015-05-06 DIAGNOSIS — Z23 Encounter for immunization: Secondary | ICD-10-CM | POA: Insufficient documentation

## 2015-05-06 LAB — CBC
HCT: 33.9 % — ABNORMAL LOW (ref 36.0–46.0)
HEMOGLOBIN: 11 g/dL — AB (ref 12.0–15.0)
MCH: 30.2 pg (ref 26.0–34.0)
MCHC: 32.4 g/dL (ref 30.0–36.0)
MCV: 93.1 fL (ref 78.0–100.0)
MPV: 11.3 fL (ref 8.6–12.4)
PLATELETS: 215 10*3/uL (ref 150–400)
RBC: 3.64 MIL/uL — ABNORMAL LOW (ref 3.87–5.11)
RDW: 14.2 % (ref 11.5–15.5)
WBC: 6.2 10*3/uL (ref 4.0–10.5)

## 2015-05-06 LAB — BASIC METABOLIC PANEL
BUN: 34 mg/dL — ABNORMAL HIGH (ref 6–23)
CO2: 26 mEq/L (ref 19–32)
CREATININE: 0.96 mg/dL (ref 0.50–1.10)
Calcium: 9.4 mg/dL (ref 8.4–10.5)
Chloride: 99 mEq/L (ref 96–112)
Glucose, Bld: 111 mg/dL — ABNORMAL HIGH (ref 70–99)
POTASSIUM: 5.3 meq/L (ref 3.5–5.3)
Sodium: 136 mEq/L (ref 135–145)

## 2015-05-06 LAB — IRON: Iron: 75 ug/dL (ref 42–145)

## 2015-05-06 LAB — FERRITIN: FERRITIN: 87 ng/mL (ref 10–291)

## 2015-05-06 NOTE — Assessment & Plan Note (Signed)
Good result with current med, continue same

## 2015-05-06 NOTE — Assessment & Plan Note (Signed)
Repeat US due to furhter monitor, will refer for test , need to verify with pt that she will go

## 2015-05-06 NOTE — Assessment & Plan Note (Signed)
Needs annual IV bisphosphonate will arrange once updated lab is available

## 2015-05-06 NOTE — Progress Notes (Signed)
Subjective:    Patient ID: Kerry Flynn, female    DOB: Sep 18, 1927, 81 y.o.   MRN: [id number]  HPI The PT is here for follow up and re-evaluation of chronic medical conditions, medication management and review of any available recent lab and radiology data.  Preventive health is updated, specifically  Cancer screening and Immunization.   Questions or concerns regarding consultations or procedures which the PT has had in the interim are  addressed. The PT c/o light headedness, es[pescially when just standing. 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 Denies sinus pressure, nasal congestion, ear pain or sore throat. Denies chest congestion, productive cough or wheezing. Denies chest pains, palpitations and leg swelling Denies abdominal pain, nausea, vomiting,diarrhea or constipation.   Denies dysuria, frequency, hesitancy or incontinence. Denies headaches, seizures, numbness, or tingling. Denies skin break down or rash.        Objective:   Physical Exam BP 128/74 mmHg  Pulse 72  Resp 18  Ht 5\' 6"  (1.676 m)  Wt 141 lb 1.9 oz (64.012 kg)  BMI 22.79 kg/m2  SpO2 100% Patient alert and oriented and in no cardiopulmonary distress.  HEENT: No facial asymmetry, EOMI,   oropharynx pink and moist.  Neck decreased  no JVD, no mass.  Chest: Clear to  auscultation bilaterally.  CVS: S1, S2 no murmurs, no S3.Regular rate.  ABD: Soft non tender.   Ext: No edema  MS: decreased ROM spine, shoulders, hips and knees.  Skin: Intact, no ulcerations or rash noted.  Psych: Good eye contact, tearful affect. Memory intact  depressed appearing.But improved  CNS: CN 2-12 intact, power,  normal throughout.no focal deficits noted.        Assessment & Plan:  Essential hypertension Controlled, no change in medication    Faintness Reports light headedness when initially standing will reduce  Ramipril dose to 2.5mg , as systolic blood pressure is 120, will let her know when blood test results are being reviewed   Bilateral carotid bruits Reports recurrent light headedness, needs updated carotid doppler study   Colon polyps Pt to schedule follow up appt at her convenience   Multiple thyroid nodules Repeat US due to furhter monitor, will refer for test , need to verify with pt that she will go   CAD, NATIVE VESSEL Needs yearly cardiology follow up, reports having received a letter from Dr Gwenlyn Found who she wishes to see, when able, will enter referral, she will make her appt is my understanding, will ask referral staff to verify this plan   GERD (gastroesophageal reflux disease) Controlled, no change in medication    Urinary incontinence, urge Good result with current med, continue same   Osteoarthrosis, unspecified whether generalized or localized, involving lower leg Reports not using meloxicam , though she understands less GI stress potential  Using naproxen 4 per day, advised reduce to 2 per day with a view o stopping and take tylenol up to 2 gm per day. Recent left intra articular injection has provided some relief   Osteopetrosis Needs annual IV bisphosphonate will arrange once updated lab is available   Need for vaccination with 13-polyvalent pneumococcal conjugate vaccine After obtaining informed consent, the vaccine  is  administered by LPN.

## 2015-05-06 NOTE — Assessment & Plan Note (Signed)
Controlled, no change in medication  

## 2015-05-06 NOTE — Assessment & Plan Note (Signed)
Pt to schedule follow up appt at her convenience

## 2015-05-06 NOTE — Assessment & Plan Note (Signed)
Reports not using meloxicam , though she understands less GI stress potential Using naproxen 4 per day, advised reduce to 2 per day with a view o stopping and take tylenol up to 2 gm per day. Recent left intra articular injection has provided some relief

## 2015-05-06 NOTE — Assessment & Plan Note (Addendum)
Reports light headedness when initially standing will reduce  Ramipril dose to 2.5mg , as systolic blood pressure is 120, will let her know when blood test results are being reviewed

## 2015-05-06 NOTE — Assessment & Plan Note (Signed)
Needs yearly cardiology follow up, reports having received a letter from Dr Gwenlyn Found who she wishes to see, when able, will enter referral, she will make her appt is my understanding, will ask referral staff to verify this plan

## 2015-05-06 NOTE — Assessment & Plan Note (Signed)
After obtaining informed consent, the vaccine is  administered by LPN.  

## 2015-05-06 NOTE — Assessment & Plan Note (Signed)
Reports recurrent light headedness, needs updated carotid doppler study

## 2015-05-07 ENCOUNTER — Other Ambulatory Visit: Payer: Self-pay

## 2015-05-07 DIAGNOSIS — I10 Essential (primary) hypertension: Secondary | ICD-10-CM

## 2015-05-07 MED ORDER — RAMIPRIL 2.5 MG PO CAPS: 2.5000 mg | ORAL_CAPSULE | Freq: Every day | ORAL | Status: DC

## 2015-05-10 ENCOUNTER — Other Ambulatory Visit: Payer: Self-pay | Admitting: Internal Medicine

## 2015-05-14 ENCOUNTER — Ambulatory Visit (HOSPITAL_COMMUNITY)
Admission: RE | Admit: 2015-05-14 | Discharge: 2015-05-14 | Disposition: A | Discharge status: Home / Self Care | Payer: Medicare Other | Source: Ambulatory Visit | Attending: Family Medicine | Admitting: Family Medicine

## 2015-05-14 ENCOUNTER — Other Ambulatory Visit: Payer: Self-pay | Admitting: Family Medicine

## 2015-05-14 ENCOUNTER — Telehealth: Payer: Self-pay

## 2015-05-14 ENCOUNTER — Other Ambulatory Visit: Payer: Self-pay

## 2015-05-14 ENCOUNTER — Other Ambulatory Visit: Payer: Self-pay | Admitting: Internal Medicine

## 2015-05-14 DIAGNOSIS — R0989 Other specified symptoms and signs involving the circulatory and respiratory systems: Secondary | ICD-10-CM | POA: Insufficient documentation

## 2015-05-14 DIAGNOSIS — E042 Nontoxic multinodular goiter: Secondary | ICD-10-CM

## 2015-05-14 MED ORDER — MELOXICAM 7.5 MG PO TABS: 7.5000 mg | ORAL_TABLET | Freq: Every day | ORAL | Status: DC

## 2015-05-14 NOTE — Telephone Encounter (Signed)
Yes pls refill meloxicam 7.5 mg one daily, review the dose with her also

## 2015-05-14 NOTE — Telephone Encounter (Signed)
Med refilled and patient aware. 

## 2015-06-03 ENCOUNTER — Encounter: Payer: Self-pay | Admitting: *Deleted

## 2015-06-03 ENCOUNTER — Other Ambulatory Visit: Payer: Self-pay | Admitting: *Deleted

## 2015-06-03 NOTE — Patient Outreach (Signed)
[place] [facility]) Care Management   06/03/2015  Kerry Flynn Jan 27, 1927 [id number]  Kerry Flynn is an 81 y.o. female  Subjective:  Patient reporting she is doing fairly well, denies any falls, stating she is being deliberately careful and determined not to have any unnecessary falls, as she had a fractured hip from one of her falls. Patient has appointment this afternoon with foot MD.  Patient also has appointment to see Dr. Nida-endocrinologist to check on small thyroid goiter recently found. Patient to see Cardiologist in July Patient continues to have help in her home, she states she is not ready to make any major decisions around moving to alternate level of care but is happy to have the information to think on and discuss with family. Patient denies any new concerns other than the goiter and has questions about what this may mean. She does report a slight tremor in her hands and reports sometimes she is dizzy, not sure if caused by medication or what, she will discuss at upcoming appointments. She does have meclizine for vertigo episodes if needed.  Objective:  BP 118/64 mmHg  Pulse 68  Resp 20  Ht 1.676 m (_0 )  Wt 141 lb (63.957 kg)  BMI 22.77 kg/m2  SpO2 97%  Review of Systems  Constitutional: Negative.   HENT: Negative.   Respiratory: Negative.   Gastrointestinal: Negative.   Musculoskeletal: Positive for joint pain.       Knees left more than right ("bone on bone")  Skin: Negative.   Neurological: Positive for tremors.       Reports a new slight tremor in hands  Endo/Heme/Allergies: Negative.     Physical Exam  Constitutional: She is oriented to person, place, and time. She appears well-developed.  Neck: Normal range of motion.  Cardiovascular: Normal rate and normal heart sounds.   Respiratory: Effort normal and breath sounds normal.  GI: Soft. Bowel sounds are normal.  Musculoskeletal: She exhibits edema.  +1 in feet, legs   Neurological:  She is alert and oriented to person, place, and time.  Skin: Skin is warm.  Psychiatric: She has a normal mood and affect. Her behavior is normal. Thought content normal.    Current Medications:   Current Outpatient Prescriptions  Medication Sig Dispense Refill  . acetaminophen (TYLENOL) 500 MG tablet Take 500 mg by mouth every 6 (six) hours as needed.    Marland Kitchen amLODipine (NORVASC) 5 MG tablet Take 1 tablet (5 mg total) by mouth daily. 30 tablet 3  . Calcium-Vitamin D-Vitamin K 500-100-40 MG-UNT-MCG CHEW Chew 1 tablet by mouth 2 (two) times daily.     Marland Kitchen docusate sodium (COLACE) 100 MG capsule Take 100 mg by mouth 3 (three) times daily as needed (constipation).     . Magnesium Oxide (PHILLIPS) 500 MG (LAX) TABS Take 1 tablet (500 mg total) by mouth daily as needed.  0  . meclizine (ANTIVERT) 12.5 MG tablet Take 1 tablet (12.5 mg total) by mouth as needed. vertigo 30 tablet 0  . meloxicam (MOBIC) 7.5 MG tablet Take 1 tablet (7.5 mg total) by mouth daily. 30 tablet 0  . mirtazapine (REMERON) 7.5 MG tablet Take 1 tablet (7.5 mg total) by mouth at bedtime. 30 tablet 4  . Misc Natural Products (COSAMIN ASU ADVANCED FORMULA) CAPS Take 1 capsule by mouth 3 (three) times daily with meals.     . Multiple Vitamin (MULTIVITAMIN) tablet Take 1 tablet by mouth daily.      Marland Kitchen MYRBETRIQ  50 MG TB24 tablet TAKE 1 TABLET BY MOUTH EVERY DAY 30 tablet 2  . nitroGLYCERIN (NITROSTAT) 0.4 MG SL tablet Place 0.4 mg under the tongue every 5 (five) minutes as needed. 1 tablet under the tongue at onset of chest pain: you may repeat every 5 minutes for up to 3 doses    . omeprazole (PRILOSEC OTC) 20 MG tablet Take 40 mg by mouth 2 (two) times daily as needed (acid reflux).     . Probiotic Product (HEALTHY COLON) CAPS Take 1 capsule by mouth daily.     . ramipril (ALTACE) 2.5 MG capsule Take 1 capsule (2.5 mg total) by mouth daily. 30 capsule 2  . Wheat Dextrin (BENEFIBER) POWD Take 2 scoop by mouth 2 (two) times daily.     .  mirabegron ER (MYRBETRIQ) 50 MG TB24 tablet Take 1 tablet (50 mg total) by mouth daily. (Patient not taking: Reported on 06/03/2015) 30 tablet 4   No current facility-administered medications for this visit.    Functional Status:   In your present state of health, do you have any difficulty performing the following activities: 05/02/2015 02/03/2015  Hearing? N -  Vision? Y -  Difficulty concentrating or making decisions? N -  Walking or climbing stairs? Y -  Dressing or bathing? N -  Doing errands, shopping? Y N  Preparing Food and eating ? Y -  Using the Toilet? N -  In the past six months, have you accidently leaked urine? Y -  Do you have problems with loss of bowel control? N -  Managing your Medications? Y -  Managing your Finances? Y -  Housekeeping or managing your Housekeeping? Y -    Fall/Depression Screening:    PHQ 2/9 Scores 05/02/2015 04/04/2014  PHQ - 2 Score 2 0  PHQ- 9 Score 2 -    Assessment:   Assessment completed per protocol Reviewed plans for level of care, encouraged patient in her decision to not make any rash decisions Updated medication list-some changes from last primary care appointment, verified medication management system with patient and aide.  Plan:  Visit for July, will review the appointment with endocrinologist and provide any needed education around Any new diagnosis relating to goiter. Continue to assess for any resources or community referral needs and refer to [facility] CS[facility] if indicated.   Kerry Crochet. Laymond Purser, RN, BSN, [place] [phone]  [facility] CM Care Plan Problem One        Patient Outreach from 06/03/2015 in [place] Problem One  Home Safety/Fall risk   Care Plan for Problem One  Not Active   [facility] Long Term Goal (31-90 days)  Patient will not be hospitalized for fall related incident in the next 31 days   Interventions for Problem One Long Term Goal  utilizing teachback method, discussed home  safety fall risk prevention    [facility] CM Short Term Goal #1 (0-30 days)  patient and caregiver will verbalize fall risk and home safety plan of care in the next 30 days   [facility] CM Short Term Goal #1 Start Date  05/02/15   [facility] CM Short Term Goal #1 Met Date  06/03/15   Interventions for Short Term Goal #1  utilizing teachback method, discussed with patient and caregiver establishment of plan for fall prevention, including proper use of emergency call system, contacting appropriate caregiver    [facility] CM Short Term Goal #2 (0-30 days)  patient will  demonstrate appropriate technique for safely utilizing stairs in next 30 days    [facility] CM Short Term Goal #2 Start Date  05/02/15   [facility] CM Short Term Goal #2 Met Date  06/03/15   Interventions for Short Term Goal #2  interviewed patient about current process of going to upstairs bathroom, discussing safety measures using teachback method    [facility] CM Care Plan Problem Two        Patient Outreach from 06/03/2015 in [place] Problem Two  IADL concerns   Care Plan for Problem Two  Active   Interventions for Problem Two Long Term Goal   utilizing teachback method, reviewed with patient options for safe IADLs, including level of care discussion   [facility]N Long Term Goal (31-90) days  Patient will establish long term plan for assistance with IADLs in the next 60 days    [facility] Long Term Goal Start Date  06/03/15 [restarted as patient not ready to make any "rash decisions" ]   [facility] CM Short Term Goal #1 (0-30 days)  Patient will verbalize decision regarding desired level of care in the next 30 days    [facility] CM Short Term Goal #1 Start Date  06/03/15 Billy Fischer as patient not ready to make any "rash decision"]   Interventions for Short Term Goal #2   utilizing teachback method, reviewed definitions of various levels of care (Home, Independent living apartment, ALF)   [facility] CM Short Term Goal #2 (0-30 days)  Patient will verbalize determination of ability to pay  for selected level of care in the next 30 days    [facility] CM Short Term Goal #2 Start Date  05/02/15   Interventions for Short Term Goal #2  reviewed levels of care available, encouraged patient regarding her current situation and having needed assistance in home

## 2015-06-06 ENCOUNTER — Other Ambulatory Visit: Payer: Self-pay | Admitting: Family Medicine

## 2015-06-27 ENCOUNTER — Other Ambulatory Visit: Payer: Self-pay | Admitting: Family Medicine

## 2015-06-27 ENCOUNTER — Encounter: Payer: Self-pay | Admitting: *Deleted

## 2015-07-02 ENCOUNTER — Ambulatory Visit (INDEPENDENT_AMBULATORY_CARE_PROVIDER_SITE_OTHER): Payer: Medicare Other | Admitting: Cardiovascular Disease

## 2015-07-02 ENCOUNTER — Encounter: Payer: Self-pay | Admitting: Cardiovascular Disease

## 2015-07-02 VITALS — BP 160/72 | HR 88 | Ht 66.0 in | Wt 141.0 lb

## 2015-07-02 DIAGNOSIS — I2510 Atherosclerotic heart disease of native coronary artery without angina pectoris: Secondary | ICD-10-CM

## 2015-07-02 DIAGNOSIS — I10 Essential (primary) hypertension: Secondary | ICD-10-CM

## 2015-07-02 NOTE — Assessment & Plan Note (Signed)
History of minimal CAD by cath 10 years ago. She did have a negative Myoview stress test 02/29/12. She denies chest pain but does complain of dyspnea which is a chronic problem

## 2015-07-02 NOTE — Assessment & Plan Note (Signed)
History of hypertension blood pressure measured at 160/72. She is on ramipril and amlodipine. She also is in a lot of pain because of her hip and takes non-steroidal anti-inflammatory medications. She says her blood pressure is usually much lower than this.

## 2015-07-02 NOTE — Progress Notes (Signed)
07/02/2015 Kerry Flynn   [birth year]  [id number]  Primary Physician Tula Nakayama, MD Primary Cardiologist: Lorretta Harp MD Renae Gloss   HPI:  The patient is a very pleasant 81 year old, thin appearing, married Caucasian female, mother of no children who I last saw back in the office 07/19/13. She has a history of hypertension and insignificant CAD by cath performed by Dr. Christen Butter September 02, 2005 at the [facility]. She was complaining of progressive dyspnea and substernal chest pain with left upper extremity radiation. A Myoview performed several days ago was completely normal, as was a 2D echo. She has a favorable lipid profile for primary prevention. She apparently had Barrett's esophagus and is being followed by Dr. Laural Golden with a history of peptic ulcer. Since I saw her back in the office 2 years ago she had a hip fracture back in February requiring ORIF by Dr. Adine Madura and Mayo Ao. She had rehabilitation as an outpatient and when she returned home her husband of 59 years unfortunately passed away on 04/06/2015 of metastatic prostate cancer. There were married for 60 years. She now lives alone and has sitters. She does drive. Her closest relatives are a niece and nephew that lives in [place].  Current Outpatient Prescriptions  Medication Sig Dispense Refill  . acetaminophen (TYLENOL) 500 MG tablet Take 500 mg by mouth every 6 (six) hours as needed.    Marland Kitchen amLODipine (NORVASC) 5 MG tablet Take 1 tablet (5 mg total) by mouth daily. 30 tablet 3  . Calcium-Vitamin D-Vitamin K 500-100-40 MG-UNT-MCG CHEW Chew 1 tablet by mouth 2 (two) times daily.     Marland Kitchen docusate sodium (COLACE) 100 MG capsule Take 100 mg by mouth 3 (three) times daily as needed (constipation).     . Magnesium Oxide (PHILLIPS) 500 MG (LAX) TABS Take 1 tablet (500 mg total) by mouth daily as needed.  0  . meclizine (ANTIVERT) 12.5 MG tablet Take 1 tablet (12.5 mg total) by mouth as needed.  vertigo 30 tablet 0  . meloxicam (MOBIC) 7.5 MG tablet TAKE 1 TABLET (7.5 MG TOTAL) BY MOUTH DAILY. 30 tablet 0  . mirabegron ER (MYRBETRIQ) 50 MG TB24 tablet Take 1 tablet (50 mg total) by mouth daily. 30 tablet 4  . Misc Natural Products (COSAMIN ASU ADVANCED FORMULA) CAPS Take 1 capsule by mouth 3 (three) times daily with meals.     . Multiple Vitamin (MULTIVITAMIN) tablet Take 1 tablet by mouth daily.      Marland Kitchen omeprazole (PRILOSEC) 40 MG capsule TAKE ONE CAPSULE BY MOUTH TWICE A DAY AS NEEDED 30 capsule 5  . Probiotic Product (HEALTHY COLON) CAPS Take 1 capsule by mouth daily.     . ramipril (ALTACE) 2.5 MG capsule Take 1 capsule (2.5 mg total) by mouth daily. 30 capsule 2  . Wheat Dextrin (BENEFIBER) POWD Take 2 scoop by mouth 2 (two) times daily.     . nitroGLYCERIN (NITROSTAT) 0.4 MG SL tablet Place 0.4 mg under the tongue every 5 (five) minutes as needed. 1 tablet under the tongue at onset of chest pain: you may repeat every 5 minutes for up to 3 doses     No current facility-administered medications for this visit.    Allergies  Allergen Reactions  . Clarithromycin   . Flagyl [Metronidazole]   . Penicillins     History   Social History  . Marital Status: Widowed    Spouse Name: N/A  . Number of Children: 0  .  Years of Education: B.S.   Occupational History  . retired   .     Social History Main Topics  . Smoking status: Former Smoker -- 0.25 packs/day for .5 years  . Smokeless tobacco: Not on file  . Alcohol Use: 0.6 oz/week    1 Glasses of wine per week     Comment: occassionally wine with a meal  . Drug Use: No  . Sexual Activity: Not Currently   Other Topics Concern  . Not on file   Social History Narrative     Review of Systems: General: negative for chills, fever, night sweats or weight changes.  Cardiovascular: negative for chest pain, dyspnea on exertion, edema, orthopnea, palpitations, paroxysmal nocturnal dyspnea or shortness of  breath Dermatological: negative for rash Respiratory: negative for cough or wheezing Urologic: negative for hematuria Abdominal: negative for nausea, vomiting, diarrhea, bright red blood per rectum, melena, or hematemesis Neurologic: negative for visual changes, syncope, or dizziness All other systems reviewed and are otherwise negative except as noted above.    Blood pressure 160/72, pulse 88, height 5\' 6"  (1.676 m), weight 141 lb (63.957 kg).  General appearance: alert and no distress Neck: no adenopathy, no carotid bruit, no JVD, supple, symmetrical, trachea midline and thyroid not enlarged, symmetric, no tenderness/mass/nodules Lungs: clear to auscultation bilaterally Heart: regular rate and rhythm, S1, S2 normal, no murmur, click, rub or gallop Extremities: extremities normal, atraumatic, no cyanosis or edema  EKG not performed today  ASSESSMENT AND PLAN:   Essential hypertension History of hypertension blood pressure measured at 160/72. She is on ramipril and amlodipine. She also is in a lot of pain because of her hip and takes non-steroidal anti-inflammatory medications. She says her blood pressure is usually much lower than this.  CAD, NATIVE VESSEL History of minimal CAD by cath 10 years ago. She did have a negative Myoview stress test 02/29/12. She denies chest pain but does complain of dyspnea which is a chronic problem      Lorretta Harp MD FACP,FACC,FAHA, [facility] 07/02/2015 11:52 AM

## 2015-07-02 NOTE — Patient Instructions (Signed)
Dr Berry recommends that you schedule a follow-up appointment in 1 year. You will receive a reminder letter in the mail two months in advance. If you don't receive a letter, please call our office to schedule the follow-up appointment. 

## 2015-07-03 ENCOUNTER — Other Ambulatory Visit: Payer: Self-pay | Admitting: *Deleted

## 2015-07-03 VITALS — BP 138/60 | HR 70 | Resp 20 | Wt 142.0 lb

## 2015-07-03 DIAGNOSIS — M810 Age-related osteoporosis without current pathological fracture: Secondary | ICD-10-CM

## 2015-07-03 NOTE — Patient Outreach (Addendum)
[facility]) Care Management   07/03/2015  Kerry Flynn 11/08/1927 [id number]  Kerry Flynn is an 81 y.o. female  Subjective:  Patient reporting she saw Cardiologist yesterday, she drove with one of her private pay assistants in the car to appointment, reports she is very tired today and her knees are very sore and achy from the walking. Patient reports she does not have to go back for a year. Patient saw Dr. Dorris Fetch for the goiter, he did not feel it was anything to worry about. Patient will discuss both of these appointments with Her Primary care Doctor. Patient concerned about a tremor in her hand, states it is slight but her sister had Parkinson's and she has some concerns about it. Patient told Cardiologist about shortness of breath on exertion, but he did not assess further or change any medications.  Patient has been scheduled for IV Infusion of Reclast-asking about cost of medication.  Patient reports some depression but does not want to take Remeron that MD had prescribed, states she really does not want to take anything right now. Unable to locate bottle, patient reports she thinks she may have thrown it away since she did not take it.   Patient has appointment with Primary in August to discuss above concerns, voices understanding to call and make appointment sooner if needed.   Patient reports no falls since last visit.   Objective:   BP 138/60 mmHg  Pulse 70  Resp 20  Wt 142 lb (64.411 kg)  SpO2 97% Review of Systems  Constitutional: Negative.   HENT: Negative.   Eyes: Negative.   Respiratory: Negative.   Musculoskeletal: Positive for joint pain.       Bilateral knee pain, wearing brace on left knee  Skin: Negative.   Psychiatric/Behavioral: Positive for depression.    Physical Exam  Constitutional: She is oriented to person, place, and time. She appears well-developed.  Neck: Normal range of motion.  Cardiovascular: Normal rate.    Respiratory: Effort normal and breath sounds normal.  GI: Soft. Bowel sounds are normal.  Musculoskeletal: Normal range of motion.  Patient slightly kyphotic posture. Ambulates slowly and deliberately with walker or cane  Neurological: She is alert and oriented to person, place, and time.  Skin: Skin is warm and dry.  Psychiatric: She has a normal mood and affect. Her behavior is normal. Judgment and thought content normal.    Current Medications:   Current Outpatient Prescriptions  Medication Sig Dispense Refill  . acetaminophen (TYLENOL) 500 MG tablet Take 500 mg by mouth every 6 (six) hours as needed.    Marland Kitchen amLODipine (NORVASC) 5 MG tablet Take 1 tablet (5 mg total) by mouth daily. 30 tablet 3  . Calcium-Vitamin D-Vitamin K 500-100-40 MG-UNT-MCG CHEW Chew 1 tablet by mouth 2 (two) times daily.     Marland Kitchen docusate sodium (COLACE) 100 MG capsule Take 100 mg by mouth 3 (three) times daily as needed (constipation).     . Magnesium Oxide (PHILLIPS) 500 MG (LAX) TABS Take 1 tablet (500 mg total) by mouth daily as needed.  0  . meclizine (ANTIVERT) 12.5 MG tablet Take 1 tablet (12.5 mg total) by mouth as needed. vertigo 30 tablet 0  . meloxicam (MOBIC) 7.5 MG tablet TAKE 1 TABLET (7.5 MG TOTAL) BY MOUTH DAILY. 30 tablet 0  . mirabegron ER (MYRBETRIQ) 50 MG TB24 tablet Take 1 tablet (50 mg total) by mouth daily. 30 tablet 4  . Misc Natural Products (COSAMIN ASU  ADVANCED FORMULA) CAPS Take 1 capsule by mouth 3 (three) times daily with meals.     . Multiple Vitamin (MULTIVITAMIN) tablet Take 1 tablet by mouth daily.      . nitroGLYCERIN (NITROSTAT) 0.4 MG SL tablet Place 0.4 mg under the tongue every 5 (five) minutes as needed. 1 tablet under the tongue at onset of chest pain: you may repeat every 5 minutes for up to 3 doses    . omeprazole (PRILOSEC) 40 MG capsule TAKE ONE CAPSULE BY MOUTH TWICE A DAY AS NEEDED 30 capsule 5  . Probiotic Product (HEALTHY COLON) CAPS Take 1 capsule by mouth daily.      . ramipril (ALTACE) 2.5 MG capsule Take 1 capsule (2.5 mg total) by mouth daily. 30 capsule 2  . Wheat Dextrin (BENEFIBER) POWD Take 2 scoop by mouth 2 (two) times daily.      No current facility-administered medications for this visit.    Functional Status:   In your present state of health, do you have any difficulty performing the following activities: 05/02/2015 02/03/2015  Hearing? N -  Vision? Y -  Difficulty concentrating or making decisions? N -  Walking or climbing stairs? Y -  Dressing or bathing? N -  Doing errands, shopping? Y N  Preparing Food and eating ? Y -  Using the Toilet? N -  In the past six months, have you accidently leaked urine? Y -  Do you have problems with loss of bowel control? N -  Managing your Medications? Y -  Managing your Finances? Y -  Housekeeping or managing your Housekeeping? Y -    Fall/Depression Screening:    PHQ 2/9 Scores 05/02/2015 04/04/2014  PHQ - 2 Score 2 0  PHQ- 9 Score 2 -    Assessment:   Patient given 2 EMMI, on one Elder Care resources, the other on Fall prevention. Reviewed goal of patient staying in her home for now and reviewed resources to remain safely in her home. Reviewed current plan and discussed future needs. Patient is happy with plan to remain in home at this time. Reviewed past appointment results Reviewed upcoming appointments, encouraged patient to discuss her depression with Primary care if it worsens and she decides to take medication-unable to locate prescription bottle of Remeron. Question on cost of IV medication  Plan:  Refer question about IV Reclast to our pharmacist Visit again in August, assess for discharge  [facility] E. Laymond Purser, RN, BSN, [place] [phone]  [facility] CM Care Plan Problem One        Patient Outreach from 06/03/2015 in [place] Problem One  Home Safety/Fall risk   Care Plan for Problem One  Not Active   [facility]N Long Term Goal (31-90 days)   Patient will not be hospitalized for fall related incident in the next 31 days   Interventions for Problem One Long Term Goal  utilizing teachback method, discussed home safety fall risk prevention    [facility]N CM Short Term Goal #1 (0-30 days)  patient and caregiver will verbalize fall risk and home safety plan of care in the next 30 days   THN CM Short Term Goal #1 Start Date  05/02/15   [facility] CM Short Term Goal #1 Met Date  06/03/15   Interventions for Short Term Goal #1  utilizing teachback method, discussed with patient and caregiver establishment of plan for fall prevention, including proper use of emergency call system, contacting appropriate  caregiver    THN CM Short Term Goal #2 (0-30 days)  patient will demonstrate appropriate technique for safely utilizing stairs in next 30 days    [facility]N CM Short Term Goal #2 Start Date  05/02/15   [facility] CM Short Term Goal #2 Met Date  06/03/15   Interventions for Short Term Goal #2  interviewed patient about current process of going to upstairs bathroom, discussing safety measures using teachback method    [facility] CM Care Plan Problem Two        Patient Outreach from 07/03/2015 in [place] Problem Two  IADL concerns   Care Plan for Problem Two  -- [[employer] resources]   Interventions for Problem Two Long Term Goal   using teachback method reviewed patient situation and gave Elder Care resource EMMI.   THN Long Term Goal (31-90) days  Patient will establish long term plan for assistance with IADLs in the next 60 days    THN Long Term Goal Start Date  06/03/15   [facility] CM Short Term Goal #1 (0-30 days)  Patient will verbalize decision regarding desired level of care in the next 30 days    THN CM Short Term Goal #1 Start Date  06/03/15   [facility] CM Short Term Goal #1 Met Date   07/03/15   Interventions for Short Term Goal #2   utilizing teachback method, reviewed definitions of various levels of care (Home, Independent living apartment, ALF)   THN  CM Short Term Goal #2 (0-30 days)  Patient will verbalize determination of ability to pay for selected level of care in the next 30 days    [facility]N CM Short Term Goal #2 Start Date  05/02/15   [facility] CM Short Term Goal #2 Met Date  07/03/15   Interventions for Short Term Goal #2  Reviewed patient plan, options for later on if her situation changes and she needs long term care options.

## 2015-07-04 ENCOUNTER — Other Ambulatory Visit: Payer: Self-pay | Admitting: Pharmacist

## 2015-07-04 NOTE — Patient Outreach (Signed)
[facility]) Care Management  07/04/2015  [name] [birth year] [id number]   Request from Burgess Amor, RN to assign Pharmacy, Deanne Coffer, PharmD assigned.  Ronnell Freshwater. [place], [place] [facility] Assistant Phone: [phone] Fax: [phone]

## 2015-07-04 NOTE — Addendum Note (Signed)
Addended by: Burgess Amor E on: 07/04/2015 12:37 PM   Modules accepted: Orders

## 2015-07-04 NOTE — Patient Outreach (Signed)
Called to follow up with Kerry Flynn regarding assistance in determining the drug cost of an upcoming IV Reclast infusion that she is scheduled to have at [facility]. Let Kerry Flynn know that I spoke with an [place] Pharmacist, Kerry Flynn, and that the total drug cost will be $1,506.60. Explained that his is the cost of the drug that will be billed to the insurance/patient, but does not include other hospital or administration fees.  Kerry Flynn reports that she has spoken with her insurance, [employer], and that she has to pay 20% of the drug cost, which I calculate to be $301.32. Kerry Flynn states that she appreciates having this information.   Kerry Flynn reports that she also has a tremor that she is wondering about. Reports that this started about a year ago and has gotten more bothersome over time. Asks if any of her medication may be contributing to this. Reviewed Kerry Flynn's medication list looking for reported adverse events of tremor with each. Note that meloxicam and omeprazole have a reported adverse event percentage of <2% and <1% respectively. Communicate to the patient that these levels of reporting are likely not significant. Also, patient reports that she is no longer taking the meloxicam. Advise Kerry Flynn to discuss the tremor with her PCP, Kerry Flynn at their upcoming visit in August.  Kerry Flynn reports that she has no further medication questions at this time. Provided her with my contact information for future questions.   Harlow Asa, PharmD Clinical Pharmacist [place] [facility] [phone]

## 2015-07-04 NOTE — Patient Outreach (Signed)
Received a pharmacy referral from [facility] for Ms. Mckee as this patient needs assistance in determining the drug cost of an upcoming IV Reclast infusion that she is scheduled to have at [place]. Per Inpatient Pharmacist Ronalee Belts, the total drug cost will be $1,506.60. This is the cost of the drug that will be billed to the insurance/patient, but does not include other hospital or administration fees.  Will call to follow up with Ms. Kingsley and determine her cost.  Harlow Asa, PharmD Clinical Pharmacist [place] [facility] [phone]

## 2015-07-08 MED ORDER — ZOLEDRONIC ACID 5 MG/100ML IV SOLN
5.0000 mg | Freq: Once | INTRAVENOUS | Status: AC
  Administered 2015-07-09: 5 mg via INTRAVENOUS

## 2015-07-08 MED ORDER — SODIUM CHLORIDE 0.9 % IV SOLN: INTRAVENOUS | Status: DC

## 2015-07-09 ENCOUNTER — Encounter (HOSPITAL_COMMUNITY)
Admission: RE | Admit: 2015-07-09 | Discharge: 2015-07-09 | Disposition: A | Discharge status: Home / Self Care | Payer: Medicare Other | Source: Ambulatory Visit | Attending: "Endocrinology | Admitting: "Endocrinology

## 2015-07-09 DIAGNOSIS — M810 Age-related osteoporosis without current pathological fracture: Secondary | ICD-10-CM | POA: Insufficient documentation

## 2015-07-09 MED ORDER — ZOLEDRONIC ACID 5 MG/100ML IV SOLN
INTRAVENOUS | Status: AC
  Filled 2015-07-09: qty 100

## 2015-07-09 NOTE — Progress Notes (Signed)
"  OK" to infuse with recent lab results--per Ronalee Belts, [facility].

## 2015-07-14 ENCOUNTER — Encounter: Payer: Self-pay | Admitting: Cardiovascular Disease

## 2015-07-31 ENCOUNTER — Encounter: Payer: Self-pay | Admitting: *Deleted

## 2015-07-31 ENCOUNTER — Other Ambulatory Visit: Payer: Self-pay | Admitting: *Deleted

## 2015-07-31 NOTE — Patient Outreach (Addendum)
[facility]) Care Management   07/31/2015  Kerry Flynn Nov 26, 1927 [id number]  Kerry Flynn is an 81 y.o. female  Subjective:  Patient reporting she is doing well today.  Patient states she was able to spend time on her own over the weekend, she made it very well. Patient states her plan is to remain at home with "assistance" versus moving to an Assisted Living. Patient has been driving some with someone else in car, patient also using cane some in or around home just to build up strength. Patient continues to use Ensure as supplement,weight stable at 140#  Patient agrees to case closure, voices understanding of program and 24 hour nurse line. Patient has some pain in her joints and muscles that has increased after the Reclast injection. Patient states her tremor is still present, will discuss with MD. Patient also continues to have the swelling in her legs and ankles, will discuss with MD.  Patient agrees to case closure  Objective:   Filed Vitals:   07/31/15 1159  BP: 118/50  Pulse: 64  Resp: 20   Review of Systems  Constitutional: Negative.   HENT: Negative.   Eyes: Negative.   Respiratory: Negative.   Cardiovascular: Negative.   Gastrointestinal: Negative.   Genitourinary: Negative.   Musculoskeletal: Positive for myalgias and joint pain.  Skin: Negative.   Neurological: Negative.   Endo/Heme/Allergies: Negative.   Psychiatric/Behavioral: Negative.     Physical Exam  Constitutional: She is oriented to person, place, and time.  Neck: Normal range of motion.  Cardiovascular: Normal rate.   Respiratory: Effort normal.  GI: Soft. Bowel sounds are normal.  Musculoskeletal: She exhibits edema.  Limited range of motion in joints Swelling in feet/legs  Neurological: She is alert and oriented to person, place, and time.  Skin: Skin is warm and dry.  Psychiatric: She has a normal mood and affect.    Current Medications:   Current Outpatient  Prescriptions  Medication Sig Dispense Refill  . acetaminophen (TYLENOL) 500 MG tablet Take 500 mg by mouth every 6 (six) hours as needed.    Marland Kitchen amLODipine (NORVASC) 5 MG tablet Take 1 tablet (5 mg total) by mouth daily. 30 tablet 3  . Calcium-Vitamin D-Vitamin K 500-100-40 MG-UNT-MCG CHEW Chew 1 tablet by mouth 2 (two) times daily.     Marland Kitchen docusate sodium (COLACE) 100 MG capsule Take 100 mg by mouth 3 (three) times daily as needed (constipation).     . Magnesium Oxide (PHILLIPS) 500 MG (LAX) TABS Take 1 tablet (500 mg total) by mouth daily as needed.  0  . meclizine (ANTIVERT) 12.5 MG tablet Take 1 tablet (12.5 mg total) by mouth as needed. vertigo 30 tablet 0  . meloxicam (MOBIC) 7.5 MG tablet TAKE 1 TABLET (7.5 MG TOTAL) BY MOUTH DAILY. 30 tablet 0  . mirabegron ER (MYRBETRIQ) 50 MG TB24 tablet Take 1 tablet (50 mg total) by mouth daily. 30 tablet 4  . Misc Natural Products (COSAMIN ASU ADVANCED FORMULA) CAPS Take 1 capsule by mouth 3 (three) times daily with meals.     . Multiple Vitamin (MULTIVITAMIN) tablet Take 1 tablet by mouth daily.      . nitroGLYCERIN (NITROSTAT) 0.4 MG SL tablet Place 0.4 mg under the tongue every 5 (five) minutes as needed. 1 tablet under the tongue at onset of chest pain: you may repeat every 5 minutes for up to 3 doses    . omeprazole (PRILOSEC) 40 MG capsule TAKE ONE CAPSULE BY  MOUTH TWICE A DAY AS NEEDED 30 capsule 5  . Probiotic Product (HEALTHY COLON) CAPS Take 1 capsule by mouth daily.     . ramipril (ALTACE) 2.5 MG capsule Take 1 capsule (2.5 mg total) by mouth daily. 30 capsule 2  . Wheat Dextrin (BENEFIBER) POWD Take 2 scoop by mouth 2 (two) times daily.      No current facility-administered medications for this visit.    Functional Status:   In your present state of health, do you have any difficulty performing the following activities: 05/02/2015 02/03/2015  Hearing? N -  Vision? Y -  Difficulty concentrating or making decisions? N -  Walking or climbing  stairs? Y -  Dressing or bathing? N -  Doing errands, shopping? Y N  Preparing Food and eating ? Y -  Using the Toilet? N -  In the past six months, have you accidently leaked urine? Y -  Do you have problems with loss of bowel control? N -  Managing your Medications? Y -  Managing your Finances? Y -  Housekeeping or managing your Housekeeping? Y -    Fall/Depression Screening:    PHQ 2/9 Scores 07/03/2015 05/02/2015 04/04/2014  PHQ - 2 Score 2 2 0  PHQ- 9 Score 7 2 -    Assessment:   Wrote list of questions for patient to take to appointment 08/27/15 Reviewed plan with patient around her independence and emergency situation Updated plan of care  Plan:  Patient will call nurse line and 911 as needed Route visit update to MD Send case closure letter to MD Close case  Royetta Crochet. Laymond Purser, RN, BSN, [place] [phone]  [facility] CM Care Plan Problem One        Patient Outreach from 06/03/2015 in [place] Problem One  Home Safety/Fall risk   Care Plan for Problem One  Not Active   [facility] Long Term Goal (31-90 days)  Patient will not be hospitalized for fall related incident in the next 31 days   Interventions for Problem One Long Term Goal  utilizing teachback method, discussed home safety fall risk prevention    [facility] CM Short Term Goal #1 (0-30 days)  patient and caregiver will verbalize fall risk and home safety plan of care in the next 30 days   [facility] CM Short Term Goal #1 Start Date  05/02/15   [facility] CM Short Term Goal #1 Met Date  06/03/15   Interventions for Short Term Goal #1  utilizing teachback method, discussed with patient and caregiver establishment of plan for fall prevention, including proper use of emergency call system, contacting appropriate caregiver    [facility] CM Short Term Goal #2 (0-30 days)  patient will demonstrate appropriate technique for safely utilizing stairs in next 30 days    [facility] CM Short Term Goal #2 Start Date  05/02/15    [facility] CM Short Term Goal #2 Met Date  06/03/15   Interventions for Short Term Goal #2  interviewed patient about current process of going to upstairs bathroom, discussing safety measures using teachback method    [facility] CM Care Plan Problem Two        Patient Outreach from 07/31/2015 in [place] Problem Two  IADL concerns   Care Plan for Problem Two  Not Active [[employer] resources]   Interventions for Problem Two Long Term Goal   using teachback method, reviewed patient current plan for increasing  her independence with ADLs and IADLs, reinforced safety   [facility] Long Term Goal (31-90) days  Patient will establish long term plan for assistance with IADLs in the next 60 days    [facility] Long Term Goal Start Date  06/03/15   [facility] CM Short Term Goal #1 (0-30 days)  Patient will verbalize decision regarding desired level of care in the next 30 days    [facility] CM Short Term Goal #1 Start Date  06/03/15   [facility] CM Short Term Goal #1 Met Date   07/03/15   Interventions for Short Term Goal #2   utilizing teachback method, reviewed definitions of various levels of care (Home, Independent living apartment, ALF)   [facility] CM Short Term Goal #2 (0-30 days)  Patient will verbalize determination of ability to pay for selected level of care in the next 30 days    [facility] CM Short Term Goal #2 Start Date  05/02/15   [facility] CM Short Term Goal #2 Met Date  07/03/15   Interventions for Short Term Goal #2  Reviewed patient plan, options for later on if her situation changes and she needs long term care options.

## 2015-08-06 ENCOUNTER — Telehealth (INDEPENDENT_AMBULATORY_CARE_PROVIDER_SITE_OTHER): Payer: Self-pay | Admitting: *Deleted

## 2015-08-06 ENCOUNTER — Encounter: Payer: Medicare Other | Admitting: Family Medicine

## 2015-08-06 MED ORDER — PEG 3350-KCL-NA BICARB-NACL 420 G PO SOLR: 4000.0000 mL | Freq: Once | ORAL | Status: DC

## 2015-08-06 NOTE — Patient Outreach (Signed)
[facility]) Care Management  07/31/2015  Kerry Flynn [birth year] [id number]   Notification from Burgess Amor, RN to close case due to goals met with [employer] [facility].  Thanks, Ronnell Freshwater. [place], [place] Assistant Phone: [phone] Fax: [phone]

## 2015-08-06 NOTE — Telephone Encounter (Signed)
Patient needs trilyte 

## 2015-08-06 NOTE — Telephone Encounter (Signed)
Patient has called to scheduled her 3 yr TCS (09/25/15) -- wants to know if she needs EGD at same time since she has Barrett's esophagus -- also had L partial hip replacement in 01/2015 what antibiotic will she need, allergic to PCN, flagyl, clarithromycin-- please advise

## 2015-08-07 ENCOUNTER — Other Ambulatory Visit: Payer: Self-pay | Admitting: Family Medicine

## 2015-08-10 ENCOUNTER — Other Ambulatory Visit: Payer: Self-pay | Admitting: Family Medicine

## 2015-08-11 ENCOUNTER — Other Ambulatory Visit (INDEPENDENT_AMBULATORY_CARE_PROVIDER_SITE_OTHER): Payer: Self-pay | Admitting: *Deleted

## 2015-08-11 ENCOUNTER — Encounter (INDEPENDENT_AMBULATORY_CARE_PROVIDER_SITE_OTHER): Payer: Self-pay | Admitting: *Deleted

## 2015-08-11 NOTE — Telephone Encounter (Signed)
error    This encounter was created in error - please disregard.

## 2015-08-11 NOTE — Telephone Encounter (Signed)
Patient aware only needs TCS

## 2015-08-11 NOTE — Telephone Encounter (Signed)
She just needs colonoscopy.

## 2015-08-12 ENCOUNTER — Other Ambulatory Visit (INDEPENDENT_AMBULATORY_CARE_PROVIDER_SITE_OTHER): Payer: Self-pay | Admitting: *Deleted

## 2015-08-12 DIAGNOSIS — Z86010 Personal history of colonic polyps: Secondary | ICD-10-CM

## 2015-08-26 ENCOUNTER — Telehealth (INDEPENDENT_AMBULATORY_CARE_PROVIDER_SITE_OTHER): Payer: Self-pay | Admitting: *Deleted

## 2015-08-26 NOTE — Telephone Encounter (Signed)
Referring MD/PCP: simpson   Procedure: tcs  Reason/Indication:  Hx polyps  Has patient had this procedure before?  Yes, 2013 -- epic  If so, when, by whom and where?    Is there a family history of colon cancer?  no  Who?  What age when diagnosed?    Is patient diabetic?   no      Does patient have prosthetic heart valve?  no  Do you have a pacemaker?  no  Has patient ever had endocarditis? no  Has patient had joint replacement within last 12 months?  Yes -- 01/2015 L partial hip replacement -- no antibiotic prior per Dr Laural Golden  Does patient tend to be constipated or take laxatives? no  Does patient have a history of alcohol/drug use?   Is patient on Coumadin, Plavix and/or Aspirin? no  Medications: see epic  Allergies: see epic  Medication Adjustment:   Procedure date & time: 09/25/15 at 100

## 2015-08-27 ENCOUNTER — Ambulatory Visit (INDEPENDENT_AMBULATORY_CARE_PROVIDER_SITE_OTHER): Payer: Medicare Other | Admitting: Family Medicine

## 2015-08-27 ENCOUNTER — Encounter: Payer: Self-pay | Admitting: Family Medicine

## 2015-08-27 VITALS — BP 120/70 | HR 74 | Resp 18 | Ht 66.0 in | Wt 141.0 lb

## 2015-08-27 DIAGNOSIS — Z23 Encounter for immunization: Secondary | ICD-10-CM | POA: Diagnosis not present

## 2015-08-27 DIAGNOSIS — M170 Bilateral primary osteoarthritis of knee: Secondary | ICD-10-CM

## 2015-08-27 DIAGNOSIS — F329 Major depressive disorder, single episode, unspecified: Secondary | ICD-10-CM

## 2015-08-27 DIAGNOSIS — K219 Gastro-esophageal reflux disease without esophagitis: Secondary | ICD-10-CM

## 2015-08-27 DIAGNOSIS — E559 Vitamin D deficiency, unspecified: Secondary | ICD-10-CM | POA: Diagnosis not present

## 2015-08-27 DIAGNOSIS — E042 Nontoxic multinodular goiter: Secondary | ICD-10-CM

## 2015-08-27 DIAGNOSIS — R42 Dizziness and giddiness: Secondary | ICD-10-CM

## 2015-08-27 DIAGNOSIS — N3941 Urge incontinence: Secondary | ICD-10-CM

## 2015-08-27 DIAGNOSIS — I10 Essential (primary) hypertension: Secondary | ICD-10-CM | POA: Diagnosis not present

## 2015-08-27 DIAGNOSIS — F32A Depression, unspecified: Secondary | ICD-10-CM

## 2015-08-27 DIAGNOSIS — R251 Tremor, unspecified: Secondary | ICD-10-CM

## 2015-08-27 NOTE — Assessment & Plan Note (Addendum)
Controlled, d/c  altace , anticipate starting a beta blocker for tremor

## 2015-08-27 NOTE — Progress Notes (Signed)
   Subjective:    Patient ID: Kerry Flynn, female    DOB: May 10, 1927, 81 y.o.   MRN: [id number]  HPI    Kerry Flynn     MRN: [id number]      DOB: [birth year]   HPI Kerry Flynn is here for follow up and re-evaluation of chronic medical conditions, medication management and review of any available recent lab and radiology data.  Preventive health is updated, specifically  Cancer screening and Immunization.   Questions or concerns regarding consultations or procedures which the PT has had in the interim are  addressed. The PT denies any adverse reactions to current medications since the last visit.  C/o increased bilteral knee pain and stiffness, requests ortho referral for same C/o increased tremor in hands at rest, wants help with this ROS Denies recent fever or chills. Denies sinus pressure, nasal congestion, ear pain or sore throat. Denies chest congestion, productive cough or wheezing. Denies chest pains, palpitations and leg swelling Denies abdominal pain, nausea, vomiting,diarrhea or constipation.   Denies dysuria, frequency, hesitancy or incontinence.  Denies headaches, seizures, numbness, or tingling. Denie uncontrolled  depression, anxiety or insomnia. Denies skin break down or rash.   PE  BP 120/70 mmHg  Pulse 74  Resp 18  Ht 5\' 6"  (1.676 m)  Wt 141 lb 0.6 oz (63.975 kg)  BMI 22.78 kg/m2  SpO2 99%  Patient alert and oriented and in no cardiopulmonary distress.  HEENT: No facial asymmetry, EOMI,   oropharynx pink and moist.  Neck decreased ROM no JVD, no mass.  Chest: Clear to auscultation bilaterally.  CVS: S1, S2 no murmurs, no S3.Regular rate.  ABD: Soft non tender.   Ext: No edema  MS: Decreased  ROM spine, shoulders, hips and knees.Marked kyphosis  Skin: Intact, no ulcerations or rash noted.  Psych: Good eye contact, normal affect. Memory intact not anxious or depressed appearing.  CNS: CN 2-12 intact, power,  normal throughout.no focal  deficits noted.Resting tremor noted in both hands   Assessment & Plan   Osteoarthritis of both knees Increased and uncontrolled bilateral knee pain and stiffness refer ortho, has had intra articular injections successfully in the past  Essential hypertension Controlled, d/c  altace , anticipate starting a beta blocker for tremor    Tremor Increased resting tremor noted in past several months, neurology to eval and trat, exam negative for symptoms of Parkinsonism  Depression Grief and depression following loss of spouse lifting slowly, considering grief share group from which she will benefit, has support of family and caregivers  Urinary incontinence, urge conntrolled adequately on current med , continue same  INTERMITTENT VERTIGO No recent flare but keeps meclizine "on hand"  GERD (gastroesophageal reflux disease) Controlled, no change in medication   Need for prophylactic vaccination and inoculation against influenza After obtaining informed consent, the vaccine is  administered by LPN.   Multiple thyroid nodules Repeat image in 04/2015 unchanged since 2015, no recommendation to biopsy      .    Review of Systems        Physical Exam        Assessment & Plan:

## 2015-08-27 NOTE — Patient Instructions (Addendum)
Annual wellness Jan 10 or after, call if you need me before  Flu vaccine today  You are referred to Dr Aline Brochure re bilateral knee pain  DISCONTINUE RAMIPRIL blood pressure is excellent and you may need to start a beta blocker due to tremor, will let neurologist decide  You are referred to Dr Merlene Laughter re tremor  I recommend a house alarm system, driving with a passenger and always having your alert necklace on  Thankful that you feel care and love from family and close associates/ friends  Grief share group will be good for you as you want this  Be careful not to fall  Thanks for choosing [facility], we consider it a privelige to serve you.  Fasting lipid, cmp and tSH and Vit D in January

## 2015-08-27 NOTE — Assessment & Plan Note (Addendum)
Increased and uncontrolled bilateral knee pain and stiffness refer ortho, has had intra articular injections successfully in the past

## 2015-08-28 NOTE — Telephone Encounter (Signed)
agree

## 2015-09-03 ENCOUNTER — Other Ambulatory Visit: Payer: Self-pay | Admitting: Family Medicine

## 2015-09-07 ENCOUNTER — Other Ambulatory Visit: Payer: Self-pay | Admitting: Family Medicine

## 2015-09-07 ENCOUNTER — Telehealth: Payer: Self-pay | Admitting: Family Medicine

## 2015-09-07 DIAGNOSIS — Z23 Encounter for immunization: Secondary | ICD-10-CM | POA: Insufficient documentation

## 2015-09-07 NOTE — Assessment & Plan Note (Signed)
After obtaining informed consent, the vaccine is  administered by LPN.  

## 2015-09-07 NOTE — Assessment & Plan Note (Signed)
Increased resting tremor noted in past several months, neurology to eval and trat, exam negative for symptoms of Parkinsonism

## 2015-09-07 NOTE — Assessment & Plan Note (Signed)
Grief and depression following loss of spouse lifting slowly, considering grief share group from which she will benefit, has support of family and caregivers

## 2015-09-07 NOTE — Assessment & Plan Note (Signed)
Repeat image in 04/2015 unchanged since 2015, no recommendation to biopsy

## 2015-09-07 NOTE — Assessment & Plan Note (Signed)
No recent flare but keeps meclizine "on hand"

## 2015-09-07 NOTE — Assessment & Plan Note (Signed)
conntrolled adequately on current med , continue same

## 2015-09-07 NOTE — Telephone Encounter (Signed)
pls call pt and verify that she has stopped the ramipril as was decided at recent oV, then fax the d/c note thjo th pharmacy  ??/concerns PLS discuss

## 2015-09-07 NOTE — Assessment & Plan Note (Signed)
Controlled, no change in medication  

## 2015-09-08 ENCOUNTER — Telehealth: Payer: Self-pay | Admitting: *Deleted

## 2015-09-08 NOTE — Telephone Encounter (Signed)
Patient d/c'd ramipril and note sent to the pharmacy

## 2015-09-08 NOTE — Telephone Encounter (Signed)
Med refilled 9/9

## 2015-09-08 NOTE — Telephone Encounter (Signed)
Pt called requesting a refill on mobix. Please advise

## 2015-09-15 ENCOUNTER — Ambulatory Visit (HOSPITAL_COMMUNITY)
Admission: RE | Admit: 2015-09-15 | Discharge: 2015-09-15 | Disposition: A | Discharge status: Home / Self Care | Payer: Medicare Other | Source: Ambulatory Visit | Attending: Orthopedic Surgery | Admitting: Orthopedic Surgery

## 2015-09-15 ENCOUNTER — Other Ambulatory Visit: Payer: Self-pay | Admitting: Orthopedic Surgery

## 2015-09-15 DIAGNOSIS — M1120 Other chondrocalcinosis, unspecified site: Secondary | ICD-10-CM | POA: Diagnosis not present

## 2015-09-15 DIAGNOSIS — M5442 Lumbago with sciatica, left side: Secondary | ICD-10-CM | POA: Insufficient documentation

## 2015-09-15 DIAGNOSIS — M170 Bilateral primary osteoarthritis of knee: Secondary | ICD-10-CM | POA: Diagnosis not present

## 2015-09-15 DIAGNOSIS — M25561 Pain in right knee: Secondary | ICD-10-CM | POA: Insufficient documentation

## 2015-09-15 DIAGNOSIS — M1712 Unilateral primary osteoarthritis, left knee: Secondary | ICD-10-CM | POA: Diagnosis not present

## 2015-09-15 DIAGNOSIS — M25562 Pain in left knee: Secondary | ICD-10-CM

## 2015-09-15 DIAGNOSIS — R937 Abnormal findings on diagnostic imaging of other parts of musculoskeletal system: Secondary | ICD-10-CM | POA: Insufficient documentation

## 2015-09-15 DIAGNOSIS — M25569 Pain in unspecified knee: Secondary | ICD-10-CM

## 2015-09-18 ENCOUNTER — Ambulatory Visit (INDEPENDENT_AMBULATORY_CARE_PROVIDER_SITE_OTHER): Payer: Medicare Other | Admitting: Orthopedic Surgery

## 2015-09-18 VITALS — BP 158/68 | Ht 66.0 in | Wt 141.0 lb

## 2015-09-18 DIAGNOSIS — Z96649 Presence of unspecified artificial hip joint: Secondary | ICD-10-CM

## 2015-09-18 DIAGNOSIS — M170 Bilateral primary osteoarthritis of knee: Secondary | ICD-10-CM

## 2015-09-18 DIAGNOSIS — M5137 Other intervertebral disc degeneration, lumbosacral region: Secondary | ICD-10-CM

## 2015-09-18 DIAGNOSIS — Z9660 Presence of unspecified orthopedic joint implant: Secondary | ICD-10-CM | POA: Diagnosis not present

## 2015-09-18 NOTE — Patient Instructions (Signed)
We will call you when we have braces  Joint Injection Care After Refer to this sheet in the next few days. These instructions provide you with information on caring for yourself after you have had a joint injection. Your caregiver also may give you more specific instructions. Your treatment has been planned according to current medical practices, but problems sometimes occur. Call your caregiver if you have any problems or questions after your procedure. After any type of joint injection, it is not uncommon to experience:  Soreness, swelling, or bruising around the injection site.  Mild numbness, tingling, or weakness around the injection site caused by the numbing medicine used before or with the injection. It also is possible to experience the following effects associated with the specific agent after injection:  Iodine-based contrast agents:  Allergic reaction (itching, hives, widespread redness, and swelling beyond the injection site).  Corticosteroids (These effects are rare.):  Allergic reaction.  Increased blood sugar levels (If you have diabetes and you notice that your blood sugar levels have increased, notify your caregiver).  Increased blood pressure levels.  Mood swings.  Hyaluronic acid in the use of viscosupplementation.  Temporary heat or redness.  Temporary rash and itching.  Increased fluid accumulation in the injected joint. These effects all should resolve within a day after your procedure.  HOME CARE INSTRUCTIONS  Limit yourself to light activity the day of your procedure. Avoid lifting heavy objects, bending, stooping, or twisting.  Take prescription or over-the-counter pain medication as directed by your caregiver.  You may apply ice to your injection site to reduce pain and swelling the day of your procedure. Ice may be applied 03-04 times:  Put ice in a plastic bag.  Place a towel between your skin and the bag.  Leave the ice on for no longer than  15-20 minutes each time. SEEK IMMEDIATE MEDICAL CARE IF:   Pain and swelling get worse rather than better or extend beyond the injection site.  Numbness does not go away.  Blood or fluid continues to leak from the injection site.  You have chest pain.  You have swelling of your face or tongue.  You have trouble breathing or you become dizzy.  You develop a fever, chills, or severe tenderness at the injection site that last longer than 1 day. MAKE SURE YOU:  Understand these instructions.  Watch your condition.  Get help right away if you are not doing well or if you get worse. Document Released: 08/26/2011 Document Revised: 03/06/2012 Document Reviewed: 08/26/2011 [facility] Patient Information 2015 [place], [place]. This information is not intended to replace advice given to you by your health care provider. Make sure you discuss any questions you have with your health care provider.

## 2015-09-18 NOTE — Progress Notes (Signed)
Chief Complaint  Patient presents with  . Follow-up    follow up bilateral knee pain, referred by M. Moshe Cipro    History this is a 81 year old female who has had a bipolar left hip replacement in February but presents for evaluation of bilateral chronic knee pain with chronic osteoarthritis previously treated with orthovisc or Synvisc injections. She is having severe pain in both knees and her functional abilities are decreasing. She is using a rolling walker she still drives but her valgus deformities are increasing or lateral pain is increasing her motion is decreasing in her ability to perform activities of daily living are becoming more difficult  Review of systems notable for peripheral edema otherwise she has some left lower back pain with left-sided radicular symptoms from chronic degenerative disc disease also complains of tremors recent onset to see a neurologist soon.  Past Medical History  Diagnosis Date  . Hemorrhoids   . Intermittent vertigo   . Osteoporosis     knees  . Benign recurrent vertigo   . Vitamin D deficiency   . Head trauma     status post fall; upper and lower extremities   . GERD (gastroesophageal reflux disease)   . Diverticulosis   . Chronic constipation   . Osteoarthritis     of the neck  . Dermatitis     recurrent  . Hypertension   . Barrett's esophagus     Dr. Laural Golden, Dr. Moshe Cipro  . Infertility, female   . Peripheral edema     chronic  . Chronic knee pain   . Fatigue     chronic  . DDD (degenerative disc disease), lumbar   . DDD (degenerative disc disease), cervical   . Anemia   . Hip fracture, left     s/p left arthroplasty 2/16   BP 158/68 mmHg  Ht 5\' 6"  (1.676 m)  Wt 141 lb 0.6 oz (63.975 kg)  BMI 22.78 kg/m2  Overall her appearance is very good in terms of grooming and hygiene. She does not look sick or in distress  She is oriented 3 her mood is pleasant  She ambulates with bilateral valgus knees flexed posture at the spine uses a  walker with wheels  Bilateral valgus knee deformities noted in the flexion 100 the stability of the knees are normal in her motor exam is intact  Skin normal over both knees and both legs have neurovascular function which is normal   Her x-ray show severe valgus deformities of both knees these were recently done I have reviewed them and my independent interpretation is she has severe valgus osteoarthritis bilateral  Spine film I interpreted as severe degenerative disc disease with scoliosis  Impression scoliosis with degenerative disc disease Impression bilateral valgus osteoarthritis knee   I discussed with her bilateral knee replacements a tertiary care facility where they can manage her with a bilateral knee replacement in one setting which I cannot do. She also can have her back treated if it becomes symptomatic after the surgeries. She has increased risk just because of age but seems to have fairly good overall health considering her age.  For functional activities I don't see any other way to manage her. I do want her to get our unloader braces and I did inject both knees  Procedure note left knee injection verbal consent was obtained to inject left knee joint  Timeout was completed to confirm the site of injection  The medications used were 40 mg of Depo-Medrol and 1% lidocaine  3 cc  Anesthesia was provided by ethyl chloride and the skin was prepped with alcohol.  After cleaning the skin with alcohol a 20-gauge needle was used to inject the left knee joint. There were no complications. A sterile bandage was applied.   Procedure note right knee injection verbal consent was obtained to inject right knee joint  Timeout was completed to confirm the site of injection  The medications used were 40 mg of Depo-Medrol and 1% lidocaine 3 cc  Anesthesia was provided by ethyl chloride and the skin was prepped with alcohol.  After cleaning the skin with alcohol a 20-gauge needle was  used to inject the right knee joint. There were no complications. A sterile bandage was applied.

## 2015-09-22 ENCOUNTER — Other Ambulatory Visit: Payer: Self-pay | Admitting: Family Medicine

## 2015-09-25 ENCOUNTER — Encounter (HOSPITAL_COMMUNITY): Payer: Self-pay | Admitting: *Deleted

## 2015-09-25 ENCOUNTER — Ambulatory Visit (HOSPITAL_COMMUNITY)
Admission: RE | Admit: 2015-09-25 | Discharge: 2015-09-25 | Disposition: A | Discharge status: Home / Self Care | Payer: Medicare Other | Source: Ambulatory Visit | Attending: Internal Medicine | Admitting: Internal Medicine

## 2015-09-25 ENCOUNTER — Encounter (HOSPITAL_COMMUNITY)
Admission: RE | Disposition: A | Discharge status: Home / Self Care | Payer: Self-pay | Source: Ambulatory Visit | Attending: Internal Medicine

## 2015-09-25 DIAGNOSIS — E559 Vitamin D deficiency, unspecified: Secondary | ICD-10-CM | POA: Diagnosis not present

## 2015-09-25 DIAGNOSIS — Z79899 Other long term (current) drug therapy: Secondary | ICD-10-CM | POA: Diagnosis not present

## 2015-09-25 DIAGNOSIS — K644 Residual hemorrhoidal skin tags: Secondary | ICD-10-CM | POA: Insufficient documentation

## 2015-09-25 DIAGNOSIS — Z86010 Personal history of colonic polyps: Secondary | ICD-10-CM | POA: Diagnosis not present

## 2015-09-25 DIAGNOSIS — Z1211 Encounter for screening for malignant neoplasm of colon: Secondary | ICD-10-CM | POA: Diagnosis not present

## 2015-09-25 DIAGNOSIS — D122 Benign neoplasm of ascending colon: Secondary | ICD-10-CM | POA: Diagnosis not present

## 2015-09-25 DIAGNOSIS — Z87891 Personal history of nicotine dependence: Secondary | ICD-10-CM | POA: Insufficient documentation

## 2015-09-25 DIAGNOSIS — K5730 Diverticulosis of large intestine without perforation or abscess without bleeding: Secondary | ICD-10-CM | POA: Insufficient documentation

## 2015-09-25 DIAGNOSIS — I10 Essential (primary) hypertension: Secondary | ICD-10-CM | POA: Insufficient documentation

## 2015-09-25 HISTORY — PX: COLONOSCOPY: SHX5424

## 2015-09-25 HISTORY — DX: Asymptomatic varicose veins of unspecified lower extremity: I83.90

## 2015-09-25 SURGERY — COLONOSCOPY
Anesthesia: Moderate Sedation

## 2015-09-25 MED ORDER — SODIUM CHLORIDE 0.9 % IV SOLN
INTRAVENOUS | Status: DC
  Administered 2015-09-25: 13:00:00 via INTRAVENOUS

## 2015-09-25 MED ORDER — MEPERIDINE HCL 50 MG/ML IJ SOLN
INTRAMUSCULAR | Status: DC | PRN
  Administered 2015-09-25: 10 mg via INTRAVENOUS
  Administered 2015-09-25: 15 mg via INTRAVENOUS
  Administered 2015-09-25: 25 mg via INTRAVENOUS

## 2015-09-25 MED ORDER — MIDAZOLAM HCL 5 MG/5ML IJ SOLN
INTRAMUSCULAR | Status: DC | PRN
  Administered 2015-09-25 (×2): 1 mg via INTRAVENOUS

## 2015-09-25 MED ORDER — MEPERIDINE HCL 50 MG/ML IJ SOLN
INTRAMUSCULAR | Status: AC
  Filled 2015-09-25: qty 1

## 2015-09-25 MED ORDER — MIDAZOLAM HCL 5 MG/5ML IJ SOLN
INTRAMUSCULAR | Status: AC
  Filled 2015-09-25: qty 10

## 2015-09-25 MED ORDER — STERILE WATER FOR IRRIGATION IR SOLN
Status: DC | PRN
  Administered 2015-09-25: 13:00:00

## 2015-09-25 NOTE — Op Note (Signed)
COLONOSCOPY PROCEDURE REPORT  PATIENT:  Kerry Flynn  MR#:  [id number] Birthdate:  1927-07-08, 81 y.o., female Endoscopist:  Dr. Rogene Houston, MD Referred By:  Dr. Tula Nakayama, MD Procedure Date: 09/25/2015  Procedure:   Colonoscopy with snare polypectomy  Indications:  Patient is 81 year old Caucasian female with history of colonic adenomas. She is undergoing surveillance colonoscopy. She has intermittent hematochezia felt to be secondary to hemorrhoids.  Informed Consent:  The procedure and risks were reviewed with the patient and informed consent was obtained.  Medications:  Demerol 50 mg IV Versed 2 mg IV  Description of procedure:  After a digital rectal exam was performed, that colonoscope was advanced from the anus through the rectum and colon to the area of the cecum, ileocecal valve and appendiceal orifice. The cecum was deeply intubated. These structures were well-seen and photographed for the record. From the level of the cecum and ileocecal valve, the scope was slowly and cautiously withdrawn. The mucosal surfaces were carefully surveyed utilizing scope tip to flexion to facilitate fold flattening as needed. The scope was pulled down into the rectum where a thorough exam including retroflexion was performed.  Findings:  . Prep satisfactory. Small polyp at ascending colon was coagulated with snare tip. 15 x 7 mm polyp snared piecemeal from ascending colon. Polypectomy complete. Pancolonic diverticulosis. Small hemorrhoids below the dentate line.   Therapeutic/Diagnostic Maneuvers Performed:  See above  Complications:  None  EBL: None  Cecal Withdrawal Time:  20 minutes  Impression; Pancolonic diverticulosis. Small polyp at ascending colon was quite ablated with snare tip. 15 x 7 mm broad-based ascending colon polyp was snared piecemeal. Polypectomy complete. Small external hemorrhoids.   Recommendations:  Standard instructions given. No aspirin for 10  days. I will contact patient with biopsy results and further recommendations.  REHMAN,NAJEEB U  09/25/2015 2:01 PM  CC: Dr. Tula Nakayama, MD & Dr. Rayne Du ref. Talbert Trembath found

## 2015-09-25 NOTE — H&P (Signed)
Kerry Flynn is an 81 y.o. female.   Chief Complaint: Patient is here for colonoscopy. HPI: She is a 81 year old Caucasian female with multiple medical problems who also has history of colonic adenomas including large adenoma that was removed from her ascending colon in January 2013. Last colonoscopy was in April 2013. She has irregular bowel movements. At times she has explosive bowel movement. She has intermittent hematochezia which she believes is secondary to hemorrhoids. She denies abdominal pain. Family history is negative for CRC. Patient says heartburns well controlled with therapy. She has history of short segment Barrett's esophagus and last EGD was in January 2013.  Past Medical History  Diagnosis Date  . Hemorrhoids   . Intermittent vertigo   . Osteoporosis     knees  . Benign recurrent vertigo   . Vitamin D deficiency   . Head trauma     status post fall; upper and lower extremities   . GERD (gastroesophageal reflux disease)   . Diverticulosis   . Chronic constipation   . Dermatitis     recurrent  . Hypertension   . Barrett's esophagus     Dr. Laural Golden, Dr. Moshe Cipro  . Infertility, female   . Peripheral edema     chronic  . Chronic knee pain   . Fatigue     chronic  . Anemia   . Hip fracture, left     s/p left arthroplasty 2/16  . Osteoarthritis     of the neck  . DDD (degenerative disc disease), lumbar   . DDD (degenerative disc disease), cervical   . Varicose veins     Past Surgical History  Procedure Laterality Date  . Knee arthroscopy Right 1999  . Knee arthroscopy Left 1996  . Cholecystectomy, laparoscopic  06/17/2006    with stones  . Rotator cuff repair  05/14/2010  . Cataract extraction w/ intraocular lens implant Left 01/06/2012    left, [facility]  . Eye surgery  01/24/2012    bilateral cataract extraction  . Colonoscopy  04/19/2012    Procedure: COLONOSCOPY;  Surgeon: Rogene Houston, MD;  Location: [facility];  Service: Endoscopy;   Laterality: N/A;  1200  . Tonsillectomy and adenoidectomy  1934  . Palate surgery  1988  . Dental implant   09/2014  . Hip arthroplasty Left 01/31/2015    Procedure: ARTHROPLASTY BIPOLAR HIP;  Surgeon: Carole Civil, MD;  Location: [facility];  Service: Orthopedics;  Laterality: Left;  . Colonoscopy w/ polypectomy  05/29/1999, 01/12/2012  . Esophagogastroduodenoscopy  01/12/2012  . Upper gi endoscopy  12/12/2006    with biopsy, with colonoscopy - dx: Barrett's disease  . Transthoracic echocardiogram  01/2012    EF=>55%; mild mitral annular calcification & mild MR; trace TR; trace pulm valve regurg  . Nm myocar perf wall motion  02/2012    lexiscan - small, fized apical lateral bowel attenuation artifact; no reversible ischemia; EF 77%; non-diagnostic for ischemia; low risk  . Cardiac catheterization  09/02/2005    mild-mod calcification in mid LAD (20-30% luminal obstruction), otherwise normal coronaries, patent LE arteries (Dr. Jackie Plum)  . Carotid doppler  02/2008    R & L ICAs 0-49% diameter reduction  . Cholecystectomy    . Tonsillectomy      Family History  Problem Relation Age of Onset  . Parkinsonism Sister   . Diabetes Mother   . Hypertension Mother     cva  . CVA Mother   . Kidney disease Mother   .  Heart failure Mother   . Colon cancer Neg Hx   . Cancer Father     larengeal  . Heart failure Father     CHF  . Heart disease Father   . Osteoporosis Sister   . Stroke Maternal Grandmother   . Stroke Maternal Grandfather    Social History:  reports that she has quit smoking. She does not have any smokeless tobacco history on file. She reports that she drinks about 0.6 oz of alcohol per week. She reports that she does not use illicit drugs.  Allergies:  Allergies  Allergen Reactions  . Clarithromycin   . Flagyl [Metronidazole]   . Penicillins     Medications Prior to Admission  Medication Sig Dispense Refill  . acetaminophen (TYLENOL) 500 MG tablet Take 500 mg by mouth  every 6 (six) hours as needed.    Marland Kitchen amLODipine (NORVASC) 5 MG tablet TAKE 1 TABLET (5 MG TOTAL) BY MOUTH DAILY. 30 tablet 3  . Calcium-Vitamin D-Vitamin K 500-100-40 MG-UNT-MCG CHEW Chew 1 tablet by mouth 2 (two) times daily.     Marland Kitchen docusate sodium (COLACE) 100 MG capsule Take 100 mg by mouth 3 (three) times daily as needed (constipation).     . Magnesium Oxide (PHILLIPS) 500 MG (LAX) TABS Take 1 tablet (500 mg total) by mouth daily as needed.  0  . meloxicam (MOBIC) 7.5 MG tablet TAKE 1 TABLET (7.5 MG TOTAL) BY MOUTH DAILY. 30 tablet 2  . mirabegron ER (MYRBETRIQ) 50 MG TB24 tablet Take 1 tablet (50 mg total) by mouth daily. 30 tablet 4  . Misc Natural Products (COSAMIN ASU ADVANCED FORMULA) CAPS Take 1 capsule by mouth 3 (three) times daily with meals.     . Multiple Vitamin (MULTIVITAMIN) tablet Take 1 tablet by mouth daily.      Marland Kitchen omeprazole (PRILOSEC) 40 MG capsule TAKE ONE CAPSULE BY MOUTH TWICE A DAY AS NEEDED 30 capsule 5  . polyethylene glycol-electrolytes (NULYTELY/GOLYTELY) 420 G solution Take 4,000 mLs by mouth once. 4000 mL 0  . Probiotic Product (HEALTHY COLON) CAPS Take 1 capsule by mouth daily.     . Wheat Dextrin (BENEFIBER) POWD Take 2 scoop by mouth 2 (two) times daily.     . meclizine (ANTIVERT) 12.5 MG tablet Take 1 tablet (12.5 mg total) by mouth as needed. vertigo 30 tablet 0  . nitroGLYCERIN (NITROSTAT) 0.4 MG SL tablet Place 0.4 mg under the tongue every 5 (five) minutes as needed. 1 tablet under the tongue at onset of chest pain: you may repeat every 5 minutes for up to 3 doses      No results found for this or any previous visit (from the past 48 hour(s)). No results found.  ROS  Blood pressure 170/58, pulse 77, temperature 97.7 F (36.5 C), temperature source Oral, resp. rate 16, height 5\' 6"  (1.676 m), weight 141 lb (63.957 kg), SpO2 100 %. Physical Exam  Constitutional:  Well-developed thin Caucasian female in NAD  HENT:  Mouth/Throat: Oropharynx is clear and  moist.  Eyes: Conjunctivae are normal. No scleral icterus.  Neck: No thyromegaly present.  Cardiovascular: Normal rate, regular rhythm and normal heart sounds.   No murmur heard. Respiratory: Effort normal and breath sounds normal.  GI: Soft. She exhibits no distension and no mass. There is no tenderness.  Musculoskeletal: She exhibits no edema.  Lymphadenopathy:    She has no cervical adenopathy.  Neurological: She is alert.  Skin: Skin is warm and dry.  Assessment/Plan History of colonic adenomas. Surveillance colonoscopy.  Marvie Brevik U 09/25/2015, 12:46 PM

## 2015-09-25 NOTE — Discharge Instructions (Signed)
No aspirin for 10 days. No meloxicam or other NSAIDs for 4 days. Resume other medications and usual diet. No driving for 24 hours. Patient will call with biopsy results.  Colonoscopy, Care After These instructions give you information on caring for yourself after your procedure. Your doctor may also give you more specific instructions. Call your doctor if you have any problems or questions after your procedure. HOME CARE  Do not drive for 24 hours.  Do not sign important papers or use machinery for 24 hours.  You may shower.  You may go back to your usual activities, but go slower for the first 24 hours.  Take rest breaks often during the first 24 hours.  Walk around or use warm packs on your belly (abdomen) if you have belly cramping or gas.  Drink enough fluids to keep your pee (urine) clear or pale yellow.  Resume your normal diet. Avoid heavy or fried foods.  Avoid drinking alcohol for 24 hours or as told by your doctor.  Only take medicines as told by your doctor. If a tissue sample (biopsy) was taken during the procedure:   Do not take aspirin or blood thinners for 7 days, or as told by your doctor.  Do not drink alcohol for 7 days, or as told by your doctor.  Eat soft foods for the first 24 hours. GET HELP IF: You still have a small amount of blood in your poop (stool) 2-3 days after the procedure. GET HELP RIGHT AWAY IF:  You have more than a small amount of blood in your poop.  You see clumps of tissue (blood clots) in your poop.  Your belly is puffy (swollen).  You feel sick to your stomach (nauseous) or throw up (vomit).  You have a fever.  You have belly pain that gets worse and medicine does not help. MAKE SURE YOU:  Understand these instructions.  Will watch your condition.  Will get help right away if you are not doing well or get worse. Document Released: 01/15/2011 Document Revised: 12/18/2013 Document Reviewed: 08/20/2013 [facility] Patient  Information 2015 [place], [place]. This information is not intended to replace advice given to you by your health care provider. Make sure you discuss any questions you have with your health care provider.  Colon Polyps Polyps are lumps of extra tissue growing inside the body. Polyps can grow in the large intestine (colon). Most colon polyps are noncancerous (benign). However, some colon polyps can become cancerous over time. Polyps that are larger than a pea may be harmful. To be safe, caregivers remove and test all polyps. CAUSES  Polyps form when mutations in the genes cause your cells to grow and divide even though no more tissue is needed. RISK FACTORS There are a number of risk factors that can increase your chances of getting colon polyps. They include:  Being older than 50 years.  Family history of colon polyps or colon cancer.  Long-term colon diseases, such as colitis or Crohn disease.  Being overweight.  Smoking.  Being inactive.  Drinking too much alcohol. SYMPTOMS  Most small polyps do not cause symptoms. If symptoms are present, they may include:  Blood in the stool. The stool may look dark red or black.  Constipation or diarrhea that lasts longer than 1 week. DIAGNOSIS People often do not know they have polyps until their caregiver finds them during a regular checkup. Your caregiver can use 4 tests to check for polyps:  Digital rectal exam. The caregiver  wears gloves and feels inside the rectum. This test would find polyps only in the rectum.  Barium enema. The caregiver puts a liquid called barium into your rectum before taking X-rays of your colon. Barium makes your colon look white. Polyps are dark, so they are easy to see in the X-ray pictures.  Sigmoidoscopy. A thin, flexible tube (sigmoidoscope) is placed into your rectum. The sigmoidoscope has a light and tiny camera in it. The caregiver uses the sigmoidoscope to look at the last third of your  colon.  Colonoscopy. This test is like sigmoidoscopy, but the caregiver looks at the entire colon. This is the most common method for finding and removing polyps. TREATMENT  Any polyps will be removed during a sigmoidoscopy or colonoscopy. The polyps are then tested for cancer. PREVENTION  To help lower your risk of getting more colon polyps:  Eat plenty of fruits and vegetables. Avoid eating fatty foods.  Do not smoke.  Avoid drinking alcohol.  Exercise every day.  Lose weight if recommended by your caregiver.  Eat plenty of calcium and folate. Foods that are rich in calcium include milk, cheese, and broccoli. Foods that are rich in folate include chickpeas, kidney beans, and spinach. HOME CARE INSTRUCTIONS Keep all follow-up appointments as directed by your caregiver. You may need periodic exams to check for polyps. SEEK MEDICAL CARE IF: You notice bleeding during a bowel movement. Document Released: 09/08/2004 Document Revised: 03/06/2012 Document Reviewed: 02/22/2012 [facility] Patient Information 2015 [place], [place]. This information is not intended to replace advice given to you by your health care provider. Make sure you discuss any questions you have with your health care provider.

## 2015-09-30 ENCOUNTER — Encounter (HOSPITAL_COMMUNITY): Payer: Self-pay | Admitting: Internal Medicine

## 2015-10-11 ENCOUNTER — Other Ambulatory Visit: Payer: Self-pay | Admitting: Family Medicine

## 2015-10-15 ENCOUNTER — Encounter: Payer: Self-pay | Admitting: Family Medicine

## 2015-10-15 ENCOUNTER — Ambulatory Visit (INDEPENDENT_AMBULATORY_CARE_PROVIDER_SITE_OTHER): Payer: Medicare Other | Admitting: Family Medicine

## 2015-10-15 VITALS — BP 132/60 | HR 72 | Resp 18 | Ht 66.0 in | Wt 140.1 lb

## 2015-10-15 DIAGNOSIS — H9192 Unspecified hearing loss, left ear: Secondary | ICD-10-CM | POA: Diagnosis not present

## 2015-10-15 DIAGNOSIS — M170 Bilateral primary osteoarthritis of knee: Secondary | ICD-10-CM

## 2015-10-15 DIAGNOSIS — R251 Tremor, unspecified: Secondary | ICD-10-CM

## 2015-10-15 DIAGNOSIS — I2510 Atherosclerotic heart disease of native coronary artery without angina pectoris: Secondary | ICD-10-CM

## 2015-10-15 DIAGNOSIS — M5442 Lumbago with sciatica, left side: Secondary | ICD-10-CM

## 2015-10-15 DIAGNOSIS — I10 Essential (primary) hypertension: Secondary | ICD-10-CM

## 2015-10-15 DIAGNOSIS — R2681 Unsteadiness on feet: Secondary | ICD-10-CM | POA: Insufficient documentation

## 2015-10-15 DIAGNOSIS — N3941 Urge incontinence: Secondary | ICD-10-CM

## 2015-10-15 MED ORDER — MELOXICAM 15 MG PO TABS: 15.0000 mg | ORAL_TABLET | Freq: Every day | ORAL | Status: DC

## 2015-10-15 NOTE — Patient Instructions (Addendum)
Nurse visit for injections for back and left leg pain in first week in December , call if you need me before, keep f/u with me as before  INCREASE meloxicam to 15 mg daily, OK to  take TWO 7.5 mg together daily till done  You are referred for physical therapy as well as to Dr Benjamine Mola, re left ear pressure and muffled hearing  Thanks for choosing [facility], we consider it a privelige to serve you.   I will be intouch with Dr Merlene Laughter re concerns and management, and nursing  staff here will get back to you about this

## 2015-10-15 NOTE — Assessment & Plan Note (Addendum)
Increased gait instability and stiffness, of the neck refer to PT, has h/.o fall and fracture

## 2015-10-19 ENCOUNTER — Telehealth: Payer: Self-pay | Admitting: Family Medicine

## 2015-10-19 NOTE — Assessment & Plan Note (Signed)
Controlled, no change in medication  

## 2015-10-19 NOTE — Assessment & Plan Note (Signed)
New onset muffling and pressure in left ear, exam negative for inection, but abnormal,refer ENT for evaluation and further management

## 2015-10-19 NOTE — Assessment & Plan Note (Signed)
Improved pain relief with recent intra articular injections

## 2015-10-19 NOTE — Assessment & Plan Note (Signed)
Uncontrolled, pt hesitant / unwilling to start sinemet recently recomended by neurology, believes her tremor is not so much a problem of Parkinson's and would like my opinion, as well as a 2nd neurology opinion prior to starting the med, s/e are of concern to her I have advised that I will contact neurologist that she just saw, and make him aware of these concerns prior to referring her for a 2nd opinion. My impression clinically is that she has essential tremor, an not any significant features of Parkinson's , cog wheeling is absent and her gait is not classic,  She does however have mild masked facies. Dr Merlene Laughter has responded and feels sinemet is her best option, and I will encourage her to at least try the med, notfocus on whether she does or does not have Parkinson's and see how she does

## 2015-10-19 NOTE — Telephone Encounter (Signed)
Pls let pt know that I have heard from Dr Merlene Laughter re her concerns. Pls let her know  that I encourage her to TRY the medication as the still DOES recommend sinemet as her best option for treatment of the tremors Tell her that I feel that based on his expertise this is the best med for her, she need not focus on whether or not she has a dx of Parkinson's if she still wants a 2nd opinion, refer her to [facility] neurology,  Will sign ?? pls ask

## 2015-10-19 NOTE — Progress Notes (Signed)
Kerry Flynn     MRN: [id number]      DOB: March 21, 1927   HPI Kerry Flynn is here for follow up and re-evaluation of chronic medical conditions, medication management and review of any available recent lab and radiology data.  Preventive health is updated, specifically  Cancer screening and Immunization.   Her main concerns today are to discuss . The PT denies any adverse reactions to current medications since theartreatment options and assessments recently made by the neurologist who evaluated her for tremor and back pain  last visit.  She is wary ob both prescriptions and wants my opinion.States med prescribed is for Parkinson's which she does not believe is her primary problem and s/e worry her, also does not want any strong pain med, is requesting up titration of her mobic if possible. Reports improved knee pain and stiffness following recent intra articular injctions New c/o muffling and decreased hearing in left ear, with pressure denies uncontrolled sinus pressure or recent viral illness  ROS Denies recent fever or chills. Denies sinus pressure, nasal congestion, or sore throat. Denies chest congestion, productive cough or wheezing. Denies chest pains, palpitations and leg swelling Denies abdominal pain, nausea, vomiting,diarrhea or constipation.   Denies dysuria, frequency, hesitancy or uncontrolled  Incontinence.Uses medication  Increased and uncontrolled  joint pain,  and limitation in mobility. Denies headaches, seizures, numbness, or tingling. Denies depression, anxiety or insomnia. Denies skin break down or rash.   PE  BP 132/60 mmHg  Pulse 72  Resp 18  Ht 5\' 6"  (1.676 m)  Wt 140 lb 1.3 oz (63.54 kg)  BMI 22.62 kg/m2  SpO2 98%  Patient alert and oriented and in no cardiopulmonary distress.  HEENT: No facial asymmetry, EOMI,   oropharynx pink and moist.  Neck decreased ROM  no JVD, no mass.Right TM clear , left TM dull, no erythema noted  Chest: Clear to  auscultation bilaterally.  CVS: S1, S2 no murmurs, no S3.Regular rate.  ABD: Soft non tender.   Ext: No edema  AJ:GOTLXBWI reduced  ROM spine, shoulders, hips and knees.Severe kyphosis, abnormal gait, not shuffling  Skin: Intact, no ulcerations or rash noted.  Psych: Good eye contact, normal affect. Memory intact not anxious or depressed appearing.  CNS: CN 2-12 intact, chronic hearing loss, power,  normal throughout.no focal deficits noted.   Assessment & Plan   Unsteady gait Increased gait instability and stiffness, of the neck refer to PT, has h/.o fall and fracture   Tremor Uncontrolled, pt hesitant / unwilling to start sinemet recently recomended by neurology, believes her tremor is not so much a problem of Parkinson's and would like my opinion, as well as a 2nd neurology opinion prior to starting the med, s/e are of concern to her I have advised that I will contact neurologist that she just saw, and make him aware of these concerns prior to referring her for a 2nd opinion. My impression clinically is that she has essential tremor, an not any significant features of Parkinson's , cog wheeling is absent and her gait is not classic,  She does however have mild masked facies. Dr Merlene Laughter has responded and feels sinemet is her best option, and I will encourage her to at least try the med, notfocus on whether she does or does not have Parkinson's and see how she does  Lumbar back pain Uncontrolled and resists tramadol currently, concerned re possible addictive properties which are low, and because of her GERD, and age , extreme caution  with inc NSAID use. Pt is aware of this risk, and elects to use highe dose of meloxicam at his time, s/s of abdominal pain , or GI bleed reviewed  Osteoarthritis of both knees Improved pain relief with recent intra articular injections  Urinary incontinence, urge Controlled, no change in medication   Essential hypertension Controlled, no change  in medication   CAD, NATIVE VESSEL Stable, no c/oangina, or increased exertional fatigue, f/u with cardiology as planned  Hearing loss in left ear New onset muffling and pressure in left ear, exam negative for inection, but abnormal,refer ENT for evaluation and further management

## 2015-10-19 NOTE — Assessment & Plan Note (Addendum)
Uncontrolled and resists tramadol currently, concerned re possible addictive properties which are low, and because of her GERD, and age , extreme caution with inc NSAID use. Pt is aware of this risk, and elects to use highe dose of meloxicam at his time, s/s of abdominal pain , or GI bleed reviewed Return for IM depo medrol and toradol first week in December if needed

## 2015-10-19 NOTE — Assessment & Plan Note (Signed)
Stable, no c/oangina, or increased exertional fatigue, f/u with cardiology as planned

## 2015-10-20 NOTE — Telephone Encounter (Signed)
Spoke with patient and she is aware of advice.  She will call pharmacy and fill medications.  She will call back if she would like referral.

## 2015-10-21 ENCOUNTER — Ambulatory Visit (HOSPITAL_COMMUNITY): Payer: Medicare Other | Attending: Family Medicine | Admitting: Physical Therapy

## 2015-10-21 ENCOUNTER — Telehealth: Payer: Self-pay | Admitting: *Deleted

## 2015-10-21 DIAGNOSIS — Z9181 History of falling: Secondary | ICD-10-CM

## 2015-10-21 DIAGNOSIS — R6889 Other general symptoms and signs: Secondary | ICD-10-CM | POA: Insufficient documentation

## 2015-10-21 DIAGNOSIS — R262 Difficulty in walking, not elsewhere classified: Secondary | ICD-10-CM | POA: Diagnosis present

## 2015-10-21 DIAGNOSIS — M4004 Postural kyphosis, thoracic region: Secondary | ICD-10-CM | POA: Diagnosis present

## 2015-10-21 DIAGNOSIS — R29898 Other symptoms and signs involving the musculoskeletal system: Secondary | ICD-10-CM | POA: Diagnosis present

## 2015-10-21 DIAGNOSIS — R2681 Unsteadiness on feet: Secondary | ICD-10-CM | POA: Insufficient documentation

## 2015-10-21 NOTE — Telephone Encounter (Signed)
Patient called stating she would like a second opioin for parkinson's

## 2015-10-21 NOTE — Patient Instructions (Signed)
Bridging    Slowly raise buttocks from floor, keeping stomach tight. Repeat __10__ times per set. Do __1__ sets per session. Do _1___ sessions per day.  http://orth.exer.us/1097   Copyright  VHI. All rights reserved.  ABDUCTION: Standing (Active)    Stand, feet flat. Lift right leg out to side.  Complete _1__ sets of _10__ repetitions. Perform _1__ sessions per day.  http://gtsc.exer.us/111   Copyright  VHI. All rights reserved.  Heel Raises    Stand with support. With knees straight, raise heels off ground.  Repeat _10__ times. Do _1__ times a day.  Copyright  VHI. All rights reserved.

## 2015-10-21 NOTE — Telephone Encounter (Signed)
pls refer [facility] for management of tremor, and 2nd opinion on medication recommended, I will sign. pLS note, if she has a specific neurologist she wishes to see pls refer her to that MD

## 2015-10-21 NOTE — Therapy (Signed)
[place], [place], [address] Phone: [phone]   Fax:  [phone]  Physical Therapy Evaluation  Patient Details  Name: Kerry Flynn MRN: [id number] Date of Birth: 02-28-1927 Referring Provider: Moshe Cipro  Encounter Date: 10/21/2015      PT End of Session - 10/21/15 1208    Visit Number 1   Number of Visits 12   Date for PT Re-Evaluation 11/21/15   Authorization Type UHC Medicare   Authorization Time Period 10/21/15-12/20/15   Authorization - Visit Number 1   Authorization - Number of Visits 10   PT Start Time 1102   PT Stop Time 1158   PT Time Calculation (min) 56 min   Equipment Utilized During Treatment Gait belt   Activity Tolerance Patient tolerated treatment well   Behavior During Therapy [facility] for tasks assessed/performed      Past Medical History  Diagnosis Date  . Hemorrhoids   . Intermittent vertigo   . Osteoporosis     knees  . Benign recurrent vertigo   . Vitamin D deficiency   . Head trauma     status post fall; upper and lower extremities   . GERD (gastroesophageal reflux disease)   . Diverticulosis   . Chronic constipation   . Dermatitis     recurrent  . Hypertension   . Barrett's esophagus     Dr. Laural Golden, Dr. Moshe Cipro  . Infertility, female   . Peripheral edema     chronic  . Chronic knee pain   . Fatigue     chronic  . Anemia   . Hip fracture, left ([place])     s/p left arthroplasty 2/16  . Osteoarthritis     of the neck  . DDD (degenerative disc disease), lumbar   . DDD (degenerative disc disease), cervical   . Varicose veins     Past Surgical History  Procedure Laterality Date  . Knee arthroscopy Right 1999  . Knee arthroscopy Left 1996  . Cholecystectomy, laparoscopic  06/17/2006    with stones  . Rotator cuff repair  05/14/2010  . Cataract extraction w/ intraocular lens implant Left 01/06/2012    left, [facility]  . Eye surgery  01/24/2012    bilateral  cataract extraction  . Colonoscopy  04/19/2012    Procedure: COLONOSCOPY;  Surgeon: Rogene Houston, MD;  [facility];  Service: Endoscopy;  Laterality: N/A;  1200  . Tonsillectomy and adenoidectomy  1934  . Palate surgery  1988  . Dental implant   09/2014  . Hip arthroplasty Left 01/31/2015    Procedure: ARTHROPLASTY BIPOLAR HIP;  Surgeon: Carole Civil, MD;  Location: [facility] ORS;  Service: Orthopedics;  Laterality: Left;  . Colonoscopy w/ polypectomy  05/29/1999, 01/12/2012  . Esophagogastroduodenoscopy  01/12/2012  . Upper gi endoscopy  12/12/2006    with biopsy, with colonoscopy - dx: Barrett's disease  . Transthoracic echocardiogram  01/2012    EF=>55%; mild mitral annular calcification & mild MR; trace TR; trace pulm valve regurg  . Nm myocar perf wall motion  02/2012    lexiscan - small, fized apical lateral bowel attenuation artifact; no reversible ischemia; EF 77%; non-diagnostic for ischemia; low risk  . Cardiac catheterization  09/02/2005    mild-mod calcification in mid LAD (20-30% luminal obstruction), otherwise normal coronaries, patent LE arteries (Dr. Jackie Plum)  . Carotid doppler  02/2008    R & L ICAs 0-49% diameter reduction  . Cholecystectomy    .  Tonsillectomy    . Colonoscopy N/A 09/25/2015    Procedure: COLONOSCOPY;  Surgeon: Rogene Houston, MD;  Location: [facility];  Service: Endoscopy;  Laterality: N/A;  100    There were no vitals filed for this visit.  Visit Diagnosis:  Unsteady gait  Difficulty walking  Decreased functional activity tolerance  Weakness of both legs  Postural kyphosis of thoracic region  Risk for falls      Subjective Assessment - 10/21/15 1110    Subjective Pt reports that she would really like to be able to stand up straight when she is walking.  She reports that she is unable to walk for extended length of time before getting tired. Pt denies any recent falls. She did have a fall in February and broke her hip, but has not  had any falls in the past 6 months. She has the most difficulty with being able to look up when walking. Pt reports that she has bad arthritis in both knees, and has considered getting both knees replaced. A couple of weeks ago, pt was diagnosed with Parkinson's, but wants to get a second opinion.    Pertinent History Partial hip replacement 01/31/15 on LLE.    How long can you stand comfortably? 20 minutes   How long can you walk comfortably? 5 minutes   Patient Stated Goals Be able to walk long enough to go grocery shopping   Currently in Pain? Yes   Pain Score 6    Pain Location Knee   Pain Orientation Right;Left   Pain Descriptors / Indicators Aching            [facility] PT Assessment - 10/21/15 0001    Assessment   Medical Diagnosis Unsteady gait   Referring Provider Moshe Cipro   Next MD Visit December   Prior Therapy yes- following hip replacement   Balance Screen   Has the patient fallen in the past 6 months No   Has the patient had a decrease in activity level because of a fear of falling?  Yes   Is the patient reluctant to leave their home because of a fear of falling?  Yes   [place] there 8 hours per day   Available Help at Discharge Personal care attendant   Type of [place] Access Level entry   [place] Two level;Laundry or work area in basement;Bed/bath upstairs  split level home   Actor --  Lift chair   Prior Function   Level of Independence Independent with basic ADLs;Independent with household mobility with device;Needs assistance with homemaking   Vocation Retired   Leisure Enjoys watching TV   Observation/Other Assessments   Focus on Therapeutic Outcomes (FOTO)  51% limited   Posture/Postural Control   Posture/Postural Control Postural limitations   Postural Limitations Rounded Shoulders;Forward head;Increased thoracic kyphosis   ROM / Strength   AROM /  PROM / Strength Strength   Strength   Strength Assessment Site Hip;Knee   Right/Left Hip Right;Left   Right Hip Flexion 4/5   Right Hip Extension 3-/5   Right Hip ABduction 3+/5   Left Hip Flexion 3/5   Left Hip Extension 2/5   Left Hip ABduction 3-/5   Right/Left Knee Right;Left   Right Knee Flexion 4/5   Right Knee Extension 4+/5   Left Knee Flexion 3/5   Left Knee Extension 4+/5   Transfers  Five time sit to stand comments  47.93" with UE support   6 minute walk test results    Aerobic Endurance Distance Walked 175   Endurance additional comments 3 minute walk test   Standardized Balance Assessment   Standardized Balance Assessment Timed Up and Go Test   Timed Up and Go Test   Normal TUG (seconds) 32.08              PT Education - 10/21/15 1200    Education provided Yes   Education Details HEP, prognosis, POC   Person(s) Educated Patient;Caregiver(s)   Methods Explanation   Comprehension Verbalized understanding          PT Short Term Goals - 10/21/15 1214    PT SHORT TERM GOAL #1   Title Pt will be independent with HEP.    Time 3   Period Weeks   Status New   PT SHORT TERM GOAL #2   Title Improve BLE strength as evidenced by five time sit to stand time of 35 seconds or less with no UE support.    Time 3   Period Weeks   Status New   PT SHORT TERM GOAL #3   Title Improve functional activity tolerance evidenced by pt ambulating 275 feet or more on 3 minute walk test.    Time 3   Period Weeks   Status New   PT SHORT TERM GOAL #4   Title Decrease fall risk per TUG time of 25 seconds or less.    Time 3   Period Weeks   Status New           PT Long Term Goals - 10/21/15 1215    PT LONG TERM GOAL #1   Title Pt will be independent with advanced HEP for functional strengthening, improving posture, and improving balance.    Time 6   Period Weeks   Status New   PT LONG TERM GOAL #2   Title Improve BLE strength evidenced by five time sit to stand  time of 20 seconds or less with no UE support.    Time 6   Period Weeks   Status New   PT LONG TERM GOAL #3   Title Improve functional activity tolerance evidenced by pt ambulating 450' during 3 minute walk test.    Time 6   Period Weeks   Status New   PT LONG TERM GOAL #4   Title Decrease fall risk per TUG time of 18 seconds or less.    Time 6   Period Weeks   Status New   PT LONG TERM GOAL #5   Title Pt will report that she has completed grocery shopping with caregiver, ambulating through grocery store with rollator and <3 rest breaks.    Time 6   Period Weeks   Status New               Plan - 10/21/15 1209    Clinical Impression Statement Pt presents to PT with c/o unsteadiness when walking, decreased endurance, fear of falling, and weakness. Upon examination, pt demonstrates weakness of BLE, L>R, decreased functional activity tolerance evidenced by 175' ambulated during 3 minute walk test, increased fall risk per TUG time of 32.08", and decreased functional mobility and strength evidenced by five time sit to stand time of 47.93" with UE assist. Pt also demonstrates increased thoracic kyphosis, which is increasing her fall risk with ambulation. Pt will benefit from postural strengthening, BLE strengthening, balance training, and  functional activity tolerance training in order to address her impairments. Skilled physical therapy services are necessary at this time to prevent further functional decline and return pt to optimal level of function.    Pt will benefit from skilled therapeutic intervention in order to improve on the following deficits Decreased activity tolerance;Decreased balance;Abnormal gait;Decreased endurance;Decreased mobility;Decreased strength;Difficulty walking;Postural dysfunction   Rehab Potential Good   PT Frequency 2x / week   PT Duration 6 weeks   PT Treatment/Interventions ADLs/Self Care Home Management;Moist Heat;Gait training;Stair training;Functional  mobility training;Therapeutic activities;Therapeutic exercise;Balance training;Neuromuscular re-education;Patient/family education;Manual techniques   PT Next Visit Plan Review HEP, continue with BLE strengthening, begin postural strengthening          G-Codes - 2015/10/28 03-Apr-1218    Functional Assessment Tool Used FOTO, clinical judgement   Functional Limitation Mobility: Walking and moving around   Mobility: Walking and Moving Around Goal Status [phone]) At least 60 percent but less than 80 percent impaired, limited or restricted   Mobility: Walking and Moving Around Discharge Status [phone]) At least 40 percent but less than 60 percent impaired, limited or restricted       Problem List Patient Active Problem List   Diagnosis Date Noted  . Unsteady gait 10/15/2015  . Hearing loss in left ear 10/15/2015  . Osteoarthritis of both knees 08/27/2015  . Tremor 08/27/2015  . Depression 03/25/2015  . Anemia 01/31/2015  . Femoral neck fracture ([place]) 01/31/2015  . Faintness   . Fall at home 04/04/2014  . Multiple thyroid nodules 04/04/2014  . GERD (gastroesophageal reflux disease) 04/04/2014  . Postmenopausal atrophic vaginitis 03/12/2014  . Vitamin D deficiency 03/12/2014  . Osteopetrosis 10/23/2013  . Urinary incontinence, urge 10/23/2013  . Thoracic back pain 10/23/2013  . Lumbar back pain 10/23/2013  . Colon polyps 02/27/2012  . CAD, NATIVE VESSEL 12/12/2009  . Bilateral carotid bruits 12/12/2009  . ALLERGIC URTICARIA 09/09/2009  . Osteoarthrosis, unspecified whether generalized or localized, involving lower leg 04/19/2009  . Essential hypertension 10/16/2008  . FATIGUE 10/16/2008  . HEMORRHOIDS 03/29/2008  . INTERMITTENT VERTIGO 03/29/2008    Hilma Favors, PT, DPT [phone] 2015-10-28, 12:20 PM  [place], [place], [address] Phone: [phone]   Fax:  [phone]  Name: Kerry Flynn MRN:  [id number] Date of Birth: 01/07/1927

## 2015-10-22 NOTE — Telephone Encounter (Signed)
Called patient and left message for them to return call at the office   

## 2015-10-23 ENCOUNTER — Ambulatory Visit (HOSPITAL_COMMUNITY): Payer: Medicare Other | Admitting: Physical Therapy

## 2015-10-23 DIAGNOSIS — R2681 Unsteadiness on feet: Secondary | ICD-10-CM

## 2015-10-23 DIAGNOSIS — R262 Difficulty in walking, not elsewhere classified: Secondary | ICD-10-CM

## 2015-10-23 DIAGNOSIS — R6889 Other general symptoms and signs: Secondary | ICD-10-CM

## 2015-10-23 DIAGNOSIS — Z9181 History of falling: Secondary | ICD-10-CM

## 2015-10-23 DIAGNOSIS — R29898 Other symptoms and signs involving the musculoskeletal system: Secondary | ICD-10-CM

## 2015-10-23 DIAGNOSIS — M4004 Postural kyphosis, thoracic region: Secondary | ICD-10-CM

## 2015-10-23 NOTE — Therapy (Signed)
[place], [place], [address] Phone: [phone]   Fax:  [phone]  Physical Therapy Treatment  Patient Details  Name: Kerry Flynn MRN: [id number] Date of Birth: [birth year] Referring Provider: Moshe Cipro  Encounter Date: 10/23/2015      PT End of Session - 10/23/15 1624    Visit Number 2   Number of Visits 12   Date for PT Re-Evaluation 11/21/15   Authorization Type UHC Medicare   Authorization Time Period 10/21/15-12/20/15   Authorization - Visit Number 2   Authorization - Number of Visits 10   PT Start Time 4431   PT Stop Time 1605   PT Time Calculation (min) 50 min   Equipment Utilized During Treatment Gait belt   Activity Tolerance Patient tolerated treatment well   Behavior During Therapy [facility] for tasks assessed/performed      Past Medical History  Diagnosis Date  . Hemorrhoids   . Intermittent vertigo   . Osteoporosis     knees  . Benign recurrent vertigo   . Vitamin D deficiency   . Head trauma     status post fall; upper and lower extremities   . GERD (gastroesophageal reflux disease)   . Diverticulosis   . Chronic constipation   . Dermatitis     recurrent  . Hypertension   . Barrett's esophagus     Dr. Laural Golden, Dr. Moshe Cipro  . Infertility, female   . Peripheral edema     chronic  . Chronic knee pain   . Fatigue     chronic  . Anemia   . Hip fracture, left ([place])     s/p left arthroplasty 2/16  . Osteoarthritis     of the neck  . DDD (degenerative disc disease), lumbar   . DDD (degenerative disc disease), cervical   . Varicose veins     Past Surgical History  Procedure Laterality Date  . Knee arthroscopy Right 1999  . Knee arthroscopy Left 1996  . Cholecystectomy, laparoscopic  06/17/2006    with stones  . Rotator cuff repair  05/14/2010  . Cataract extraction w/ intraocular lens implant Left 01/06/2012    left, [facility]  . Eye surgery  01/24/2012    bilateral cataract  extraction  . Colonoscopy  04/19/2012    Procedure: COLONOSCOPY;  Surgeon: Rogene Houston, MD;  Location[facility];  Service: Endoscopy;  Laterality: N/A;  1200  . Tonsillectomy and adenoidectomy  1934  . Palate surgery  1988  . Dental implant   09/2014  . Hip arthroplasty Left 01/31/2015    Procedure: ARTHROPLASTY BIPOLAR HIP;  Surgeon: Carole Civil, MD;  Location: [facility] ORS;  Service: Orthopedics;  Laterality: Left;  . Colonoscopy w/ polypectomy  05/29/1999, 01/12/2012  . Esophagogastroduodenoscopy  01/12/2012  . Upper gi endoscopy  12/12/2006    with biopsy, with colonoscopy - dx: Barrett's disease  . Transthoracic echocardiogram  01/2012    EF=>55%; mild mitral annular calcification & mild MR; trace TR; trace pulm valve regurg  . Nm myocar perf wall motion  02/2012    lexiscan - small, fized apical lateral bowel attenuation artifact; no reversible ischemia; EF 77%; non-diagnostic for ischemia; low risk  . Cardiac catheterization  09/02/2005    mild-mod calcification in mid LAD (20-30% luminal obstruction), otherwise normal coronaries, patent LE arteries (Dr. Jackie Plum)  . Carotid doppler  02/2008    R & L ICAs 0-49% diameter reduction  . Cholecystectomy    .  Tonsillectomy    . Colonoscopy N/A 09/25/2015    Procedure: COLONOSCOPY;  Surgeon: Rogene Houston, MD;  [facility];  Service: Endoscopy;  Laterality: N/A;  100    There were no vitals filed for this visit.  Visit Diagnosis:  Unsteady gait  Difficulty walking  Decreased functional activity tolerance  Weakness of both legs  Postural kyphosis of thoracic region  Risk for falls      Subjective Assessment - 10/23/15 1515    Subjective Pt reports that her knees are a little sore. At some point during her time in therapy, she would like to practice getting up and down from the floor, but not today due to knee soreness.    Currently in Pain? Yes   Pain Score 7    Pain Location Knee                  [facility] Adult PT Treatment/Exercise - 10/23/15 0001    Exercises   Exercises Lumbar;Knee/Hip   Lumbar Exercises: Standing   Scapular Retraction 10 reps;Theraband   Theraband Level (Scapular Retraction) Level 2 (Red)   Row 10 reps;Theraband   Theraband Level (Row) Level 2 (Red)   Shoulder Extension 10 reps;Theraband   Theraband Level (Shoulder Extension) Level 2 (Red)   Lumbar Exercises: Seated   Sit to Stand 10 reps   Sit to Stand Limitations no UE from 24" mat table   Other Seated Lumbar Exercises seated thoracic extension in chair   Lumbar Exercises: Supine   Bridge 15 reps   Straight Leg Raise 10 reps   Knee/Hip Exercises: Seated   Long Arc Quad Strengthening;Both;15 reps   Long Arc Quad Weight 3 lbs.                PT Education - 10/23/15 1623    Education provided Yes   Education Details Pt and caregiver educated on donning knee brace bilaterally, HEP given for postural strengthening   Person(s) Educated Patient;Caregiver(s)   Methods Explanation;Handout   Comprehension Verbalized understanding          PT Short Term Goals - 10/21/15 1214    PT SHORT TERM GOAL #1   Title Pt will be independent with HEP.    Time 3   Period Weeks   Status New   PT SHORT TERM GOAL #2   Title Improve BLE strength as evidenced by five time sit to stand time of 35 seconds or less with no UE support.    Time 3   Period Weeks   Status New   PT SHORT TERM GOAL #3   Title Improve functional activity tolerance evidenced by pt ambulating 275 feet or more on 3 minute walk test.    Time 3   Period Weeks   Status New   PT SHORT TERM GOAL #4   Title Decrease fall risk per TUG time of 25 seconds or less.    Time 3   Period Weeks   Status New           PT Long Term Goals - 10/21/15 1215    PT LONG TERM GOAL #1   Title Pt will be independent with advanced HEP for functional strengthening, improving posture, and improving balance.    Time 6   Period Weeks   Status New   PT  LONG TERM GOAL #2   Title Improve BLE strength evidenced by five time sit to stand time of 20 seconds or less with no UE  support.    Time 6   Period Weeks   Status New   PT LONG TERM GOAL #3   Title Improve functional activity tolerance evidenced by pt ambulating 450' during 3 minute walk test.    Time 6   Period Weeks   Status New   PT LONG TERM GOAL #4   Title Decrease fall risk per TUG time of 18 seconds or less.    Time 6   Period Weeks   Status New   PT LONG TERM GOAL #5   Title Pt will report that she has completed grocery shopping with caregiver, ambulating through grocery store with rollator and <3 rest breaks.    Time 6   Period Weeks   Status New               Plan - 10/23/15 1624    Clinical Impression Statement Treatment session focused on functional strengthening, functional mobility, and improving posture to improve pt's ability to maintain upright position when ambulating. Pt required verbal and tactile cueing for proper form during all postural strengthening to maintain proper posture throughout the exercise. Pt demonstrated poor safety awareness in today's treatment session, failing to lock brakes on rollator when sitting. Pt and caregiver education was provided regarding safety with locking brakes and with donning knee braces given to pt by her MD. Pt and caregiver verbalized understanding.    PT Next Visit Plan Continue with BLE strengthening, postural strengthening, begin balance training        Problem List Patient Active Problem List   Diagnosis Date Noted  . Unsteady gait 10/15/2015  . Hearing loss in left ear 10/15/2015  . Osteoarthritis of both knees 08/27/2015  . Tremor 08/27/2015  . Depression 03/25/2015  . Anemia 01/31/2015  . Femoral neck fracture ([place]) 01/31/2015  . Faintness   . Fall at home 04/04/2014  . Multiple thyroid nodules 04/04/2014  . GERD (gastroesophageal reflux disease) 04/04/2014  . Postmenopausal atrophic vaginitis  03/12/2014  . Vitamin D deficiency 03/12/2014  . Osteopetrosis 10/23/2013  . Urinary incontinence, urge 10/23/2013  . Thoracic back pain 10/23/2013  . Lumbar back pain 10/23/2013  . Colon polyps 02/27/2012  . CAD, NATIVE VESSEL 12/12/2009  . Bilateral carotid bruits 12/12/2009  . ALLERGIC URTICARIA 09/09/2009  . Osteoarthrosis, unspecified whether generalized or localized, involving lower leg 04/19/2009  . Essential hypertension 10/16/2008  . FATIGUE 10/16/2008  . HEMORRHOIDS 03/29/2008  . INTERMITTENT VERTIGO 03/29/2008    Hilma Favors, PT, DPT [phone] 10/23/2015, 4:32 PM  [place], [place], [address] Phone: [phone]   Fax:  [phone]  Name: Kerry Flynn MRN: [id number] Date of Birth: August 22, 1927

## 2015-10-25 NOTE — Addendum Note (Signed)
Addended by: Tula Nakayama E on: 10/25/2015 11:37 AM   Modules accepted: Level of Service

## 2015-10-28 ENCOUNTER — Ambulatory Visit (HOSPITAL_COMMUNITY): Payer: Medicare Other | Attending: Family Medicine | Admitting: Physical Therapy

## 2015-10-28 DIAGNOSIS — R262 Difficulty in walking, not elsewhere classified: Secondary | ICD-10-CM | POA: Diagnosis present

## 2015-10-28 DIAGNOSIS — Z9181 History of falling: Secondary | ICD-10-CM | POA: Insufficient documentation

## 2015-10-28 DIAGNOSIS — R29898 Other symptoms and signs involving the musculoskeletal system: Secondary | ICD-10-CM | POA: Diagnosis present

## 2015-10-28 DIAGNOSIS — R2681 Unsteadiness on feet: Secondary | ICD-10-CM | POA: Diagnosis not present

## 2015-10-28 DIAGNOSIS — M4004 Postural kyphosis, thoracic region: Secondary | ICD-10-CM | POA: Diagnosis present

## 2015-10-28 DIAGNOSIS — R6889 Other general symptoms and signs: Secondary | ICD-10-CM | POA: Diagnosis present

## 2015-10-28 NOTE — Therapy (Signed)
[place], [place], [address] Phone: [phone]   Fax:  [phone]  Physical Therapy Treatment  Patient Details  Name: Kerry Flynn MRN: [id number] Date of Birth: 02-02-1927 Referring Provider: Moshe Cipro  Encounter Date: 10/28/2015      PT End of Session - 10/28/15 1742    Visit Number 3   Number of Visits 12   Date for PT Re-Evaluation 11/21/15   Authorization Type [employer] Medicare   Authorization Time Period 10/21/15-12/20/15   Authorization - Visit Number 3   Authorization - Number of Visits 10   PT Start Time 3546   PT Stop Time 1430   PT Time Calculation (min) 45 min   Equipment Utilized During Treatment Gait belt   Activity Tolerance Patient tolerated treatment well   Behavior During Therapy [facility] for tasks assessed/performed      Past Medical History  Diagnosis Date  . Hemorrhoids   . Intermittent vertigo   . Osteoporosis     knees  . Benign recurrent vertigo   . Vitamin D deficiency   . Head trauma     status post fall; upper and lower extremities   . GERD (gastroesophageal reflux disease)   . Diverticulosis   . Chronic constipation   . Dermatitis     recurrent  . Hypertension   . Barrett's esophagus     Dr. Laural Golden, Dr. Moshe Cipro  . Infertility, female   . Peripheral edema     chronic  . Chronic knee pain   . Fatigue     chronic  . Anemia   . Hip fracture, left ([place])     s/p left arthroplasty 2/16  . Osteoarthritis     of the neck  . DDD (degenerative disc disease), lumbar   . DDD (degenerative disc disease), cervical   . Varicose veins     Past Surgical History  Procedure Laterality Date  . Knee arthroscopy Right 1999  . Knee arthroscopy Left 1996  . Cholecystectomy, laparoscopic  06/17/2006    with stones  . Rotator cuff repair  05/14/2010  . Cataract extraction w/ intraocular lens implant Left 01/06/2012    left, [facility]  . Eye surgery  01/24/2012    bilateral cataract  extraction  . Colonoscopy  04/19/2012    Procedure: COLONOSCOPY;  Surgeon: Rogene Houston, MD;  Location: [facility];  Service: Endoscopy;  Laterality: N/A;  1200  . Tonsillectomy and adenoidectomy  1934  . Palate surgery  1988  . Dental implant   09/2014  . Hip arthroplasty Left 01/31/2015    Procedure: ARTHROPLASTY BIPOLAR HIP;  Surgeon: Carole Civil, MD;  Location: [facility] ORS;  Service: Orthopedics;  Laterality: Left;  . Colonoscopy w/ polypectomy  05/29/1999, 01/12/2012  . Esophagogastroduodenoscopy  01/12/2012  . Upper gi endoscopy  12/12/2006    with biopsy, with colonoscopy - dx: Barrett's disease  . Transthoracic echocardiogram  01/2012    EF=>55%; mild mitral annular calcification & mild MR; trace TR; trace pulm valve regurg  . Nm myocar perf wall motion  02/2012    lexiscan - small, fized apical lateral bowel attenuation artifact; no reversible ischemia; EF 77%; non-diagnostic for ischemia; low Flynn  . Cardiac catheterization  09/02/2005    mild-mod calcification in mid LAD (20-30% luminal obstruction), otherwise normal coronaries, patent LE arteries (Dr. Jackie Plum)  . Carotid doppler  02/2008    R & L ICAs 0-49% diameter reduction  . Cholecystectomy    .  Tonsillectomy    . Colonoscopy N/A 09/25/2015    Procedure: COLONOSCOPY;  Surgeon: Rogene Houston, MD;  Location: [facility];  Service: Endoscopy;  Laterality: N/A;  100    There were no vitals filed for this visit.  Visit Diagnosis:  Unsteady gait  Difficulty walking  Decreased functional activity tolerance  Weakness of both legs      Subjective Assessment - 10/28/15 1348    Subjective Pt reports that she feels a little otu of it today. She also has some soreness in her knees. Pt and caregiver report that pt went up steps yesterday without difficulty.    Currently in Pain? Yes   Pain Score 4   increases to 7/10 when walking   Pain Location Knee   Pain Orientation Right;Left              [facility] Adult PT  Treatment/Exercise - 10/28/15 0001    Lumbar Exercises: Standing   Heel Raises 15 reps   Scapular Retraction 10 reps;Theraband   Theraband Level (Scapular Retraction) Level 2 (Red)   Row 10 reps;Theraband   Theraband Level (Row) Level 2 (Red)   Shoulder Extension 10 reps;Theraband   Theraband Level (Shoulder Extension) Level 2 (Red)   Lumbar Exercises: Seated   Long Arc Quad on Chair Both;10 reps;2 sets;Weights   LAQ on Chair Weights (lbs) 2   Sit to Stand 5 reps  stopped due to pain   Other Seated Lumbar Exercises seated thoracic extension in chair x 10, seated chin tucks x 10   Knee/Hip Exercises: Standing   Hip Abduction 10 reps;Both                  PT Short Term Goals - 10/21/15 1214    PT SHORT TERM GOAL #1   Title Pt will be independent with HEP.    Time 3   Period Weeks   Status New   PT SHORT TERM GOAL #2   Title Improve BLE strength as evidenced by five time sit to stand time of 35 seconds or less with no UE support.    Time 3   Period Weeks   Status New   PT SHORT TERM GOAL #3   Title Improve functional activity tolerance evidenced by pt ambulating 275 feet or more on 3 minute walk test.    Time 3   Period Weeks   Status New   PT SHORT TERM GOAL #4   Title Decrease fall Flynn per TUG time of 25 seconds or less.    Time 3   Period Weeks   Status New           PT Long Term Goals - 10/21/15 1215    PT LONG TERM GOAL #1   Title Pt will be independent with advanced HEP for functional strengthening, improving posture, and improving balance.    Time 6   Period Weeks   Status New   PT LONG TERM GOAL #2   Title Improve BLE strength evidenced by five time sit to stand time of 20 seconds or less with no UE support.    Time 6   Period Weeks   Status New   PT LONG TERM GOAL #3   Title Improve functional activity tolerance evidenced by pt ambulating 450' during 3 minute walk test.    Time 6   Period Weeks   Status New   PT LONG TERM GOAL #4    Title Decrease fall Flynn per TUG  time of 18 seconds or less.    Time 6   Period Weeks   Status New   PT LONG TERM GOAL #5   Title Pt will report that she has completed grocery shopping with caregiver, ambulating through grocery store with rollator and <3 rest breaks.    Time 6   Period Weeks   Status New               Plan - 10/28/15 1743    Clinical Impression Statement Continued focus on improving posture and functional strength. Pt required CGA for proper balance with standing postural strengthening for safety. Pt was unable to complete 10 repetitions of STS due to increased pain in bilateral knees, and had to stop after 5 repetitions. Verbal and tactile cueing were required throughout treatment for proper upright posture when completing standing and seated exercise.    PT Next Visit Plan Begin balance training, continue with postural strengthening        Problem List Patient Active Problem List   Diagnosis Date Noted  . Unsteady gait 10/15/2015  . Hearing loss in left ear 10/15/2015  . Osteoarthritis of both knees 08/27/2015  . Tremor 08/27/2015  . Depression 03/25/2015  . Anemia 01/31/2015  . Femoral neck fracture ([place]) 01/31/2015  . Faintness   . Fall at home 04/04/2014  . Multiple thyroid nodules 04/04/2014  . GERD (gastroesophageal reflux disease) 04/04/2014  . Postmenopausal atrophic vaginitis 03/12/2014  . Vitamin D deficiency 03/12/2014  . Osteopetrosis 10/23/2013  . Urinary incontinence, urge 10/23/2013  . Thoracic back pain 10/23/2013  . Lumbar back pain 10/23/2013  . Colon polyps 02/27/2012  . CAD, NATIVE VESSEL 12/12/2009  . Bilateral carotid bruits 12/12/2009  . ALLERGIC URTICARIA 09/09/2009  . Osteoarthrosis, unspecified whether generalized or localized, involving lower leg 04/19/2009  . Essential hypertension 10/16/2008  . FATIGUE 10/16/2008  . HEMORRHOIDS 03/29/2008  . INTERMITTENT VERTIGO 03/29/2008    Hilma Favors, PT,  DPT [id number] 10/28/2015, 5:47 PM  [place], [place], [address] Phone: [phone]   Fax:  [phone]  Name: DELINDA MALAN MRN: [id number] Date of Birth: May 25, 1927

## 2015-10-30 ENCOUNTER — Ambulatory Visit (HOSPITAL_COMMUNITY): Payer: Medicare Other | Admitting: Physical Therapy

## 2015-10-30 DIAGNOSIS — M4004 Postural kyphosis, thoracic region: Secondary | ICD-10-CM

## 2015-10-30 DIAGNOSIS — R29898 Other symptoms and signs involving the musculoskeletal system: Secondary | ICD-10-CM

## 2015-10-30 DIAGNOSIS — R2681 Unsteadiness on feet: Secondary | ICD-10-CM

## 2015-10-30 DIAGNOSIS — R6889 Other general symptoms and signs: Secondary | ICD-10-CM

## 2015-10-30 DIAGNOSIS — Z9181 History of falling: Secondary | ICD-10-CM

## 2015-10-30 DIAGNOSIS — R262 Difficulty in walking, not elsewhere classified: Secondary | ICD-10-CM

## 2015-10-30 NOTE — Therapy (Signed)
[place]erry Flynn, [place], [address] Phone: [phone]   Fax:  [phone]  Physical Therapy Treatment  Patient Details  Name: Kerry Flynn MRN: [id number] Date of Birth: 09/06/1927 Referring Provider: Moshe Cipro  Encounter Date: 10/30/2015      PT End of Session - 10/30/15 1659    Visit Number 4   Number of Visits 12   Date for PT Re-Evaluation 11/21/15   Authorization Type [employer]HC Medicare   Authorization Time Period 10/21/15-12/20/15   Authorization - Visit Number 4   Authorization - Number of Visits 10   PT Start Time 5638   PT Stop Time 1427   PT Time Calculation (min) 42 min   Equipment Utilized During Treatment Gait belt   Activity Tolerance Patient tolerated treatment well   Behavior During Therapy [facility] for tasks assessed/performed      Past Medical History  Diagnosis Date  . Hemorrhoids   . Intermittent vertigo   . Osteoporosis     knees  . Benign recurrent vertigo   . Vitamin D deficiency   . Head trauma     status post fall; upper and lower extremities   . GERD (gastroesophageal reflux disease)   . Diverticulosis   . Chronic constipation   . Dermatitis     recurrent  . Hypertension   . Barrett's esophagus     Dr. Laural Golden, Dr. Moshe Cipro  . Infertility, female   . Peripheral edema     chronic  . Chronic knee pain   . Fatigue     chronic  . Anemia   . Hip fracture, left ([place])     s/p left arthroplasty 2/16  . Osteoarthritis     of the neck  . DDD (degenerative disc disease), lumbar   . DDD (degenerative disc disease), cervical   . Varicose veins     Past Surgical History  Procedure Laterality Date  . Knee arthroscopy Right 1999  . Knee arthroscopy Left 1996  . Cholecystectomy, laparoscopic  06/17/2006    with stones  . Rotator cuff repair  05/14/2010  . Cataract extraction w/ intraocular lens implant Left 01/06/2012    left, [facility]  . Eye surgery  01/24/2012    bilateral cataract  extraction  . Colonoscopy  04/19/2012    Procedure: COLONOSCOPY;  Surgeon: Rogene Houston, MD;  [facility];  Service: Endoscopy;  Laterality: N/A;  1200  . Tonsillectomy and adenoidectomy  1934  . Palate surgery  1988  . Dental implant   09/2014  . Hip arthroplasty Left 01/31/2015    Procedure: ARTHROPLASTY BIPOLAR HIP;  Surgeon: Carole Civil, MD;  Location: [facility];  Service: Orthopedics;  Laterality: Left;  . Colonoscopy w/ polypectomy  05/29/1999, 01/12/2012  . Esophagogastroduodenoscopy  01/12/2012  . Upper gi endoscopy  12/12/2006    with biopsy, with colonoscopy - dx: Barrett's disease  . Transthoracic echocardiogram  01/2012    EF=>55%; mild mitral annular calcification & mild MR; trace TR; trace pulm valve regurg  . Nm myocar perf wall motion  02/2012    lexiscan - small, fized apical lateral bowel attenuation artifact; no reversible ischemia; EF 77%; non-diagnostic for ischemia; low risk  . Cardiac catheterization  09/02/2005    mild-mod calcification in mid LAD (20-30% luminal obstruction), otherwise normal coronaries, patent LE arteries (Dr. Jackie Plum)  . Carotid doppler  02/2008    R & L ICAs 0-49% diameter reduction  . Cholecystectomy    .  Tonsillectomy    . Colonoscopy N/A 09/25/2015    Procedure: COLONOSCOPY;  Surgeon: Rogene Houston, MD;  [facility];  Service: Endoscopy;  Laterality: N/A;  100    There were no vitals filed for this visit.  Visit Diagnosis:  Unsteady gait  Difficulty walking  Decreased functional activity tolerance  Weakness of both legs  Postural kyphosis of thoracic region  Risk for falls      Subjective Assessment - 10/30/15 1348    Subjective Patient reports that her knees are still sore and bothering her; reports that some days stairs are very easy and some days are still difficult    Pertinent History Partial hip replacement 01/31/15 on LLE.    Patient Stated Goals Be able to walk long enough to go grocery shopping    Currently in Pain? Yes   Pain Score 5    Pain Location Knee   Pain Orientation Right;Left                         [facility] Adult PT Treatment/Exercise - 10/30/15 0001    Lumbar Exercises: Seated   Other Seated Lumbar Exercises seated cone rotations and forward cone reaches with tactile facilitation    Lumbar Exercises: Quadruped   [place] 5 reps   Single Arm Raises Limitations attempted but unable    Straight Leg Raises Limitations attempted but unable    Knee/Hip Exercises: Stretches   Passive Hamstring Stretch 3 reps;30 seconds   Passive Hamstring Stretch Limitations supine    Gastroc Stretch 3 reps;20 seconds   Gastroc Stretch Limitations supine    Knee/Hip Exercises: Standing   Other Standing Knee Exercises --   Knee/Hip Exercises: Seated   Other Seated Knee/Hip Exercises seated thoracic and cervical excursions 1x10   Knee/Hip Exercises: Supine   Bridges Limitations 1x10   Other Supine Knee/Hip Exercises --             Balance Exercises - 10/30/15 1647    Balance Exercises: Standing   Standing Eyes Opened Narrow base of support (BOS);Solid surface;2 reps;10 secs   Tandem Stance Eyes open;2 reps;10 secs           PT Education - 10/30/15 1659    Education provided No          PT Short Term Goals - 10/21/15 1214    PT SHORT TERM GOAL #1   Title Pt will be independent with HEP.    Time 3   Period Weeks   Status New   PT SHORT TERM GOAL #2   Title Improve BLE strength as evidenced by five time sit to stand time of 35 seconds or less with no UE support.    Time 3   Period Weeks   Status New   PT SHORT TERM GOAL #3   Title Improve functional activity tolerance evidenced by pt ambulating 275 feet or more on 3 minute walk test.    Time 3   Period Weeks   Status New   PT SHORT TERM GOAL #4   Title Decrease fall risk per TUG time of 25 seconds or less.    Time 3   Period Weeks   Status New           PT Long Term Goals -  10/21/15 1215    PT LONG TERM GOAL #1   Title Pt will be independent with advanced HEP for functional strengthening, improving posture, and improving balance.  Time 6   Period Weeks   Status New   PT LONG TERM GOAL #2   Title Improve BLE strength evidenced by five time sit to stand time of 20 seconds or less with no UE support.    Time 6   Period Weeks   Status New   PT LONG TERM GOAL #3   Title Improve functional activity tolerance evidenced by pt ambulating 450' during 3 minute walk test.    Time 6   Period Weeks   Status New   PT LONG TERM GOAL #4   Title Decrease fall risk per TUG time of 18 seconds or less.    Time 6   Period Weeks   Status New   PT LONG TERM GOAL #5   Title Pt will report that she has completed grocery shopping with caregiver, ambulating through grocery store with rollator and <3 rest breaks.    Time 6   Period Weeks   Status New               Plan - 10/30/15 1700    Clinical Impression Statement Focused on postural exercises in form of spine mobilty exercises today as well as functional exercises and activities to improve overall mobility. Patient very motivated to try quadruped today, and was able to mobilize into the classic quadruped position with min guard from PT, however unable to come up to knee or raise an arm  or leg today due to some shoulder tenderness and general weakness. Patient fairly motivated to start progressing twoards floor to stands in the event that she does have a fall. Introduced balance activities today at end of session and it is clear that patient will benefit from a more extensive approach to these activities as well.  Did not place a large amount of focus on standing or on activities involing knee musculature today due to extent of pain in knees today.    Pt will benefit from skilled therapeutic intervention in order to improve on the following deficits Decreased activity tolerance;Decreased balance;Abnormal gait;Decreased  endurance;Decreased mobility;Decreased strength;Difficulty walking;Postural dysfunction   Rehab Potential Good   PT Frequency 2x / week   PT Duration 6 weeks   PT Treatment/Interventions ADLs/Self Care Home Management;Moist Heat;Gait training;Stair training;Functional mobility training;Therapeutic activities;Therapeutic exercise;Balance training;Neuromuscular re-education;Patient/family education;Manual techniques   PT Next Visit Plan continue balance and postural strengthening; continue quadruped and progress to floor to stands when patient is functionally ready    Consulted and Agree with Plan of Care Patient        Problem List Patient Active Problem List   Diagnosis Date Noted  . Unsteady gait 10/15/2015  . Hearing loss in left ear 10/15/2015  . Osteoarthritis of both knees 08/27/2015  . Tremor 08/27/2015  . Depression 03/25/2015  . Anemia 01/31/2015  . Femoral neck fracture ([place]) 01/31/2015  . Faintness   . Fall at home 04/04/2014  . Multiple thyroid nodules 04/04/2014  . GERD (gastroesophageal reflux disease) 04/04/2014  . Postmenopausal atrophic vaginitis 03/12/2014  . Vitamin D deficiency 03/12/2014  . Osteopetrosis 10/23/2013  . Urinary incontinence, urge 10/23/2013  . Thoracic back pain 10/23/2013  . Lumbar back pain 10/23/2013  . Colon polyps 02/27/2012  . CAD, NATIVE VESSEL 12/12/2009  . Bilateral carotid bruits 12/12/2009  . ALLERGIC URTICARIA 09/09/2009  . Osteoarthrosis, unspecified whether generalized or localized, involving lower leg 04/19/2009  . Essential hypertension 10/16/2008  . FATIGUE 10/16/2008  . HEMORRHOIDS 03/29/2008  . INTERMITTENT VERTIGO 03/29/2008  Kerry Flynn PT, DPT [id number]  [place] [address] [place], [place], [address] Phone: [phone]   Fax:  [phone]  Name: Kerry Flynn MRN: [id number] Date of Birth: July 04, 1927

## 2015-11-03 ENCOUNTER — Other Ambulatory Visit: Payer: Self-pay

## 2015-11-03 DIAGNOSIS — R251 Tremor, unspecified: Secondary | ICD-10-CM

## 2015-11-03 NOTE — Telephone Encounter (Signed)
Referred.

## 2015-11-04 ENCOUNTER — Encounter (HOSPITAL_COMMUNITY): Payer: Medicare Other

## 2015-11-06 ENCOUNTER — Ambulatory Visit (HOSPITAL_COMMUNITY): Payer: Medicare Other

## 2015-11-06 DIAGNOSIS — R6889 Other general symptoms and signs: Secondary | ICD-10-CM

## 2015-11-06 DIAGNOSIS — R262 Difficulty in walking, not elsewhere classified: Secondary | ICD-10-CM

## 2015-11-06 DIAGNOSIS — R29898 Other symptoms and signs involving the musculoskeletal system: Secondary | ICD-10-CM

## 2015-11-06 DIAGNOSIS — R2681 Unsteadiness on feet: Secondary | ICD-10-CM | POA: Diagnosis not present

## 2015-11-06 DIAGNOSIS — Z9181 History of falling: Secondary | ICD-10-CM

## 2015-11-06 DIAGNOSIS — M4004 Postural kyphosis, thoracic region: Secondary | ICD-10-CM

## 2015-11-06 NOTE — Therapy (Signed)
[facility] [address] [place], [place], [address] Phone: [phone]   Fax:  [phone]  Physical Therapy Treatment  Patient Details  Name: Kerry Flynn MRN: [id number] Date of Birth: [birth year] Referring Provider: Moshe Cipro  Encounter Date: 11/06/2015      PT End of Session - 11/06/15 1206    Visit Number 5   Number of Visits 12   Date for PT Re-Evaluation 11/21/15   Authorization Type [employer] Medicare   Authorization Time Period 10/21/15-12/20/15   Authorization - Visit Number 5   Authorization - Number of Visits 10   PT Start Time 1106   PT Stop Time 1148   PT Time Calculation (min) 42 min   Equipment Utilized During Treatment Gait belt   Activity Tolerance Patient tolerated treatment well   Behavior During Therapy [facility] for tasks assessed/performed      Past Medical History  Diagnosis Date  . Hemorrhoids   . Intermittent vertigo   . Osteoporosis     knees  . Benign recurrent vertigo   . Vitamin D deficiency   . Head trauma     status post fall; upper and lower extremities   . GERD (gastroesophageal reflux disease)   . Diverticulosis   . Chronic constipation   . Dermatitis     recurrent  . Hypertension   . Barrett's esophagus     Dr. Laural Golden, Dr. Moshe Cipro  . Infertility, female   . Peripheral edema     chronic  . Chronic knee pain   . Fatigue     chronic  . Anemia   . Hip fracture, left ([place])     s/p left arthroplasty 2/16  . Osteoarthritis     of the neck  . DDD (degenerative disc disease), lumbar   . DDD (degenerative disc disease), cervical   . Varicose veins     Past Surgical History  Procedure Laterality Date  . Knee arthroscopy Right 1999  . Knee arthroscopy Left 1996  . Cholecystectomy, laparoscopic  06/17/2006    with stones  . Rotator cuff repair  05/14/2010  . Cataract extraction w/ intraocular lens implant Left 01/06/2012    left, [facility]  . Eye surgery  01/24/2012    bilateral cataract  extraction  . Colonoscopy  04/19/2012    Procedure: COLONOSCOPY;  Surgeon: Rogene Houston, MD;  Location: [facility];  Service: Endoscopy;  Laterality: N/A;  1200  . Tonsillectomy and adenoidectomy  1934  . Palate surgery  1988  . Dental implant   09/2014  . Hip arthroplasty Left 01/31/2015    Procedure: ARTHROPLASTY BIPOLAR HIP;  Surgeon: Carole Civil, MD;  Location: [facility] OR[facility];  Service: Orthopedics;  Laterality: Left;  . Colonoscopy w/ polypectomy  05/29/1999, 01/12/2012  . Esophagogastroduodenoscopy  01/12/2012  . Upper gi endoscopy  12/12/2006    with biopsy, with colonoscopy - dx: Barrett's disease  . Transthoracic echocardiogram  01/2012    EF=>55%; mild mitral annular calcification & mild MR; trace TR; trace pulm valve regurg  . Nm myocar perf wall motion  02/2012    lexiscan - small, fized apical lateral bowel attenuation artifact; no reversible ischemia; EF 77%; non-diagnostic for ischemia; low risk  . Cardiac catheterization  09/02/2005    mild-mod calcification in mid LAD (20-30% luminal obstruction), otherwise normal coronaries, patent LE arteries (Dr. Jackie Plum)  . Carotid doppler  02/2008    R & L ICAs 0-49% diameter reduction  . Cholecystectomy    .  Tonsillectomy    . Colonoscopy N/A 09/25/2015    Procedure: COLONOSCOPY;  Surgeon: Rogene Houston, MD;  Location[facility];  Service: Endoscopy;  Laterality: N/A;  100    There were no vitals filed for this visit.  Visit Diagnosis:  Unsteady gait  Difficulty walking  Decreased functional activity tolerance  Weakness of both legs  Postural kyphosis of thoracic region  Risk for falls      Subjective Assessment - 11/06/15 1111    Subjective Pt says she has been trying to walk more, but notes that is causeing some 4/10 soreness on her hip.    Pertinent History Partial hip replacement 01/31/15 on LLE.    Patient Stated Goals Be able to walk long enough to go grocery shopping                          [facility] Adult PT Treatment/Exercise - 11/06/15 0001    Neck Exercises: Standing   Neck Retraction 15 reps;3 secs  semirecumbent on wedge into 2 pillows   Other Standing Exercises recubent on wedge, reverse push up retraction with 5# bar   Other Standing Exercises dislocates stretch 1x30sec  semi recumbent; not tolerated    Neck Exercises: Seated   Cervical Rotation Both;15 reps  semirecumbent   W Back 15 reps  supine on wedge   Other Seated Exercise T's supine on wedge  1x15   Lumbar Exercises: Stretches   Active Hamstring Stretch Limitations gastroc/soleus stretch   2x30sec on slant board   Passive Hamstring Stretch 2 reps;30 seconds  bilat, on 8" step   Lumbar Exercises: Seated   Other Seated Lumbar Exercises seated trunk rotation 1x15 bilat   Manual Therapy   Joint Mobilization scapulothoracic retraction/depression  2x30 seconds bilat   Myofascial Release anterior deltoid 5 minutes  TrP falre after stretches                PT Education - 11/06/15 1204    Education provided Yes   Education Details Pt concerned about pain in biceps tendon; explained it was likely anterior deltoid and asked to put some heat on it at home.    Person(s) Educated Patient   Methods Explanation;Demonstration   Comprehension Verbalized understanding          PT Short Term Goals - 10/21/15 1214    PT SHORT TERM GOAL #1   Title Pt will be independent with HEP.    Time 3   Period Weeks   Status New   PT SHORT TERM GOAL #2   Title Improve BLE strength as evidenced by five time sit to stand time of 35 seconds or less with no UE support.    Time 3   Period Weeks   Status New   PT SHORT TERM GOAL #3   Title Improve functional activity tolerance evidenced by pt ambulating 275 feet or more on 3 minute walk test.    Time 3   Period Weeks   Status New   PT SHORT TERM GOAL #4   Title Decrease fall risk per TUG time of 25 seconds or less.    Time 3   Period Weeks   Status New            PT Long Term Goals - 10/21/15 1215    PT LONG TERM GOAL #1   Title Pt will be independent with advanced HEP for functional strengthening, improving posture, and improving balance.  Time 6   Period Weeks   Status New   PT LONG TERM GOAL #2   Title Improve BLE strength evidenced by five time sit to stand time of 20 seconds or less with no UE support.    Time 6   Period Weeks   Status New   PT LONG TERM GOAL #3   Title Improve functional activity tolerance evidenced by pt ambulating 450' during 3 minute walk test.    Time 6   Period Weeks   Status New   PT LONG TERM GOAL #4   Title Decrease fall risk per TUG time of 18 seconds or less.    Time 6   Period Weeks   Status New   PT LONG TERM GOAL #5   Title Pt will report that she has completed grocery shopping with caregiver, ambulating through grocery store with rollator and <3 rest breaks.    Time 6   Period Weeks   Status New               Plan - 11/06/15 1207    Clinical Impression Statement Session targeted postural stretching and periscapular strengthening. Pt tolerates stretching well, with significant improved posture in sitting after session ended. scapulae remain fairly tight, hence manual stretchign was added to facilitat enadavoid aggravation of R shoulder joint. Pt experienced acute onset of anterior deltoid aggravation, which resolved somewhat with MFR. Pt demonstrating good awareness of body in space.     Pt will benefit from skilled therapeutic intervention in order to improve on the following deficits Decreased activity tolerance;Decreased balance;Abnormal gait;Decreased endurance;Decreased mobility;Decreased strength;Difficulty walking;Postural dysfunction   Rehab Potential Good   PT Frequency 2x / week   PT Duration 6 weeks   PT Treatment/Interventions ADLs/Self Care Home Management;Moist Heat;Gait training;Stair training;Functional mobility training;Therapeutic activities;Therapeutic  exercise;Balance training;Neuromuscular re-education;Patient/family education;Manual techniques   PT Next Visit Plan continue with supine back stretching and cervical retaction and other previous activity including quadruped and progress to floor to stands when patient is functionally ready    PT Home Exercise Plan no changes    Consulted and Agree with Plan of Care Patient        Problem List Patient Active Problem List   Diagnosis Date Noted  . Unsteady gait 10/15/2015  . Hearing loss in left ear 10/15/2015  . Osteoarthritis of both knees 08/27/2015  . Tremor 08/27/2015  . Depression 03/25/2015  . Anemia 01/31/2015  . Femoral neck fracture ([place]) 01/31/2015  . Faintness   . Fall at home 04/04/2014  . Multiple thyroid nodules 04/04/2014  . GERD (gastroesophageal reflux disease) 04/04/2014  . Postmenopausal atrophic vaginitis 03/12/2014  . Vitamin D deficiency 03/12/2014  . Osteopetrosis 10/23/2013  . Urinary incontinence, urge 10/23/2013  . Thoracic back pain 10/23/2013  . Lumbar back pain 10/23/2013  . Colon polyps 02/27/2012  . CAD, NATIVE VESSEL 12/12/2009  . Bilateral carotid bruits 12/12/2009  . ALLERGIC URTICARIA 09/09/2009  . Osteoarthrosis, unspecified whether generalized or localized, involving lower leg 04/19/2009  . Essential hypertension 10/16/2008  . FATIGUE 10/16/2008  . HEMORRHOIDS 03/29/2008  . INTERMITTENT VERTIGO 03/29/2008    Sem Mccaughey C 11/06/2015, 12:14 PM  12:15 PM  Etta Grandchild, PT, DPT [place] License # [id number]       [facility] [address] [place], [place], [address] Phone: [phone]   Fax:  [phone]  Name: TAMYIA CURATOLA MRN: [id number] Date of Birth: 05-30-1927

## 2015-11-11 ENCOUNTER — Ambulatory Visit (HOSPITAL_COMMUNITY): Payer: Medicare Other | Admitting: Physical Therapy

## 2015-11-11 DIAGNOSIS — R262 Difficulty in walking, not elsewhere classified: Secondary | ICD-10-CM

## 2015-11-11 DIAGNOSIS — R2681 Unsteadiness on feet: Secondary | ICD-10-CM

## 2015-11-11 DIAGNOSIS — R29898 Other symptoms and signs involving the musculoskeletal system: Secondary | ICD-10-CM

## 2015-11-11 DIAGNOSIS — R6889 Other general symptoms and signs: Secondary | ICD-10-CM

## 2015-11-11 NOTE — Therapy (Signed)
[facility] [address] [place], [place], [address] Phone: [phone]   Fax:  [phone]  Physical Therapy Treatment  Patient Details  Name: Kerry Flynn MRN: [id number] Date of Birth: 1927-07-11 Referring Provider: Moshe Cipro  Encounter Date: 11/11/2015      PT End of Session - 11/11/15 1206    Visit Number 6   Number of Visits 12   Date for PT Re-Evaluation 11/21/15   Authorization Type [employer] Medicare   Authorization Time Period 10/21/15-12/20/15   Authorization - Visit Number 6   Authorization - Number of Visits 10   PT Start Time 1107   PT Stop Time 1148   PT Time Calculation (min) 41 min   Activity Tolerance Patient tolerated treatment well   Behavior During Therapy [facility] for tasks assessed/performed      Past Medical History  Diagnosis Date  . Hemorrhoids   . Intermittent vertigo   . Osteoporosis     knees  . Benign recurrent vertigo   . Vitamin D deficiency   . Head trauma     status post fall; upper and lower extremities   . GERD (gastroesophageal reflux disease)   . Diverticulosis   . Chronic constipation   . Dermatitis     recurrent  . Hypertension   . Barrett's esophagus     Dr. Laural Golden, Dr. Moshe Cipro  . Infertility, female   . Peripheral edema     chronic  . Chronic knee pain   . Fatigue     chronic  . Anemia   . Hip fracture, left ([place])     s/p left arthroplasty 2/16  . Osteoarthritis     of the neck  . DDD (degenerative disc disease), lumbar   . DDD (degenerative disc disease), cervical   . Varicose veins     Past Surgical History  Procedure Laterality Date  . Knee arthroscopy Right 1999  . Knee arthroscopy Left 1996  . Cholecystectomy, laparoscopic  06/17/2006    with stones  . Rotator cuff repair  05/14/2010  . Cataract extraction w/ intraocular lens implant Left 01/06/2012    left, [facility]  . Eye surgery  01/24/2012    bilateral cataract extraction  . Colonoscopy  04/19/2012     Procedure: COLONOSCOPY;  Surgeon: Rogene Houston, MD;  [facility];  Service: Endoscopy;  Laterality: N/A;  1200  . Tonsillectomy and adenoidectomy  1934  . Palate surgery  1988  . Dental implant   09/2014  . Hip arthroplasty Left 01/31/2015    Procedure: ARTHROPLASTY BIPOLAR HIP;  Surgeon: Carole Civil, MD;  Location: [facility];  Service: Orthopedics;  Laterality: Left;  . Colonoscopy w/ polypectomy  05/29/1999, 01/12/2012  . Esophagogastroduodenoscopy  01/12/2012  . Upper gi endoscopy  12/12/2006    with biopsy, with colonoscopy - dx: Barrett's disease  . Transthoracic echocardiogram  01/2012    EF=>55%; mild mitral annular calcification & mild MR; trace TR; trace pulm valve regurg  . Nm myocar perf wall motion  02/2012    lexiscan - small, fized apical lateral bowel attenuation artifact; no reversible ischemia; EF 77%; non-diagnostic for ischemia; low risk  . Cardiac catheterization  09/02/2005    mild-mod calcification in mid LAD (20-30% luminal obstruction), otherwise normal coronaries, patent LE arteries (Dr. Jackie Plum)  . Carotid doppler  02/2008    R & L ICAs 0-49% diameter reduction  . Cholecystectomy    . Tonsillectomy    . Colonoscopy  N/A 09/25/2015    Procedure: COLONOSCOPY;  Surgeon: Rogene Houston, MD;  [facility];  Service: Endoscopy;  Laterality: N/A;  100    There were no vitals filed for this visit.  Visit Diagnosis:  Unsteady gait  Difficulty walking  Decreased functional activity tolerance  Weakness of both legs      Subjective Assessment - 11/11/15 1109    Subjective Pt reports that she feels sore all over today.    Currently in Pain? Yes   Pain Score 6    Pain Location Knee   Pain Orientation Right;Left                         [facility] Adult PT Treatment/Exercise - 11/11/15 0001    Neck Exercises: Supine   Shoulder ABduction 15 reps   Shoulder Abduction Limitations supine T's with red tband   Upper Extremity D2 10  reps;Theraband   Theraband Level (UE D2) Level 2 (Red)   Other Supine Exercise supine stargazer stretch 2x30"   Lumbar Exercises: Supine   Clam 15 reps   Clam Limitations with GTB   Bridge 15 reps   Bridge Limitations on wedge   Knee/Hip Exercises: Seated   Long Arc Quad Strengthening;Both;15 reps   Long Arc Quad Weight 3 lbs.   Knee/Hip Exercises: Supine   Short Arc Quad Sets 1 set;10 reps   Manual Therapy   Joint Mobilization scapulothoracic retraction/depression  performed following supine postural strengthening   Myofascial Release to R teres minor, infraspinatus  performed following supine postural strengthening                  PT Short Term Goals - 10/21/15 1214    PT SHORT TERM GOAL #1   Title Pt will be independent with HEP.    Time 3   Period Weeks   Status New   PT SHORT TERM GOAL #2   Title Improve BLE strength as evidenced by five time sit to stand time of 35 seconds or less with no UE support.    Time 3   Period Weeks   Status New   PT SHORT TERM GOAL #3   Title Improve functional activity tolerance evidenced by pt ambulating 275 feet or more on 3 minute walk test.    Time 3   Period Weeks   Status New   PT SHORT TERM GOAL #4   Title Decrease fall risk per TUG time of 25 seconds or less.    Time 3   Period Weeks   Status New           PT Long Term Goals - 10/21/15 1215    PT LONG TERM GOAL #1   Title Pt will be independent with advanced HEP for functional strengthening, improving posture, and improving balance.    Time 6   Period Weeks   Status New   PT LONG TERM GOAL #2   Title Improve BLE strength evidenced by five time sit to stand time of 20 seconds or less with no UE support.    Time 6   Period Weeks   Status New   PT LONG TERM GOAL #3   Title Improve functional activity tolerance evidenced by pt ambulating 450' during 3 minute walk test.    Time 6   Period Weeks   Status New   PT LONG TERM GOAL #4   Title Decrease fall  risk per TUG time of 18  seconds or less.    Time 6   Period Weeks   Status New   PT LONG TERM GOAL #5   Title Pt will report that she has completed grocery shopping with caregiver, ambulating through grocery store with rollator and <3 rest breaks.    Time 6   Period Weeks   Status New               Plan - 11/11/15 1206    Clinical Impression Statement Continued with postural and BLE strengthening in today's treatment. Pt required verbal and visual cueing for supine scapular retraction and D2 with red tband in order to ensure proper form. Manual therapy was completed in prone following supine strengthening in order to decrease pain and improve scapulothoracic motion. Pt was unable to complete bridge without knee pain, wedge was added under knees with resolution of symptoms.    PT Next Visit Plan Progress to quadruped activities when tolerated, continue with postural and BLE strengthening        Problem List Patient Active Problem List   Diagnosis Date Noted  . Unsteady gait 10/15/2015  . Hearing loss in left ear 10/15/2015  . Osteoarthritis of both knees 08/27/2015  . Tremor 08/27/2015  . Depression 03/25/2015  . Anemia 01/31/2015  . Femoral neck fracture ([place]) 01/31/2015  . Faintness   . Fall at home 04/04/2014  . Multiple thyroid nodules 04/04/2014  . GERD (gastroesophageal reflux disease) 04/04/2014  . Postmenopausal atrophic vaginitis 03/12/2014  . Vitamin D deficiency 03/12/2014  . Osteopetrosis 10/23/2013  . Urinary incontinence, urge 10/23/2013  . Thoracic back pain 10/23/2013  . Lumbar back pain 10/23/2013  . Colon polyps 02/27/2012  . CAD, NATIVE VESSEL 12/12/2009  . Bilateral carotid bruits 12/12/2009  . ALLERGIC URTICARIA 09/09/2009  . Osteoarthrosis, unspecified whether generalized or localized, involving lower leg 04/19/2009  . Essential hypertension 10/16/2008  . FATIGUE 10/16/2008  . HEMORRHOIDS 03/29/2008  . INTERMITTENT VERTIGO 03/29/2008     Hilma Favors, PT, DPT [phone] 11/11/2015, 12:09 PM  [facility] [place], [place], [address] Phone: [phone]   Fax:  [phone]  Name: ILIAN RILING MRN: [id number] Date of Birth: [birth year]

## 2015-11-13 ENCOUNTER — Ambulatory Visit (HOSPITAL_COMMUNITY): Payer: Medicare Other | Admitting: Physical Therapy

## 2015-11-13 DIAGNOSIS — R29898 Other symptoms and signs involving the musculoskeletal system: Secondary | ICD-10-CM

## 2015-11-13 DIAGNOSIS — R6889 Other general symptoms and signs: Secondary | ICD-10-CM

## 2015-11-13 DIAGNOSIS — M4004 Postural kyphosis, thoracic region: Secondary | ICD-10-CM

## 2015-11-13 DIAGNOSIS — R2681 Unsteadiness on feet: Secondary | ICD-10-CM

## 2015-11-13 DIAGNOSIS — R262 Difficulty in walking, not elsewhere classified: Secondary | ICD-10-CM

## 2015-11-13 NOTE — Therapy (Signed)
[place], [place], [address] Phone: [phone]   Fax:  [phone]  Physical Therapy Treatment  Patient Details  Name: Kerry Flynn MRN: [id number] Date of Birth: June 13, 1927 Referring Provider: Moshe Cipro  Encounter Date: 11/13/2015      PT End of Session - 11/13/15 1151    Visit Number 7   Number of Visits 12   Date for PT Re-Evaluation 11/21/15   Authorization Type [employer]   Authorization Time Period 10/21/15-12/20/15   Authorization - Visit Number 7   Authorization - Number of Visits 10   PT Start Time [id number]   PT Stop Time 1146   PT Time Calculation (min) 43 min   Activity Tolerance Patient tolerated treatment well   Behavior During Therapy [facility] for tasks assessed/performed      Past Medical History  Diagnosis Date  . Hemorrhoids   . Intermittent vertigo   . Osteoporosis     knees  . Benign recurrent vertigo   . Vitamin D deficiency   . Head trauma     status post fall; upper and lower extremities   . GERD (gastroesophageal reflux disease)   . Diverticulosis   . Chronic constipation   . Dermatitis     recurrent  . Hypertension   . Barrett's esophagus     Dr. Laural Golden, Dr. Moshe Cipro  . Infertility, female   . Peripheral edema     chronic  . Chronic knee pain   . Fatigue     chronic  . Anemia   . Hip fracture, left ([place])     s/p left arthroplasty 2/16  . Osteoarthritis     of the neck  . DDD (degenerative disc disease), lumbar   . DDD (degenerative disc disease), cervical   . Varicose veins     Past Surgical History  Procedure Laterality Date  . Knee arthroscopy Right 1999  . Knee arthroscopy Left 1996  . Cholecystectomy, laparoscopic  06/17/2006    with stones  . Rotator cuff repair  05/14/2010  . Cataract extraction w/ intraocular lens implant Left 01/06/2012    left, [facility]  . Eye surgery  01/24/2012    bilateral cataract extraction  . Colonoscopy  04/19/2012   Procedure: COLONOSCOPY;  Surgeon: Rogene Houston, MD;  Location: [facility];  Service: Endoscopy;  Laterality: N/A;  1200  . Tonsillectomy and adenoidectomy  1934  . Palate surgery  1988  . Dental implant   09/2014  . Hip arthroplasty Left 01/31/2015    Procedure: ARTHROPLASTY BIPOLAR HIP;  Surgeon: Carole Civil, MD;  Location: [facility];  Service: Orthopedics;  Laterality: Left;  . Colonoscopy w/ polypectomy  05/29/1999, 01/12/2012  . Esophagogastroduodenoscopy  01/12/2012  . Upper gi endoscopy  12/12/2006    with biopsy, with colonoscopy - dx: Barrett's disease  . Transthoracic echocardiogram  01/2012    EF=>55%; mild mitral annular calcification & mild MR; trace TR; trace pulm valve regurg  . Nm myocar perf wall motion  02/2012    lexiscan - small, fized apical lateral bowel attenuation artifact; no reversible ischemia; EF 77%; non-diagnostic for ischemia; low risk  . Cardiac catheterization  09/02/2005    mild-mod calcification in mid LAD (20-30% luminal obstruction), otherwise normal coronaries, patent LE arteries (Dr. Jackie Plum)  . Carotid doppler  02/2008    R & L ICAs 0-49% diameter reduction  . Cholecystectomy    . Tonsillectomy    . Colonoscopy N/A 09/25/2015  Procedure: COLONOSCOPY;  Surgeon: Rogene Houston, MD;  Location: [facility];  Service: Endoscopy;  Laterality: N/A;  100    There were no vitals filed for this visit.  Visit Diagnosis:  Unsteady gait  Difficulty walking  Decreased functional activity tolerance  Weakness of both legs  Postural kyphosis of thoracic region      Subjective Assessment - 11/13/15 1107    Subjective Pt reports that she is having a lot of knee pain today in both knees.    Currently in Pain? Yes   Pain Score 5    Pain Location Knee   Pain Orientation Right;Left                         [facility] Adult PT Treatment/Exercise - 11/13/15 0001    Neck Exercises: Supine   Shoulder ABduction 15 reps   Shoulder Abduction  Limitations suipne T's with red tband   Upper Extremity D2 10 reps;Theraband   Theraband Level (UE D2) Level 2 (Red)   Lumbar Exercises: Standing   Scapular Retraction 10 reps;Theraband   Theraband Level (Scapular Retraction) Level 3 (Green)   Row 10 reps;Theraband   Theraband Level (Row) Level 3 (Green)   Shoulder Extension 10 reps;Theraband   Theraband Level (Shoulder Extension) Level 3 (Green)   Lumbar Exercises: Seated   Other Seated Lumbar Exercises seated cone rotations and forward cone reaches with tactile facilitation    Lumbar Exercises: Supine   Clam 15 reps   Clam Limitations with GTB   Bridge 15 reps   Bridge Limitations on wedge             Balance Exercises - 11/13/15 1150    Balance Exercises: Standing   Standing Eyes Opened Narrow base of support (BOS);2 reps;30 secs   Tandem Stance 2 reps;15 secs  semi-tandem   Standing, One Foot on a Step 4 reps;15 secs;4 inch   Step Ups 4 inch  tap ups             PT Short Term Goals - 10/21/15 1214    PT SHORT TERM GOAL #1   Title Pt will be independent with HEP.    Time 3   Period Weeks   Status New   PT SHORT TERM GOAL #2   Title Improve BLE strength as evidenced by five time sit to stand time of 35 seconds or less with no UE support.    Time 3   Period Weeks   Status New   PT SHORT TERM GOAL #3   Title Improve functional activity tolerance evidenced by pt ambulating 275 feet or more on 3 minute walk test.    Time 3   Period Weeks   Status New   PT SHORT TERM GOAL #4   Title Decrease fall risk per TUG time of 25 seconds or less.    Time 3   Period Weeks   Status New           PT Long Term Goals - 10/21/15 1215    PT LONG TERM GOAL #1   Title Pt will be independent with advanced HEP for functional strengthening, improving posture, and improving balance.    Time 6   Period Weeks   Status New   PT LONG TERM GOAL #2   Title Improve BLE strength evidenced by five time sit to stand time of 20  seconds or less with no UE support.    Time 6  Period Weeks   Status New   PT LONG TERM GOAL #3   Title Improve functional activity tolerance evidenced by pt ambulating 450' during 3 minute walk test.    Time 6   Period Weeks   Status New   PT LONG TERM GOAL #4   Title Decrease fall risk per TUG time of 18 seconds or less.    Time 6   Period Weeks   Status New   PT LONG TERM GOAL #5   Title Pt will report that she has completed grocery shopping with caregiver, ambulating through grocery store with rollator and <3 rest breaks.    Time 6   Period Weeks   Status New               Plan - 11/13/15 1151    Clinical Impression Statement Continued with postural strengthening in today's treatment, with progression of tband strengthening using green tband. Pt required verbal cueing throughout treatment to maintain upright posture in sitting and in standing, especially during balance activities. Balance exercises were added today to decrease fall risk, pt had difficulty with completing tap ups at 4" step without UE support.    PT Next Visit Plan Continue balance and postural strengthening        Problem List Patient Active Problem List   Diagnosis Date Noted  . Unsteady gait 10/15/2015  . Hearing loss in left ear 10/15/2015  . Osteoarthritis of both knees 08/27/2015  . Tremor 08/27/2015  . Depression 03/25/2015  . Anemia 01/31/2015  . Femoral neck fracture ([place]) 01/31/2015  . Faintness   . Fall at home 04/04/2014  . Multiple thyroid nodules 04/04/2014  . GERD (gastroesophageal reflux disease) 04/04/2014  . Postmenopausal atrophic vaginitis 03/12/2014  . Vitamin D deficiency 03/12/2014  . Osteopetrosis 10/23/2013  . Urinary incontinence, urge 10/23/2013  . Thoracic back pain 10/23/2013  . Lumbar back pain 10/23/2013  . Colon polyps 02/27/2012  . CAD, NATIVE VESSEL 12/12/2009  . Bilateral carotid bruits 12/12/2009  . ALLERGIC URTICARIA 09/09/2009  . Osteoarthrosis,  unspecified whether generalized or localized, involving lower leg 04/19/2009  . Essential hypertension 10/16/2008  . FATIGUE 10/16/2008  . HEMORRHOIDS 03/29/2008  . INTERMITTENT VERTIGO 03/29/2008    Hilma Favors, PT, DPT [phone] 11/13/2015, 11:58 AM  [place], [place], [address] Phone: [phone]   Fax:  [phone]  Name: Kerry Flynn MRN: [id number] Date of Birth: May 01, 1927

## 2015-11-17 ENCOUNTER — Encounter: Payer: Self-pay | Admitting: Neurology

## 2015-11-17 ENCOUNTER — Ambulatory Visit (INDEPENDENT_AMBULATORY_CARE_PROVIDER_SITE_OTHER): Payer: Medicare Other | Admitting: Neurology

## 2015-11-17 VITALS — BP 160/68 | HR 77 | Ht 66.0 in | Wt 140.5 lb

## 2015-11-17 DIAGNOSIS — G20A1 Parkinson's disease without dyskinesia, without mention of fluctuations: Secondary | ICD-10-CM | POA: Insufficient documentation

## 2015-11-17 DIAGNOSIS — R269 Unspecified abnormalities of gait and mobility: Secondary | ICD-10-CM | POA: Diagnosis not present

## 2015-11-17 DIAGNOSIS — G20 Parkinson's disease: Secondary | ICD-10-CM

## 2015-11-17 DIAGNOSIS — R29898 Other symptoms and signs involving the musculoskeletal system: Secondary | ICD-10-CM

## 2015-11-17 HISTORY — DX: Other symptoms and signs involving the musculoskeletal system: R29.898

## 2015-11-17 HISTORY — DX: Parkinson's disease without dyskinesia, without mention of fluctuations: G20.A1

## 2015-11-17 HISTORY — DX: Unspecified abnormalities of gait and mobility: R26.9

## 2015-11-17 HISTORY — DX: Parkinson's disease: G20

## 2015-11-17 MED ORDER — CARBIDOPA-LEVODOPA 25-100 MG PO TABS: 0.5000 | ORAL_TABLET | Freq: Three times a day (TID) | ORAL | Status: DC

## 2015-11-17 NOTE — Patient Instructions (Addendum)
   We will start Sinemet 25/100 mg tablet, watch out for drowsiness, confusion, dizziness and nausea. Start 1/2 tablet twice a day for 2 weeks, then take 1/2 tablet three times a day.   Parkinson Disease Parkinson disease is a disorder of the central nervous system, which includes the brain and spinal cord. A person with this disease slowly loses the ability to completely control body movements. Within the brain, there is a group of nerve cells (basal ganglia) that help control movement. The basal ganglia are damaged and do not work properly in a person with Parkinson disease. In addition, the basal ganglia produce and use a brain chemical called dopamine. The dopamine chemical sends messages to other parts of the body to control and coordinate body movements. Dopamine levels are low in a person with Parkinson disease. If the dopamine levels are low, then the body does not receive the correct messages it needs to move normally.  CAUSES  The exact reason why the basal ganglia get damaged is not known. Some medical researchers have thought that infection, genes, environment, and certain medicines may contribute to the cause.  SYMPTOMS   An early symptom of Parkinson disease is often an uncontrolled shaking (tremor) of the hands. The tremor will often disappear when the affected hand is consciously used.  As the disease progresses, walking, talking, getting out of a chair, and new movements become more difficult.  Muscles get stiff and movements become slower.  Balance and coordination become harder.  Depression, trouble swallowing, urinary problems, constipation, and sleep problems can occur.  Later in the disease, memory and thought processes may deteriorate. DIAGNOSIS  There are no specific tests to diagnose Parkinson disease. You may be referred to a neurologist for evaluation. Your caregiver will ask about your medical history, symptoms, and perform a physical exam. Blood tests and imaging  tests of your brain may be performed to rule out other diseases. The imaging tests may include an MRI or a CT scan. TREATMENT  The goal of treatment is to relieve symptoms. Medicines may be prescribed once the symptoms become troublesome. Medicine will not stop the progression of the disease, but medicine can make movement and balance better and help control tremors. Speech and occupational therapy may also be prescribed. Sometimes, surgical treatment of the brain can be done in young people. HOME CARE INSTRUCTIONS  Get regular exercise and rest periods during the day to help prevent exhaustion and depression.  If getting dressed becomes difficult, replace buttons and zippers with Velcro and elastic on your clothing.  Take all medicine as directed by your caregiver.  Install grab bars or railings in your home to prevent falls.  Go to speech or occupational therapy as directed.  Keep all follow-up visits as directed by your caregiver. SEEK MEDICAL CARE IF:  Your symptoms are not controlled with your medicine.  You fall.  You have trouble swallowing or choke on your food. MAKE SURE YOU:  Understand these instructions.  Will watch your condition.  Will get help right away if you are not doing well or get worse.   This information is not intended to replace advice given to you by your health care provider. Make sure you discuss any questions you have with your health care provider.   Document Released: 12/10/2000 Document Revised: 04/09/2013 Document Reviewed: 01/12/2012 Elsevier Interactive Patient Education [employer].

## 2015-11-17 NOTE — Progress Notes (Signed)
Reason for visit: Parkinson's disease  Referring physician: Dr. Jerrilyn Cairo is a 81 y.o. female  History of present illness:  Ms. Moessner is an 81 year old right-handed white female with a history of a tremor that developed on the left hand about one year ago. The tremor has been primarily a resting tremor, and as time has gone on, the tremor has spread to the right side as well. 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 She has had increasing problems walking. She does have significant arthritis involving both knees, she has had a fall in February 2016 and fractured her left hip, requiring surgery. The patient uses a walker for ambulation. She has been using a walker for greater than a year, she was using a cane prior to that. The patient has also noted that she has had some troubles with drooling over the last 2 years. She lost her husband in March 2016, she lives alone, but she has a caretaker coming in several days a week to help her. She still operates a Teacher, [employer]. The patient is currently engaged in physical therapy. She has had some change in the voice is well, she has noted some problems with a raspy voice. She was seen by Dr. Lily Lovings, and he felt that she had Parkinson's disease, and she was started on Sinemet. She never got the prescription filled, she comes to this office for a second opinion.    Past Medical History  Diagnosis Date  . Hemorrhoids   . Intermittent vertigo   . Osteoporosis     knees  . Benign recurrent vertigo   . Vitamin D deficiency   . Head trauma     status post fall; upper and lower extremities   . GERD (gastroesophageal reflux disease)   . Diverticulosis   . Chronic constipation   . Dermatitis     recurrent  . Hypertension   . Barrett's esophagus     Dr. Laural Golden, Dr. Moshe Cipro  . Infertility, female   . Peripheral edema     chronic  . Chronic knee pain   . Fatigue     chronic  . Anemia   .  Hip fracture, left ([place])     s/p left arthroplasty 2/16  . Osteoarthritis     of the neck  . DDD (degenerative disc disease), lumbar   . DDD (degenerative disc disease), cervical   . Varicose veins   . Hemorrhoid   . Stable angina ([facility])   . Colon polyps   . Abnormality of gait 11/17/2015  . Parkinson disease ([place]) 11/17/2015  . 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 Knee arthroscopy Right 1999  . Knee arthroscopy Left 1996  . Cholecystectomy, laparoscopic  06/17/2006    with stones  . Rotator cuff repair  05/14/2010  . Cataract extraction w/ intraocular lens implant Left 01/06/2012    left, [facility]  . Eye surgery  01/24/2012    bilateral cataract extraction  . Colonoscopy  04/19/2012    Procedure: COLONOSCOPY;  Surgeon: Rogene Houston, MD;  [facility];  Service: Endoscopy;  Laterality: N/A;  1200  . Tonsillectomy and adenoidectomy  1934  . Palate surgery  1988  . Dental implant   09/2014  . Hip arthroplasty Left 01/31/2015    Procedure: ARTHROPLASTY BIPOLAR HIP;  Surgeon: Carole Civil, MD;  Location: [facility];  Service: Orthopedics;  Laterality: Left;  . Colonoscopy w/ polypectomy  05/29/1999, 01/12/2012  . Esophagogastroduodenoscopy  01/12/2012  . Upper gi endoscopy  12/12/2006    with biopsy, with colonoscopy - dx: Barrett's disease  . Transthoracic echocardiogram  01/2012    EF=>55%; mild mitral annular calcification & mild MR; trace TR; trace pulm valve regurg  . Nm myocar perf wall motion  02/2012    lexiscan - small, fized apical lateral bowel attenuation artifact; no reversible ischemia; EF 77%; non-diagnostic for ischemia; low risk  . Cardiac catheterization  09/02/2005    mild-mod calcification in mid LAD (20-30% luminal obstruction), otherwise normal coronaries, patent LE arteries (Dr. Jackie Plum)  . Carotid doppler  02/2008    R & L ICAs 0-49% diameter reduction  . Cholecystectomy    . Tonsillectomy    .  Colonoscopy N/A 09/25/2015    Procedure: COLONOSCOPY;  Surgeon: Rogene Houston, MD;  [facility];  Service: Endoscopy;  Laterality: N/A;  100    Family History  Problem Relation Age of Onset  . Parkinsonism Sister   . Diabetes Mother   . Hypertension Mother     cva  . CVA Mother   . Kidney disease Mother   . Heart failure Mother   . Colon cancer Neg Hx   . Cancer Father     larengeal  . Heart failure Father     CHF  . Heart disease Father   . Osteoporosis Sister   . Stroke Maternal Grandmother   . Stroke Maternal Grandfather     Social history:  reports that she has quit smoking. She has never used smokeless tobacco. She reports that she does not drink alcohol or use illicit drugs.  Medications:  Prior to Admission medications   Medication Sig Start Date End Date Taking? Authorizing Provider  acetaminophen (TYLENOL) 500 MG tablet Take 500 mg by mouth every 6 (six) hours as needed.   Yes Historical Provider, MD  amLODipine (NORVASC) 5 MG tablet TAKE 1 TABLET (5 MG TOTAL) BY MOUTH DAILY. 09/22/15  Yes Fayrene Helper, MD  Calcium-Vitamin D-Vitamin K 500-100-40 MG-UNT-MCG CHEW Chew 1 tablet by mouth 2 (two) times daily.    Yes Historical Provider, MD  docusate sodium (COLACE) 100 MG capsule Take 100 mg by mouth 3 (three) times daily as needed (constipation).    Yes Historical Provider, MD  Magnesium Oxide (PHILLIPS) 500 MG (LAX) TABS Take 1 tablet (500 mg total) by mouth daily as needed. 02/03/15  Yes Radene Gunning, NP  meclizine (ANTIVERT) 12.5 MG tablet Take 1 tablet (12.5 mg total) by mouth as needed. vertigo 07/26/12  Yes Fayrene Helper, MD  meloxicam (MOBIC) 15 MG tablet Take 1 tablet (15 mg total) by mouth daily. 10/15/15  Yes Fayrene Helper, MD  mirabegron ER (MYRBETRIQ) 50 MG TB24 tablet Take 1 tablet (50 mg total) by mouth daily. 05/05/15  Yes Fayrene Helper, MD  Misc Natural Products (COSAMIN ASU ADVANCED FORMULA) CAPS Take 1 capsule by mouth 3 (three)  times daily with meals.    Yes Historical Provider, MD  Multiple Vitamin (MULTIVITAMIN) tablet Take 1 tablet by mouth daily.     Yes Historical Provider, MD  nitroGLYCERIN (NITROSTAT) 0.4 MG SL tablet Place 0.4 mg under the tongue every 5 (five) minutes as needed. 1 tablet under the tongue at onset of chest pain: you may repeat every 5 minutes for up to 3 doses   Yes Historical Provider, MD  omeprazole (PRILOSEC) 40  MG capsule TAKE ONE CAPSULE BY MOUTH TWICE A DAY AS NEEDED 10/13/15  Yes Fayrene Helper, MD  Probiotic Product (HEALTHY COLON) CAPS Take 1 capsule by mouth daily.    Yes Historical Provider, MD  Wheat Dextrin (BENEFIBER) POWD Take 2 scoop by mouth 2 (two) times daily.    Yes Historical Provider, MD  Zoledronic Acid (RECLAST IV) Inject into the vein.   Yes Historical Provider, MD      Allergies  Allergen Reactions  . Clarithromycin   . Flagyl [Metronidazole]   . Penicillins     ROS:  Out of a complete 14 system review of symptoms, the patient complains only of the following symptoms, and all other reviewed systems are negative.  Palpitations of the heart, swelling in the legs Hearing loss Shortness of breath Incontinence, constipation Urinary incontinence Joint pain, joint swelling Memory loss, numbness, weakness, tremor Not enough sleep, decreased energy Restless legs  Blood pressure 160/68, pulse 77, height 5\' 6"  (1.676 m), weight 140 lb 8 oz (63.73 kg).  Physical Exam  General: The patient is alert and cooperative at the time of the examination.  Eyes: Pupils are equal, round, and reactive to light. Discs are flat bilaterally.  Neck: The neck is supple, no carotid bruits are noted.  Respiratory: The respiratory examination is clear.  Cardiovascular: The cardiovascular examination reveals a regular rate and rhythm, no obvious murmurs or rubs are noted.  Skin: Extremities are with 1+ edema at the ankles bilaterally.  Neurologic Exam  Mental status: The  patient is alert and oriented x 3 at the time of the examination. The patient has apparent normal recent and remote memory, with an apparently normal attention span and concentration ability.  Cranial nerves: Facial symmetry is present. There is good sensation of the face to pinprick and soft touch bilaterally. The strength of the facial muscles and the muscles to head turning and shoulder shrug are normal bilaterally. Speech is well enunciated, no aphasia or dysarthria is noted. Extraocular movements are full. Visual fields are full. The tongue is midline, and the patient has symmetric elevation of the soft palate. No obvious hearing deficits are noted. masking of the face is seen.  Motor: The motor testing reveals 5 over 5 strength of all 4 extremities. Good symmetric motor tone is noted throughout.  Sensory: Sensory testing is intact to pinprick, soft touch, vibration sensation, and position sense on all 4 extremities, with exception of some decrease in pinprick sensation around the right knee, decreased position sense on both feet . No evidence of extinction is noted.  Coordination: Cerebellar testing reveals good finger-nose-finger and heel-to-shin bilaterally. a resting tremor was noted on the left upper extremity.  Gait and station: The patient has some difficulty arising from a seated position. Once up, she is able to walk with a walker. The patient has a "dropped head". Tandem gait was not attempted. Romberg is negative. With sitting, the patient tends to lean backwards.  Reflexes: Deep tendon reflexes are symmetric, but are depressed bilaterally. Toes are downgoing bilaterally.   Assessment/Plan:   1. Parkinson's disease  2. Gait disorder  3. "Dropped head syndrome"  The patient appears to have features of parkinsonism. She has a true resting tremor with the left hand, a dropped head syndrome, drooling, and a gait disorder. The patient will be placed back on Sinemet. This will help  to confirm the diagnosis. She will start on the 25/100 mg tablets, taking one half tablet twice daily for 2 weeks,  then go to one half tablet 3 times daily. She will follow-up in 4 months. She will contact our office if she is not tolerating the medication.  Jill Alexanders MD 11/17/2015 7:54 PM  [facility] [address] [place], [place] [address]  Phone [phone] Fax [phone]

## 2015-11-18 ENCOUNTER — Ambulatory Visit (HOSPITAL_COMMUNITY): Payer: Medicare Other | Admitting: Physical Therapy

## 2015-11-18 DIAGNOSIS — R2681 Unsteadiness on feet: Secondary | ICD-10-CM

## 2015-11-18 DIAGNOSIS — M4004 Postural kyphosis, thoracic region: Secondary | ICD-10-CM

## 2015-11-18 DIAGNOSIS — R262 Difficulty in walking, not elsewhere classified: Secondary | ICD-10-CM

## 2015-11-18 DIAGNOSIS — R6889 Other general symptoms and signs: Secondary | ICD-10-CM

## 2015-11-18 DIAGNOSIS — R29898 Other symptoms and signs involving the musculoskeletal system: Secondary | ICD-10-CM

## 2015-11-18 NOTE — Therapy (Signed)
[place] [place] [address] [place], [place], [address] Phone: [phone]   Fax:  [phone]  Physical Therapy Treatment  Patient Details  Name: Kerry Flynn MRN: [id number] Date of Birth: September 24, 1927 Referring Provider: Moshe Cipro  Encounter Date: 11/18/2015      PT End of Session - 11/18/15 1150    Visit Number 7   Number of Visits 12   Date for PT Re-Evaluation 11/21/15   Authorization Type U[employer] Medicare   Authorization Time Period 10/21/15-12/20/15   Authorization - Visit Number 8   Authorization - Number of Visits 10   PT Start Time 1102   PT Stop Time 1140   PT Time Calculation (min) 38 min   Activity Tolerance Patient tolerated treatment well   Behavior During Therapy [facility] for tasks assessed/performed      Past Medical History  Diagnosis Date  . Hemorrhoids   . Intermittent vertigo   . Osteoporosis     knees  . Benign recurrent vertigo   . Vitamin D deficiency   . Head trauma     status post fall; upper and lower extremities   . GERD (gastroesophageal reflux disease)   . Diverticulosis   . Chronic constipation   . Dermatitis     recurrent  . Hypertension   . Barrett's esophagus     Dr. Laural Golden, Dr. Moshe Cipro  . Infertility, female   . Peripheral edema     chronic  . Chronic knee pain   . Fatigue     chronic  . Anemia   . Hip fracture, left ([place])     s/p left arthroplasty 2/16  . Osteoarthritis     of the neck  . DDD (degenerative disc disease), lumbar   . DDD (degenerative disc disease), cervical   . Varicose veins   . Hemorrhoid   . Stable angina (HCC)   . Colon polyps   . Abnormality of gait 11/17/2015  . Parkinson disease ([place]) 11/17/2015  . 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 Knee arthroscopy Right 1999  . Knee arthroscopy Left 1996  . Cholecystectomy, laparoscopic  06/17/2006    with stones  . Rotator cuff repair  05/14/2010  . Cataract  extraction w/ intraocular lens implant Left 01/06/2012    left, [facility]  . Eye surgery  01/24/2012    bilateral cataract extraction  . Colonoscopy  04/19/2012    Procedure: COLONOSCOPY;  Surgeon: Rogene Houston, MD;  Location: [facility];  Service: Endoscopy;  Laterality: N/A;  1200  . Tonsillectomy and adenoidectomy  1934  . Palate surgery  1988  . Dental implant   09/2014  . Hip arthroplasty Left 01/31/2015    Procedure: ARTHROPLASTY BIPOLAR HIP;  Surgeon: Carole Civil, MD;  Location: [facility] ORS;  Service: Orthopedics;  Laterality: Left;  . Colonoscopy w/ polypectomy  05/29/1999, 01/12/2012  . Esophagogastroduodenoscopy  01/12/2012  . Upper gi endoscopy  12/12/2006    with biopsy, with colonoscopy - dx: Barrett's disease  . Transthoracic echocardiogram  01/2012    EF=>55%; mild mitral annular calcification & mild MR; trace TR; trace pulm valve regurg  . Nm myocar perf wall motion  02/2012    lexiscan - small, fized apical lateral bowel attenuation artifact; no reversible ischemia; EF 77%; non-diagnostic for ischemia; low risk  . Cardiac catheterization  09/02/2005    mild-mod calcification in mid LAD (20-30% luminal obstruction), otherwise normal coronaries, patent LE  arteries (Dr. Jackie Plum)  . Carotid doppler  02/2008    R & L ICAs 0-49% diameter reduction  . Cholecystectomy    . Tonsillectomy    . Colonoscopy N/A 09/25/2015    Procedure: COLONOSCOPY;  Surgeon: Rogene Houston, MD;  Location: [facility];  Service: Endoscopy;  Laterality: N/A;  100    There were no vitals filed for this visit.  Visit Diagnosis:  Unsteady gait  Difficulty walking  Decreased functional activity tolerance  Weakness of both legs  Postural kyphosis of thoracic region      Subjective Assessment - 11/18/15 1115    Subjective Pt reports that she saw a neurologist yesterday, who confirmed her previous MD's opinion of Parkinsonism symptoms.    Currently in Pain? Yes   Pain Score 7    Pain  Location Knee   Pain Orientation Right;Left                [facility] Adult PT Treatment/Exercise - 11/18/15 0001    Neck Exercises: Seated   Postural Training Seated T's, thoracic extension, and thoracic extension against swiss ball x 10 each   Neck Exercises: Supine   Shoulder ABduction 15 reps   Shoulder Abduction Limitations supine T's with RTB   Upper Extremity D2 10 reps;Theraband   Theraband Level (UE D2) Level 2 (Red)   Lumbar Exercises: Standing   Scapular Retraction 15 reps   Theraband Level (Scapular Retraction) Level 3 (Green)   Row 15 reps   Theraband Level (Row) Level 3 (Green)   Shoulder Extension 15 reps   Theraband Level (Shoulder Extension) Level 3 (Green)   Lumbar Exercises: Supine   Bridge 15 reps   Bridge Limitations on wedge   Neck Exercises: Stretches   Other Neck Stretches Supine with single pillow x1 minute to improve posture                  PT Short Term Goals - 10/21/15 1214    PT SHORT TERM GOAL #1   Title Pt will be independent with HEP.    Time 3   Period Weeks   Status New   PT SHORT TERM GOAL #2   Title Improve BLE strength as evidenced by five time sit to stand time of 35 seconds or less with no UE support.    Time 3   Period Weeks   Status New   PT SHORT TERM GOAL #3   Title Improve functional activity tolerance evidenced by pt ambulating 275 feet or more on 3 minute walk test.    Time 3   Period Weeks   Status New   PT SHORT TERM GOAL #4   Title Decrease fall risk per TUG time of 25 seconds or less.    Time 3   Period Weeks   Status New           PT Long Term Goals - 10/21/15 1215    PT LONG TERM GOAL #1   Title Pt will be independent with advanced HEP for functional strengthening, improving posture, and improving balance.    Time 6   Period Weeks   Status New   PT LONG TERM GOAL #2   Title Improve BLE strength evidenced by five time sit to stand time of 20 seconds or less with no UE support.    Time 6    Period Weeks   Status New   PT LONG TERM GOAL #3   Title Improve functional activity tolerance  evidenced by pt ambulating 450' during 3 minute walk test.    Time 6   Period Weeks   Status New   PT LONG TERM GOAL #4   Title Decrease fall risk per TUG time of 18 seconds or less.    Time 6   Period Weeks   Status New   PT LONG TERM GOAL #5   Title Pt will report that she has completed grocery shopping with caregiver, ambulating through grocery store with rollator and <3 rest breaks.    Time 6   Period Weeks   Status New               Plan - 11/18/15 1152    Clinical Impression Statement Pt presented to PT with reports of increased fatigue following busy day of MD appointments yesterday. Pt had increased difficulty completing therex today due to fatigue, requiring verbal and tactile cueing to maintain upright posture. Postural stretching was added with swiss ball behind back in order to stretch tight pectoral musculature and improve thoracic extension. Pt denied any increased pain post treatment.    PT Next Visit Plan Continue with postural strengthening and stretching, balance and gait training        Problem List Patient Active Problem List   Diagnosis Date Noted  . Abnormality of gait 11/17/2015  . Parkinson disease ([place]) 11/17/2015  . 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 Unsteady gait 10/15/2015  . Hearing loss in left ear 10/15/2015  . Osteoarthritis of both knees 08/27/2015  . Tremor 08/27/2015  . Depression 03/25/2015  . Anemia 01/31/2015  . Femoral neck fracture ([place]) 01/31/2015  . Faintness   . Fall at home 04/04/2014  . Multiple thyroid nodules 04/04/2014  . GERD (gastroesophageal reflux disease) 04/04/2014  . Postmenopausal atrophic vaginitis 03/12/2014  . Vitamin D deficiency 03/12/2014  . Osteopetrosis 10/23/2013  . Urinary incontinence, urge 10/23/2013  . Thoracic back pain 10/23/2013  . Lumbar back pain 10/23/2013  . Colon polyps 02/27/2012  . CAD,  NATIVE VESSEL 12/12/2009  . Bilateral carotid bruits 12/12/2009  . ALLERGIC URTICARIA 09/09/2009  . Osteoarthrosis, unspecified whether generalized or localized, involving lower leg 04/19/2009  . Essential hypertension 10/16/2008  . FATIGUE 10/16/2008  . HEMORRHOIDS 03/29/2008  . INTERMITTENT VERTIGO 03/29/2008    Hilma Favors, PT, DPT [phone] 11/18/2015, 12:04 PM  [place], [place], [address] Phone: [phone]   Fax:  [phone]  Name: Kerry Flynn MRN: [id number] Date of Birth: 1927-05-11

## 2015-11-19 ENCOUNTER — Ambulatory Visit (HOSPITAL_COMMUNITY): Payer: Medicare Other | Admitting: Physical Therapy

## 2015-11-19 DIAGNOSIS — R6889 Other general symptoms and signs: Secondary | ICD-10-CM

## 2015-11-19 DIAGNOSIS — R262 Difficulty in walking, not elsewhere classified: Secondary | ICD-10-CM

## 2015-11-19 DIAGNOSIS — Z9181 History of falling: Secondary | ICD-10-CM

## 2015-11-19 DIAGNOSIS — R2681 Unsteadiness on feet: Secondary | ICD-10-CM | POA: Diagnosis not present

## 2015-11-19 DIAGNOSIS — M4004 Postural kyphosis, thoracic region: Secondary | ICD-10-CM

## 2015-11-19 DIAGNOSIS — R29898 Other symptoms and signs involving the musculoskeletal system: Secondary | ICD-10-CM

## 2015-11-19 NOTE — Therapy (Signed)
[place], [place], [address] Phone: [phone]   Fax:  [phone]  Physical Therapy Treatment (Re-Assessment)  Patient Details  Name: Kerry Flynn MRN: [id number] Date of Birth: Jan 27, 1927 Referring Provider: Moshe Cipro  Encounter Date: 11/19/2015      PT End of Session - 11/19/15 1356    Visit Number 8   Number of Visits 16   Authorization Type U[employer] Medicare   Authorization Time Period 10/21/15-12/20/15   Authorization - Visit Number 9   Authorization - Number of Visits 19   PT Start Time [id number]   PT Stop Time 1346   PT Time Calculation (min) 44 min   Activity Tolerance Patient tolerated treatment well   Behavior During Therapy [facility] for tasks assessed/performed      Past Medical History  Diagnosis Date  . Hemorrhoids   . Intermittent vertigo   . Osteoporosis     knees  . Benign recurrent vertigo   . Vitamin D deficiency   . Head trauma     status post fall; upper and lower extremities   . GERD (gastroesophageal reflux disease)   . Diverticulosis   . Chronic constipation   . Dermatitis     recurrent  . Hypertension   . Barrett's esophagus     Dr. Laural Golden, Dr. Moshe Cipro  . Infertility, female   . Peripheral edema     chronic  . Chronic knee pain   . Fatigue     chronic  . Anemia   . Hip fracture, left ([place])     s/p left arthroplasty 2/16  . Osteoarthritis     of the neck  . DDD (degenerative disc disease), lumbar   . DDD (degenerative disc disease), cervical   . Varicose veins   . Hemorrhoid   . Stable angina (H[facility])   . Colon polyps   . Abnormality of gait 11/17/2015  . Parkinson disease ([place]) 11/17/2015  . 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 Knee arthroscopy Right 1999  . Knee arthroscopy Left 1996  . Cholecystectomy, laparoscopic  06/17/2006    with stones  . Rotator cuff repair  05/14/2010  . Cataract extraction w/ intraocular  lens implant Left 01/06/2012    left, [facility]  . Eye surgery  01/24/2012    bilateral cataract extraction  . Colonoscopy  04/19/2012    Procedure: COLONOSCOPY;  Surgeon: Rogene Houston, MD;  Location[facility];  Service: Endoscopy;  Laterality: N/A;  1200  . Tonsillectomy and adenoidectomy  1934  . Palate surgery  1988  . Dental implant   09/2014  . Hip arthroplasty Left 01/31/2015    Procedure: ARTHROPLASTY BIPOLAR HIP;  Surgeon: Carole Civil, MD;  Location: [facility] ORS;  Service: Orthopedics;  Laterality: Left;  . Colonoscopy w/ polypectomy  05/29/1999, 01/12/2012  . Esophagogastroduodenoscopy  01/12/2012  . Upper gi endoscopy  12/12/2006    with biopsy, with colonoscopy - dx: Barrett's disease  . Transthoracic echocardiogram  01/2012    EF=>55%; mild mitral annular calcification & mild MR; trace TR; trace pulm valve regurg  . Nm myocar perf wall motion  02/2012    lexiscan - small, fized apical lateral bowel attenuation artifact; no reversible ischemia; EF 77%; non-diagnostic for ischemia; low risk  . Cardiac catheterization  09/02/2005    mild-mod calcification in mid LAD (20-30% luminal obstruction), otherwise normal coronaries, patent LE arteries (Dr. Jackie Plum)  .  Carotid doppler  02/2008    R & L ICAs 0-49% diameter reduction  . Cholecystectomy    . Tonsillectomy    . Colonoscopy N/A 09/25/2015    Procedure: COLONOSCOPY;  Surgeon: Rogene Houston, MD;  Location: [facility];  Service: Endoscopy;  Laterality: N/A;  100    There were no vitals filed for this visit.  Visit Diagnosis:  Unsteady gait  Difficulty walking  Decreased functional activity tolerance  Weakness of both legs  Postural kyphosis of thoracic region  Risk for falls      Subjective Assessment - 11/19/15 1304    Subjective Patient arrived with caregiver; they report that her Parkinsons diagnosis has been confirmed by neurologist but they do feel like she has gotten better with skilled PT services.  Patient and caregiver report that patient was able to drive to [place] the other day.l    Pertinent History Partial hip replacement 01/31/15 on LLE.    How long can you stand comfortably? 11/23- around 30 minutes    How long can you walk comfortably? 11/23- unable to identify specific time frame, but feels that she has improved with this    Patient Stated Goals Be able to walk long enough to go grocery shopping   Currently in Pain? Yes   Pain Score 6    Pain Location Knee   Pain Orientation Right;Left            [facility] PT Assessment - 11/19/15 0001    Strength   Right Hip Flexion 4/5   Right Hip Extension 2+/5  appears limited by jiont limitations    Right Hip ABduction 3/5   Left Hip Flexion 4-/5   Left Hip Extension 2/5  appears limited by jiont limitations    Left Hip ABduction 2+/5   Right Knee Flexion 4-/5   Right Knee Extension 4+/5   Left Knee Flexion 3+/5   Left Knee Extension 4+/5   Right Ankle Dorsiflexion 4+/5   Left Ankle Dorsiflexion 4+/5   Transfers   Five time sit to stand comments  28.00   6 minute walk test results    Aerobic Endurance Distance Walked 220   Endurance additional comments 3 minute walk; gait speed 0.41 m/s    High Level Balance   High Level Balance Comments TUG 29.6, 31.4, 34.3                             PT Education - 11/19/15 1355    Education provided Yes   Education Details progress with skilled PT services, plan of care moving forward, recommended that patient reach out to MD to request Parkinsons specific referral    Person(s) Educated Patient;Caregiver(s)   Methods Explanation   Comprehension Verbalized understanding          PT Short Term Goals - 11/19/15 1333    PT SHORT TERM GOAL #1   Title Pt will be independent with HEP.    Baseline 11/23- needs help from caregiver, is doing HEP on days that she does not have PT    Time 3   Period Weeks   Status Achieved   PT SHORT TERM GOAL #2   Title  Improve BLE strength as evidenced by five time sit to stand time of 35 seconds or less with no UE support.    Baseline 11/23- 28 seconds    Time 3   Period Weeks   Status Achieved  PT SHORT TERM GOAL #3   Title Improve functional activity tolerance evidenced by pt ambulating 275 feet or more on 3 minute walk test.    Baseline 11/23- 255ft with walker, no rest breaks    Time 3   Period Weeks   Status On-going   PT SHORT TERM GOAL #4   Title Decrease fall risk per TUG time of 25 seconds or less.    Baseline 11/23- 29 seconds at best with walker    Period Weeks   Status On-going   PT SHORT TERM GOAL #5   Title Patient will demonstrate improved posture at least 75% of the time in order to improve overall mobility, balance, and gait mechanics as well as to reduce fall risk    Time 3   Period Weeks   Status New           PT Long Term Goals - 11/19/15 1335    PT LONG TERM GOAL #1   Title Pt will be independent with advanced HEP for functional strengthening, improving posture, and improving balance.    Time 6   Period Weeks   Status On-going   PT LONG TERM GOAL #2   Title Improve BLE strength evidenced by five time sit to stand time of 20 seconds or less with no UE support.    Time 6   Period Weeks   Status On-going   PT LONG TERM GOAL #3   Title Improve functional activity tolerance evidenced by pt ambulating 450' during 3 minute walk test.    Time 6   Period Weeks   Status On-going   PT LONG TERM GOAL #4   Title Decrease fall risk per TUG time of 18 seconds or less.    Time 6   Period Weeks   Status On-going   PT LONG TERM GOAL #5   Title Pt will report that she has completed grocery shopping with caregiver, ambulating through grocery store with rollator and <3 rest breaks.    Baseline 11/23- reports that she has been staying in car while caregiver shops, or has been using wheelchair    Time 6   Period Weeks   Status On-going   Additional [place] #6   Title Patient to demonstrate the ability to safely perform perform floor to stand transfer with min guard and good sequencing in order to allow her to safely recover from any possible falls at home    Time [place] - 11/19/15 1358    Clinical Impression Statement Re-assessment performed today. Patient does show some improvement with functional activity tolerance, LE power, mild improvement in balance, and does state improved postural awarenses although posture will contniue to be a focus in PT. Major remaining limitations include hip stiffness, especially with extension range, posture, strength, balance, and functional activity tolerance. Patient reports that she has a new Parkinson's diagnosis and was encouraged to request Parkinsons specific referral from MD so skilled PT staff can treat this as well. Recommend that at this time patient continue with skilled PT services in order to address functional limtations, reach optimal level of function, and reduce fall risk moving forward.    Pt will benefit from skilled therapeutic intervention in order to improve on the following deficits Decreased activity tolerance;Decreased balance;Abnormal gait;Decreased  endurance;Decreased mobility;Decreased strength;Difficulty walking;Postural dysfunction   Rehab Potential Good   PT Frequency 2x / week   PT Duration 4 weeks   PT Treatment/Interventions ADLs/Self Care Home Management;Moist Heat;Gait training;Stair training;Functional mobility training;Therapeutic activities;Therapeutic exercise;Balance training;Neuromuscular re-education;Patient/family education;Manual techniques   PT Next Visit Plan Continue with postural strengthening and stretching, balance and gait training; follow up on Parkinson's referral    PT Home Exercise Plan no changes    Consulted and Agree with Plan of Care Patient          G-Codes -  11-28-15 1403    Functional Assessment Tool Used Based on skilled clinical assessment of strength, balance, posture, gait, overall fall risk    Functional Limitation Mobility: Walking and moving around   Mobility: Walking and Moving Around Current Status [phone]) At least 60 percent but less than 80 percent impaired, limited or restricted   Mobility: Walking and Moving Around Goal Status [phone]) At least 40 percent but less than 60 percent impaired, limited or restricted      Problem List Patient Active Problem List   Diagnosis Date Noted  . Abnormality of gait 11/17/2015  . Parkinson disease ([place]) 11/17/2015  . 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 Unsteady gait 10/15/2015  . Hearing loss in left ear 10/15/2015  . Osteoarthritis of both knees 08/27/2015  . Tremor 08/27/2015  . Depression 03/25/2015  . Anemia 01/31/2015  . Femoral neck fracture ([place]) 01/31/2015  . Faintness   . Fall at home 04/04/2014  . Multiple thyroid nodules 04/04/2014  . GERD (gastroesophageal reflux disease) 04/04/2014  . Postmenopausal atrophic vaginitis 03/12/2014  . Vitamin D deficiency 03/12/2014  . Osteopetrosis 10/23/2013  . Urinary incontinence, urge 10/23/2013  . Thoracic back pain 10/23/2013  . Lumbar back pain 10/23/2013  . Colon polyps 02/27/2012  . CAD, NATIVE VESSEL 12/12/2009  . Bilateral carotid bruits 12/12/2009  . ALLERGIC URTICARIA 09/09/2009  . Osteoarthrosis, unspecified whether generalized or localized, involving lower leg 04/19/2009  . Essential hypertension 10/16/2008  . FATIGUE 10/16/2008  . HEMORRHOIDS 03/29/2008  . INTERMITTENT VERTIGO 03/29/2008   Physical Therapy Progress Note  Dates of Reporting Period: 10/21/15 to 11-28-2015  Objective Reports of Subjective Statement: see above   Objective Measurements: see above   Goal Update: see above   Plan: see above   Reason Skilled Services are Required: impairments in strength, balance, posture, activity tolerance, gait;  remains high fall risk and has new Parkinsons diagnosis    Deniece Ree PT, DPT [place], [place], [address] Phone: [phone]   Fax:  [phone]  Name: ARRYN DEWAR MRN: [id number] Date of Birth: Jun 22, 1927

## 2015-11-25 ENCOUNTER — Ambulatory Visit (HOSPITAL_COMMUNITY): Payer: Medicare Other | Admitting: Physical Therapy

## 2015-11-25 DIAGNOSIS — R262 Difficulty in walking, not elsewhere classified: Secondary | ICD-10-CM

## 2015-11-25 DIAGNOSIS — M4004 Postural kyphosis, thoracic region: Secondary | ICD-10-CM

## 2015-11-25 DIAGNOSIS — R29898 Other symptoms and signs involving the musculoskeletal system: Secondary | ICD-10-CM

## 2015-11-25 DIAGNOSIS — R2681 Unsteadiness on feet: Secondary | ICD-10-CM

## 2015-11-25 DIAGNOSIS — R6889 Other general symptoms and signs: Secondary | ICD-10-CM

## 2015-11-25 NOTE — Therapy (Signed)
[place], [place], [address] Phone: [phone]   Fax:  [phone]  Physical Therapy Treatment  Patient Details  Name: Kerry Flynn MRN: [id number] Date of Birth: [birth year] Referring Provider: Moshe Cipro  Encounter Date: 11/25/2015      PT End of Session - 11/25/15 1356    Visit Number 9   Number of Visits 16   Date for PT Re-Evaluation 12/18/14   Authorization Type UHC Medicare   Authorization Time Period 10/21/15-12/20/15   Authorization - Visit Number 10   Authorization - Number of Visits 19   PT Start Time 1302   PT Stop Time 1345   PT Time Calculation (min) 43 min   Equipment Utilized During Treatment Gait belt   Activity Tolerance Patient tolerated treatment well   Behavior During Therapy [facility] for tasks assessed/performed      Past Medical History  Diagnosis Date  . Hemorrhoids   . Intermittent vertigo   . Osteoporosis     knees  . Benign recurrent vertigo   . Vitamin D deficiency   . Head trauma     status post fall; upper and lower extremities   . GERD (gastroesophageal reflux disease)   . Diverticulosis   . Chronic constipation   . Dermatitis     recurrent  . Hypertension   . Barrett's esophagus     Dr. Laural Golden, Dr. Moshe Cipro  . Infertility, female   . Peripheral edema     chronic  . Chronic knee pain   . Fatigue     chronic  . Anemia   . Hip fracture, left ([place])     s/p left arthroplasty 2/16  . Osteoarthritis     of the neck  . DDD (degenerative disc disease), lumbar   . DDD (degenerative disc disease), cervical   . Varicose veins   . Hemorrhoid   . Stable angina (HCC)   . Colon polyps   . Abnormality of gait 11/17/2015  . Parkinson disease ([place]) 11/17/2015  . 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 Knee arthroscopy Right 1999  . Knee arthroscopy Left 1996  . Cholecystectomy, laparoscopic  06/17/2006    with stones  .  Rotator cuff repair  05/14/2010  . Cataract extraction w/ intraocular lens implant Left 01/06/2012    left, [facility]  . Eye surgery  01/24/2012    bilateral cataract extraction  . Colonoscopy  04/19/2012    Procedure: COLONOSCOPY;  Surgeon: Rogene Houston, MD;  Location[facility];  Service: Endoscopy;  Laterality: N/A;  1200  . Tonsillectomy and adenoidectomy  1934  . Palate surgery  1988  . Dental implant   09/2014  . Hip arthroplasty Left 01/31/2015    Procedure: ARTHROPLASTY BIPOLAR HIP;  Surgeon: Carole Civil, MD;  Location: [facility] ORS;  Service: Orthopedics;  Laterality: Left;  . Colonoscopy w/ polypectomy  05/29/1999, 01/12/2012  . Esophagogastroduodenoscopy  01/12/2012  . Upper gi endoscopy  12/12/2006    with biopsy, with colonoscopy - dx: Barrett's disease  . Transthoracic echocardiogram  01/2012    EF=>55%; mild mitral annular calcification & mild MR; trace TR; trace pulm valve regurg  . Nm myocar perf wall motion  02/2012    lexiscan - small, fized apical lateral bowel attenuation artifact; no reversible ischemia; EF 77%; non-diagnostic for ischemia; low risk  . Cardiac catheterization  09/02/2005    mild-mod calcification in mid LAD (  20-30% luminal obstruction), otherwise normal coronaries, patent LE arteries (Dr. Jackie Plum)  . Carotid doppler  02/2008    R & L ICAs 0-49% diameter reduction  . Cholecystectomy    . Tonsillectomy    . Colonoscopy N/A 09/25/2015    Procedure: COLONOSCOPY;  Surgeon: Rogene Houston, MD;  Location: [facility];  Service: Endoscopy;  Laterality: N/A;  100    There were no vitals filed for this visit.  Visit Diagnosis:  Unsteady gait  Difficulty walking  Decreased functional activity tolerance  Weakness of both legs  Postural kyphosis of thoracic region      Subjective Assessment - 11/25/15 1307    Subjective Pt reports that she is feeling pretty good today, she has a little bit of pain in her knees when she is walking.    Currently in  Pain? Yes   Pain Score 5    Pain Location Knee   Pain Orientation Right;Left                         [facility] Adult PT Treatment/Exercise - 11/25/15 0001    Neck Exercises: Supine   Neck Retraction 15 reps;3 secs   Shoulder ABduction 15 reps   Shoulder Abduction Limitations supine T's with GTB   Lumbar Exercises: Standing   Other Standing Lumbar Exercises good mornings from elevated mat table x 10   Lumbar Exercises: Seated   Long Arc Quad on Chair Both;15 reps;Weights   LAQ on Chair Weights (lbs) 3   Sit to Stand Limitations seated cone rotation with feet off of ground x 2   Other Seated Lumbar Exercises seated thoracic/lumbar extension against swiss ball x 15   Lumbar Exercises: Supine   Bridge 15 reps   Bridge Limitations on wedge                  PT Short Term Goals - 11/19/15 1333    PT SHORT TERM GOAL #1   Title Pt will be independent with HEP.    Baseline 11/23- needs help from caregiver, is doing HEP on days that she does not have PT    Time 3   Period Weeks   Status Achieved   PT SHORT TERM GOAL #2   Title Improve BLE strength as evidenced by five time sit to stand time of 35 seconds or less with no UE support.    Baseline 11/23- 28 seconds    Time 3   Period Weeks   Status Achieved   PT SHORT TERM GOAL #3   Title Improve functional activity tolerance evidenced by pt ambulating 275 feet or more on 3 minute walk test.    Baseline 11/23- 224ft with walker, no rest breaks    Time 3   Period Weeks   Status On-going   PT SHORT TERM GOAL #4   Title Decrease fall risk per TUG time of 25 seconds or less.    Baseline 11/23- 29 seconds at best with walker    Period Weeks   Status On-going   PT SHORT TERM GOAL #5   Title Patient will demonstrate improved posture at least 75% of the time in order to improve overall mobility, balance, and gait mechanics as well as to reduce fall risk    Time 3   Period Weeks   Status New           PT Long  Term Goals - 11/19/15 1335    PT  LONG TERM GOAL #1   Title Pt will be independent with advanced HEP for functional strengthening, improving posture, and improving balance.    Time 6   Period Weeks   Status On-going   PT LONG TERM GOAL #2   Title Improve BLE strength evidenced by five time sit to stand time of 20 seconds or less with no UE support.    Time 6   Period Weeks   Status On-going   PT LONG TERM GOAL #3   Title Improve functional activity tolerance evidenced by pt ambulating 450' during 3 minute walk test.    Time 6   Period Weeks   Status On-going   PT LONG TERM GOAL #4   Title Decrease fall risk per TUG time of 18 seconds or less.    Time 6   Period Weeks   Status On-going   PT LONG TERM GOAL #5   Title Pt will report that she has completed grocery shopping with caregiver, ambulating through grocery store with rollator and <3 rest breaks.    Baseline 11/23- reports that she has been staying in car while caregiver shops, or has been using wheelchair    Time 6   Period Weeks   Status On-going   Additional [place] #6   Title Patient to demonstrate the ability to safely perform perform floor to stand transfer with min guard and good sequencing in order to allow her to safely recover from any possible falls at home    Time [place] - 11/25/15 1357    Clinical Impression Statement Continued with postural strengthening, LE strengthening, and balance training in today's treatment. Supine chin tucks were added today to improve cervical alignment and deep neck flexor strength, pt required verbal and tactile cueing to complete with proper form. Seated thoracic extension into swiss ball and standing good morning extension exercises were completed to improve strength of thoracolumbar extensors in order to improve posture and decrease falls. Pt reported increased knee pain  folllowing good morning exercise, remainder of therex was completed in sitting to decrease pain.    PT Next Visit Plan Continue with postural strengthening and stretching, seated and standing balance training, f/u regarding Parkinson's referral        Problem List Patient Active Problem List   Diagnosis Date Noted  . Abnormality of gait 11/17/2015  . Parkinson disease ([place]) 11/17/2015  . 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 Unsteady gait 10/15/2015  . Hearing loss in left ear 10/15/2015  . Osteoarthritis of both knees 08/27/2015  . Tremor 08/27/2015  . Depression 03/25/2015  . Anemia 01/31/2015  . Femoral neck fracture ([place]) 01/31/2015  . Faintness   . Fall at home 04/04/2014  . Multiple thyroid nodules 04/04/2014  . GERD (gastroesophageal reflux disease) 04/04/2014  . Postmenopausal atrophic vaginitis 03/12/2014  . Vitamin D deficiency 03/12/2014  . Osteopetrosis 10/23/2013  . Urinary incontinence, urge 10/23/2013  . Thoracic back pain 10/23/2013  . Lumbar back pain 10/23/2013  . Colon polyps 02/27/2012  . CAD, NATIVE VESSEL 12/12/2009  . Bilateral carotid bruits 12/12/2009  . ALLERGIC URTICARIA 09/09/2009  . Osteoarthrosis, unspecified whether generalized or localized, involving lower leg 04/19/2009  . Essential hypertension 10/16/2008  . FATIGUE 10/16/2008  . HEMORRHOIDS 03/29/2008  . INTERMITTENT VERTIGO 03/29/2008  Hilma Favors, PT, DPT [phone] 11/25/2015, 2:02 PM  [place], [place], [address] Phone: [phone]   Fax:  [phone]  Name: Kerry Flynn MRN: [id number] Date of Birth: September 18, 1927

## 2015-11-27 ENCOUNTER — Encounter (HOSPITAL_COMMUNITY): Payer: Medicare Other

## 2015-11-27 ENCOUNTER — Ambulatory Visit (INDEPENDENT_AMBULATORY_CARE_PROVIDER_SITE_OTHER): Payer: Medicare Other | Admitting: Otolaryngology

## 2015-11-27 DIAGNOSIS — H903 Sensorineural hearing loss, bilateral: Secondary | ICD-10-CM | POA: Diagnosis not present

## 2015-11-28 ENCOUNTER — Other Ambulatory Visit: Payer: Self-pay

## 2015-11-28 MED ORDER — AMLODIPINE BESYLATE 5 MG PO TABS: ORAL_TABLET | ORAL | Status: DC

## 2015-12-02 ENCOUNTER — Ambulatory Visit: Payer: Medicare Other

## 2015-12-03 ENCOUNTER — Ambulatory Visit (HOSPITAL_COMMUNITY): Payer: Medicare Other | Attending: Family Medicine | Admitting: Physical Therapy

## 2015-12-03 DIAGNOSIS — Z9181 History of falling: Secondary | ICD-10-CM | POA: Diagnosis present

## 2015-12-03 DIAGNOSIS — R262 Difficulty in walking, not elsewhere classified: Secondary | ICD-10-CM | POA: Insufficient documentation

## 2015-12-03 DIAGNOSIS — R2681 Unsteadiness on feet: Secondary | ICD-10-CM | POA: Insufficient documentation

## 2015-12-03 DIAGNOSIS — G20 Parkinson's disease: Secondary | ICD-10-CM | POA: Insufficient documentation

## 2015-12-03 DIAGNOSIS — M4004 Postural kyphosis, thoracic region: Secondary | ICD-10-CM | POA: Diagnosis present

## 2015-12-03 DIAGNOSIS — R6889 Other general symptoms and signs: Secondary | ICD-10-CM | POA: Diagnosis present

## 2015-12-03 DIAGNOSIS — R29898 Other symptoms and signs involving the musculoskeletal system: Secondary | ICD-10-CM

## 2015-12-03 NOTE — Therapy (Signed)
[place], [place], [address] Phone: [phone]   Fax:  [phone]  Physical Therapy Treatment  Patient Details  Name: Kerry Flynn MRN: [id number] Date of Birth: [birth year] Referring Provider: Moshe Cipro   Encounter Date: 12/03/2015      PT End of Session - 12/03/15 1843    Visit Number 10   Number of Visits 16   Date for PT Re-Evaluation 12/18/14   Authorization Type UHC Medicare   Authorization Time Period 10/21/15-12/20/15   Authorization - Visit Number 11   Authorization - Number of Visits 19   PT Start Time [id number]   PT Stop Time 1645   PT Time Calculation (min) 41 min   Equipment Utilized During Treatment Gait belt   Activity Tolerance Patient tolerated treatment well   Behavior During Therapy [facility] for tasks assessed/performed      Past Medical History  Diagnosis Date  . Hemorrhoids   . Intermittent vertigo   . Osteoporosis     knees  . Benign recurrent vertigo   . Vitamin D deficiency   . Head trauma     status post fall; upper and lower extremities   . GERD (gastroesophageal reflux disease)   . Diverticulosis   . Chronic constipation   . Dermatitis     recurrent  . Hypertension   . Barrett's esophagus     Dr. Laural Golden, Dr. Moshe Cipro  . Infertility, female   . Peripheral edema     chronic  . Chronic knee pain   . Fatigue     chronic  . Anemia   . Hip fracture, left ([place])     s/p left arthroplasty 2/16  . Osteoarthritis     of the neck  . DDD (degenerative disc disease), lumbar   . DDD (degenerative disc disease), cervical   . Varicose veins   . Hemorrhoid   . Stable angina (HCC)   . Colon polyps   . Abnormality of gait 11/17/2015  . Parkinson disease ([place]) 11/17/2015  . 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 Knee arthroscopy Right 1999  . Knee arthroscopy Left 1996  . Cholecystectomy, laparoscopic  06/17/2006    with stones   . Rotator cuff repair  05/14/2010  . Cataract extraction w/ intraocular lens implant Left 01/06/2012    left, [facility]  . Eye surgery  01/24/2012    bilateral cataract extraction  . Colonoscopy  04/19/2012    Procedure: COLONOSCOPY;  Surgeon: Rogene Houston, MD;  Location[facility];  Service: Endoscopy;  Laterality: N/A;  1200  . Tonsillectomy and adenoidectomy  1934  . Palate surgery  1988  . Dental implant   09/2014  . Hip arthroplasty Left 01/31/2015    Procedure: ARTHROPLASTY BIPOLAR HIP;  Surgeon: Carole Civil, MD;  Location: [facility] ORS;  Service: Orthopedics;  Laterality: Left;  . Colonoscopy w/ polypectomy  05/29/1999, 01/12/2012  . Esophagogastroduodenoscopy  01/12/2012  . Upper gi endoscopy  12/12/2006    with biopsy, with colonoscopy - dx: Barrett's disease  . Transthoracic echocardiogram  01/2012    EF=>55%; mild mitral annular calcification & mild MR; trace TR; trace pulm valve regurg  . Nm myocar perf wall motion  02/2012    lexiscan - small, fized apical lateral bowel attenuation artifact; no reversible ischemia; EF 77%; non-diagnostic for ischemia; low risk  . Cardiac catheterization  09/02/2005    mild-mod calcification in mid  LAD (20-30% luminal obstruction), otherwise normal coronaries, patent LE arteries (Dr. Jackie Plum)  . Carotid doppler  02/2008    R & L ICAs 0-49% diameter reduction  . Cholecystectomy    . Tonsillectomy    . Colonoscopy N/A 09/25/2015    Procedure: COLONOSCOPY;  Surgeon: Rogene Houston, MD;  Location: [facility];  Service: Endoscopy;  Laterality: N/A;  100    There were no vitals filed for this visit.  Visit Diagnosis:  Unsteady gait  Difficulty walking  Decreased functional activity tolerance  Weakness of both legs  Postural kyphosis of thoracic region  Risk for falls  Parkinson's disease ([place])      Subjective Assessment - 12/03/15 1607    Subjective Patient reports that she has been feeling very off balance,she believes from  taking different dose of Sinemet   Pertinent History Partial hip replacement 01/31/15 on LLE.    Currently in Pain? Yes   Pain Score 6    Pain Location Knee   Pain Orientation Right;Left            [facility] PT Assessment - 12/03/15 0001    Assessment   Medical Diagnosis Unsteady gait and Parkinsons    Referring Provider Moshe Cipro    Next MD Visit end of the year/early next year    Balance Screen   Has the patient fallen in the past 6 months No   Has the patient had a decrease in activity level because of a fear of falling?  Yes   Is the patient reluctant to leave their home because of a fear of falling?  Yes   [place] there 8 hours per day   Available Help at Discharge Personal care attendant   Type of [place] Access Level entry   [place] Two level;Laundry or work area in basement;Bed/bath upstairs  split level home   Actor --  Lift chair   Prior Function   Level of Independence Independent with basic ADLs;Independent with household mobility with device;Needs assistance with homemaking   Vocation Retired   Leisure Enjoys watching TV   Observation/Other Assessments   Observations BP 160/65 with manual cuff; saccade tests negative; RAM tests negative    Posture/Postural Control   Posture Comments note typical parkinsonian posture with forward head, kyphosis, B IR shoulders, flexed at hips    Ambulation/Gait   Gait Comments difficulty with equal step lengths, increased posture and speed during gait, very unsteady without assistive device, shorter step lenghts and reduced heel-toe without cuing                      [facility] Adult PT Treatment/Exercise - 12/03/15 0001    Knee/Hip Exercises: Standing   Gait Training gait with and without metronome at 30-40BPM; patient shows improved gait speed, psture, and mechanics with metronome as compred to without metronome     Other Standing Knee Exercises standing reciprocal UE/LE flexion, lateral and AP weight shifts on floor (unable to tolerate rocker board); bakcwards gait; all of these to metronome at 30-50BPM, which was adjusted for each activity respectively    Knee/Hip Exercises: Seated   Other Seated Knee/Hip Exercises good morning reaches with forward reach and emphasis on laage movements for imporved mobility and posture; cross midline reaches for trunk rotations   Knee/Hip Exercises: Supine   Other Supine Knee/Hip Exercises supine stretch with no pillows, shoulders  in ER to promote improved posture                 PT Education - 12/03/15 1842    Education provided Yes   Education Details MD order for Parkinsons treatment has come back; general parkinsons information including BIG program, hypophonia, posture; for HEP, advised to focus on big voice, movements for now and this will be updated later    Person(s) Educated Caregiver(s);Patient   Methods Explanation   Comprehension Verbalized understanding          PT Short Term Goals - 12/03/15 1848    PT SHORT TERM GOAL #1   Title Pt will be independent with HEP.    Baseline 11/23- needs help from caregiver, is doing HEP on days that she does not have PT    Time 3   Period Weeks   Status Achieved   PT SHORT TERM GOAL #2   Title Improve BLE strength as evidenced by five time sit to stand time of 35 seconds or less with no UE support.    Baseline 11/23- 28 seconds    Time 3   Period Weeks   Status Achieved   PT SHORT TERM GOAL #3   Title Improve functional activity tolerance evidenced by pt ambulating 275 feet or more on 3 minute walk test.    Baseline 11/23- 212ft with walker, no rest breaks    Time 3   Period Weeks   Status On-going   PT SHORT TERM GOAL #4   Title Decrease fall risk per TUG time of 25 seconds or less.    Baseline 11/23- 29 seconds at best with walker    Time 3   Period Weeks   Status On-going   PT SHORT TERM GOAL  #5   Title Patient will demonstrate improved posture at least 75% of the time in order to improve overall mobility, balance, and gait mechanics as well as to reduce fall risk    Time 3   Period Weeks   Status New   Additional Short Term Goals   Additional Short Term Goals Yes   PT SHORT TERM GOAL #6   Title Patient to be regularly imprlementing BIG concepts of speech, movement, and mobility and will also be engaged with local Parkinsons Disease support group in order to promote self-efficacy over disease    Time 4   Period Weeks   Status New           PT Long Term Goals - 12/03/15 1850    PT LONG TERM GOAL #1   Title Pt will be independent with advanced HEP for functional strengthening, improving posture, and improving balance.    Time 6   Period Weeks   Status On-going   PT LONG TERM GOAL #2   Title Improve BLE strength evidenced by five time sit to stand time of 20 seconds or less with no UE support.    Time 6   Period Weeks   Status On-going   PT LONG TERM GOAL #3   Title Improve functional activity tolerance evidenced by pt ambulating 450' during 3 minute walk test.    Time 6   Period Weeks   Status On-going   PT LONG TERM GOAL #4   Title Decrease fall risk per TUG time of 18 seconds or less.    Time 6   Period Weeks   Status On-going   PT LONG TERM GOAL #5   Title Pt will report that  she has completed grocery shopping with caregiver, ambulating through grocery store with rollator and <3 rest breaks.    Baseline 11/23- reports that she has been staying in car while caregiver shops, or has been using wheelchair    Time 6   Period Weeks   Status On-going   PT LONG TERM GOAL #6   Title Patient to demonstrate the ability to safely perform perform floor to stand transfer with min guard and good sequencing in order to allow her to safely recover from any possible falls at home    Time 6   Period Weeks               Plan - 12/03/15 1845    Clinical Impression  Statement Parkinsons order has been returned from MD; however, since re-assessment was performed literally last session, deferred specific measures but did perform some parkinsons specific screens including visual saccade check, RAM, and blood pressure which was 160/65 per manual cuff. Focusd on parkinsons posture and mobility today, in which patient was somewhat  limited due to pre-existing shoulder and knee issues. Performed dynamic standing and gait activities in paralllel bars with metronome, which patient responded very well to. Gait much improved with metronome, however little carryoever when metronome removed. Functional goals are still appropriate even with addition of Parkinsons diagnosis to treatment; measures can be seen in most recent progress note.    Pt will benefit from skilled therapeutic intervention in order to improve on the following deficits Decreased activity tolerance;Decreased balance;Abnormal gait;Decreased endurance;Decreased mobility;Decreased strength;Difficulty walking;Postural dysfunction   Rehab Potential Good   PT Frequency 2x / week   PT Duration 4 weeks   PT Treatment/Interventions ADLs/Self Care Home Management;Moist Heat;Gait training;Stair training;Functional mobility training;Therapeutic activities;Therapeutic exercise;Balance training;Neuromuscular re-education;Patient/family education;Manual techniques   PT Next Visit Plan Parkinsons activities with metronome; postural strengthening and stretching; balance training    PT Home Exercise Plan no changes    Consulted and Agree with Plan of Care Patient        Problem List Patient Active Problem List   Diagnosis Date Noted  . Abnormality of gait 11/17/2015  . Parkinson disease ([place]) 11/17/2015  . 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 Unsteady gait 10/15/2015  . Hearing loss in left ear 10/15/2015  . Osteoarthritis of both knees 08/27/2015  . Tremor 08/27/2015  . Depression 03/25/2015  . Anemia 01/31/2015  .  Femoral neck fracture ([place]) 01/31/2015  . Faintness   . Fall at home 04/04/2014  . Multiple thyroid nodules 04/04/2014  . GERD (gastroesophageal reflux disease) 04/04/2014  . Postmenopausal atrophic vaginitis 03/12/2014  . Vitamin D deficiency 03/12/2014  . Osteopetrosis 10/23/2013  . Urinary incontinence, urge 10/23/2013  . Thoracic back pain 10/23/2013  . Lumbar back pain 10/23/2013  . Colon polyps 02/27/2012  . CAD, NATIVE VESSEL 12/12/2009  . Bilateral carotid bruits 12/12/2009  . ALLERGIC URTICARIA 09/09/2009  . Osteoarthrosis, unspecified whether generalized or localized, involving lower leg 04/19/2009  . Essential hypertension 10/16/2008  . FATIGUE 10/16/2008  . HEMORRHOIDS 03/29/2008  . INTERMITTENT VERTIGO 03/29/2008    Deniece Ree PT, DPT [place] [address] [place], [place], [address] Phone: [phone]   Fax:  [phone]  Name: Kerry Flynn MRN: [id number] Date of Birth: 08-31-1927

## 2015-12-05 ENCOUNTER — Ambulatory Visit (HOSPITAL_COMMUNITY): Payer: Medicare Other | Admitting: Physical Therapy

## 2015-12-05 DIAGNOSIS — G20 Parkinson's disease: Secondary | ICD-10-CM

## 2015-12-05 DIAGNOSIS — M4004 Postural kyphosis, thoracic region: Secondary | ICD-10-CM

## 2015-12-05 DIAGNOSIS — R262 Difficulty in walking, not elsewhere classified: Secondary | ICD-10-CM

## 2015-12-05 DIAGNOSIS — R6889 Other general symptoms and signs: Secondary | ICD-10-CM

## 2015-12-05 DIAGNOSIS — R2681 Unsteadiness on feet: Secondary | ICD-10-CM

## 2015-12-05 DIAGNOSIS — R29898 Other symptoms and signs involving the musculoskeletal system: Secondary | ICD-10-CM

## 2015-12-05 DIAGNOSIS — Z9181 History of falling: Secondary | ICD-10-CM

## 2015-12-05 NOTE — Therapy (Signed)
[place], [place], [address] Phone: [phone]   Fax:  [phone]  Physical Therapy Treatment  Patient Details  Name: Kerry Flynn MRN: [id number] Date of Birth: 09-06-1927 Referring Provider: Moshe Cipro   Encounter Date: 12/05/2015      PT End of Session - 12/05/15 1821    Visit Number 11   Number of Visits 16   Date for PT Re-Evaluation 12/18/14   Authorization Type [employer] Medicare   Authorization Time Period 10/21/15-12/20/15   Authorization - Visit Number 12   Authorization - Number of Visits 19   PT Start Time [id number]   PT Stop Time 1346   PT Time Calculation (min) 41 min   Activity Tolerance Patient tolerated treatment well   Behavior During Therapy [facility] for tasks assessed/performed      Past Medical History  Diagnosis Date  . Hemorrhoids   . Intermittent vertigo   . Osteoporosis     knees  . Benign recurrent vertigo   . Vitamin D deficiency   . Head trauma     status post fall; upper and lower extremities   . GERD (gastroesophageal reflux disease)   . Diverticulosis   . Chronic constipation   . Dermatitis     recurrent  . Hypertension   . Barrett's esophagus     Dr. Laural Golden, Dr. Moshe Cipro  . Infertility, female   . Peripheral edema     chronic  . Chronic knee pain   . Fatigue     chronic  . Anemia   . Hip fracture, left ([place])     s/p left arthroplasty 2/16  . Osteoarthritis     of the neck  . DDD (degenerative disc disease), lumbar   . DDD (degenerative disc disease), cervical   . Varicose veins   . Hemorrhoid   . Stable angina (HCC)   . Colon polyps   . Abnormality of gait 11/17/2015  . Parkinson disease ([place]) 11/17/2015  . 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 Knee arthroscopy Right 1999  . Knee arthroscopy Left 1996  . Cholecystectomy, laparoscopic  06/17/2006    with stones  . Rotator cuff repair  05/14/2010  . Cataract  extraction w/ intraocular lens implant Left 01/06/2012    left, [facility]  . Eye surgery  01/24/2012    bilateral cataract extraction  . Colonoscopy  04/19/2012    Procedure: COLONOSCOPY;  Surgeon: Rogene Houston, MD;  Location: [facility];  Service: Endoscopy;  Laterality: N/A;  1200  . Tonsillectomy and adenoidectomy  1934  . Palate surgery  1988  . Dental implant   09/2014  . Hip arthroplasty Left 01/31/2015    Procedure: ARTHROPLASTY BIPOLAR HIP;  Surgeon: Carole Civil, MD;  Location: [facility] ORS;  Service: Orthopedics;  Laterality: Left;  . Colonoscopy w/ polypectomy  05/29/1999, 01/12/2012  . Esophagogastroduodenoscopy  01/12/2012  . Upper gi endoscopy  12/12/2006    with biopsy, with colonoscopy - dx: Barrett's disease  . Transthoracic echocardiogram  01/2012    EF=>55%; mild mitral annular calcification & mild MR; trace TR; trace pulm valve regurg  . Nm myocar perf wall motion  02/2012    lexiscan - small, fized apical lateral bowel attenuation artifact; no reversible ischemia; EF 77%; non-diagnostic for ischemia; low risk  . Cardiac catheterization  09/02/2005    mild-mod calcification in mid LAD (20-30% luminal obstruction), otherwise normal coronaries, patent  LE arteries (Dr. Jackie Plum)  . Carotid doppler  02/2008    R & L ICAs 0-49% diameter reduction  . Cholecystectomy    . Tonsillectomy    . Colonoscopy N/A 09/25/2015    Procedure: COLONOSCOPY;  Surgeon: Rogene Houston, MD;  Location: [facility] END[facility] SUITE;  Service: Endoscopy;  Laterality: N/A;  100    There were no vitals filed for this visit.  Visit Diagnosis:  Unsteady gait  Difficulty walking  Decreased functional activity tolerance  Weakness of both legs  Postural kyphosis of thoracic region  Risk for falls  Parkinson's disease ([place])      Subjective Assessment - 12/05/15 1308    Subjective Patient reports that she has stopped taking the sinemet as it was really not allowing her to feel good at all in terms of  dizziness    Currently in Pain? Yes   Pain Score 5    Pain Location Knee   Pain Orientation Right;Left                         [facility] Adult PT Treatment/Exercise - 12/05/15 0001    Knee/Hip Exercises: Standing   Rocker Board Limitations attempted lateral (unable due to knee pain); however able to tolerate AP with B HHA    Gait Training gait with and without metronome at 30-40BPM; patient shows improved gait speed, psture, and mechanics with metronome as compred to without metronome    Other Standing Knee Exercises standing toe taps on 4 inch box    Knee/Hip Exercises: Seated   Other Seated Knee/Hip Exercises forward trunk flexion, lateral trunk flexion, cone rotations, catching ball with focus on core activation with feet not supported    Knee/Hip Exercises: Supine   Other Supine Knee/Hip Exercises supine postrual stretch with no pillows, focus on trunk and cervical extension and shoulder ER x5 mintues today with improved tolerance                 PT Education - 12/05/15 1821    Education provided Yes   Education Details strongly encouraged to speak to MD ASAP about difficulties with parkinsons medication    Person(s) Educated Patient   Methods Explanation   Comprehension Verbalized understanding          PT Short Term Goals - 12/03/15 1848    PT SHORT TERM GOAL #1   Title Pt will be independent with HEP.    Baseline 11/23- needs help from caregiver, is doing HEP on days that she does not have PT    Time 3   Period Weeks   Status Achieved   PT SHORT TERM GOAL #2   Title Improve BLE strength as evidenced by five time sit to stand time of 35 seconds or less with no UE support.    Baseline 11/23- 28 seconds    Time 3   Period Weeks   Status Achieved   PT SHORT TERM GOAL #3   Title Improve functional activity tolerance evidenced by pt ambulating 275 feet or more on 3 minute walk test.    Baseline 11/23- 245ft with walker, no rest breaks    Time 3    Period Weeks   Status On-going   PT SHORT TERM GOAL #4   Title Decrease fall risk per TUG time of 25 seconds or less.    Baseline 11/23- 29 seconds at best with walker    Time 3   Period Weeks   Status  On-going   PT SHORT TERM GOAL #5   Title Patient will demonstrate improved posture at least 75% of the time in order to improve overall mobility, balance, and gait mechanics as well as to reduce fall risk    Time 3   Period Weeks   Status New   Additional Short Term Goals   Additional Short Term Goals Yes   PT SHORT TERM GOAL #6   Title Patient to be regularly imprlementing BIG concepts of speech, movement, and mobility and will also be engaged with local Parkinsons Disease support group in order to promote self-efficacy over disease    Time 4   Period Weeks   Status New           PT Long Term Goals - 12/03/15 1850    PT LONG TERM GOAL #1   Title Pt will be independent with advanced HEP for functional strengthening, improving posture, and improving balance.    Time 6   Period Weeks   Status On-going   PT LONG TERM GOAL #2   Title Improve BLE strength evidenced by five time sit to stand time of 20 seconds or less with no UE support.    Time 6   Period Weeks   Status On-going   PT LONG TERM GOAL #3   Title Improve functional activity tolerance evidenced by pt ambulating 450' during 3 minute walk test.    Time 6   Period Weeks   Status On-going   PT LONG TERM GOAL #4   Title Decrease fall risk per TUG time of 18 seconds or less.    Time 6   Period Weeks   Status On-going   PT LONG TERM GOAL #5   Title Pt will report that she has completed grocery shopping with caregiver, ambulating through grocery store with rollator and <3 rest breaks.    Baseline 11/23- reports that she has been staying in car while caregiver shops, or has been using wheelchair    Time 6   Period Weeks   Status On-going   PT LONG TERM GOAL #6   Title Patient to demonstrate the ability to safely  perform perform floor to stand transfer with min guard and good sequencing in order to allow her to safely recover from any possible falls at home    Time 6   Period Weeks               Plan - 12/05/15 1822    Clinical Impression Statement Continued to focus on general Parkinsons based activities however increased focus on core strength and mobilty today with good tolerance by patient. Continued standing activities as well as supine postural stretch with much improved tolerace by patient. Gait continues to be much improved with metronome, as does posture with mobilty with metronome. Significant fatigue at end of session today.    Pt will benefit from skilled therapeutic intervention in order to improve on the following deficits Decreased activity tolerance;Decreased balance;Abnormal gait;Decreased endurance;Decreased mobility;Decreased strength;Difficulty walking;Postural dysfunction   Rehab Potential Good   PT Frequency 2x / week   PT Duration 4 weeks   PT Treatment/Interventions ADLs/Self Care Home Management;Moist Heat;Gait training;Stair training;Functional mobility training;Therapeutic activities;Therapeutic exercise;Balance training;Neuromuscular re-education;Patient/family education;Manual techniques   PT Next Visit Plan Parkinsons activities with metronome; postural strengthening and stretching; balance training    PT Home Exercise Plan no changes    Consulted and Agree with Plan of Care Patient        Problem List Patient  Active Problem List   Diagnosis Date Noted  . Abnormality of gait 11/17/2015  . Parkinson disease ([place]) 11/17/2015  . 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 Unsteady gait 10/15/2015  . Hearing loss in left ear 10/15/2015  . Osteoarthritis of both knees 08/27/2015  . Tremor 08/27/2015  . Depression 03/25/2015  . Anemia 01/31/2015  . Femoral neck fracture ([place]) 01/31/2015  . Faintness   . Fall at home 04/04/2014  . Multiple thyroid nodules 04/04/2014   . GERD (gastroesophageal reflux disease) 04/04/2014  . Postmenopausal atrophic vaginitis 03/12/2014  . Vitamin D deficiency 03/12/2014  . Osteopetrosis 10/23/2013  . Urinary incontinence, urge 10/23/2013  . Thoracic back pain 10/23/2013  . Lumbar back pain 10/23/2013  . Colon polyps 02/27/2012  . CAD, NATIVE VESSEL 12/12/2009  . Bilateral carotid bruits 12/12/2009  . ALLERGIC URTICARIA 09/09/2009  . Osteoarthrosis, unspecified whether generalized or localized, involving lower leg 04/19/2009  . Essential hypertension 10/16/2008  . FATIGUE 10/16/2008  . HEMORRHOIDS 03/29/2008  . INTERMITTENT VERTIGO 03/29/2008    Deniece Ree PT, DPT [place] [address] [place], [place], [address] Phone: [phone]   Fax:  [phone]  Name: Kerry Flynn MRN: [id number] Date of Birth: [birth year]

## 2015-12-09 ENCOUNTER — Ambulatory Visit (HOSPITAL_COMMUNITY): Payer: Medicare Other | Admitting: Physical Therapy

## 2015-12-09 DIAGNOSIS — R29898 Other symptoms and signs involving the musculoskeletal system: Secondary | ICD-10-CM

## 2015-12-09 DIAGNOSIS — R6889 Other general symptoms and signs: Secondary | ICD-10-CM

## 2015-12-09 DIAGNOSIS — R2681 Unsteadiness on feet: Secondary | ICD-10-CM

## 2015-12-09 DIAGNOSIS — R262 Difficulty in walking, not elsewhere classified: Secondary | ICD-10-CM

## 2015-12-09 DIAGNOSIS — M4004 Postural kyphosis, thoracic region: Secondary | ICD-10-CM

## 2015-12-09 DIAGNOSIS — Z9181 History of falling: Secondary | ICD-10-CM

## 2015-12-09 DIAGNOSIS — G20 Parkinson's disease: Secondary | ICD-10-CM

## 2015-12-09 NOTE — Therapy (Signed)
[place], [place], [address] Phone: [phone]   Fax:  [phone]  Physical Therapy Treatment  Patient Details  Name: Kerry Flynn MRN: [id number] Date of Birth: Sep 23, 1927 Referring Provider: Moshe Cipro   Encounter Date: 12/09/2015      PT End of Session - 12/09/15 1442    Visit Number 12   Number of Visits 16   Date for PT Re-Evaluation 12/18/14   Authorization Type [employer]HC Medicare   Authorization Time Period 10/21/15-12/20/15   Authorization - Visit Number 13   Authorization - Number of Visits 19   PT Start Time 1351   PT Stop Time 1431   PT Time Calculation (min) 40 min   Equipment Utilized During Treatment Gait belt   Activity Tolerance Patient tolerated treatment well   Behavior During Therapy [facility] for tasks assessed/performed      Past Medical History  Diagnosis Date  . Hemorrhoids   . Intermittent vertigo   . Osteoporosis     knees  . Benign recurrent vertigo   . Vitamin D deficiency   . Head trauma     status post fall; upper and lower extremities   . GERD (gastroesophageal reflux disease)   . Diverticulosis   . Chronic constipation   . Dermatitis     recurrent  . Hypertension   . Barrett's esophagus     Dr. Laural Golden, Dr. Moshe Cipro  . Infertility, female   . Peripheral edema     chronic  . Chronic knee pain   . Fatigue     chronic  . Anemia   . Hip fracture, left ([place])     s/p left arthroplasty 2/16  . Osteoarthritis     of the neck  . DDD (degenerative disc disease), lumbar   . DDD (degenerative disc disease), cervical   . Varicose veins   . Hemorrhoid   . Stable angina (HCC)   . Colon polyps   . Abnormality of gait 11/17/2015  . Parkinson disease ([place]) 11/17/2015  . 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 Knee arthroscopy Right 1999  . Knee arthroscopy Left 1996  . Cholecystectomy, laparoscopic  06/17/2006    with stones   . Rotator cuff repair  05/14/2010  . Cataract extraction w/ intraocular lens implant Left 01/06/2012    left, [facility]  . Eye surgery  01/24/2012    bilateral cataract extraction  . Colonoscopy  04/19/2012    Procedure: COLONOSCOPY;  Surgeon: Rogene Houston, MD;  [facility];  Service: Endoscopy;  Laterality: N/A;  1200  . Tonsillectomy and adenoidectomy  1934  . Palate surgery  1988  . Dental implant   09/2014  . Hip arthroplasty Left 01/31/2015    Procedure: ARTHROPLASTY BIPOLAR HIP;  Surgeon: Carole Civil, MD;  Location: [facility] ORS;  Service: Orthopedics;  Laterality: Left;  . Colonoscopy w/ polypectomy  05/29/1999, 01/12/2012  . Esophagogastroduodenoscopy  01/12/2012  . Upper gi endoscopy  12/12/2006    with biopsy, with colonoscopy - dx: Barrett's disease  . Transthoracic echocardiogram  01/2012    EF=>55%; mild mitral annular calcification & mild MR; trace TR; trace pulm valve regurg  . Nm myocar perf wall motion  02/2012    lexiscan - small, fized apical lateral bowel attenuation artifact; no reversible ischemia; EF 77%; non-diagnostic for ischemia; low risk  . Cardiac catheterization  09/02/2005    mild-mod calcification in mid  LAD (20-30% luminal obstruction), otherwise normal coronaries, patent LE arteries (Dr. Jackie Plum)  . Carotid doppler  02/2008    R & L ICAs 0-49% diameter reduction  . Cholecystectomy    . Tonsillectomy    . Colonoscopy N/A 09/25/2015    Procedure: COLONOSCOPY;  Surgeon: Rogene Houston, MD;  Location: [facility];  Service: Endoscopy;  Laterality: N/A;  100    There were no vitals filed for this visit.  Visit Diagnosis:  Unsteady gait  Difficulty walking  Decreased functional activity tolerance  Weakness of both legs  Postural kyphosis of thoracic region  Risk for falls  Parkinson's disease [facility])      Subjective Assessment - 12/09/15 1437    Subjective Patient reports she is doing well today, just a bit achey especially in her  knees    Pertinent History Partial hip replacement 01/31/15 on LLE.    Currently in Pain? Yes   Pain Score 5    Pain Location Knee   Pain Orientation Left;Right                         [facility] Adult PT Treatment/Exercise - 12/09/15 0001    Knee/Hip Exercises: Standing   Gait Training gait with quad and single point canes x73ft, 30ft, 70ft, focus on improved posture and heel-toe gait    Knee/Hip Exercises: Seated   Other Seated Knee/Hip Exercises on air pad: lateral trunk flexion, mini ab crunches with forward reach to toes   Sit to Sand 10 reps;with UE support;Other (comment)  mat table as low as possible    Knee/Hip Exercises: Supine   Other Supine Knee/Hip Exercises supine postrual stretch with no pillows, focus on trunk and cervical extension and shoulder ER x3 mintues today with improved tolerance  and weights on knees                 PT Education - 12/09/15 1442    Education provided Yes   Education Details continue to encourage patient to speak with MD about medication difficulties    Person(s) Educated Patient   Methods Explanation   Comprehension Verbalized understanding          PT Short Term Goals - 12/03/15 1848    PT SHORT TERM GOAL #1   Title Pt will be independent with HEP.    Baseline 11/23- needs help from caregiver, is doing HEP on days that she does not have PT    Time 3   Period Weeks   Status Achieved   PT SHORT TERM GOAL #2   Title Improve BLE strength as evidenced by five time sit to stand time of 35 seconds or less with no UE support.    Baseline 11/23- 28 seconds    Time 3   Period Weeks   Status Achieved   PT SHORT TERM GOAL #3   Title Improve functional activity tolerance evidenced by pt ambulating 275 feet or more on 3 minute walk test.    Baseline 11/23- 252ft with walker, no rest breaks    Time 3   Period Weeks   Status On-going   PT SHORT TERM GOAL #4   Title Decrease fall risk per TUG time of 25 seconds or less.     Baseline 11/23- 29 seconds at best with walker    Time 3   Period Weeks   Status On-going   PT SHORT TERM GOAL #5   Title Patient will demonstrate improved  posture at least 75% of the time in order to improve overall mobility, balance, and gait mechanics as well as to reduce fall risk    Time 3   Period Weeks   Status New   Additional Short Term Goals   Additional Short Term Goals Yes   PT SHORT TERM GOAL #6   Title Patient to be regularly imprlementing BIG concepts of speech, movement, and mobility and will also be engaged with local Parkinsons Disease support group in order to promote self-efficacy over disease    Time 4   Period Weeks   Status New           PT Long Term Goals - 12/03/15 1850    PT LONG TERM GOAL #1   Title Pt will be independent with advanced HEP for functional strengthening, improving posture, and improving balance.    Time 6   Period Weeks   Status On-going   PT LONG TERM GOAL #2   Title Improve BLE strength evidenced by five time sit to stand time of 20 seconds or less with no UE support.    Time 6   Period Weeks   Status On-going   PT LONG TERM GOAL #3   Title Improve functional activity tolerance evidenced by pt ambulating 450' during 3 minute walk test.    Time 6   Period Weeks   Status On-going   PT LONG TERM GOAL #4   Title Decrease fall risk per TUG time of 18 seconds or less.    Time 6   Period Weeks   Status On-going   PT LONG TERM GOAL #5   Title Pt will report that she has completed grocery shopping with caregiver, ambulating through grocery store with rollator and <3 rest breaks.    Baseline 11/23- reports that she has been staying in car while caregiver shops, or has been using wheelchair    Time 6   Period Weeks   Status On-going   PT LONG TERM GOAL #6   Title Patient to demonstrate the ability to safely perform perform floor to stand transfer with min guard and good sequencing in order to allow her to safely recover from any  possible falls at home    Time 6   Period Weeks               Plan - 12/09/15 1442    Clinical Impression Statement Began session with supine postural stretching and then proceeded with seated core exercise/mobility on air pad on mat table with some difficulty in maintaining good posture  but overall good peorformance by patient although she did desmonstrate some muscle fatigue and required cues for good posture and form. Worked on sit to stands from low surface with some posterior LOB noted and Min guard to assist in correcting.Otherwise focused on gait with quad cane and single point cane today with focus on improved gait pattern and opsture with new device. Patient fatigued quickly and didd require multiple cues for posture and gait but very motivated to continue with challenging activiteis and progressing gait with cane.    Pt will benefit from skilled therapeutic intervention in order to improve on the following deficits Decreased activity tolerance;Decreased balance;Abnormal gait;Decreased endurance;Decreased mobility;Decreased strength;Difficulty walking;Postural dysfunction   Rehab Potential Good   PT Frequency 2x / week   PT Duration 4 weeks   PT Treatment/Interventions ADLs/Self Care Home Management;Moist Heat;Gait training;Stair training;Functional mobility training;Therapeutic activities;Therapeutic exercise;Balance training;Neuromuscular re-education;Patient/family education;Manual techniques   PT Next Visit  Plan supine postural stretch; trial prone postural stretch if time. Core strength and mobility on air pad at edge of mat table. Introduce balance training. Continue gait with single point cane, no metronome for now until pattern/mechanics are improved.    PT Home Exercise Plan no changes    Consulted and Agree with Plan of Care Patient        Problem List Patient Active Problem List   Diagnosis Date Noted  . Abnormality of gait 11/17/2015  . Parkinson disease ([place])  11/17/2015  . 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 Unsteady gait 10/15/2015  . Hearing loss in left ear 10/15/2015  . Osteoarthritis of both knees 08/27/2015  . Tremor 08/27/2015  . Depression 03/25/2015  . Anemia 01/31/2015  . Femoral neck fracture ([place]) 01/31/2015  . Faintness   . Fall at home 04/04/2014  . Multiple thyroid nodules 04/04/2014  . GERD (gastroesophageal reflux disease) 04/04/2014  . Postmenopausal atrophic vaginitis 03/12/2014  . Vitamin D deficiency 03/12/2014  . Osteopetrosis 10/23/2013  . Urinary incontinence, urge 10/23/2013  . Thoracic back pain 10/23/2013  . Lumbar back pain 10/23/2013  . Colon polyps 02/27/2012  . CAD, NATIVE VESSEL 12/12/2009  . Bilateral carotid bruits 12/12/2009  . ALLERGIC URTICARIA 09/09/2009  . Osteoarthrosis, unspecified whether generalized or localized, involving lower leg 04/19/2009  . Essential hypertension 10/16/2008  . FATIGUE 10/16/2008  . HEMORRHOIDS 03/29/2008  . INTERMITTENT VERTIGO 03/29/2008    Deniece Ree PT, DPT [place] [address] [place], [place], [address] Phone: [phone]   Fax:  [phone]  Name: Kerry Flynn MRN: [id number] Date of Birth: 1927-02-26

## 2015-12-11 ENCOUNTER — Ambulatory Visit (HOSPITAL_COMMUNITY): Payer: Medicare Other | Admitting: Physical Therapy

## 2015-12-11 DIAGNOSIS — M4004 Postural kyphosis, thoracic region: Secondary | ICD-10-CM

## 2015-12-11 DIAGNOSIS — Z9181 History of falling: Secondary | ICD-10-CM

## 2015-12-11 DIAGNOSIS — R6889 Other general symptoms and signs: Secondary | ICD-10-CM

## 2015-12-11 DIAGNOSIS — R29898 Other symptoms and signs involving the musculoskeletal system: Secondary | ICD-10-CM

## 2015-12-11 DIAGNOSIS — R2681 Unsteadiness on feet: Secondary | ICD-10-CM | POA: Diagnosis not present

## 2015-12-11 DIAGNOSIS — R262 Difficulty in walking, not elsewhere classified: Secondary | ICD-10-CM

## 2015-12-11 DIAGNOSIS — G20 Parkinson's disease: Secondary | ICD-10-CM

## 2015-12-11 NOTE — Therapy (Signed)
[place], [place], [address] Phone: [phone]   Fax:  [phone]  Physical Therapy Treatment  Patient Details  Name: Kerry Flynn MRN: [id number] Date of Birth: [birth year] Referring Provider: Moshe Cipro   Encounter Date: 12/11/2015      PT End of Session - 12/11/15 1518    Visit Number 13   Number of Visits 16   Date for PT Re-Evaluation 12/18/14   Authorization Type UHC Medicare   Authorization Time Period 10/21/15-12/20/15   Authorization - Visit Number 13   Authorization - Number of Visits 19   PT Start Time [id number]   PT Stop Time 1350   PT Time Calculation (min) 47 min   Equipment Utilized During Treatment Gait belt   Activity Tolerance Patient tolerated treatment well   Behavior During Therapy [facility] for tasks assessed/performed      Past Medical History  Diagnosis Date  . Hemorrhoids   . Intermittent vertigo   . Osteoporosis     knees  . Benign recurrent vertigo   . Vitamin D deficiency   . Head trauma     status post fall; upper and lower extremities   . GERD (gastroesophageal reflux disease)   . Diverticulosis   . Chronic constipation   . Dermatitis     recurrent  . Hypertension   . Barrett's esophagus     Dr. Laural Golden, Dr. Moshe Cipro  . Infertility, female   . Peripheral edema     chronic  . Chronic knee pain   . Fatigue     chronic  . Anemia   . Hip fracture, left ([place])     s/p left arthroplasty 2/16  . Osteoarthritis     of the neck  . DDD (degenerative disc disease), lumbar   . DDD (degenerative disc disease), cervical   . Varicose veins   . Hemorrhoid   . Stable angina (HCC)   . Colon polyps   . Abnormality of gait 11/17/2015  . Parkinson disease ([place]) 11/17/2015  . 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 Knee arthroscopy Right 1999  . Knee arthroscopy Left 1996  . Cholecystectomy, laparoscopic  06/17/2006    with stones   . Rotator cuff repair  05/14/2010  . Cataract extraction w/ intraocular lens implant Left 01/06/2012    left, [facility]  . Eye surgery  01/24/2012    bilateral cataract extraction  . Colonoscopy  04/19/2012    Procedure: COLONOSCOPY;  Surgeon: Rogene Houston, MD;  Location: [facility];  Service: Endoscopy;  Laterality: N/A;  1200  . Tonsillectomy and adenoidectomy  1934  . Palate surgery  1988  . Dental implant   09/2014  . Hip arthroplasty Left 01/31/2015    Procedure: ARTHROPLASTY BIPOLAR HIP;  Surgeon: Carole Civil, MD;  Location: [facility] ORS;  Service: Orthopedics;  Laterality: Left;  . Colonoscopy w/ polypectomy  05/29/1999, 01/12/2012  . Esophagogastroduodenoscopy  01/12/2012  . Upper gi endoscopy  12/12/2006    with biopsy, with colonoscopy - dx: Barrett's disease  . Transthoracic echocardiogram  01/2012    EF=>55%; mild mitral annular calcification & mild MR; trace TR; trace pulm valve regurg  . Nm myocar perf wall motion  02/2012    lexiscan - small, fized apical lateral bowel attenuation artifact; no reversible ischemia; EF 77%; non-diagnostic for ischemia; low risk  . Cardiac catheterization  09/02/2005    mild-mod calcification in mid  LAD (20-30% luminal obstruction), otherwise normal coronaries, patent LE arteries (Dr. Jackie Plum)  . Carotid doppler  02/2008    R & L ICAs 0-49% diameter reduction  . Cholecystectomy    . Tonsillectomy    . Colonoscopy N/A 09/25/2015    Procedure: COLONOSCOPY;  Surgeon: Rogene Houston, MD;  Location: [facility];  Service: Endoscopy;  Laterality: N/A;  100    There were no vitals filed for this visit.  Visit Diagnosis:  Unsteady gait  Difficulty walking  Decreased functional activity tolerance  Weakness of both legs  Postural kyphosis of thoracic region  Risk for falls  Parkinson's disease [facility])      Subjective Assessment - 12/11/15 1522    Subjective PT states she is working hard on keeping her head up when she walks.  Reports  no pain today.   Currently in Pain? No/denies                         [facility] PT Treatment/Exercise - 12/11/15 1512    Knee/Hip Exercises: Standing   Gait Training gait with personal quadcane  x39ft, 19ft, 45ft, focus on improved posture and heel-toe gait    Knee/Hip Exercises: Seated   Other Seated Knee/Hip Exercises on air pad: lateral trunk flexion, LAQ,hip flexion, UE abd/flex all 10 reps each   Sit to Sand with UE support;Other (comment);5 reps   Knee/Hip Exercises: Supine   Other Supine Knee/Hip Exercises supine postrual stretch with no pillows, focus on trunk and cervical extension and shoulder ER x3 mintues today with improved tolerance  and weights on knees                   PT Short Term Goals - 12/03/15 1848    PT SHORT TERM GOAL #1   Title Pt will be independent with HEP.    Baseline 11/23- needs help from caregiver, is doing HEP on days that she does not have PT    Time 3   Period Weeks   Status Achieved   PT SHORT TERM GOAL #2   Title Improve BLE strength as evidenced by five time sit to stand time of 35 seconds or less with no UE support.    Baseline 11/23- 28 seconds    Time 3   Period Weeks   Status Achieved   PT SHORT TERM GOAL #3   Title Improve functional activity tolerance evidenced by pt ambulating 275 feet or more on 3 minute walk test.    Baseline 11/23- 280ft with walker, no rest breaks    Time 3   Period Weeks   Status On-going   PT SHORT TERM GOAL #4   Title Decrease fall risk per TUG time of 25 seconds or less.    Baseline 11/23- 29 seconds at best with walker    Time 3   Period Weeks   Status On-going   PT SHORT TERM GOAL #5   Title Patient will demonstrate improved posture at least 75% of the time in order to improve overall mobility, balance, and gait mechanics as well as to reduce fall risk    Time 3   Period Weeks   Status New   Additional Short Term Goals   Additional Short Term Goals Yes   PT SHORT TERM  GOAL #6   Title Patient to be regularly imprlementing BIG concepts of speech, movement, and mobility and will also be engaged with local Parkinsons Disease support  group in order to promote self-efficacy over disease    Time 4   Period Weeks   Status New           PT Long Term Goals - 12/03/15 1850    PT LONG TERM GOAL #1   Title Pt will be independent with advanced HEP for functional strengthening, improving posture, and improving balance.    Time 6   Period Weeks   Status On-going   PT LONG TERM GOAL #2   Title Improve BLE strength evidenced by five time sit to stand time of 20 seconds or less with no UE support.    Time 6   Period Weeks   Status On-going   PT LONG TERM GOAL #3   Title Improve functional activity tolerance evidenced by pt ambulating 450' during 3 minute walk test.    Time 6   Period Weeks   Status On-going   PT LONG TERM GOAL #4   Title Decrease fall risk per TUG time of 18 seconds or less.    Time 6   Period Weeks   Status On-going   PT LONG TERM GOAL #5   Title Pt will report that she has completed grocery shopping with caregiver, ambulating through grocery store with rollator and <3 rest breaks.    Baseline 11/23- reports that she has been staying in car while caregiver shops, or has been using wheelchair    Time 6   Period Weeks   Status On-going   PT LONG TERM GOAL #6   Title Patient to demonstrate the ability to safely perform perform floor to stand transfer with min guard and good sequencing in order to allow her to safely recover from any possible falls at home    Time 6   Period Weeks               Plan - 12/11/15 1519    Clinical Impression Statement Began session with supine postural stretch.  Most rigid in cervical region taking a good minute for these muscles to relax and extend backward onto the mat.   Pt able to complete additional actvities in seated position with fatigue toward last couple reps noted throwing posture back into  extension.  Pt able to complete 10 additional feet with 2 bouts of ambulation today. Pt brought personal quad cane to work with today.    Rehab Potential Good   PT Frequency 2x / week   PT Duration 4 weeks   PT Treatment/Interventions ADLs/Self Care Home Management;Moist Heat;Gait training;Stair training;Functional mobility training;Therapeutic activities;Therapeutic exercise;Balance training;Neuromuscular re-education;Patient/family education;Manual techniques   PT Next Visit Plan supine postural stretch; trial prone postural stretch if time. Core strength and mobility on air pad at edge of mat table. Introduce balance training. Continue gait with single point cane, no metronome for now until pattern/mechanics are improved.    Consulted and Agree with Plan of Care Patient        Problem List Patient Active Problem List   Diagnosis Date Noted  . Abnormality of gait 11/17/2015  . Parkinson disease ([place]) 11/17/2015  . 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 Unsteady gait 10/15/2015  . Hearing loss in left ear 10/15/2015  . Osteoarthritis of both knees 08/27/2015  . Tremor 08/27/2015  . Depression 03/25/2015  . Anemia 01/31/2015  . Femoral neck fracture ([place]) 01/31/2015  . Faintness   . Fall at home 04/04/2014  . Multiple thyroid nodules 04/04/2014  . GERD (gastroesophageal reflux disease) 04/04/2014  . Postmenopausal  atrophic vaginitis 03/12/2014  . Vitamin D deficiency 03/12/2014  . Osteopetrosis 10/23/2013  . Urinary incontinence, urge 10/23/2013  . Thoracic back pain 10/23/2013  . Lumbar back pain 10/23/2013  . Colon polyps 02/27/2012  . CAD, NATIVE VESSEL 12/12/2009  . Bilateral carotid bruits 12/12/2009  . ALLERGIC URTICARIA 09/09/2009  . Osteoarthrosis, unspecified whether generalized or localized, involving lower leg 04/19/2009  . Essential hypertension 10/16/2008  . FATIGUE 10/16/2008  . HEMORRHOIDS 03/29/2008  . INTERMITTENT VERTIGO 03/29/2008    Teena Irani,  PTA/CLT [phone]  12/11/2015, 3:23 PM  [place] [address] [place], [place], [address] Phone: [phone]   Fax:  [phone]  Name: ZARAYAH PASEK MRN: [id number] Date of Birth: Jul 06, 1927

## 2015-12-15 ENCOUNTER — Telehealth: Payer: Self-pay | Admitting: Neurology

## 2015-12-15 NOTE — Telephone Encounter (Signed)
Pt called sts the carbidopa-levodopa (SINEMET IR) 25-100 MG tablet made symptoms worse. Balance, dizziness increased when she took med 3x day. She stopped taking medication at least a week ago. Since then tremor in hand is about the same, muscle spasm in legs are about the same. She said she feels better when off that medication. Dizziness and balance is better without the med.

## 2015-12-15 NOTE — Telephone Encounter (Signed)
I called the patient. The patient apparently could not tolerate the Sinemet, stop the medication he education calls dizziness. We will follow-up for the next revisit, if the patient appears to be developing true Parkinson's disease, we may have to retry this medication , possibly with small fragments of tablets.

## 2015-12-16 ENCOUNTER — Ambulatory Visit (HOSPITAL_COMMUNITY): Payer: Medicare Other | Admitting: Physical Therapy

## 2015-12-16 DIAGNOSIS — R2681 Unsteadiness on feet: Secondary | ICD-10-CM

## 2015-12-16 DIAGNOSIS — R29898 Other symptoms and signs involving the musculoskeletal system: Secondary | ICD-10-CM

## 2015-12-16 DIAGNOSIS — R6889 Other general symptoms and signs: Secondary | ICD-10-CM

## 2015-12-16 DIAGNOSIS — R262 Difficulty in walking, not elsewhere classified: Secondary | ICD-10-CM

## 2015-12-16 DIAGNOSIS — M4004 Postural kyphosis, thoracic region: Secondary | ICD-10-CM

## 2015-12-16 DIAGNOSIS — Z9181 History of falling: Secondary | ICD-10-CM

## 2015-12-16 DIAGNOSIS — G20 Parkinson's disease: Secondary | ICD-10-CM

## 2015-12-16 NOTE — Therapy (Signed)
[place], [place], [address] Phone: [phone]   Fax:  [phone]  Physical Therapy Treatment (Re-Assessment)  Patient Details  Name: Kerry Flynn MRN: [id number] Date of Birth: [birth year] Referring Provider: Moshe Cipro   Encounter Date: 12/16/2015      PT End of Session - 12/16/15 1438    Visit Number 14   Number of Visits 22   Date for PT Re-Evaluation 01/13/16   Authorization Type UHC Medicare (G-code done 14th visit)   Authorization Time Period 73/53/29-92/42/68; recert done 34/19   Authorization - Visit Number 55   Authorization - Number of Visits 24   PT Start Time 6222   PT Stop Time 1430   PT Time Calculation (min) 43 min   Activity Tolerance Patient tolerated treatment well   Behavior During Therapy [facility] for tasks assessed/performed      Past Medical History  Diagnosis Date  . Hemorrhoids   . Intermittent vertigo   . Osteoporosis     knees  . Benign recurrent vertigo   . Vitamin D deficiency   . Head trauma     status post fall; upper and lower extremities   . GERD (gastroesophageal reflux disease)   . Diverticulosis   . Chronic constipation   . Dermatitis     recurrent  . Hypertension   . Barrett's esophagus     Dr. Laural Golden, Dr. Moshe Cipro  . Infertility, female   . Peripheral edema     chronic  . Chronic knee pain   . Fatigue     chronic  . Anemia   . Hip fracture, left ([place])     s/p left arthroplasty 2/16  . Osteoarthritis     of the neck  . DDD (degenerative disc disease), lumbar   . DDD (degenerative disc disease), cervical   . Varicose veins   . Hemorrhoid   . Stable angina (HCC)   . Colon polyps   . Abnormality of gait 11/17/2015  . Parkinson disease ([place]) 11/17/2015  . 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 Knee arthroscopy Right 1999  . Knee arthroscopy Left 1996  . Cholecystectomy, laparoscopic  06/17/2006     with stones  . Rotator cuff repair  05/14/2010  . Cataract extraction w/ intraocular lens implant Left 01/06/2012    left, [facility]  . Eye surgery  01/24/2012    bilateral cataract extraction  . Colonoscopy  04/19/2012    Procedure: COLONOSCOPY;  Surgeon: Rogene Houston, MD;  Location: [facility];  Service: Endoscopy;  Laterality: N/A;  1200  . Tonsillectomy and adenoidectomy  1934  . Palate surgery  1988  . Dental implant   09/2014  . Hip arthroplasty Left 01/31/2015    Procedure: ARTHROPLASTY BIPOLAR HIP;  Surgeon: Carole Civil, MD;  Location: [facility] ORS;  Service: Orthopedics;  Laterality: Left;  . Colonoscopy w/ polypectomy  05/29/1999, 01/12/2012  . Esophagogastroduodenoscopy  01/12/2012  . Upper gi endoscopy  12/12/2006    with biopsy, with colonoscopy - dx: Barrett's disease  . Transthoracic echocardiogram  01/2012    EF=>55%; mild mitral annular calcification & mild MR; trace TR; trace pulm valve regurg  . Nm myocar perf wall motion  02/2012    lexiscan - small, fized apical lateral bowel attenuation artifact; no reversible ischemia; EF 77%; non-diagnostic for ischemia; low risk  . Cardiac catheterization  09/02/2005    mild-mod calcification in mid  LAD (20-30% luminal obstruction), otherwise normal coronaries, patent LE arteries (Dr. Jackie Plum)  . Carotid doppler  02/2008    R & L ICAs 0-49% diameter reduction  . Cholecystectomy    . Tonsillectomy    . Colonoscopy N/A 09/25/2015    Procedure: COLONOSCOPY;  Surgeon: Rogene Houston, MD;  Location[facility];  Service: Endoscopy;  Laterality: N/A;  100    There were no vitals filed for this visit.  Visit Diagnosis:  Unsteady gait  Difficulty walking  Decreased functional activity tolerance  Weakness of both legs  Postural kyphosis of thoracic region  Risk for falls  Parkinson's disease [facility])      Subjective Assessment - 12/16/15 1349    Subjective Patient reports that her knees are continuing to bother her; she  did get in touch with her doctor about her medicine who told her to hold off of medicine for now since she was not responding well to it. Patient does report that she is concerned about her posture.    Pertinent History Partial hip replacement 01/31/15 on LLE.    How long can you stand comfortably? 12/20- 30-60 minutes    How long can you walk comfortably? 12/20- gait is limited by knee pain and swelling but she has been trying to walk more    Patient Stated Goals Be able to walk long enough to go grocery shopping   Currently in Pain? Yes   Pain Score 3    Pain Location Knee   Pain Orientation Right;Left            [facility] PT Assessment - 12/16/15 0001    Strength   Right Hip Flexion 4/5   Right Hip Extension 2+/5   Right Hip ABduction 3/5   Left Hip Flexion 4-/5   Left Hip Extension 2/5   Left Hip ABduction 2/5   Right Knee Flexion 4/5   Right Knee Extension 4+/5   Left Knee Flexion 4-/5   Left Knee Extension 5/5   Right Ankle Dorsiflexion 5/5   Left Ankle Dorsiflexion 5/5   Transfers   Five time sit to stand comments  27 seconds with UE support    6 minute walk test results    Aerobic Endurance Distance Walked 200   Endurance additional comments 3MWT with walker; gait speed 0.37 m/s    High Level Balance   High Level Balance Comments TUG 30.6, 33, 33                             PT Education - 12/16/15 1438    Education provided Yes   Education Details progress with skilled PT services, plan of care moving forward    Person(s) Educated Patient   Methods Explanation   Comprehension Verbalized understanding          PT Short Term Goals - 12/16/15 1415    PT SHORT TERM GOAL #1   Title Pt will be independent with HEP.    Baseline 12/20- still doing it on days she is not here but will not do it sometimes if they are out and walking a lot    Time 3   Period Weeks   Status On-going   PT SHORT TERM GOAL #2   Title Improve BLE strength as evidenced  by five time sit to stand time of 35 seconds or less with no UE support.    Time 3   Period Weeks  Status Achieved   PT SHORT TERM GOAL #3   Title Improve functional activity tolerance evidenced by pt ambulating 275 feet or more on 3 minute walk test.    Baseline 12/20- 379f in 3 minutes    Time 3   Period Weeks   Status On-going   PT SHORT TERM GOAL #4   Title Decrease fall risk per TUG time of 25 seconds or less.    Baseline 12/20- 30.6 at best with walker    Time 3   Period Weeks   Status On-going   PT SHORT TERM GOAL #5   Title Patient will demonstrate improved posture at least 75% of the time in order to improve overall mobility, balance, and gait mechanics as well as to reduce fall risk    Baseline 12/20- patient more aware but still working on it    Time 3   Period Weeks   Status On-going   PT SHORT TERM GOAL #6   Title Patient to be regularly imprlementing BIG concepts of speech, movement, and mobility and will also be engaged with local Parkinsons Disease support group in order to promote self-efficacy over disease    Baseline 12/20- has been implementing BIG movements at home, gave info about support group    Time 4   Status Partially Met           PT Long Term Goals - 12/16/15 1419    PT LONG TERM GOAL #1   Title Pt will be independent with advanced HEP for functional strengthening, improving posture, and improving balance.    Time 6   Period Weeks   Status On-going   PT LONG TERM GOAL #2   Title Improve BLE strength evidenced by five time sit to stand time of 20 seconds or less with no UE support.    Time 6   Period Weeks   Status On-going   PT LONG TERM GOAL #3   Title Improve functional activity tolerance evidenced by pt ambulating 450' during 3 minute walk test.    Time 6   Period Weeks   Status On-going   PT LONG TERM GOAL #4   Title Decrease fall risk per TUG time of 18 seconds or less.    Time 6   Period Weeks   Status On-going   PT LONG TERM  GOAL #5   Title Pt will report that she has completed grocery shopping with caregiver, ambulating through grocery store with rollator and <3 rest breaks.    Baseline 12/20- still working on this but has been going on more trips    Time 6   Period Weeks   Status On-going   PT LONG TERM GOAL #6   Time 6   Period Weeks   Status On-going               Plan - 12/16/15 1439    Clinical Impression Statement Re-assessment performed today. Patient does not show significant improvement in functional tests or strength measures at this time, however does show high motivation to continue forwards especially due to recent diagnosis of Parkinsons today and does have concern over her strength and other functional tasks such as floor to stand  as well as balance.  Patient's most major impairments at this time continue to be functional postural skills, gait, strength, balance, and functional task performance such as floor to stand. At this time, in part due to recent diagnosis of and order for PT for parkinsons disease, recommend continuing  onwards with skilled PT services in order to address functional impairemnts and assist patient in reaching an optimal level of function with minimal fall risk.    Pt will benefit from skilled therapeutic intervention in order to improve on the following deficits Decreased activity tolerance;Decreased balance;Abnormal gait;Decreased endurance;Decreased mobility;Decreased strength;Difficulty walking;Postural dysfunction   Rehab Potential Good   PT Frequency 2x / week   PT Duration 4 weeks   PT Treatment/Interventions ADLs/Self Care Home Management;Moist Heat;Gait training;Stair training;Functional mobility training;Therapeutic activities;Therapeutic exercise;Balance training;Neuromuscular re-education;Patient/family education;Manual techniques   PT Next Visit Plan supine and prone postural stretch; postural training; gait with single point cane; functional strengthening.     PT Home Exercise Plan no changes    Consulted and Agree with Plan of Care Patient          G-Codes - December 21, 2015 1455    Functional Assessment Tool Used Based on skilled clinical assessment of strength, balance, posture, gait, overall fall risk    Functional Limitation Mobility: Walking and moving around   Mobility: Walking and Moving Around Current Status [phone]) At least 60 percent but less than 80 percent impaired, limited or restricted   Mobility: Walking and Moving Around Goal Status [phone]) At least 40 percent but less than 60 percent impaired, limited or restricted      Problem List Patient Active Problem List   Diagnosis Date Noted  . Abnormality of gait 11/17/2015  . Parkinson disease ([place]) 11/17/2015  . 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 Unsteady gait 10/15/2015  . Hearing loss in left ear 10/15/2015  . Osteoarthritis of both knees 08/27/2015  . Tremor 08/27/2015  . Depression 03/25/2015  . Anemia 01/31/2015  . Femoral neck fracture ([place]) 01/31/2015  . Faintness   . Fall at home 04/04/2014  . Multiple thyroid nodules 04/04/2014  . GERD (gastroesophageal reflux disease) 04/04/2014  . Postmenopausal atrophic vaginitis 03/12/2014  . Vitamin D deficiency 03/12/2014  . Osteopetrosis 10/23/2013  . Urinary incontinence, urge 10/23/2013  . Thoracic back pain 10/23/2013  . Lumbar back pain 10/23/2013  . Colon polyps 02/27/2012  . CAD, NATIVE VESSEL 12/12/2009  . Bilateral carotid bruits 12/12/2009  . ALLERGIC URTICARIA 09/09/2009  . Osteoarthrosis, unspecified whether generalized or localized, involving lower leg 04/19/2009  . Essential hypertension 10/16/2008  . FATIGUE 10/16/2008  . HEMORRHOIDS 03/29/2008  . INTERMITTENT VERTIGO 03/29/2008    Physical Therapy Progress Note  Dates of Reporting Period: 11/19/15 to 2015-12-21  Objective Reports of Subjective Statement: see above   Objective Measurements: see above   Goal Update: see above   Plan: see above    Reason Skilled Services are Required: continue to address postural, gait, balance, strength, and functional mobility impairments, reduce fall risk     Deniece Ree PT, DPT [place], [place], [address] Phone: [phone]   Fax:  [phone]  Name: Kerry Flynn MRN: [id number] Date of Birth: 01-04-1927

## 2015-12-18 ENCOUNTER — Ambulatory Visit (HOSPITAL_COMMUNITY): Payer: Medicare Other | Admitting: Physical Therapy

## 2015-12-18 DIAGNOSIS — G20 Parkinson's disease: Secondary | ICD-10-CM

## 2015-12-18 DIAGNOSIS — Z9181 History of falling: Secondary | ICD-10-CM

## 2015-12-18 DIAGNOSIS — R2681 Unsteadiness on feet: Secondary | ICD-10-CM | POA: Diagnosis not present

## 2015-12-18 DIAGNOSIS — R6889 Other general symptoms and signs: Secondary | ICD-10-CM

## 2015-12-18 DIAGNOSIS — M4004 Postural kyphosis, thoracic region: Secondary | ICD-10-CM

## 2015-12-18 DIAGNOSIS — R262 Difficulty in walking, not elsewhere classified: Secondary | ICD-10-CM

## 2015-12-18 DIAGNOSIS — R29898 Other symptoms and signs involving the musculoskeletal system: Secondary | ICD-10-CM
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
## 2015-12-23 ENCOUNTER — Ambulatory Visit (HOSPITAL_COMMUNITY): Payer: Medicare Other

## 2015-12-25 ENCOUNTER — Ambulatory Visit (HOSPITAL_COMMUNITY): Payer: Medicare Other

## 2015-12-25 DIAGNOSIS — R262 Difficulty in walking, not elsewhere classified: Secondary | ICD-10-CM

## 2015-12-25 DIAGNOSIS — Z9181 History of falling: Secondary | ICD-10-CM

## 2015-12-25 DIAGNOSIS — R2681 Unsteadiness on feet: Secondary | ICD-10-CM | POA: Diagnosis not present

## 2015-12-25 DIAGNOSIS — R6889 Other general symptoms and signs: Secondary | ICD-10-CM

## 2015-12-25 DIAGNOSIS — M4004 Postural kyphosis, thoracic region: Secondary | ICD-10-CM

## 2015-12-25 DIAGNOSIS — R29898 Other symptoms and signs involving the musculoskeletal system: Secondary | ICD-10-CM

## 2015-12-25 DIAGNOSIS — G20 Parkinson's disease: Secondary | ICD-10-CM
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
## 2015-12-26 ENCOUNTER — Ambulatory Visit: Payer: Medicare Other | Admitting: "Endocrinology

## 2015-12-29 ENCOUNTER — Other Ambulatory Visit: Payer: Self-pay | Admitting: Family Medicine

## 2016-01-01 ENCOUNTER — Ambulatory Visit (HOSPITAL_COMMUNITY): Payer: Medicare HMO | Attending: Family Medicine

## 2016-01-01 DIAGNOSIS — R29898 Other symptoms and signs involving the musculoskeletal system: Secondary | ICD-10-CM | POA: Insufficient documentation

## 2016-01-01 DIAGNOSIS — M4004 Postural kyphosis, thoracic region: Secondary | ICD-10-CM

## 2016-01-01 DIAGNOSIS — G20 Parkinson's disease: Secondary | ICD-10-CM

## 2016-01-01 DIAGNOSIS — R6889 Other general symptoms and signs: Secondary | ICD-10-CM | POA: Insufficient documentation

## 2016-01-01 DIAGNOSIS — R2681 Unsteadiness on feet: Secondary | ICD-10-CM | POA: Insufficient documentation

## 2016-01-01 DIAGNOSIS — R262 Difficulty in walking, not elsewhere classified: Secondary | ICD-10-CM | POA: Diagnosis not present

## 2016-01-01 DIAGNOSIS — Z9181 History of falling: Secondary | ICD-10-CM | POA: Diagnosis not present

## 2016-01-01 NOTE — Therapy (Signed)
[facility] [address] [place], [place], [address] Phone: [phone]   Fax:  [phone]  Physical Therapy Treatment  Patient Details  Name: Kerry Flynn MRN: [id number] Date of Birth: 1927-02-11 Referring Provider: Lodema Hong   Encounter Date: 01/01/2016      PT End of Session - 01/01/16 1025    Visit Number 17   Number of Visits 22   Date for PT Re-Evaluation 01/13/16   Authorization Type UHC Medicare (G-code done 14th visit)   Authorization Time Period 10/21/15-12/20/15; recert done 12/07   Authorization - Visit Number 17   Authorization - Number of Visits 24   PT Start Time 1018   PT Stop Time 1107   PT Time Calculation (min) 49 min   Equipment Utilized During Treatment Gait belt   Activity Tolerance Patient tolerated treatment well   Behavior During Therapy [facility] for tasks assessed/performed      Past Medical History  Diagnosis Date  . Hemorrhoids   . Intermittent vertigo   . Osteoporosis     knees  . Benign recurrent vertigo   . Vitamin D deficiency   . Head trauma     status post fall; upper and lower extremities   . GERD (gastroesophageal reflux disease)   . Diverticulosis   . Chronic constipation   . Dermatitis     recurrent  . Hypertension   . Barrett's esophagus     Dr. Karilyn Cota, Dr. Lodema Hong  . Infertility, female   . Peripheral edema     chronic  . Chronic knee pain   . Fatigue     chronic  . Anemia   . Hip fracture, left (HCC)     s/p left arthroplasty 2/16  . Osteoarthritis     of the neck  . DDD (degenerative disc disease), lumbar   . DDD (degenerative disc disease), cervical   . Varicose veins   . Hemorrhoid   . Stable angina (HCC)   . Colon polyps   . Abnormality of gait 11/17/2015  . Parkinson disease (HCC) 11/17/2015  . 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 Knee arthroscopy Right 1999  . Knee arthroscopy Left 1996  . Cholecystectomy,  laparoscopic  06/17/2006    with stones  . Rotator cuff repair  05/14/2010  . Cataract extraction w/ intraocular lens implant Left 01/06/2012    left, [facility]  . Eye surgery  01/24/2012    bilateral cataract extraction  . Colonoscopy  04/19/2012    Procedure: COLONOSCOPY;  Surgeon: Malissa Hippo, MD;  Location: [facility];  Service: Endoscopy;  Laterality: N/A;  1200  . Tonsillectomy and adenoidectomy  1934  . Palate surgery  1988  . Dental implant   09/2014  . Hip arthroplasty Left 01/31/2015    Procedure: ARTHROPLASTY BIPOLAR HIP;  Surgeon: Vickki Hearing, MD;  Location: [facility];  Service: Orthopedics;  Laterality: Left;  . Colonoscopy w/ polypectomy  05/29/1999, 01/12/2012  . Esophagogastroduodenoscopy  01/12/2012  . Upper gi endoscopy  12/12/2006    with biopsy, with colonoscopy - dx: Barrett's disease  . Transthoracic echocardiogram  01/2012    EF=>55%; mild mitral annular calcification & mild MR; trace TR; trace pulm valve regurg  . Nm myocar perf wall motion  02/2012    lexiscan - small, fized apical lateral bowel attenuation artifact; no reversible ischemia; EF 77%; non-diagnostic for ischemia; low risk  . Cardiac catheterization  09/02/2005  mild-mod calcification in mid LAD (20-30% luminal obstruction), otherwise normal coronaries, patent LE arteries (Dr. Jackie Plum)  . Carotid doppler  02/2008    R & L ICAs 0-49% diameter reduction  . Cholecystectomy    . Tonsillectomy    . Colonoscopy N/A 09/25/2015    Procedure: COLONOSCOPY;  Surgeon: Rogene Houston, MD;  Location: [facility];  Service: Endoscopy;  Laterality: N/A;  100    There were no vitals filed for this visit.  Visit Diagnosis:  Difficulty walking  Decreased functional activity tolerance  Weakness of both legs  Postural kyphosis of thoracic region  Risk for falls  Parkinson's disease ([place])  Unsteady gait      Subjective Assessment - 01/01/16 1014    Subjective Pt stated her Rt knee has been really  bothering her, currently pain free today.   Pertinent History Partial hip replacement 01/31/15 on LLE.    Patient Stated Goals Be able to walk long enough to go grocery shopping   Currently in Pain? No/denies             [facility] Adult PT Treatment/Exercise - 01/01/16 0001    Neck Exercises: Prone   Shoulder Extension 15 reps   Rows Limitations 15x   Lumbar Exercises: Stretches   Prone on Elbows Stretch 5 reps  POE with cervical extension; able to hold extension 23" max   Lumbar Exercises: Seated   Other Seated Lumbar Exercises sitting tall tolerance, able to tolerate 20" prior fatigue, 5 sets   Knee/Hip Exercises: Standing   Hip Abduction Both;1 set;10 reps   Abduction Limitations mirror for postural cues    Gait Training gait training x 226 with 1 rest break   Other Standing Knee Exercises standing tall posture strengthning infront of mirros   Knee/Hip Exercises: Supine   Bridges Limitations 1x 10  on wedge, reduced reps due to knee pain   Other Supine Knee/Hip Exercises supine postrual stretch with no pillows, decompression exercies to improve cervical and scapular postural strengthening and stretches for parkinsons for trunk and LE mobiltiy. focus on trunk and cervical extension and shoulder ER x3 mintues today with improved tolerance  and no weights on knees this session due to pain; prone postural stretch x2 minutes    Knee/Hip Exercises: Prone   Hamstring Curl 1 set;10 reps                  PT Short Term Goals - 12/16/15 1415    PT SHORT TERM GOAL #1   Title Pt will be independent with HEP.    Baseline 12/20- still doing it on days she is not here but will not do it sometimes if they are out and walking a lot    Time 3   Period Weeks   Status On-going   PT SHORT TERM GOAL #2   Title Improve BLE strength as evidenced by five time sit to stand time of 35 seconds or less with no UE support.    Time 3   Period Weeks   Status Achieved   PT SHORT TERM GOAL #3    Title Improve functional activity tolerance evidenced by pt ambulating 275 feet or more on 3 minute walk test.    Baseline 12/20- 360f in 3 minutes    Time 3   Period Weeks   Status On-going   PT SHORT TERM GOAL #4   Title Decrease fall risk per TUG time of 25 seconds or less.    Baseline  12/20- 30.6 at best with walker    Time 3   Period Weeks   Status On-going   PT SHORT TERM GOAL #5   Title Patient will demonstrate improved posture at least 75% of the time in order to improve overall mobility, balance, and gait mechanics as well as to reduce fall risk    Baseline 12/20- patient more aware but still working on it    Time 3   Period Weeks   Status On-going   PT SHORT TERM GOAL #6   Title Patient to be regularly imprlementing BIG concepts of speech, movement, and mobility and will also be engaged with local Parkinsons Disease support group in order to promote self-efficacy over disease    Baseline 12/20- has been implementing BIG movements at home, gave info about support group    Time 4   Status Partially Met           PT Long Term Goals - 12/16/15 1419    PT LONG TERM GOAL #1   Title Pt will be independent with advanced HEP for functional strengthening, improving posture, and improving balance.    Time 6   Period Weeks   Status On-going   PT LONG TERM GOAL #2   Title Improve BLE strength evidenced by five time sit to stand time of 20 seconds or less with no UE support.    Time 6   Period Weeks   Status On-going   PT LONG TERM GOAL #3   Title Improve functional activity tolerance evidenced by pt ambulating 450' during 3 minute walk test.    Time 6   Period Weeks   Status On-going   PT LONG TERM GOAL #4   Title Decrease fall risk per TUG time of 18 seconds or less.    Time 6   Period Weeks   Status On-going   PT LONG TERM GOAL #5   Title Pt will report that she has completed grocery shopping with caregiver, ambulating through grocery store with rollator and <3 rest  breaks.    Baseline 12/20- still working on this but has been going on more trips    Time 6   Period Weeks   Status On-going   PT LONG TERM GOAL #6   Time 6   Period Weeks   Status On-going               Plan - 01/01/16 1250    Clinical Impression Statement Session focus on improving postural strengthening and gait training with SPC.  Pt continues to demonstrate extremly weak cervical extensors with ability to tolerance 23" of improved posture prior fatigue.  Gait training with min guard and moderate cueing for posture and to increase stride length for imprroved gait mechanics and energy conservation, pt able to stand with improved posture though unable to continue the improve posture with gait.  Utilized visual feedback from mirror and to look at high clocks to improve posture with gait.  No reports of pain through session though pt did report it hurt bad yesterday.     PT Next Visit Plan Begin prone shoulder flexion next session.  supine and prone postural stretch; postural training; gait with single point cane; functional strengthening. Continue mirror for visual feedback.         Problem List Patient Active Problem List   Diagnosis Date Noted  . Abnormality of gait 11/17/2015  . Parkinson disease ([place]) 11/17/2015  . 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 Unsteady gait  10/15/2015  . Hearing loss in left ear 10/15/2015  . Osteoarthritis of both knees 08/27/2015  . Tremor 08/27/2015  . Depression 03/25/2015  . Anemia 01/31/2015  . Femoral neck fracture ([place]) 01/31/2015  . Faintness   . Fall at home 04/04/2014  . Multiple thyroid nodules 04/04/2014  . GERD (gastroesophageal reflux disease) 04/04/2014  . Postmenopausal atrophic vaginitis 03/12/2014  . Vitamin D deficiency 03/12/2014  . Osteopetrosis 10/23/2013  . Urinary incontinence, urge 10/23/2013  . Thoracic back pain 10/23/2013  . Lumbar back pain 10/23/2013  . Colon polyps 02/27/2012  . CAD, NATIVE VESSEL  12/12/2009  . Bilateral carotid bruits 12/12/2009  . ALLERGIC URTICARIA 09/09/2009  . Osteoarthrosis, unspecified whether generalized or localized, involving lower leg 04/19/2009  . Essential hypertension 10/16/2008  . FATIGUE 10/16/2008  . HEMORRHOIDS 03/29/2008  . INTERMITTENT VERTIGO 03/29/2008   Ihor Austin, [place]; [place]  Aldona Lento 01/01/2016, 12:58 PM  [facility] [address] [place], [place], [address] Phone: [phone]   Fax:  [phone]  Name: Kerry Flynn MRN: [id number] Date of Birth: 04/19/1927

## 2016-01-06 ENCOUNTER — Ambulatory Visit (HOSPITAL_COMMUNITY): Payer: Medicare HMO | Admitting: Physical Therapy

## 2016-01-06 DIAGNOSIS — G20 Parkinson's disease: Secondary | ICD-10-CM

## 2016-01-06 DIAGNOSIS — R29898 Other symptoms and signs involving the musculoskeletal system: Secondary | ICD-10-CM

## 2016-01-06 DIAGNOSIS — R262 Difficulty in walking, not elsewhere classified: Secondary | ICD-10-CM | POA: Diagnosis not present

## 2016-01-06 DIAGNOSIS — M4004 Postural kyphosis, thoracic region: Secondary | ICD-10-CM

## 2016-01-06 DIAGNOSIS — R2681 Unsteadiness on feet: Secondary | ICD-10-CM

## 2016-01-06 DIAGNOSIS — Z9181 History of falling: Secondary | ICD-10-CM

## 2016-01-06 DIAGNOSIS — R6889 Other general symptoms and signs: Secondary | ICD-10-CM

## 2016-01-06 NOTE — Therapy (Signed)
[place], [place], [address] Phone: [phone]   Fax:  [phone]  Physical Therapy Treatment  Patient Details  Name: Kerry Flynn MRN: [id number] Date of Birth: June 23, 1927 Referring Provider: Moshe Cipro   Encounter Date: 01/06/2016      PT End of Session - 01/06/16 1546    Visit Number 18   Number of Visits 22   Date for PT Re-Evaluation 01/13/16   Authorization Type UHC Medicare (G-code done 14th visit)   Authorization Time Period 06/19/75-28/31/51; recert done 76/16   Authorization - Visit Number 27   Authorization - Number of Visits 24   PT Start Time 1430   PT Stop Time 1520   PT Time Calculation (min) 50 min   Equipment Utilized During Treatment Gait belt   Activity Tolerance Patient tolerated treatment well   Behavior During Therapy [facility] for tasks assessed/performed      Past Medical History  Diagnosis Date  . Hemorrhoids   . Intermittent vertigo   . Osteoporosis     knees  . Benign recurrent vertigo   . Vitamin D deficiency   . Head trauma     status post fall; upper and lower extremities   . GERD (gastroesophageal reflux disease)   . Diverticulosis   . Chronic constipation   . Dermatitis     recurrent  . Hypertension   . Barrett's esophagus     Dr. Laural Golden, Dr. Moshe Cipro  . Infertility, female   . Peripheral edema     chronic  . Chronic knee pain   . Fatigue     chronic  . Anemia   . Hip fracture, left ([place])     s/p left arthroplasty 2/16  . Osteoarthritis     of the neck  . DDD (degenerative disc disease), lumbar   . DDD (degenerative disc disease), cervical   . Varicose veins   . Hemorrhoid   . Stable angina (HCC)   . Colon polyps   . Abnormality of gait 11/17/2015  . Parkinson disease ([place]) 11/17/2015  . 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 Knee arthroscopy Right 1999  . Knee arthroscopy Left 1996  .  Cholecystectomy, laparoscopic  06/17/2006    with stones  . Rotator cuff repair  05/14/2010  . Cataract extraction w/ intraocular lens implant Left 01/06/2012    left, [facility]  . Eye surgery  01/24/2012    bilateral cataract extraction  . Colonoscopy  04/19/2012    Procedure: COLONOSCOPY;  Surgeon: Rogene Houston, MD;  [facility]: [facility];  Service: Endoscopy;  Laterality: N/A;  1200  . Tonsillectomy and adenoidectomy  1934  . Palate surgery  1988  . Dental implant   09/2014  . Hip arthroplasty Left 01/31/2015    Procedure: ARTHROPLASTY BIPOLAR HIP;  Surgeon: Carole Civil, MD;  Location: [facility];  Service: Orthopedics;  Laterality: Left;  . Colonoscopy w/ polypectomy  05/29/1999, 01/12/2012  . Esophagogastroduodenoscopy  01/12/2012  . Upper gi endoscopy  12/12/2006    with biopsy, with colonoscopy - dx: Barrett's disease  . Transthoracic echocardiogram  01/2012    EF=>55%; mild mitral annular calcification & mild MR; trace TR; trace pulm valve regurg  . Nm myocar perf wall motion  02/2012    lexiscan - small, fized apical lateral bowel attenuation artifact; no reversible ischemia; EF 77%; non-diagnostic for ischemia; low risk  . Cardiac catheterization  09/02/2005  mild-mod calcification in mid LAD (20-30% luminal obstruction), otherwise normal coronaries, patent LE arteries (Dr. Jackie Plum)  . Carotid doppler  02/2008    R & L ICAs 0-49% diameter reduction  . Cholecystectomy    . Tonsillectomy    . Colonoscopy N/A 09/25/2015    Procedure: COLONOSCOPY;  Surgeon: Rogene Houston, MD;  Location: [facility];  Service: Endoscopy;  Laterality: N/A;  100    There were no vitals filed for this visit.  Visit Diagnosis:  Difficulty walking  Decreased functional activity tolerance  Weakness of both legs  Postural kyphosis of thoracic region  Parkinson's disease ([place])  Unsteady gait  Risk for falls      Subjective Assessment - 01/06/16 1451    Subjective PT states she has not  had any dizziness episodes since this morning when initially getting OOB.  States she also has them if she turns her head too quickly or bends forward to retrieve an item from the floor.  Pt states her knees are bothering her with sharp pains up to 9/10 bilaterally.  Pt states the pain in her Rt knee is new today.   Currently in Pain? Yes   Pain Score 9    Pain Location Knee   Pain Orientation Right;Left   Pain Radiating Towards see above; pain varies with sharp shooting pains of 9/10.                         [facility] Adult PT Treatment/Exercise - 01/06/16 1506    Lumbar Exercises: Stretches   Prone on Elbows Stretch 5 reps   Lumbar Exercises: Seated   Other Seated Lumbar Exercises sitting tall tolerance 1 minute X 3   Knee/Hip Exercises: Standing   Gait Training gait training x 226 in 4 minutes using RW   Other Standing Knee Exercises standing against wall with 5 second holds 10 reps   Knee/Hip Exercises: Supine   Bridges Limitations 15X  with wedge under knees to decrease knee pain   Other Supine Knee/Hip Exercises supine postrual stretch with no pillows, decompression exercies to improve cervical and scapular postural strengthening and stretches for parkinsons for trunk and LE mobiltiy. focus on trunk and cervical extension and shoulder ER x3 mintues today with improved tolerance  and no weights on knees this session due to pain; prone postural stretch x2 minutes    Other Supine Knee/Hip Exercises SLR 10 reps each                  PT Short Term Goals - 12/16/15 1415    PT SHORT TERM GOAL #1   Title Pt will be independent with HEP.    Baseline 12/20- still doing it on days she is not here but will not do it sometimes if they are out and walking a lot    Time 3   Period Weeks   Status On-going   PT SHORT TERM GOAL #2   Title Improve BLE strength as evidenced by five time sit to stand time of 35 seconds or less with no UE support.    Time 3   Period Weeks    Status Achieved   PT SHORT TERM GOAL #3   Title Improve functional activity tolerance evidenced by pt ambulating 275 feet or more on 3 minute walk test.    Baseline 12/20- 376f in 3 minutes    Time 3   Period Weeks   Status On-going   PT  SHORT TERM GOAL #4   Title Decrease fall risk per TUG time of 25 seconds or less.    Baseline 12/20- 30.6 at best with walker    Time 3   Period Weeks   Status On-going   PT SHORT TERM GOAL #5   Title Patient will demonstrate improved posture at least 75% of the time in order to improve overall mobility, balance, and gait mechanics as well as to reduce fall risk    Baseline 12/20- patient more aware but still working on it    Time 3   Period Weeks   Status On-going   PT SHORT TERM GOAL #6   Title Patient to be regularly imprlementing BIG concepts of speech, movement, and mobility and will also be engaged with local Parkinsons Disease support group in order to promote self-efficacy over disease    Baseline 12/20- has been implementing BIG movements at home, gave info about support group    Time 4   Status Partially Met           PT Long Term Goals - 12/16/15 1419    PT LONG TERM GOAL #1   Title Pt will be independent with advanced HEP for functional strengthening, improving posture, and improving balance.    Time 6   Period Weeks   Status On-going   PT LONG TERM GOAL #2   Title Improve BLE strength evidenced by five time sit to stand time of 20 seconds or less with no UE support.    Time 6   Period Weeks   Status On-going   PT LONG TERM GOAL #3   Title Improve functional activity tolerance evidenced by pt ambulating 450' during 3 minute walk test.    Time 6   Period Weeks   Status On-going   PT LONG TERM GOAL #4   Title Decrease fall risk per TUG time of 18 seconds or less.    Time 6   Period Weeks   Status On-going   PT LONG TERM GOAL #5   Title Pt will report that she has completed grocery shopping with caregiver, ambulating  through grocery store with rollator and <3 rest breaks.    Baseline 12/20- still working on this but has been going on more trips    Time 6   Period Weeks   Status On-going   PT LONG TERM GOAL #6   Time 6   Period Weeks   Status On-going               Plan - 01/06/16 1547    Clinical Impression Statement Focused mainly on postural strengthening and endurance.  Added standing against wall, tall with holds and supine SLR today.  Pt reported noted benefit from both.  Pt was able to maintain sititng posture for 1 minute today before fatigue.  Pt request use of RW today instead of SPC with ambulation due to knee pain.  Pt was able to complete full 226 feet in 4 minutes without rest break X 2 bouts.   PT Next Visit Plan Begin prone shoulder flexion next session.  Continue with supine and prone postural stretch; postural training; gait with single point cane; functional strengthening. Continue mirror for visual feedback.         Problem List Patient Active Problem List   Diagnosis Date Noted  . Abnormality of gait 11/17/2015  . Parkinson disease ([place]) 11/17/2015  . 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 Unsteady gait 10/15/2015  . Hearing loss  in left ear 10/15/2015  . Osteoarthritis of both knees 08/27/2015  . Tremor 08/27/2015  . Depression 03/25/2015  . Anemia 01/31/2015  . Femoral neck fracture ([place]) 01/31/2015  . Faintness   . Fall at home 04/04/2014  . Multiple thyroid nodules 04/04/2014  . GERD (gastroesophageal reflux disease) 04/04/2014  . Postmenopausal atrophic vaginitis 03/12/2014  . Vitamin D deficiency 03/12/2014  . Osteopetrosis 10/23/2013  . Urinary incontinence, urge 10/23/2013  . Thoracic back pain 10/23/2013  . Lumbar back pain 10/23/2013  . Colon polyps 02/27/2012  . CAD, NATIVE VESSEL 12/12/2009  . Bilateral carotid bruits 12/12/2009  . ALLERGIC URTICARIA 09/09/2009  . Osteoarthrosis, unspecified whether generalized or localized, involving lower leg  04/19/2009  . Essential hypertension 10/16/2008  . FATIGUE 10/16/2008  . HEMORRHOIDS 03/29/2008  . INTERMITTENT VERTIGO 03/29/2008    Teena Irani, PTA/CLT [phone]  01/06/2016, 3:58 PM  [place] [address] [place], [place], [address] Phone: [phone]   Fax:  [phone]  Name: ALIZAYA OSHEA MRN: [id number] Date of Birth: 1927-08-25

## 2016-01-08 ENCOUNTER — Ambulatory Visit (HOSPITAL_COMMUNITY): Payer: Medicare HMO

## 2016-01-08 DIAGNOSIS — R29898 Other symptoms and signs involving the musculoskeletal system: Secondary | ICD-10-CM

## 2016-01-08 DIAGNOSIS — M4004 Postural kyphosis, thoracic region: Secondary | ICD-10-CM

## 2016-01-08 DIAGNOSIS — G20 Parkinson's disease: Secondary | ICD-10-CM

## 2016-01-08 DIAGNOSIS — R2681 Unsteadiness on feet: Secondary | ICD-10-CM

## 2016-01-08 DIAGNOSIS — Z9181 History of falling: Secondary | ICD-10-CM

## 2016-01-08 DIAGNOSIS — R262 Difficulty in walking, not elsewhere classified: Secondary | ICD-10-CM | POA: Diagnosis not present

## 2016-01-08 DIAGNOSIS — R6889 Other general symptoms and signs: Secondary | ICD-10-CM

## 2016-01-08 NOTE — Therapy (Signed)
[place], [place], [address] Phone: [phone]   Fax:  [phone]  Physical Therapy Treatment  Patient Details  Name: Kerry Flynn MRN: [id number] Date of Birth: Oct 13, 1927 Referring Provider: Moshe Cipro   Encounter Date: 01/08/2016      PT End of Session - 01/08/16 1107    Visit Number 19   Number of Visits 22   Date for PT Re-Evaluation 01/13/16   Authorization Type UHC Medicare (G-code done 14th visit)   Authorization Time Period 07/62/26-33/35/45; recert done 62/56   Authorization - Visit Number 66   Authorization - Number of Visits 24   PT Start Time 1100   PT Stop Time 1145   PT Time Calculation (min) 45 min   Equipment Utilized During Treatment Gait belt   Activity Tolerance Patient tolerated treatment well   Behavior During Therapy [facility] for tasks assessed/performed      Past Medical History  Diagnosis Date  . Hemorrhoids   . Intermittent vertigo   . Osteoporosis     knees  . Benign recurrent vertigo   . Vitamin D deficiency   . Head trauma     status post fall; upper and lower extremities   . GERD (gastroesophageal reflux disease)   . Diverticulosis   . Chronic constipation   . Dermatitis     recurrent  . Hypertension   . Barrett's esophagus     Dr. Laural Golden, Dr. Moshe Cipro  . Infertility, female   . Peripheral edema     chronic  . Chronic knee pain   . Fatigue     chronic  . Anemia   . Hip fracture, left ([place])     s/p left arthroplasty 2/16  . Osteoarthritis     of the neck  . DDD (degenerative disc disease), lumbar   . DDD (degenerative disc disease), cervical   . Varicose veins   . Hemorrhoid   . Stable angina (HCC)   . Colon polyps   . Abnormality of gait 11/17/2015  . Parkinson disease ([place]) 11/17/2015  . 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 Knee arthroscopy Right 1999  . Knee arthroscopy Left 1996  .  Cholecystectomy, laparoscopic  06/17/2006    with stones  . Rotator cuff repair  05/14/2010  . Cataract extraction w/ intraocular lens implant Left 01/06/2012    left, [facility]  . Eye surgery  01/24/2012    bilateral cataract extraction  . Colonoscopy  04/19/2012    Procedure: COLONOSCOPY;  Surgeon: Rogene Houston, MD;  Location: [facility];  Service: Endoscopy;  Laterality: N/A;  1200  . Tonsillectomy and adenoidectomy  1934  . Palate surgery  1988  . Dental implant   09/2014  . Hip arthroplasty Left 01/31/2015    Procedure: ARTHROPLASTY BIPOLAR HIP;  Surgeon: Carole Civil, MD;  Location: [facility] ORS;  Service: Orthopedics;  Laterality: Left;  . Colonoscopy w/ polypectomy  05/29/1999, 01/12/2012  . Esophagogastroduodenoscopy  01/12/2012  . Upper gi endoscopy  12/12/2006    with biopsy, with colonoscopy - dx: Barrett's disease  . Transthoracic echocardiogram  01/2012    EF=>55%; mild mitral annular calcification & mild MR; trace TR; trace pulm valve regurg  . Nm myocar perf wall motion  02/2012    lexiscan - small, fized apical lateral bowel attenuation artifact; no reversible ischemia; EF 77%; non-diagnostic for ischemia; low risk  . Cardiac catheterization  09/02/2005  mild-mod calcification in mid LAD (20-30% luminal obstruction), otherwise normal coronaries, patent LE arteries (Dr. Jackie Plum)  . Carotid doppler  02/2008    R & L ICAs 0-49% diameter reduction  . Cholecystectomy    . Tonsillectomy    . Colonoscopy N/A 09/25/2015    Procedure: COLONOSCOPY;  Surgeon: Rogene Houston, MD;  Location: [facility];  Service: Endoscopy;  Laterality: N/A;  100    There were no vitals filed for this visit.  Visit Diagnosis:  Difficulty walking  Decreased functional activity tolerance  Weakness of both legs  Postural kyphosis of thoracic region  Unsteady gait  Risk for falls  Parkinson's disease ([place])      Subjective Assessment - 01/08/16 1104    Subjective Pt stated she has had  more dizzy episodes morning times, no reports of dizziness today.  Knee feels okay today, no reports    Pertinent History Partial hip replacement 01/31/15 on LLE.    Patient Stated Goals Be able to walk long enough to go grocery shopping   Currently in Pain? No/denies                         [facility] Adult PT Treatment/Exercise - 01/08/16 0001    Neck Exercises: Prone   Shoulder Extension 15 reps   Rows Limitations 15x   Other Prone Exercise attempted shoulder flexion, unable   Lumbar Exercises: Stretches   Prone on Elbows Stretch 3 reps;30 seconds   Lumbar Exercises: Seated   Other Seated Lumbar Exercises sitting tall tolerance 1 minute X 3   Lumbar Exercises: Supine   Bridge 15 reps   Bridge Limitations on wedge   Lumbar Exercises: Sidelying   Clam 10 reps   Hip Abduction 15 reps   Hip Abduction Limitations AAROM   Knee/Hip Exercises: Standing   Abduction Limitations mirror for postural cues    Other Standing Knee Exercises standing tall posture strengthning infront of mirros   Other Standing Knee Exercises sidestep 2RT infront of mat   Knee/Hip Exercises: Seated   Sit to Sand with UE support;Other (comment);5 reps  low mat   Knee/Hip Exercises: Supine   Bridges Limitations 15X   Other Supine Knee/Hip Exercises supine postrual stretch with no pillows, decompression exercies to improve cervical and scapular postural strengthening and stretches for parkinsons for trunk and LE mobiltiy. focus on trunk and cervical extension and shoulder ER x3 mintues today with improved tolerance  and no weights on knees this session due to pain; prone postural stretch x2 minutes    Knee/Hip Exercises: Prone   Hamstring Curl 1 set;10 reps                  PT Short Term Goals - 12/16/15 1415    PT SHORT TERM GOAL #1   Title Pt will be independent with HEP.    Baseline 12/20- still doing it on days she is not here but will not do it sometimes if they are out and walking a  lot    Time 3   Period Weeks   Status On-going   PT SHORT TERM GOAL #2   Title Improve BLE strength as evidenced by five time sit to stand time of 35 seconds or less with no UE support.    Time 3   Period Weeks   Status Achieved   PT SHORT TERM GOAL #3   Title Improve functional activity tolerance evidenced by pt ambulating 275 feet  or more on 3 minute walk test.    Baseline 12/20- 396f in 3 minutes    Time 3   Period Weeks   Status On-going   PT SHORT TERM GOAL #4   Title Decrease fall risk per TUG time of 25 seconds or less.    Baseline 12/20- 30.6 at best with walker    Time 3   Period Weeks   Status On-going   PT SHORT TERM GOAL #5   Title Patient will demonstrate improved posture at least 75% of the time in order to improve overall mobility, balance, and gait mechanics as well as to reduce fall risk    Baseline 12/20- patient more aware but still working on it    Time 3   Period Weeks   Status On-going   PT SHORT TERM GOAL #6   Title Patient to be regularly imprlementing BIG concepts of speech, movement, and mobility and will also be engaged with local Parkinsons Disease support group in order to promote self-efficacy over disease    Baseline 12/20- has been implementing BIG movements at home, gave info about support group    Time 4   Status Partially Met           PT Long Term Goals - 12/16/15 1419    PT LONG TERM GOAL #1   Title Pt will be independent with advanced HEP for functional strengthening, improving posture, and improving balance.    Time 6   Period Weeks   Status On-going   PT LONG TERM GOAL #2   Title Improve BLE strength evidenced by five time sit to stand time of 20 seconds or less with no UE support.    Time 6   Period Weeks   Status On-going   PT LONG TERM GOAL #3   Title Improve functional activity tolerance evidenced by pt ambulating 450' during 3 minute walk test.    Time 6   Period Weeks   Status On-going   PT LONG TERM GOAL #4   Title  Decrease fall risk per TUG time of 18 seconds or less.    Time 6   Period Weeks   Status On-going   PT LONG TERM GOAL #5   Title Pt will report that she has completed grocery shopping with caregiver, ambulating through grocery store with rollator and <3 rest breaks.    Baseline 12/20- still working on this but has been going on more trips    Time 6   Period Weeks   Status On-going   PT LONG TERM GOAL #6   Time 6   Period Weeks   Status On-going               Plan - 01/08/16 1704    Clinical Impression Statement Continued session focus on improving postural strengthening and functional strengthening.  Resumed prone exercises for cervical extensor strengthening and postural strengthening, pt unable to complete shoulder flexion this session due to restricted shoulder ROM.  Gluteal strengthening exercises complete to improve gait mechanics and sidestepping compelte with SPC with min guard assistance..  No gait training complete this session due to Rt knee pain and pt was limited by fatigue with activiites.     PT Next Visit Plan Continue with supine and prone postural stretch; postural training; gait with single point cane; functional strengthening. Continue mirror for visual feedback.         Problem List Patient Active Problem List   Diagnosis Date Noted  .  Abnormality of gait 11/17/2015  . Parkinson disease ([place]) 11/17/2015  . 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 Unsteady gait 10/15/2015  . Hearing loss in left ear 10/15/2015  . Osteoarthritis of both knees 08/27/2015  . Tremor 08/27/2015  . Depression 03/25/2015  . Anemia 01/31/2015  . Femoral neck fracture ([place]) 01/31/2015  . Faintness   . Fall at home 04/04/2014  . Multiple thyroid nodules 04/04/2014  . GERD (gastroesophageal reflux disease) 04/04/2014  . Postmenopausal atrophic vaginitis 03/12/2014  . Vitamin D deficiency 03/12/2014  . Osteopetrosis 10/23/2013  . Urinary incontinence, urge 10/23/2013  . Thoracic  back pain 10/23/2013  . Lumbar back pain 10/23/2013  . Colon polyps 02/27/2012  . CAD, NATIVE VESSEL 12/12/2009  . Bilateral carotid bruits 12/12/2009  . ALLERGIC URTICARIA 09/09/2009  . Osteoarthrosis, unspecified whether generalized or localized, involving lower leg 04/19/2009  . Essential hypertension 10/16/2008  . FATIGUE 10/16/2008  . HEMORRHOIDS 03/29/2008  . INTERMITTENT VERTIGO 03/29/2008   Ihor Austin, LPTA; [place]  Aldona Lento 01/08/2016, 5:14 PM  [place], [place], [address] Phone: [phone]   Fax:  [phone]  Name: Kerry Flynn MRN: [id number] Date of Birth: 05/12/1927

## 2016-01-13 ENCOUNTER — Telehealth (HOSPITAL_COMMUNITY): Payer: Self-pay | Admitting: Physical Therapy

## 2016-01-13 ENCOUNTER — Ambulatory Visit (HOSPITAL_COMMUNITY): Payer: Medicare HMO | Admitting: Physical Therapy

## 2016-01-13 NOTE — Telephone Encounter (Signed)
She is still sick and dizzy, hopes to be better by Thursday. NF 01-13-16

## 2016-01-15 ENCOUNTER — Ambulatory Visit (HOSPITAL_COMMUNITY): Payer: Medicare HMO | Admitting: Physical Therapy

## 2016-01-15 DIAGNOSIS — R29898 Other symptoms and signs involving the musculoskeletal system: Secondary | ICD-10-CM

## 2016-01-15 DIAGNOSIS — G20 Parkinson's disease: Secondary | ICD-10-CM

## 2016-01-15 DIAGNOSIS — M4004 Postural kyphosis, thoracic region: Secondary | ICD-10-CM

## 2016-01-15 DIAGNOSIS — R2681 Unsteadiness on feet: Secondary | ICD-10-CM

## 2016-01-15 DIAGNOSIS — R6889 Other general symptoms and signs: Secondary | ICD-10-CM

## 2016-01-15 DIAGNOSIS — Z9181 History of falling: Secondary | ICD-10-CM

## 2016-01-15 DIAGNOSIS — R262 Difficulty in walking, not elsewhere classified: Secondary | ICD-10-CM

## 2016-01-15 NOTE — Therapy (Signed)
[place], [place], [address] Phone: [phone]   Fax:  [phone]  Physical Therapy Treatment  Patient Details  Name: Kerry Flynn MRN: [id number] Date of Birth: [birth year] Referring Provider: Dr. Moshe Cipro   Encounter Date: 01/15/2016      PT End of Session - 01/15/16 1135    Visit Number 20   Number of Visits 22   Date for PT Re-Evaluation 01/20/16   Authorization Type UHC Medicare (G-code done 14th visit)   Authorization Time Period 44/96/75-91/63/84; recert done 66/59   Authorization - Visit Number 33   Authorization - Number of Visits 24   PT Start Time 1102   PT Stop Time 1133   PT Time Calculation (min) 31 min   Activity Tolerance Patient limited by fatigue   Behavior During Therapy [facility] for tasks assessed/performed      Past Medical History  Diagnosis Date  . Hemorrhoids   . Intermittent vertigo   . Osteoporosis     knees  . Benign recurrent vertigo   . Vitamin D deficiency   . Head trauma     status post fall; upper and lower extremities   . GERD (gastroesophageal reflux disease)   . Diverticulosis   . Chronic constipation   . Dermatitis     recurrent  . Hypertension   . Barrett's esophagus     Dr. Laural Golden, Dr. Moshe Cipro  . Infertility, female   . Peripheral edema     chronic  . Chronic knee pain   . Fatigue     chronic  . Anemia   . Hip fracture, left ([place])     s/p left arthroplasty 2/16  . Osteoarthritis     of the neck  . DDD (degenerative disc disease), lumbar   . DDD (degenerative disc disease), cervical   . Varicose veins   . Hemorrhoid   . Stable angina (HCC)   . Colon polyps   . Abnormality of gait 11/17/2015  . Parkinson disease ([place]) 11/17/2015  . 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 Knee arthroscopy Right 1999  . Knee arthroscopy Left 1996  . Cholecystectomy, laparoscopic  06/17/2006    with stones  .  Rotator cuff repair  05/14/2010  . Cataract extraction w/ intraocular lens implant Left 01/06/2012    left, [facility]  . Eye surgery  01/24/2012    bilateral cataract extraction  . Colonoscopy  04/19/2012    Procedure: COLONOSCOPY;  Surgeon: Rogene Houston, MD;  Location: [facility];  Service: Endoscopy;  Laterality: N/A;  1200  . Tonsillectomy and adenoidectomy  1934  . Palate surgery  1988  . Dental implant   09/2014  . Hip arthroplasty Left 01/31/2015    Procedure: ARTHROPLASTY BIPOLAR HIP;  Surgeon: Carole Civil, MD;  Location: [facility] ORS;  Service: Orthopedics;  Laterality: Left;  . Colonoscopy w/ polypectomy  05/29/1999, 01/12/2012  . Esophagogastroduodenoscopy  01/12/2012  . Upper gi endoscopy  12/12/2006    with biopsy, with colonoscopy - dx: Barrett's disease  . Transthoracic echocardiogram  01/2012    EF=>55%; mild mitral annular calcification & mild MR; trace TR; trace pulm valve regurg  . Nm myocar perf wall motion  02/2012    lexiscan - small, fized apical lateral bowel attenuation artifact; no reversible ischemia; EF 77%; non-diagnostic for ischemia; low risk  . Cardiac catheterization  09/02/2005    mild-mod calcification in mid  LAD (20-30% luminal obstruction), otherwise normal coronaries, patent LE arteries (Dr. Jackie Plum)  . Carotid doppler  02/2008    R & L ICAs 0-49% diameter reduction  . Cholecystectomy    . Tonsillectomy    . Colonoscopy N/A 09/25/2015    Procedure: COLONOSCOPY;  Surgeon: Rogene Houston, MD;  Location: [facility];  Service: Endoscopy;  Laterality: N/A;  100    There were no vitals filed for this visit.  Visit Diagnosis:  Difficulty walking  Decreased functional activity tolerance  Weakness of both legs  Postural kyphosis of thoracic region  Unsteady gait  Risk for falls  Parkinson's disease ([place])      Subjective Assessment - 01/15/16 1105    Subjective Patient reports she is feeling better but still feeling a little sick from her  cold, taking Musinex. Little woozy today. She reports that she has noticed her posture is better in the kitchen; she can turn over easier in bed as well. Walking a long distance is still tough for her to do, will have soreness the next day with a lot of walking.    Pertinent History Partial hip replacement 01/31/15 on LLE.    How long can you stand comfortably? 1/19- 60 minutes    How long can you walk comfortably? 1/19- gait continues to be limited by her knees and fatigue; can get inside [employer] if she is parked close to door    Patient Stated Goals Be able to walk long enough to go grocery shopping   Currently in Pain? Yes   Pain Score 7    Pain Location Knee   Pain Orientation Right;Left            [facility] PT Assessment - 01/15/16 0001    Assessment   Medical Diagnosis Unsteady gait and Parkinsons    Referring Provider Dr. Moshe Cipro    Next MD Visit cannot recall date of followup    Balance Screen   Has the patient fallen in the past 6 months No   Has the patient had a decrease in activity level because of a fear of falling?  Yes   Is the patient reluctant to leave their home because of a fear of falling?  Yes   [place] there 8 hours per day   Available Help at Discharge Personal care attendant   Type of [place] Access Level entry   [place] Two level;Laundry or work area in basement;Bed/bath upstairs  split level home   Actor --  Lift chair   Prior Function   Level of Independence Independent with basic ADLs;Independent with household mobility with device;Needs assistance with homemaking   Vocation Retired   Leisure Enjoys watching TV   6 minute walk test results    Aerobic Endurance Distance Walked 151   Endurance additional comments 3MWT    High Level Balance   High Level Balance Comments TUG 31                     [facility] Adult PT Treatment/Exercise -  01/15/16 0001    Knee/Hip Exercises: Supine   Other Supine Knee/Hip Exercises supine postural stretch x5 minutes                 PT Education - 01/15/16 1134    Education provided Yes   Education Details re-assess postponed to next session when patient  is feeling better    Person(s) Educated Patient   Methods Explanation   Comprehension Verbalized understanding          PT Short Term Goals - 12/16/15 1415    PT SHORT TERM GOAL #1   Title Pt will be independent with HEP.    Baseline 12/20- still doing it on days she is not here but will not do it sometimes if they are out and walking a lot    Time 3   Period Weeks   Status On-going   PT SHORT TERM GOAL #2   Title Improve BLE strength as evidenced by five time sit to stand time of 35 seconds or less with no UE support.    Time 3   Period Weeks   Status Achieved   PT SHORT TERM GOAL #3   Title Improve functional activity tolerance evidenced by pt ambulating 275 feet or more on 3 minute walk test.    Baseline 12/20- 338f in 3 minutes    Time 3   Period Weeks   Status On-going   PT SHORT TERM GOAL #4   Title Decrease fall risk per TUG time of 25 seconds or less.    Baseline 12/20- 30.6 at best with walker    Time 3   Period Weeks   Status On-going   PT SHORT TERM GOAL #5   Title Patient will demonstrate improved posture at least 75% of the time in order to improve overall mobility, balance, and gait mechanics as well as to reduce fall risk    Baseline 12/20- patient more aware but still working on it    Time 3   Period Weeks   Status On-going   PT SHORT TERM GOAL #6   Title Patient to be regularly imprlementing BIG concepts of speech, movement, and mobility and will also be engaged with local Parkinsons Disease support group in order to promote self-efficacy over disease    Baseline 12/20- has been implementing BIG movements at home, gave info about support group    Time 4   Status Partially Met            PT Long Term Goals - 12/16/15 1419    PT LONG TERM GOAL #1   Title Pt will be independent with advanced HEP for functional strengthening, improving posture, and improving balance.    Time 6   Period Weeks   Status On-going   PT LONG TERM GOAL #2   Title Improve BLE strength evidenced by five time sit to stand time of 20 seconds or less with no UE support.    Time 6   Period Weeks   Status On-going   PT LONG TERM GOAL #3   Title Improve functional activity tolerance evidenced by pt ambulating 450' during 3 minute walk test.    Time 6   Period Weeks   Status On-going   PT LONG TERM GOAL #4   Title Decrease fall risk per TUG time of 18 seconds or less.    Time 6   Period Weeks   Status On-going   PT LONG TERM GOAL #5   Title Pt will report that she has completed grocery shopping with caregiver, ambulating through grocery store with rollator and <3 rest breaks.    Baseline 12/20- still working on this but has been going on more trips    Time 6   Period Weeks   Status On-going   PT LONG TERM GOAL #6  Time 6   Period Weeks   Status On-going               Plan - 01/15/16 1125    Clinical Impression Statement Attempted to initiate re-assessment, however patient feeling very worn down and fatigued secondary to cold that she has been battling this week. With initial measurements noted that patient very fatigued and slow moving and achieving much lower functional scores than she had in the past; patient did confirm that she was really not feeling gootd today and "...just wanted to lie down....". Put re0assess on hold today- will   perform next week when she is feeling better to attain optimal scores- and finished session with functional Parkinsons stretch in supine.    Pt will benefit from skilled therapeutic intervention in order to improve on the following deficits Decreased activity tolerance;Decreased balance;Abnormal gait;Decreased endurance;Decreased mobility;Decreased  strength;Difficulty walking;Postural dysfunction   Rehab Potential Good   PT Frequency 2x / week   PT Duration 4 weeks   PT Treatment/Interventions ADLs/Self Care Home Management;Moist Heat;Gait training;Stair training;Functional mobility training;Therapeutic activities;Therapeutic exercise;Balance training;Neuromuscular re-education;Patient/family education;Manual techniques   PT Next Visit Plan Re-assess next session    PT Home Exercise Plan no changes    Consulted and Agree with Plan of Care Patient        Problem List Patient Active Problem List   Diagnosis Date Noted  . Abnormality of gait 11/17/2015  . Parkinson disease ([place]) 11/17/2015  . 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 Unsteady gait 10/15/2015  . Hearing loss in left ear 10/15/2015  . Osteoarthritis of both knees 08/27/2015  . Tremor 08/27/2015  . Depression 03/25/2015  . Anemia 01/31/2015  . Femoral neck fracture ([place]) 01/31/2015  . Faintness   . Fall at home 04/04/2014  . Multiple thyroid nodules 04/04/2014  . GERD (gastroesophageal reflux disease) 04/04/2014  . Postmenopausal atrophic vaginitis 03/12/2014  . Vitamin D deficiency 03/12/2014  . Osteopetrosis 10/23/2013  . Urinary incontinence, urge 10/23/2013  . Thoracic back pain 10/23/2013  . Lumbar back pain 10/23/2013  . Colon polyps 02/27/2012  . CAD, NATIVE VESSEL 12/12/2009  . Bilateral carotid bruits 12/12/2009  . ALLERGIC URTICARIA 09/09/2009  . Osteoarthrosis, unspecified whether generalized or localized, involving lower leg 04/19/2009  . Essential hypertension 10/16/2008  . FATIGUE 10/16/2008  . HEMORRHOIDS 03/29/2008  . INTERMITTENT VERTIGO 03/29/2008    Deniece Ree PT, DPT [place] [address] [place], [place], [address] Phone: [phone]   Fax:  [phone]  Name: Kerry Flynn MRN: [id number] Date of Birth: [birth year]

## 2016-01-20 ENCOUNTER — Ambulatory Visit (HOSPITAL_COMMUNITY): Payer: Medicare HMO | Admitting: Physical Therapy

## 2016-01-20 DIAGNOSIS — R262 Difficulty in walking, not elsewhere classified: Secondary | ICD-10-CM | POA: Diagnosis not present

## 2016-01-20 DIAGNOSIS — Z9181 History of falling: Secondary | ICD-10-CM

## 2016-01-20 DIAGNOSIS — I739 Peripheral vascular disease, unspecified: Secondary | ICD-10-CM | POA: Diagnosis not present

## 2016-01-20 DIAGNOSIS — R29898 Other symptoms and signs involving the musculoskeletal system: Secondary | ICD-10-CM

## 2016-01-20 DIAGNOSIS — R6889 Other general symptoms and signs: Secondary | ICD-10-CM

## 2016-01-20 DIAGNOSIS — R2681 Unsteadiness on feet: Secondary | ICD-10-CM

## 2016-01-20 DIAGNOSIS — G20 Parkinson's disease: Secondary | ICD-10-CM

## 2016-01-20 DIAGNOSIS — M4004 Postural kyphosis, thoracic region: Secondary | ICD-10-CM

## 2016-01-20 NOTE — Therapy (Signed)
[place], [place], [address] Phone: [phone]   Fax:  [phone]  Physical Therapy Treatment (Re-Assessment)  Patient Details  Name: Kerry Flynn MRN: [id number] Date of Birth: May 14, 1927 Referring Provider: Dr. Moshe Cipro   Encounter Date: 01/20/2016      PT End of Session - 01/20/16 1447    Visit Number 21   Number of Visits 25   Date for PT Re-Evaluation 02/17/16   Authorization Type [employer] Medicare (G-code done 21stth visit)   Authorization Time Period 68/34/19-62/22/97; recert done 98/92 and 1/19   Authorization - Visit Number 21   Authorization - Number of Visits 31   PT Start Time 4174  patient running late    PT Stop Time 1436   PT Time Calculation (min) 44 min   Activity Tolerance Patient limited by fatigue   Behavior During Therapy [facility] for tasks assessed/performed      Past Medical History  Diagnosis Date  . Hemorrhoids   . Intermittent vertigo   . Osteoporosis     knees  . Benign recurrent vertigo   . Vitamin D deficiency   . Head trauma     status post fall; upper and lower extremities   . GERD (gastroesophageal reflux disease)   . Diverticulosis   . Chronic constipation   . Dermatitis     recurrent  . Hypertension   . Barrett's esophagus     Dr. Laural Golden, Dr. Moshe Cipro  . Infertility, female   . Peripheral edema     chronic  . Chronic knee pain   . Fatigue     chronic  . Anemia   . Hip fracture, left ([place])     s/p left arthroplasty 2/16  . Osteoarthritis     of the neck  . DDD (degenerative disc disease), lumbar   . DDD (degenerative disc disease), cervical   . Varicose veins   . Hemorrhoid   . Stable angina (HCC)   . Colon polyps   . Abnormality of gait 11/17/2015  . Parkinson disease ([place]) 11/17/2015  . 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 Knee arthroscopy Right 1999  . Knee arthroscopy Left 1996  .  Cholecystectomy, laparoscopic  06/17/2006    with stones  . Rotator cuff repair  05/14/2010  . Cataract extraction w/ intraocular lens implant Left 01/06/2012    left, [facility]  . Eye surgery  01/24/2012    bilateral cataract extraction  . Colonoscopy  04/19/2012    Procedure: COLONOSCOPY;  Surgeon: Rogene Houston, MD;  Location: [facility];  Service: Endoscopy;  Laterality: N/A;  1200  . Tonsillectomy and adenoidectomy  1934  . Palate surgery  1988  . Dental implant   09/2014  . Hip arthroplasty Left 01/31/2015    Procedure: ARTHROPLASTY BIPOLAR HIP;  Surgeon: Carole Civil, MD;  Location: [facility] ORS;  Service: Orthopedics;  Laterality: Left;  . Colonoscopy w/ polypectomy  05/29/1999, 01/12/2012  . Esophagogastroduodenoscopy  01/12/2012  . Upper gi endoscopy  12/12/2006    with biopsy, with colonoscopy - dx: Barrett's disease  . Transthoracic echocardiogram  01/2012    EF=>55%; mild mitral annular calcification & mild MR; trace TR; trace pulm valve regurg  . Nm myocar perf wall motion  02/2012    lexiscan - small, fized apical lateral bowel attenuation artifact; no reversible ischemia; EF 77%; non-diagnostic for ischemia; low risk  . Cardiac catheterization  09/02/2005    mild-mod calcification in mid LAD (20-30% luminal obstruction), otherwise normal coronaries, patent LE arteries (Dr. Jackie Plum)  . Carotid doppler  02/2008    R & L ICAs 0-49% diameter reduction  . Cholecystectomy    . Tonsillectomy    . Colonoscopy N/A 09/25/2015    Procedure: COLONOSCOPY;  Surgeon: Rogene Houston, MD;  Location: [facility];  Service: Endoscopy;  Laterality: N/A;  100    There were no vitals filed for this visit.  Visit Diagnosis:  Difficulty walking - Plan: PT plan of care cert/re-cert  Decreased functional activity tolerance - Plan: PT plan of care cert/re-cert  Weakness of both legs - Plan: PT plan of care cert/re-cert  Postural kyphosis of thoracic region - Plan: PT plan of care  cert/re-cert  Unsteady gait - Plan: PT plan of care cert/re-cert  Risk for falls - Plan: PT plan of care cert/re-cert  Parkinson's disease ([place]) - Plan: PT plan of care cert/re-cert      Subjective Assessment - 01/20/16 1356    Subjective Patient reports that she is feeling a little better today but having more knee pain than usual especially in her R knee; not as woozy today but quick motions still bother her. Pain going from R knee all the way down to the toe.    How long can you stand comfortably? 1/19- 60 minutes    How long can you walk comfortably? 1/19- gait continues to be limited by her knees and fatigue; can get inside [employer] if she is parked close to door    Currently in Pain? Yes   Pain Score 10-Worst pain ever   Pain Location Knee   Pain Orientation Right;Left            [facility] PT Assessment - 01/20/16 0001    Strength   Right Hip Flexion 4/5   Right Hip Extension 2+/5   Right Hip ABduction 3/5   Left Hip Flexion 4-/5   Left Hip Extension 2+/5   Left Hip ABduction 2/5   Right Knee Flexion 4-/5   Right Knee Extension 4/5  painful    Left Knee Flexion 4-/5   Left Knee Extension 5/5   Right Ankle Dorsiflexion 4+/5   Left Ankle Dorsiflexion 5/5   Transfers   Five time sit to stand comments  24  with UE support    6 minute walk test results    Aerobic Endurance Distance Walked 216   Endurance additional comments 3MWT; gait speed 0.4 m/s     High Level Balance   High Level Balance Comments TUG 27.6                              PT Education - 01/20/16 1444    Education provided Yes   Education Details progress with skilled PT services, plan of care moving forward; general information about possible effects of not taking PD medication as well as advice to speak to MD some more about medication and interactions of PD medication with muscle relaxers    Person(s) Educated Patient   Methods Explanation   Comprehension Verbalized  understanding          PT Short Term Goals - 01/20/16 1421    PT SHORT TERM GOAL #1   Title Pt will be independent with HEP.    Baseline 1/24- "I could do better", 2 times per week but reports she is doing  a little bit every day    Time 3   Period Weeks   Status On-going   PT SHORT TERM GOAL #2   Title Improve BLE strength as evidenced by five time sit to stand time of 35 seconds or less with no UE support.    Time 3   Period Weeks   Status Achieved   PT SHORT TERM GOAL #3   Title Improve functional activity tolerance evidenced by pt ambulating 275 feet or more on 3 minute walk test.    Baseline 1/24- 240f; best has been 3061fin 3 minutes but this is not consistent    Time 3   Period Weeks   Status On-going   PT SHORT TERM GOAL #4   Title Decrease fall risk per TUG time of 25 seconds or less.    Baseline 1/24- 27 with walker    Time 3   Period Weeks   Status On-going   PT SHORT TERM GOAL #5   Title Patient will demonstrate improved posture at least 75% of the time in order to improve overall mobility, balance, and gait mechanics as well as to reduce fall risk    Baseline 1/24- improving but still working on it    Time 3   Period Weeks   Status On-going   PT SHORT TERM GOAL #6   Title Patient to be regularly imprlementing BIG concepts of speech, movement, and mobility and will also be engaged with local Parkinsons Disease support group in order to promote self-efficacy over disease    Baseline 1/24- has been working on [employer]t home, plans on coming to next support group (tried to come to last one but was sick)   Time 4   Period Weeks   Status Partially Met           PT Long Term Goals - 01/20/16 1426    PT LONG TERM GOAL #1   Title Pt will be independent with advanced HEP for functional strengthening, improving posture, and improving balance.    Time 6   Period Weeks   Status On-going   PT LONG TERM GOAL #2   Title Improve BLE strength evidenced by  five time sit to stand time of 20 seconds or less with no UE support.    Time 6   Period Weeks   Status On-going   PT LONG TERM GOAL #3   Title Improve functional activity tolerance evidenced by pt ambulating 450' during 3 minute walk test.    Time 6   Period Weeks   Status On-going   PT LONG TERM GOAL #4   Title Decrease fall risk per TUG time of 18 seconds or less.    Time 6   Period Weeks   Status On-going   PT LONG TERM GOAL #5   Title Pt will report that she has completed grocery shopping with caregiver, ambulating through grocery store with rollator and <3 rest breaks.    Time 6   Period Weeks   Status On-going   PT LONG TERM GOAL #6   Title Patient to demonstrate the ability to safely perform perform floor to stand transfer with min guard and good sequencing in order to allow her to safely recover from any possible falls at home    Time 6   Period Weeks   Status On-going               Plan - 01/20/16 1449    Clinical Impression  Statement Re=-assessment performed today. Patient does show minor improvement in functional measures today hwoever does continue to demonstrate significant functional impairments in all areas at this time, including in strength, balance, gait and posture, and functional activity tolerance. Patient also reveals that she has not been taking Parkinsons  medication, and was educated about how this might be affecting her progress with sklled PT services. Advised patient to speak with MD about more Parkinsons management strategies in relation to medications. Caregiver also reports taht patient has been losing motivation to continue wtih PT per comments at home. At this time recommend 4 more skilled sessions in order to focus on functional strength, floor to stand skills, and balance before DC.   Pt will benefit from skilled therapeutic intervention in order to improve on the following deficits Decreased activity tolerance;Decreased balance;Abnormal  gait;Decreased endurance;Decreased mobility;Decreased strength;Difficulty walking;Postural dysfunction   Rehab Potential Good   PT Frequency Other (comment)  4 more sessions    PT Duration Other (comment)  4 more sessions    PT Treatment/Interventions ADLs/Self Care Home Management;Moist Heat;Gait training;Stair training;Functional mobility training;Therapeutic activities;Therapeutic exercise;Balance training;Neuromuscular re-education;Patient/family education;Manual techniques   PT Next Visit Plan Focus on functional sstrength, floor to stand, balance. DC in 4 sessions.    PT Home Exercise Plan no changes    Consulted and Agree with Plan of Care Patient          G-Codes - 02/03/16 1457    Functional Assessment Tool Used Based on skilled clinical assessment of strength, balance, posture, gait, overall fall risk    Functional Limitation Mobility: Walking and moving around   Mobility: Walking and Moving Around Current Status [phone]) At least 60 percent but less than 80 percent impaired, limited or restricted   Mobility: Walking and Moving Around Goal Status [phone]) At least 40 percent but less than 60 percent impaired, limited or restricted      Problem List Patient Active Problem List   Diagnosis Date Noted  . Abnormality of gait 11/17/2015  . Parkinson disease ([place]) 11/17/2015  . 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 Unsteady gait 10/15/2015  . Hearing loss in left ear 10/15/2015  . Osteoarthritis of both knees 08/27/2015  . Tremor 08/27/2015  . Depression 03/25/2015  . Anemia 01/31/2015  . Femoral neck fracture ([place]) 01/31/2015  . Faintness   . Fall at home 04/04/2014  . Multiple thyroid nodules 04/04/2014  . GERD (gastroesophageal reflux disease) 04/04/2014  . Postmenopausal atrophic vaginitis 03/12/2014  . Vitamin D deficiency 03/12/2014  . Osteopetrosis 10/23/2013  . Urinary incontinence, urge 10/23/2013  . Thoracic back pain 10/23/2013  . Lumbar back pain 10/23/2013   . Colon polyps 02/27/2012  . CAD, NATIVE VESSEL 12/12/2009  . Bilateral carotid bruits 12/12/2009  . ALLERGIC URTICARIA 09/09/2009  . Osteoarthrosis, unspecified whether generalized or localized, involving lower leg 04/19/2009  . Essential hypertension 10/16/2008  . FATIGUE 10/16/2008  . HEMORRHOIDS 03/29/2008  . INTERMITTENT VERTIGO 03/29/2008    Physical Therapy Progress Note  Dates of Reporting Period: 12/16/15 to 02/02/2015  Objective Reports of Subjective Statement: see above   Objective Measurements: see above   Goal Update: see above   Plan: see above   Reason Skilled Services are Required: balance, functional strength, floor to stand, advanced HEP     Deniece Ree PT, DPT [place], [place], [address] Phone: [phone]   Fax:  [phone]  Name: Kerry Flynn MRN: [id number] Date of Birth:  07/12/1927     

## 2016-01-22 ENCOUNTER — Ambulatory Visit (HOSPITAL_COMMUNITY): Payer: Medicare HMO | Admitting: Physical Therapy

## 2016-01-22 DIAGNOSIS — R262 Difficulty in walking, not elsewhere classified: Secondary | ICD-10-CM

## 2016-01-22 DIAGNOSIS — R6889 Other general symptoms and signs: Secondary | ICD-10-CM

## 2016-01-22 DIAGNOSIS — R29898 Other symptoms and signs involving the musculoskeletal system: Secondary | ICD-10-CM

## 2016-01-22 DIAGNOSIS — G20 Parkinson's disease: Secondary | ICD-10-CM

## 2016-01-22 DIAGNOSIS — M4004 Postural kyphosis, thoracic region: Secondary | ICD-10-CM

## 2016-01-22 DIAGNOSIS — R2681 Unsteadiness on feet: Secondary | ICD-10-CM

## 2016-01-22 DIAGNOSIS — Z9181 History of falling: Secondary | ICD-10-CM

## 2016-01-22 NOTE — Therapy (Signed)
[place], [place], [address] Phone: [phone]   Fax:  [phone]  Physical Therapy Treatment  Patient Details  Name: Kerry Flynn MRN: [id number] Date of Birth: 11/03/1927 Referring Provider: Dr. Moshe Cipro   Encounter Date: 01/22/2016      PT End of Session - 01/22/16 1333    Visit Number 22   Number of Visits 25   Date for PT Re-Evaluation 02/17/16   Authorization Type UHC Medicare (G-code done 21stth visit)   Authorization Time Period 40/97/35-32/99/24; recert done 26/83 and 4/19   Authorization - Visit Number 61   Authorization - Number of Visits 31   PT Start Time 1105   PT Stop Time 1145   PT Time Calculation (min) 40 min   Activity Tolerance Patient limited by fatigue;Patient limited by pain   Behavior During Therapy [facility] for tasks assessed/performed      Past Medical History  Diagnosis Date  . Hemorrhoids   . Intermittent vertigo   . Osteoporosis     knees  . Benign recurrent vertigo   . Vitamin D deficiency   . Head trauma     status post fall; upper and lower extremities   . GERD (gastroesophageal reflux disease)   . Diverticulosis   . Chronic constipation   . Dermatitis     recurrent  . Hypertension   . Barrett's esophagus     Dr. Laural Golden, Dr. Moshe Cipro  . Infertility, female   . Peripheral edema     chronic  . Chronic knee pain   . Fatigue     chronic  . Anemia   . Hip fracture, left ([place])     s/p left arthroplasty 2/16  . Osteoarthritis     of the neck  . DDD (degenerative disc disease), lumbar   . DDD (degenerative disc disease), cervical   . Varicose veins   . Hemorrhoid   . Stable angina (HCC)   . Colon polyps   . Abnormality of gait 11/17/2015  . Parkinson disease ([place]) 11/17/2015  . 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 Knee arthroscopy Right 1999  . Knee arthroscopy Left 1996  . Cholecystectomy, laparoscopic   06/17/2006    with stones  . Rotator cuff repair  05/14/2010  . Cataract extraction w/ intraocular lens implant Left 01/06/2012    left, [facility]  . Eye surgery  01/24/2012    bilateral cataract extraction  . Colonoscopy  04/19/2012    Procedure: COLONOSCOPY;  Surgeon: Rogene Houston, MD;  Location[facility];  Service: Endoscopy;  Laterality: N/A;  1200  . Tonsillectomy and adenoidectomy  1934  . Palate surgery  1988  . Dental implant   09/2014  . Hip arthroplasty Left 01/31/2015    Procedure: ARTHROPLASTY BIPOLAR HIP;  Surgeon: Carole Civil, MD;  Location: [facility] ORS;  Service: Orthopedics;  Laterality: Left;  . Colonoscopy w/ polypectomy  05/29/1999, 01/12/2012  . Esophagogastroduodenoscopy  01/12/2012  . Upper gi endoscopy  12/12/2006    with biopsy, with colonoscopy - dx: Barrett's disease  . Transthoracic echocardiogram  01/2012    EF=>55%; mild mitral annular calcification & mild MR; trace TR; trace pulm valve regurg  . Nm myocar perf wall motion  02/2012    lexiscan - small, fized apical lateral bowel attenuation artifact; no reversible ischemia; EF 77%; non-diagnostic for ischemia; low risk  . Cardiac catheterization  09/02/2005  mild-mod calcification in mid LAD (20-30% luminal obstruction), otherwise normal coronaries, patent LE arteries (Dr. Jackie Plum)  . Carotid doppler  02/2008    R & L ICAs 0-49% diameter reduction  . Cholecystectomy    . Tonsillectomy    . Colonoscopy N/A 09/25/2015    Procedure: COLONOSCOPY;  Surgeon: Rogene Houston, MD;  Location[facility];  Service: Endoscopy;  Laterality: N/A;  100    There were no vitals filed for this visit.  Visit Diagnosis:  Difficulty walking  Decreased functional activity tolerance  Weakness of both legs  Postural kyphosis of thoracic region  Unsteady gait  Risk for falls  Parkinson's disease ([place])      Subjective Assessment - 01/22/16 1109    Subjective Patient reports that she is doing better today, but  still having some pain in her R knee going down to toes    Pertinent History Partial hip replacement 01/31/15 on LLE.    Currently in Pain? Yes   Pain Score 9    Pain Location Knee   Pain Orientation Right;Left                         [facility] Adult PT Treatment/Exercise - 01/22/16 0001    Lumbar Exercises: Supine   Bridge 15 reps   Straight Leg Raise 10 reps   Other Supine Lumbar Exercises hip ABD 1x10   Knee/Hip Exercises: Standing   Other Standing Knee Exercises introduced half kneel and half lunge as pre-requisites for floor to stand; difficulty with this due to pain in knees    Knee/Hip Exercises: Supine   Other Supine Knee/Hip Exercises supine postural stretch x3 minutes, prone postural stretch x3 minutes                 PT Education - 01/22/16 1333    Education provided Yes   Education Details proper form for half floor to stand transfer    Person(s) Educated Patient   Methods Explanation   Comprehension Need further instruction          PT Short Term Goals - 01/20/16 1421    PT SHORT TERM GOAL #1   Title Pt will be independent with HEP.    Baseline 1/24- "I could do better", 2 times per week but reports she is doing a little bit every day    Time 3   Period Weeks   Status On-going   PT SHORT TERM GOAL #2   Title Improve BLE strength as evidenced by five time sit to stand time of 35 seconds or less with no UE support.    Time 3   Period Weeks   Status Achieved   PT SHORT TERM GOAL #3   Title Improve functional activity tolerance evidenced by pt ambulating 275 feet or more on 3 minute walk test.    Baseline 1/24- 270f; best has been 3021fin 3 minutes but this is not consistent    Time 3   Period Weeks   Status On-going   PT SHORT TERM GOAL #4   Title Decrease fall risk per TUG time of 25 seconds or less.    Baseline 1/24- 27 with walker    Time 3   Period Weeks   Status On-going   PT SHORT TERM GOAL #5   Title Patient will  demonstrate improved posture at least 75% of the time in order to improve overall mobility, balance, and gait mechanics as well as  to reduce fall risk    Baseline 1/24- improving but still working on it    Time 3   Period Weeks   Status On-going   PT SHORT TERM GOAL #6   Title Patient to be regularly imprlementing BIG concepts of speech, movement, and mobility and will also be engaged with local Parkinsons Disease support group in order to promote self-efficacy over disease    Baseline 1/24- has been working on [employer] at home, plans on coming to next support group (tried to come to last one but was sick)   Time 4   Period Weeks   Status Partially Met           PT Long Term Goals - 01/20/16 1426    PT LONG TERM GOAL #1   Title Pt will be independent with advanced HEP for functional strengthening, improving posture, and improving balance.    Time 6   Period Weeks   Status On-going   PT LONG TERM GOAL #2   Title Improve BLE strength evidenced by five time sit to stand time of 20 seconds or less with no UE support.    Time 6   Period Weeks   Status On-going   PT LONG TERM GOAL #3   Title Improve functional activity tolerance evidenced by pt ambulating 450' during 3 minute walk test.    Time 6   Period Weeks   Status On-going   PT LONG TERM GOAL #4   Title Decrease fall risk per TUG time of 18 seconds or less.    Time 6   Period Weeks   Status On-going   PT LONG TERM GOAL #5   Title Pt will report that she has completed grocery shopping with caregiver, ambulating through grocery store with rollator and <3 rest breaks.    Time 6   Period Weeks   Status On-going   PT LONG TERM GOAL #6   Title Patient to demonstrate the ability to safely perform perform floor to stand transfer with min guard and good sequencing in order to allow her to safely recover from any possible falls at home    Time 6   Period Weeks   Status On-going               Plan - 01/22/16 1334     Clinical Impression Statement Focused on functional postural stretching and functional exercise today; however, functional exercise limited by severe knee pain R LE and patient did require rest break mid-session to resolve pain. Introduced pre-requsite activity for full floor to stand (kneeling halfway down to floor with feet parallel and also one foot forward for half lunge) and coming back up. Patient did rqurie education regarding reasons for correct sequencing for floor to stand today as she described unsafe techniuqe to do thsi at home (putting arms up on obejct and performing downaward dog to stand rather than half kneel and pushing up).    Pt will benefit from skilled therapeutic intervention in order to improve on the following deficits Decreased activity tolerance;Decreased balance;Abnormal gait;Decreased endurance;Decreased mobility;Decreased strength;Difficulty walking;Postural dysfunction   Rehab Potential Good   PT Frequency Other (comment)   PT Duration Other (comment)   PT Treatment/Interventions ADLs/Self Care Home Management;Moist Heat;Gait training;Stair training;Functional mobility training;Therapeutic activities;Therapeutic exercise;Balance training;Neuromuscular re-education;Patient/family education;Manual techniques   PT Next Visit Plan Focus on functional sstrength, floor to stand, balance. DC in 4 sessions.    PT Home Exercise Plan no changes    Consulted and  Agree with Plan of Care Patient        Problem List Patient Active Problem List   Diagnosis Date Noted  . Abnormality of gait 11/17/2015  . Parkinson disease ([place]) 11/17/2015  . 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 Unsteady gait 10/15/2015  . Hearing loss in left ear 10/15/2015  . Osteoarthritis of both knees 08/27/2015  . Tremor 08/27/2015  . Depression 03/25/2015  . Anemia 01/31/2015  . Femoral neck fracture ([place]) 01/31/2015  . Faintness   . Fall at home 04/04/2014  . Multiple thyroid nodules 04/04/2014   . GERD (gastroesophageal reflux disease) 04/04/2014  . Postmenopausal atrophic vaginitis 03/12/2014  . Vitamin D deficiency 03/12/2014  . Osteopetrosis 10/23/2013  . Urinary incontinence, urge 10/23/2013  . Thoracic back pain 10/23/2013  . Lumbar back pain 10/23/2013  . Colon polyps 02/27/2012  . CAD, NATIVE VESSEL 12/12/2009  . Bilateral carotid bruits 12/12/2009  . ALLERGIC URTICARIA 09/09/2009  . Osteoarthrosis, unspecified whether generalized or localized, involving lower leg 04/19/2009  . Essential hypertension 10/16/2008  . FATIGUE 10/16/2008  . HEMORRHOIDS 03/29/2008  . INTERMITTENT VERTIGO 03/29/2008    Deniece Ree PT, DPT [place] [address] [place], [place], [address] Phone: [phone]   Fax:  [phone]  Name: Kerry Flynn MRN: [id number] Date of Birth: 1927/01/09

## 2016-01-27 ENCOUNTER — Ambulatory Visit (HOSPITAL_COMMUNITY): Payer: Medicare HMO | Admitting: Physical Therapy

## 2016-01-27 DIAGNOSIS — R29898 Other symptoms and signs involving the musculoskeletal system: Secondary | ICD-10-CM

## 2016-01-27 DIAGNOSIS — Z9181 History of falling: Secondary | ICD-10-CM

## 2016-01-27 DIAGNOSIS — R6889 Other general symptoms and signs: Secondary | ICD-10-CM

## 2016-01-27 DIAGNOSIS — G20A1 Parkinson's disease without dyskinesia, without mention of fluctuations: Secondary | ICD-10-CM

## 2016-01-27 DIAGNOSIS — R262 Difficulty in walking, not elsewhere classified: Secondary | ICD-10-CM | POA: Diagnosis not present

## 2016-01-27 DIAGNOSIS — G20 Parkinson's disease: Secondary | ICD-10-CM

## 2016-01-27 DIAGNOSIS — M4004 Postural kyphosis, thoracic region: Secondary | ICD-10-CM

## 2016-01-27 DIAGNOSIS — R2681 Unsteadiness on feet: Secondary | ICD-10-CM

## 2016-01-27 NOTE — Therapy (Signed)
[place], [place], [address] Phone: [phone]   Fax:  [phone]  Physical Therapy Treatment  Patient Details  Name: Kerry Flynn MRN: [id number] Date of Birth: 10-10-1927 Referring Provider: Dr. Moshe Cipro   Encounter Date: 01/27/2016      PT End of Session - 01/27/16 1537    Visit Number 23   Number of Visits 25   Date for PT Re-Evaluation 02/17/16   Authorization Type UHC Medicare (G-code done 21stth visit)   Authorization Time Period 37/34/28-76/81/15; recert done 72/62 and 0/35   Authorization - Visit Number 23   Authorization - Number of Visits 31   PT Start Time 5974   PT Stop Time 1427   PT Time Calculation (min) 40 min   Activity Tolerance Patient limited by fatigue;Patient limited by pain   Behavior During Therapy [facility] for tasks assessed/performed      Past Medical History  Diagnosis Date  . Hemorrhoids   . Intermittent vertigo   . Osteoporosis     knees  . Benign recurrent vertigo   . Vitamin D deficiency   . Head trauma     status post fall; upper and lower extremities   . GERD (gastroesophageal reflux disease)   . Diverticulosis   . Chronic constipation   . Dermatitis     recurrent  . Hypertension   . Barrett's esophagus     Dr. Laural Golden, Dr. Moshe Cipro  . Infertility, female   . Peripheral edema     chronic  . Chronic knee pain   . Fatigue     chronic  . Anemia   . Hip fracture, left ([place])     s/p left arthroplasty 2/16  . Osteoarthritis     of the neck  . DDD (degenerative disc disease), lumbar   . DDD (degenerative disc disease), cervical   . Varicose veins   . Hemorrhoid   . Stable angina (HCC)   . Colon polyps   . Abnormality of gait 11/17/2015  . Parkinson disease ([place]) 11/17/2015  . 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 Knee arthroscopy Right 1999  . Knee arthroscopy Left 1996  . Cholecystectomy, laparoscopic   06/17/2006    with stones  . Rotator cuff repair  05/14/2010  . Cataract extraction w/ intraocular lens implant Left 01/06/2012    left, [facility]  . Eye surgery  01/24/2012    bilateral cataract extraction  . Colonoscopy  04/19/2012    Procedure: COLONOSCOPY;  Surgeon: Rogene Houston, MD;  Location[facility];  Service: Endoscopy;  Laterality: N/A;  1200  . Tonsillectomy and adenoidectomy  1934  . Palate surgery  1988  . Dental implant   09/2014  . Hip arthroplasty Left 01/31/2015    Procedure: ARTHROPLASTY BIPOLAR HIP;  Surgeon: Carole Civil, MD;  Location: [facility] ORS;  Service: Orthopedics;  Laterality: Left;  . Colonoscopy w/ polypectomy  05/29/1999, 01/12/2012  . Esophagogastroduodenoscopy  01/12/2012  . Upper gi endoscopy  12/12/2006    with biopsy, with colonoscopy - dx: Barrett's disease  . Transthoracic echocardiogram  01/2012    EF=>55%; mild mitral annular calcification & mild MR; trace TR; trace pulm valve regurg  . Nm myocar perf wall motion  02/2012    lexiscan - small, fized apical lateral bowel attenuation artifact; no reversible ischemia; EF 77%; non-diagnostic for ischemia; low risk  . Cardiac catheterization  09/02/2005  mild-mod calcification in mid LAD (20-30% luminal obstruction), otherwise normal coronaries, patent LE arteries (Dr. Jackie Plum)  . Carotid doppler  02/2008    R & L ICAs 0-49% diameter reduction  . Cholecystectomy    . Tonsillectomy    . Colonoscopy N/A 09/25/2015    Procedure: COLONOSCOPY;  Surgeon: Rogene Houston, MD;  Location: [facility] END[facility] SU[facility];  Service: Endoscopy;  Laterality: N/A;  100    There were no vitals filed for this visit.  Visit Diagnosis:  Difficulty walking  Decreased functional activity tolerance  Weakness of both legs  Postural kyphosis of thoracic region  Unsteady gait  Risk for falls  Parkinson's disease ([place])      Subjective Assessment - 01/27/16 1356    Subjective Patient reports her day is going OK, still having  quite a bit of pain in her knees however    Pertinent History Partial hip replacement 01/31/15 on LLE.    Currently in Pain? Yes   Pain Score 9    Pain Location Knee   Pain Orientation Right;Left                         [facility] Adult PT Treatment/Exercise - 01/27/16 0001    Ambulation/Gait   Gait Comments gait approx 2x88f with 2# weights on each foot    Knee/Hip Exercises: Standing   Other Standing Knee Exercises partial floor to stand off of 8" box with foam pad on top 1x5 each side    Other Standing Knee Exercises sit to stand with functional postural training in standing 1x10   Knee/Hip Exercises: Seated   Long Arc Quad Both;1 set;10 reps   Long Arc Quad Weight 2 lbs.   Knee/Hip Exercises: Supine   Other Supine Knee/Hip Exercises supine postural stretch x3 minutes, prone postural stretch x3 minutes                 PT Education - 01/27/16 1537    Education provided No          PT Short Term Goals - 01/20/16 1421    PT SHORT TERM GOAL #1   Title Pt will be independent with HEP.    Baseline 1/24- "I could do better", 2 times per week but reports she is doing a little bit every day    Time 3   Period Weeks   Status On-going   PT SHORT TERM GOAL #2   Title Improve BLE strength as evidenced by five time sit to stand time of 35 seconds or less with no UE support.    Time 3   Period Weeks   Status Achieved   PT SHORT TERM GOAL #3   Title Improve functional activity tolerance evidenced by pt ambulating 275 feet or more on 3 minute walk test.    Baseline 1/24- 2144f best has been 30025fn 3 minutes but this is not consistent    Time 3   Period Weeks   Status On-going   PT SHORT TERM GOAL #4   Title Decrease fall risk per TUG time of 25 seconds or less.    Baseline 1/24- 27 with walker    Time 3   Period Weeks   Status On-going   PT SHORT TERM GOAL #5   Title Patient will demonstrate improved posture at least 75% of the time in order to improve  overall mobility, balance, and gait mechanics as well as to reduce fall risk  Baseline 1/24- improving but still working on it    Time 3   Period Weeks   Status On-going   PT SHORT TERM GOAL #6   Title Patient to be regularly imprlementing BIG concepts of speech, movement, and mobility and will also be engaged with local Parkinsons Disease support group in order to promote self-efficacy over disease    Baseline 1/24- has been working on [place] at home, plans on coming to next support group (tried to come to last one but was sick)   Time 4   Period Weeks   Status Partially Met           PT Long Term Goals - 01/20/16 1426    PT LONG TERM GOAL #1   Title Pt will be independent with advanced HEP for functional strengthening, improving posture, and improving balance.    Time 6   Period Weeks   Status On-going   PT LONG TERM GOAL #2   Title Improve BLE strength evidenced by five time sit to stand time of 20 seconds or less with no UE support.    Time 6   Period Weeks   Status On-going   PT LONG TERM GOAL #3   Title Improve functional activity tolerance evidenced by pt ambulating 450' during 3 minute walk test.    Time 6   Period Weeks   Status On-going   PT LONG TERM GOAL #4   Title Decrease fall risk per TUG time of 18 seconds or less.    Time 6   Period Weeks   Status On-going   PT LONG TERM GOAL #5   Title Pt will report that she has completed grocery shopping with caregiver, ambulating through grocery store with rollator and <3 rest breaks.    Time 6   Period Weeks   Status On-going   PT LONG TERM GOAL #6   Title Patient to demonstrate the ability to safely perform perform floor to stand transfer with min guard and good sequencing in order to allow her to safely recover from any possible falls at home    Time 6   Period Weeks   Status On-going               Plan - 01/27/16 1539    Clinical Impression Statement Began session with postural stretches  in supine and prone. Continued practicing sequencing and general motion of floor to stand from half kneeling position today off of elevated surface and close min guard from therapist. Also performed general LE strengthening via LAQs and gait with weights on ankles. Did notice improved gait pattern with weight on ankles. Ctoninue to be limited by knee pain. Did note increased posterior LOB as well as postural deficits and tremor today, likely due to parkinsonian symptoms.    Pt will benefit from skilled therapeutic intervention in order to improve on the following deficits Decreased activity tolerance;Decreased balance;Abnormal gait;Decreased endurance;Decreased mobility;Decreased strength;Difficulty walking;Postural dysfunction   Rehab Potential Good   PT Frequency Other (comment)   PT Duration Other (comment)   PT Treatment/Interventions ADLs/Self Care Home Management;Moist Heat;Gait training;Stair training;Functional mobility training;Therapeutic activities;Therapeutic exercise;Balance training;Neuromuscular re-education;Patient/family education;Manual techniques   PT Next Visit Plan Focus on functional sstrength, floor to stand, balance. DC in 2 sessions. F/U on parkinsons meds conversation with pt regarding contacting MD. Work on advanced HEP.    PT Home Exercise Plan no changes    Consulted and Agree with Plan of Care Patient  Problem List Patient Active Problem List   Diagnosis Date Noted  . Abnormality of gait 11/17/2015  . Parkinson disease ([place]) 11/17/2015  . 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 Unsteady gait 10/15/2015  . Hearing loss in left ear 10/15/2015  . Osteoarthritis of both knees 08/27/2015  . Tremor 08/27/2015  . Depression 03/25/2015  . Anemia 01/31/2015  . Femoral neck fracture ([place]) 01/31/2015  . Faintness   . Fall at home 04/04/2014  . Multiple thyroid nodules 04/04/2014  . GERD (gastroesophageal reflux disease) 04/04/2014  . Postmenopausal atrophic  vaginitis 03/12/2014  . Vitamin D deficiency 03/12/2014  . Osteopetrosis 10/23/2013  . Urinary incontinence, urge 10/23/2013  . Thoracic back pain 10/23/2013  . Lumbar back pain 10/23/2013  . Colon polyps 02/27/2012  . CAD, NATIVE VESSEL 12/12/2009  . Bilateral carotid bruits 12/12/2009  . ALLERGIC URTICARIA 09/09/2009  . Osteoarthrosis, unspecified whether generalized or localized, involving lower leg 04/19/2009  . Essential hypertension 10/16/2008  . FATIGUE 10/16/2008  . HEMORRHOIDS 03/29/2008  . INTERMITTENT VERTIGO 03/29/2008    Deniece Ree PT, DPT [place] [address] [place], [place], [address] Phone: [phone]   Fax:  [phone]  Name: Kerry Flynn MRN: [id number] Date of Birth: Oct 04, 1927

## 2016-01-29 ENCOUNTER — Ambulatory Visit (HOSPITAL_COMMUNITY): Payer: Medicare HMO | Attending: Family Medicine | Admitting: Physical Therapy

## 2016-01-29 DIAGNOSIS — R29898 Other symptoms and signs involving the musculoskeletal system: Secondary | ICD-10-CM | POA: Insufficient documentation

## 2016-01-29 DIAGNOSIS — R262 Difficulty in walking, not elsewhere classified: Secondary | ICD-10-CM | POA: Insufficient documentation

## 2016-01-29 DIAGNOSIS — R6889 Other general symptoms and signs: Secondary | ICD-10-CM | POA: Diagnosis not present

## 2016-01-29 DIAGNOSIS — M4004 Postural kyphosis, thoracic region: Secondary | ICD-10-CM | POA: Insufficient documentation

## 2016-01-29 DIAGNOSIS — Z9181 History of falling: Secondary | ICD-10-CM | POA: Diagnosis not present

## 2016-01-29 DIAGNOSIS — G20 Parkinson's disease: Secondary | ICD-10-CM | POA: Diagnosis not present

## 2016-01-29 DIAGNOSIS — R2681 Unsteadiness on feet: Secondary | ICD-10-CM | POA: Diagnosis not present

## 2016-01-29 NOTE — Therapy (Signed)
[place], [place], [address] Phone: [phone]   Fax:  [phone]  Physical Therapy Treatment  Patient Details  Name: Kerry Flynn MRN: [id number] Date of Birth: October 17, 1927 Referring Provider: Dr. Moshe Cipro   Encounter Date: 01/29/2016      PT End of Session - 01/29/16 1512    Visit Number 24   Number of Visits 25   Date for PT Re-Evaluation 02/17/16   Authorization Type UHC Medicare (G-code done 21stth visit)   Authorization Time Period 38/10/17-51/02/58; recert done 52/77 and 8/24   Authorization - Visit Number 24   Authorization - Number of Visits 31   PT Start Time 1107  arrived a few minutes late    PT Stop Time 1140  limited by fatigue and increased pai n in knees    PT Time Calculation (min) 33 min   Equipment Utilized During Treatment Gait belt   Activity Tolerance Patient limited by fatigue;Patient limited by pain   Behavior During Therapy [facility] for tasks assessed/performed      Past Medical History  Diagnosis Date  . Hemorrhoids   . Intermittent vertigo   . Osteoporosis     knees  . Benign recurrent vertigo   . Vitamin D deficiency   . Head trauma     status post fall; upper and lower extremities   . GERD (gastroesophageal reflux disease)   . Diverticulosis   . Chronic constipation   . Dermatitis     recurrent  . Hypertension   . Barrett's esophagus     Dr. Laural Golden, Dr. Moshe Cipro  . Infertility, female   . Peripheral edema     chronic  . Chronic knee pain   . Fatigue     chronic  . Anemia   . Hip fracture, left ([place])     s/p left arthroplasty 2/16  . Osteoarthritis     of the neck  . DDD (degenerative disc disease), lumbar   . DDD (degenerative disc disease), cervical   . Varicose veins   . Hemorrhoid   . Stable angina (HCC)   . Colon polyps   . Abnormality of gait 11/17/2015  . Parkinson disease ([place]) 11/17/2015  . 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 Knee arthroscopy Right 1999  . Knee arthroscopy Left 1996  . Cholecystectomy, laparoscopic  06/17/2006    with stones  . Rotator cuff repair  05/14/2010  . Cataract extraction w/ intraocular lens implant Left 01/06/2012    left, [facility]  . Eye surgery  01/24/2012    bilateral cataract extraction  . Colonoscopy  04/19/2012    Procedure: COLONOSCOPY;  Surgeon: Rogene Houston, MD;  Location[facility];  Service: Endoscopy;  Laterality: N/A;  1200  . Tonsillectomy and adenoidectomy  1934  . Palate surgery  1988  . Dental implant   09/2014  . Hip arthroplasty Left 01/31/2015    Procedure: ARTHROPLASTY BIPOLAR HIP;  Surgeon: Carole Civil, MD;  Location: [facility] ORS;  Service: Orthopedics;  Laterality: Left;  . Colonoscopy w/ polypectomy  05/29/1999, 01/12/2012  . Esophagogastroduodenoscopy  01/12/2012  . Upper gi endoscopy  12/12/2006    with biopsy, with colonoscopy - dx: Barrett's disease  . Transthoracic echocardiogram  01/2012    EF=>55%; mild mitral annular calcification & mild MR; trace TR; trace pulm valve regurg  . Nm myocar perf wall motion  02/2012    lexiscan -  small, fized apical lateral bowel attenuation artifact; no reversible ischemia; EF 77%; non-diagnostic for ischemia; low risk  . Cardiac catheterization  09/02/2005    mild-mod calcification in mid LAD (20-30% luminal obstruction), otherwise normal coronaries, patent LE arteries (Dr. Jackie Plum)  . Carotid doppler  02/2008    R & L ICAs 0-49% diameter reduction  . Cholecystectomy    . Tonsillectomy    . Colonoscopy N/A 09/25/2015    Procedure: COLONOSCOPY;  Surgeon: Rogene Houston, MD;  Location: [facility];  Service: Endoscopy;  Laterality: N/A;  100    There were no vitals filed for this visit.  Visit Diagnosis:  Difficulty walking  Decreased functional activity tolerance  Weakness of both legs  Postural kyphosis of thoracic region  Unsteady gait  Risk for  falls  Parkinson's disease [facility])      Subjective Assessment - 01/29/16 1113    Subjective Patient reports her knees are a bit angry today; she also reports that she had a loss of balance at home after which she had to take a lot of fast, short steps to recover from.    Pertinent History Partial hip replacement 01/31/15 on LLE.    Currently in Pain? Yes   Pain Score 8    Pain Location Knee   Pain Orientation Right;Left   Pain Descriptors / Indicators Aching;[facility] Adult PT Treatment/Exercise - 01/29/16 0001    Lumbar Exercises: Supine   Bridge 10 reps   Straight Leg Raise 10 reps   Other Supine Lumbar Exercises hip ABD 1x10   Knee/Hip Exercises: Standing   Other Standing Knee Exercises partial floor to stand off of 4" box with foam pad on top 1x5 each side    Knee/Hip Exercises: Supine   Other Supine Knee/Hip Exercises supine postural stretch x3 minutes, prone postural stretch x3 minutes                 PT Education - 01/29/16 1511    Education provided Yes   Education Details continued to encourage patient to speak more to MD about medication management rather than just allowing symptoms to go untreated; likely DC next session    Person(s) Educated Patient   Methods Explanation   Comprehension Verbalized understanding          PT Short Term Goals - 01/20/16 1421    PT SHORT TERM GOAL #1   Title Pt will be independent with HEP.    Baseline 1/24- "I could do better", 2 times per week but reports she is doing a little bit every day    Time 3   Period Weeks   Status On-going   PT SHORT TERM GOAL #2   Title Improve BLE strength as evidenced by five time sit to stand time of 35 seconds or less with no UE support.    Time 3   Period Weeks   Status Achieved   PT SHORT TERM GOAL #3   Title Improve functional activity tolerance evidenced by pt ambulating 275 feet or more on 3 minute walk test.    Baseline 1/24- 224f; best  has been 3064fin 3 minutes but this is not consistent    Time 3   Period Weeks   Status On-going   PT SHORT TERM GOAL #4   Title Decrease fall risk per TUG time of  25 seconds or less.    Baseline 1/24- 27 with walker    Time 3   Period Weeks   Status On-going   PT SHORT TERM GOAL #5   Title Patient will demonstrate improved posture at least 75% of the time in order to improve overall mobility, balance, and gait mechanics as well as to reduce fall risk    Baseline 1/24- improving but still working on it    Time 3   Period Weeks   Status On-going   PT SHORT TERM GOAL #6   Title Patient to be regularly imprlementing BIG concepts of speech, movement, and mobility and will also be engaged with local Parkinsons Disease support group in order to promote self-efficacy over disease    Baseline 1/24- has been working on [place] at home, plans on coming to next support group (tried to come to last one but was sick)   Time 4   Period Weeks   Status Partially Met           PT Long Term Goals - 01/20/16 1426    PT LONG TERM GOAL #1   Title Pt will be independent with advanced HEP for functional strengthening, improving posture, and improving balance.    Time 6   Period Weeks   Status On-going   PT LONG TERM GOAL #2   Title Improve BLE strength evidenced by five time sit to stand time of 20 seconds or less with no UE support.    Time 6   Period Weeks   Status On-going   PT LONG TERM GOAL #3   Title Improve functional activity tolerance evidenced by pt ambulating 450' during 3 minute walk test.    Time 6   Period Weeks   Status On-going   PT LONG TERM GOAL #4   Title Decrease fall risk per TUG time of 18 seconds or less.    Time 6   Period Weeks   Status On-going   PT LONG TERM GOAL #5   Title Pt will report that she has completed grocery shopping with caregiver, ambulating through grocery store with rollator and <3 rest breaks.    Time 6   Period Weeks   Status On-going    PT LONG TERM GOAL #6   Title Patient to demonstrate the ability to safely perform perform floor to stand transfer with min guard and good sequencing in order to allow her to safely recover from any possible falls at home    Time 6   Period Weeks   Status On-going               Plan - 01/29/16 1523    Clinical Impression Statement Performed continuation of functional stretching, functional strengthening, and progressed floor to stand activities. Patient able to perform partial floor to stand from 4 inch box with pad on it but did not want to try dropping box further today due to knee pain. Increased ease of sequencing with this today. Patient continues to demosntrate progression of parkinsonian symptoms as evidenced by resting tremor, kyphotic forward head posture, and unsteadiness especially in posterior direction; patient reports she is still unmedicated for her condition and was educated to return to MD to speak further about management in light of ongoing symptoms. Re-assess and likely DC next session due to lack of progress at this time.    Pt will benefit from skilled therapeutic intervention in order to improve on the following deficits Decreased activity tolerance;Decreased balance;Abnormal gait;Decreased  endurance;Decreased mobility;Decreased strength;Difficulty walking;Postural dysfunction   Rehab Potential Good   PT Frequency Other (comment)   PT Duration Other (comment)   PT Treatment/Interventions ADLs/Self Care Home Management;Moist Heat;Gait training;Stair training;Functional mobility training;Therapeutic activities;Therapeutic exercise;Balance training;Neuromuscular re-education;Patient/family education;Manual techniques   PT Next Visit Plan Reassesss and likely DC next session. Give advanced HEP with emphasis on balance.    PT Home Exercise Plan no changes    Consulted and Agree with Plan of Care Patient        Problem List Patient Active Problem List   Diagnosis  Date Noted  . Abnormality of gait 11/17/2015  . Parkinson disease ([place]) 11/17/2015  . 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 Unsteady gait 10/15/2015  . Hearing loss in left ear 10/15/2015  . Osteoarthritis of both knees 08/27/2015  . Tremor 08/27/2015  . Depression 03/25/2015  . Anemia 01/31/2015  . Femoral neck fracture ([place]) 01/31/2015  . Faintness   . Fall at home 04/04/2014  . Multiple thyroid nodules 04/04/2014  . GERD (gastroesophageal reflux disease) 04/04/2014  . Postmenopausal atrophic vaginitis 03/12/2014  . Vitamin D deficiency 03/12/2014  . Osteopetrosis 10/23/2013  . Urinary incontinence, urge 10/23/2013  . Thoracic back pain 10/23/2013  . Lumbar back pain 10/23/2013  . Colon polyps 02/27/2012  . CAD, NATIVE VESSEL 12/12/2009  . Bilateral carotid bruits 12/12/2009  . ALLERGIC URTICARIA 09/09/2009  . Osteoarthrosis, unspecified whether generalized or localized, involving lower leg 04/19/2009  . Essential hypertension 10/16/2008  . FATIGUE 10/16/2008  . HEMORRHOIDS 03/29/2008  . INTERMITTENT VERTIGO 03/29/2008    Deniece Ree PT, DPT [place] [address] [place], [place], [address] Phone: [phone]   Fax:  [phone]  Name: Kerry Flynn MRN: [id number] Date of Birth: August 26, 1927

## 2016-02-02 ENCOUNTER — Telehealth (HOSPITAL_COMMUNITY): Payer: Self-pay | Admitting: Physical Therapy

## 2016-02-02 NOTE — Telephone Encounter (Signed)
She has been sick all weekend and needs to cx this apptment. NF 02/02/16

## 2016-02-03 ENCOUNTER — Encounter (HOSPITAL_COMMUNITY): Payer: Medicare Other | Admitting: Physical Therapy

## 2016-02-09 NOTE — Therapy (Addendum)
[place], [place], [address] Phone: [phone]   Fax:  [phone]  Patient Details  Name: Kerry Flynn MRN: [id number] Date of Birth: 1927-01-10 Referring Provider:  Fayrene Helper, MD  Encounter Date: 02/09/2016  PHYSICAL THERAPY DISCHARGE SUMMARY  Visits from Start of Care: 24  Current functional level related to goals / functional outcomes: Patient had been attending skilled PT services consistently for a significant period of time with no significant changes or improvement; it is important to note that at time of treatment she had not been taking parkinson's medication which could possibly have impacted her progress with skilled services, as PT did notice progressive changes when working with patient and did on multiple occasions educate patient regarding importance of working closely with MD to attempt to find appropriate medication balance in order to enhance function at home and with PT.    Remaining deficits: Reduced muscle strength, impaired balance, difficulty with mobility, poor posture, impaired gait, fall risk, knee pain, muscle stiffness    Education / Equipment: Importance of working with MD regarding parkinsons symptoms/medications, safety at home  Plan: Patient agrees to discharge.  Patient goals were partially met. Patient is being discharged due to lack of progress.  ?????       Deniece Ree PT, DPT [place] [address] [place], [place], [address] Phone: [phone]   Fax:  [phone]

## 2016-02-16 ENCOUNTER — Telehealth: Payer: Self-pay | Admitting: Family Medicine

## 2016-02-16 NOTE — Telephone Encounter (Signed)
Appointment scheduled on 04/06/16 at 1:45/appoinment card has been mailed to Kerry Flynn

## 2016-02-16 NOTE — Telephone Encounter (Signed)
pls call and sched an AWV in next 4 to 7 weeks, no appt in system

## 2016-03-17 ENCOUNTER — Encounter: Payer: Self-pay | Admitting: Adult Health

## 2016-03-17 ENCOUNTER — Ambulatory Visit (INDEPENDENT_AMBULATORY_CARE_PROVIDER_SITE_OTHER): Payer: Medicare HMO | Admitting: Adult Health

## 2016-03-17 VITALS — BP 166/68 | HR 70 | Resp 18 | Ht 66.0 in | Wt 140.0 lb

## 2016-03-17 DIAGNOSIS — G20 Parkinson's disease: Secondary | ICD-10-CM | POA: Diagnosis not present

## 2016-03-17 NOTE — Progress Notes (Signed)
PATIENT: Kerry Flynn DOB: 09-14-1927  REASON FOR VISIT: follow up- Parkinson's features HISTORY FROM: patient  HISTORY OF PRESENT ILLNESS: Kerry Flynn is an 82 year old female with a history of Parkinson's features. She returns today for an evaluation. The patient states that overall her symptoms have remained the same. She continues to have a resting tremor that affects the left hand. She denies any trouble swallowing. She does report a lot of drooling. She does feel that her balance may have gotten worse. She states that she tends to fall backwards unless she is holding on to something. She does have more difficulty getting up out of a chair. She uses a Rollator when ambulating. Denies any recent falls. She states that she's been having muscle cramps in the lower extremities. This has been affecting her sleep. She states that she tried Sinemet in the past however caused her to feel worse. She does state that she believes she was taking the medication incorrectly. She increased to a whole tablet instead of a half a tablet. The patient reports that she would like to give this medication another chance. She returns today for an evaluation.  HISTORY 11/17/15: Kerry Flynn is an 82 year old right-handed white female with a history of a tremor that developed on the left hand about one year ago. The tremor has been primarily a resting tremor, and as time has gone on, the tremor has spread to the right side as well. 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 She has had increasing problems walking. She does have significant arthritis involving both knees, she has had a fall in February 2016 and fractured her left hip, requiring surgery. The patient uses a walker for ambulation. She has been using a walker for greater than a year, she was using a cane prior to that. The patient has also noted that she has had some troubles with drooling over the last 2 years. She lost  her husband in March 2016, she lives alone, but she has a caretaker coming in several days a week to help her. She still operates a Teacher, music. The patient is currently engaged in physical therapy. She has had some change in the voice is well, she has noted some problems with a raspy voice. She was seen by Dr. Lily Lovings, and he felt that she had Parkinson's disease, and she was started on Sinemet. She never got the prescription filled, she comes to this office for a second opinion.  REVIEW OF SYSTEMS: Out of a complete 14 system review of symptoms, the patient complains only of the following symptoms, and all other reviewed systems are negative.  Joint pain, joint swelling, back pain, aching muscles, muscle cramps, walking difficulty, neck pain, neck stiffness, restless leg, frequent waking, memory loss, dizziness, weakness, tremors  ALLERGIES: Allergies  Allergen Reactions  . Clarithromycin   . Flagyl [Metronidazole]   . Penicillins     HOME MEDICATIONS: Outpatient Prescriptions Prior to Visit  Medication Sig Dispense Refill  . acetaminophen (TYLENOL) 500 MG tablet Take 500 mg by mouth every 6 (six) hours as needed.    Marland Kitchen amLODipine (NORVASC) 5 MG tablet TAKE 1 TABLET (5 MG TOTAL) BY MOUTH DAILY. 30 tablet 3  . Calcium-Vitamin D-Vitamin K 500-100-40 MG-UNT-MCG CHEW Chew 1 tablet by mouth 2 (two) times daily.     Marland Kitchen docusate sodium (COLACE) 100 MG capsule Take 100 mg by mouth 3 (three) times daily as needed (constipation).     Marland Kitchen  Magnesium Oxide (PHILLIPS) 500 MG (LAX) TABS Take 1 tablet (500 mg total) by mouth daily as needed.  0  . meclizine (ANTIVERT) 12.5 MG tablet Take 1 tablet (12.5 mg total) by mouth as needed. vertigo 30 tablet 0  . meloxicam (MOBIC) 15 MG tablet Take 1 tablet (15 mg total) by mouth daily. 30 tablet 5  . mirabegron ER (MYRBETRIQ) 50 MG TB24 tablet Take 1 tablet (50 mg total) by mouth daily. 30 tablet 4  . Misc Natural Products (COSAMIN ASU ADVANCED FORMULA) CAPS Take 1  capsule by mouth 3 (three) times daily with meals.     . Multiple Vitamin (MULTIVITAMIN) tablet Take 1 tablet by mouth daily.      Marland Kitchen MYRBETRIQ 50 MG TB24 tablet TAKE 1 TABLET BY MOUTH DAILY 30 tablet 2  . nitroGLYCERIN (NITROSTAT) 0.4 MG SL tablet Place 0.4 mg under the tongue every 5 (five) minutes as needed. 1 tablet under the tongue at onset of chest pain: you may repeat every 5 minutes for up to 3 doses    . omeprazole (PRILOSEC) 40 MG capsule TAKE ONE CAPSULE BY MOUTH TWICE A DAY AS NEEDED 30 capsule 5  . Probiotic Product (HEALTHY COLON) CAPS Take 1 capsule by mouth daily.     . carbidopa-levodopa (SINEMET IR) 25-100 MG tablet Take 0.5 tablets by mouth 3 (three) times daily. (Patient not taking: Reported on 03/17/2016) 60 tablet 2  . Wheat Dextrin (BENEFIBER) POWD Take 2 scoop by mouth 2 (two) times daily. Reported on 03/17/2016    . Zoledronic Acid (RECLAST IV) Inject into the vein. Reported on 03/17/2016     No facility-administered medications prior to visit.    PAST MEDICAL HISTORY: Past Medical History  Diagnosis Date  . Hemorrhoids   . Intermittent vertigo   . Osteoporosis     knees  . Benign recurrent vertigo   . Vitamin D deficiency   . Head trauma     status post fall; upper and lower extremities   . GERD (gastroesophageal reflux disease)   . Diverticulosis   . Chronic constipation   . Dermatitis     recurrent  . Hypertension   . Barrett's esophagus     Dr. Laural Golden, Dr. Moshe Cipro  . Infertility, female   . Peripheral edema     chronic  . Chronic knee pain   . Fatigue     chronic  . Anemia   . Hip fracture, left ([place])     s/p left arthroplasty 2/16  . Osteoarthritis     of the neck  . DDD (degenerative disc disease), lumbar   . DDD (degenerative disc disease), cervical   . Varicose veins   . Hemorrhoid   . Stable angina ([facility])   . Colon polyps   . Abnormality of gait 11/17/2015  . Parkinson disease ([place]) 11/17/2015  . Dropped head syndrome 11/17/2015     PAST SURGICAL HISTORY: Past Surgical History  Procedure Laterality Date  . Knee arthroscopy Right 1999  . Knee arthroscopy Left 1996  . Cholecystectomy, laparoscopic  06/17/2006    with stones  . Rotator cuff repair  05/14/2010  . Cataract extraction w/ intraocular lens implant Left 01/06/2012    left, [facility]  . Eye surgery  01/24/2012    bilateral cataract extraction  . Colonoscopy  04/19/2012    Procedure: COLONOSCOPY;  Surgeon: Rogene Houston, MD;  Location: [facility];  Service: Endoscopy;  Laterality: N/A;  1200  . Tonsillectomy and adenoidectomy  1934  .  Palate surgery  1988  . Dental implant   09/2014  . Hip arthroplasty Left 01/31/2015    Procedure: ARTHROPLASTY BIPOLAR HIP;  Surgeon: Carole Civil, MD;  Location: [facility];  Service: Orthopedics;  Laterality: Left;  . Colonoscopy w/ polypectomy  05/29/1999, 01/12/2012  . Esophagogastroduodenoscopy  01/12/2012  . Upper gi endoscopy  12/12/2006    with biopsy, with colonoscopy - dx: Barrett's disease  . Transthoracic echocardiogram  01/2012    EF=>55%; mild mitral annular calcification & mild MR; trace TR; trace pulm valve regurg  . Nm myocar perf wall motion  02/2012    lexiscan - small, fized apical lateral bowel attenuation artifact; no reversible ischemia; EF 77%; non-diagnostic for ischemia; low risk  . Cardiac catheterization  09/02/2005    mild-mod calcification in mid LAD (20-30% luminal obstruction), otherwise normal coronaries, patent LE arteries (Dr. Jackie Plum)  . Carotid doppler  02/2008    R & L ICAs 0-49% diameter reduction  . Cholecystectomy    . Tonsillectomy    . Colonoscopy N/A 09/25/2015    Procedure: COLONOSCOPY;  Surgeon: Rogene Houston, MD;  Location: [facility];  Service: Endoscopy;  Laterality: N/A;  100    FAMILY HISTORY: Family History  Problem Relation Age of Onset  . Parkinsonism Sister   . Diabetes Mother   . Hypertension Mother     cva  . CVA Mother   . Kidney disease Mother   .  Heart failure Mother   . Colon cancer Neg Hx   . Cancer Father     larengeal  . Heart failure Father     CHF  . Heart disease Father   . Osteoporosis Sister   . Stroke Maternal Grandmother   . Stroke Maternal Grandfather     SOCIAL HISTORY: Social History   Social History  . Marital Status: Widowed    Spouse Name: N/A  . Number of Children: 0  . Years of Education: B.S.   Occupational History  . retired   .     Social History Main Topics  . Smoking status: Former Smoker -- 0.25 packs/day for .5 years  . Smokeless tobacco: Never Used  . Alcohol Use: No     Comment: occassionally wine with a meal  . Drug Use: No  . Sexual Activity: Not Currently   Other Topics Concern  . Not on file   Social History Narrative   Patient drinks 2-3 cups of caffeine daily.   Patient is right handed.       PHYSICAL EXAM  Filed Vitals:   03/17/16 1137  BP: 166/68  Pulse: 70  Resp: 18  Height: 5\' 6"  (1.676 m)  Weight: 140 lb (63.504 kg)   Body mass index is 22.61 kg/(m^2).  Generalized: Well developed, in no acute distress   Neurological examination  Mentation: Alert oriented to time, place, history taking. Follows all commands speech and language fluent Cranial nerve II-XII: Pupils were equal round reactive to light. Extraocular movements were full, visual field were full on confrontational test. Facial sensation and strength were normal. Uvula tongue midline. Dropped head. Limited range of motion of the neck. Motor: The motor testing reveals 5 over 5 strength of all 4 extremities. Good symmetric motor tone is noted throughout. Resting tremor noted in the left hand. Moderate impairment of finger taps on the left. Sensory: Sensory testing is intact to soft touch on all 4 extremities. No evidence of extinction is noted.  Coordination: Cerebellar testing reveals  good finger-nose-finger and heel-to-shin bilaterally.  Gait and station: Patient requires assistance when standing from  a sitting position. She uses a Rollator when in the evening. Tandem gait not attempted. Reflexes: Deep tendon reflexes are symmetric and normal bilaterally.   DIAGNOSTIC DATA (LABS, IMAGING, TESTING) - I reviewed patient records, labs, notes, testing and imaging myself where available.  Lab Results  Component Value Date   WBC 6.2 05/05/2015   HGB 11.0* 05/05/2015   HCT 33.9* 05/05/2015   MCV 93.1 05/05/2015   PLT 215 05/05/2015      Component Value Date/Time   NA 136 05/05/2015 1556   K 5.3 05/05/2015 1556   CL 99 05/05/2015 1556   CO2 26 05/05/2015 1556   GLUCOSE 111* 05/05/2015 1556   BUN 34* 05/05/2015 1556   CREATININE 0.96 05/05/2015 1556   CREATININE 0.78 02/20/2015 0633   CALCIUM 9.4 05/05/2015 1556   PROT 6.5 02/20/2015 0633   ALBUMIN 3.2* 02/20/2015 0633   AST 26 02/20/2015 0633   ALT 28 02/20/2015 0633   ALKPHOS 65 02/20/2015 0633   BILITOT 0.4 02/20/2015 0633   GFRNONAA 74* 02/20/2015 0633   GFRNONAA 65 12/31/2014 1236   GFRAA 86* 02/20/2015 0633   GFRAA 74 12/31/2014 1236   Lab Results  Component Value Date   CHOL 149 12/31/2014   HDL 64 12/31/2014   LDLCALC 72 12/31/2014   TRIG 67 12/31/2014   CHOLHDL 2.3 12/31/2014   No results found for: HGBA1C Lab Results  Component Value Date   VITAMINB12 1169* 01/31/2015   Lab Results  Component Value Date   TSH 2.065 01/30/2015      ASSESSMENT AND PLAN 82 y.o. year old female  has a past medical history of Hemorrhoids; Intermittent vertigo; Osteoporosis; Benign recurrent vertigo; Vitamin D deficiency; Head trauma; GERD (gastroesophageal reflux disease); Diverticulosis; Chronic constipation; Dermatitis; Hypertension; Barrett's esophagus; Infertility, female; Peripheral edema; Chronic knee pain; Fatigue; Anemia; Hip fracture, left ([place]); Osteoarthritis; DDD (degenerative disc disease), lumbar; DDD (degenerative disc disease), cervical; Varicose veins; Hemorrhoid; Stable angina ([place]); Colon polyps; Abnormality  of gait (11/17/2015); Parkinson disease ([place]) (11/17/2015); and Dropped head syndrome (11/17/2015). here with:  1. Parkinsonism  The patient continues to have a resting tremor and difficulty with gait and balance. She would like to try Sinemet again. She will begin by taking 25-100 milligrams half a tablet twice a day for 2 weeks then increase to half a tablet 3 times a day thereafter. Patient advised that if she is unable to tolerate this medication she should let us know. She will follow-up in 6 months with Dr. Lucio Edward, MSN, NP-C 03/17/2016, 11:43 AM [facility] [address] [place], [place] [address] [phone]

## 2016-03-17 NOTE — Progress Notes (Signed)
I have read the note, and I agree with the clinical assessment and plan.  Marciel Offenberger KEITH   

## 2016-03-17 NOTE — Patient Instructions (Signed)
Try Sinemet again:  Take 1/2 tablet twice a day for 2 weeks then increase to 1/2 tablet three times a day thereafter If your symptoms worsen or you develop new symptoms please let us know.

## 2016-03-24 ENCOUNTER — Other Ambulatory Visit: Payer: Self-pay | Admitting: Family Medicine

## 2016-03-29 ENCOUNTER — Encounter (HOSPITAL_COMMUNITY): Payer: Self-pay

## 2016-03-30 DIAGNOSIS — I739 Peripheral vascular disease, unspecified: Secondary | ICD-10-CM | POA: Diagnosis not present

## 2016-04-06 ENCOUNTER — Ambulatory Visit (INDEPENDENT_AMBULATORY_CARE_PROVIDER_SITE_OTHER): Payer: Medicare HMO | Admitting: Family Medicine

## 2016-04-06 ENCOUNTER — Encounter: Payer: Self-pay | Admitting: Family Medicine

## 2016-04-06 VITALS — BP 124/68 | HR 70 | Resp 18 | Ht 66.0 in | Wt 144.0 lb

## 2016-04-06 DIAGNOSIS — E049 Nontoxic goiter, unspecified: Secondary | ICD-10-CM

## 2016-04-06 DIAGNOSIS — M79641 Pain in right hand: Secondary | ICD-10-CM | POA: Diagnosis not present

## 2016-04-06 DIAGNOSIS — Z0000 Encounter for general adult medical examination without abnormal findings: Secondary | ICD-10-CM

## 2016-04-06 DIAGNOSIS — M79642 Pain in left hand: Secondary | ICD-10-CM

## 2016-04-06 DIAGNOSIS — I10 Essential (primary) hypertension: Secondary | ICD-10-CM

## 2016-04-06 DIAGNOSIS — M542 Cervicalgia: Secondary | ICD-10-CM

## 2016-04-06 DIAGNOSIS — E559 Vitamin D deficiency, unspecified: Secondary | ICD-10-CM

## 2016-04-06 DIAGNOSIS — D508 Other iron deficiency anemias: Secondary | ICD-10-CM

## 2016-04-06 NOTE — Patient Instructions (Addendum)
F/u in 4 month, with MMSE, call if you need me before  X ray of C spine this week  Please get fasting CBC, lipid, cmp , TSh, and Vit D first week in May  You are  Referred for [place] of thyroid gland, and to a rheumatologist   Careful not to fall  Fall Prevention in the Home  Falls can cause injuries. They can happen to people of all ages. There are many things you can do to make your home safe and to help prevent falls.  WHAT CAN I DO ON THE OUTSIDE OF MY HOME?  Regularly fix the edges of walkways and driveways and fix any cracks.  Remove anything that might make you trip as you walk through a door, such as a raised step or threshold.  Trim any bushes or trees on the path to your home.  Use bright outdoor lighting.  Clear any walking paths of anything that might make someone trip, such as rocks or tools.  Regularly check to see if handrails are loose or broken. Make sure that both sides of any steps have handrails.  Any raised decks and porches should have guardrails on the edges.  Have any leaves, snow, or ice cleared regularly.  Use sand or salt on walking paths during winter.  Clean up any spills in your garage right away. This includes oil or grease spills. WHAT CAN I DO IN THE BATHROOM?   Use night lights.  Install grab bars by the toilet and in the tub and shower. Do not use towel bars as grab bars.  Use non-skid mats or decals in the tub or shower.  If you need to sit down in the shower, use a plastic, non-slip stool.  Keep the floor dry. Clean up any water that spills on the floor as soon as it happens.  Remove soap buildup in the tub or shower regularly.  Attach bath mats securely with double-sided non-slip rug tape.  Do not have throw rugs and other things on the floor that can make you trip. WHAT CAN I DO IN THE BEDROOM?  Use night lights.  Make sure that you have a light by your bed that is easy to reach.  Do not use any sheets or blankets that are  too big for your bed. They should not hang down onto the floor.  Have a firm chair that has side arms. You can use this for support while you get dressed.  Do not have throw rugs and other things on the floor that can make you trip. WHAT CAN I DO IN THE KITCHEN?  Clean up any spills right away.  Avoid walking on wet floors.  Keep items that you use a lot in easy-to-reach places.  If you need to reach something above you, use a strong step stool that has a grab bar.  Keep electrical cords out of the way.  Do not use floor polish or wax that makes floors slippery. If you must use wax, use non-skid floor wax.  Do not have throw rugs and other things on the floor that can make you trip. WHAT CAN I DO WITH MY STAIRS?  Do not leave any items on the stairs.  Make sure that there are handrails on both sides of the stairs and use them. Fix handrails that are broken or loose. Make sure that handrails are as long as the stairways.  Check any carpeting to make sure that it is firmly attached to the  stairs. Fix any carpet that is loose or worn.  Avoid having throw rugs at the top or bottom of the stairs. If you do have throw rugs, attach them to the floor with carpet tape.  Make sure that you have a light switch at the top of the stairs and the bottom of the stairs. If you do not have them, ask someone to add them for you. WHAT ELSE CAN I DO TO HELP PREVENT FALLS?  Wear shoes that:  Do not have high heels.  Have rubber bottoms.  Are comfortable and fit you well.  Are closed at the toe. Do not wear sandals.  If you use a stepladder:  Make sure that it is fully opened. Do not climb a closed stepladder.  Make sure that both sides of the stepladder are locked into place.  Ask someone to hold it for you, if possible.  Clearly mark and make sure that you can see:  Any grab bars or handrails.  First and last steps.  Where the edge of each step is.  Use tools that help you move  around (mobility aids) if they are needed. These include:  Canes.  Walkers.  Scooters.  Crutches.  Turn on the lights when you go into a dark area. Replace any light bulbs as soon as they burn out.  Set up your furniture so you have a clear path. Avoid moving your furniture around.  If any of your floors are uneven, fix them.  If there are any pets around you, be aware of where they are.  Review your medicines with your doctor. Some medicines can make you feel dizzy. This can increase your chance of falling. Ask your doctor what other things that you can do to help prevent falls.   This information is not intended to replace advice given to you by your health care provider. Make sure you discuss any questions you have with your health care provider.   Document Released: 10/09/2009 Document Revised: 04/29/2015 Document Reviewed: 01/17/2015 Elsevier Interactive Patient Education [employer].

## 2016-04-06 NOTE — Progress Notes (Signed)
Subjective:    Patient ID: Kerry Flynn, female    DOB: Jul 16, 1927, 82 y.o.   MRN: [id number]  HPI  Preventive Screening-Counseling & Management   Patient present here today for a Medicare annual wellness visit. C/o increased cracking in neck up to the base of her skull which worries her C/o increased deformity of fingers with nodules and difficulty with grip and carrying out some of her ADL's   Current Problems (verified)   Medications Prior to Visit Allergies (verified)   PAST HISTORY  Family History (updated)  Social History Retired from physical therapy; widowed with no children supported by nieces    Risk Factors  Current exercise habits:  Limited due to mobility   Dietary issues discussed:  Heart healthy diet    Cardiac risk factors:   Depression Screen  (Note: if answer to either of the following is "Yes", a more complete depression screening is indicated)   Over the past two weeks, have you felt down, depressed or hopeless? No  Over the past two weeks, have you felt little interest or pleasure in doing things? No  Have you lost interest or pleasure in daily life? No  Do you often feel hopeless? No  Do you cry easily over simple problems? No   Activities of Daily Living  In your present state of health, do you have any difficulty performing the following activities?  Driving?:  Yes, transported by friends and family  Managing money?: No, but assisted by niece  Feeding yourself?:No Getting from bed to chair?: Use of walker  Climbing a flight of stairs?: Use of walker  Preparing food and eating?:No, does eat out mostly  Bathing or showering?: Yes use of walker Getting dressed?:No Getting to the toilet?: Use of walker  Using the toilet?:No Moving around from place to place?: Use of walker   Fall Risk Assessment In the past year have you fallen or had a near fall?:No Are you currently taking any medications that make you dizzy?:No   Hearing  Difficulties:  Yes, and has seen audiologist for eval but does not qualify for hearing aids  Do you often ask people to speak up or repeat themselves?: Yes  Do you experience ringing or noises in your ears?:No Do you have difficulty understanding soft or whispered voices?: Yes   Cognitive Testing  Alert? Yes Normal Appearance?Yes  Oriented to person? Yes Place? Yes  Time? Yes  Displays appropriate judgment?Yes  Can read the correct time from a watch face? yes Are you having problems remembering things?No age appropriate  Advanced Directives have been discussed with the patient?Yes and  Directives in place    List the Names of Other Physician/Practitioners you currently use: careteams up to date    Indicate any recent Medical Services you may have received from other than [facility] providers in the past year (date may be approximate).   Assessment:    Annual Wellness Exam   Plan:     Medicare Attestation  I have personally reviewed:  The patient's medical and social history  Their use of alcohol, tobacco or illicit drugs  Their current medications and supplements  The patient's functional ability including ADLs,fall risks, home safety risks, cognitive, and hearing and visual impairment  Diet and physical activities  Evidence for depression or mood disorders  The patient's weight, height, BMI, and visual acuity have been recorded in the chart. I have made referrals, counseling, and provided education to the patient based on review of the above  and I have provided the patient with a written personalized care plan for preventive services.     Review of Systems     Objective:   Physical Exam  BP 124/68 mmHg  Pulse 70  Resp 18  Ht 5\' 6"  (1.676 m)  Wt 144 lb 0.6 oz (65.336 kg)  BMI 23.26 kg/m2  SpO2 100%  MS: Decreased ROM c spine Deformity of fingers with nodules and poor grip        Assessment & Plan:   Medicare annual wellness visit, subsequent Annual exam as  documented. Counseling done  re healthy lifestyle involving commitment to 150 minutes exercise per week, heart healthy diet, and attaining healthy weight.The importance of adequate sleep also discussed. Regular seat belt use and home safety, is also discussed. Changes in health habits are decided on by the patient with goals and time frames  set for achieving them. Immunization and cancer screening needs are specifically addressed at this visit.   Neck pain Increased pain and stiffness with cracking x months, obtain x ray of c spine to help to determine extent of arthritis  Bilateral hand pain Worsening bilateral hand pain and stiffness with joint deformity and nodules, also reduced function of hands Rheumatology to evaluate

## 2016-04-08 ENCOUNTER — Ambulatory Visit (HOSPITAL_COMMUNITY)
Admission: RE | Admit: 2016-04-08 | Discharge: 2016-04-08 | Disposition: A | Discharge status: Home / Self Care | Payer: Medicare HMO | Source: Ambulatory Visit | Attending: Family Medicine | Admitting: Family Medicine

## 2016-04-08 DIAGNOSIS — M542 Cervicalgia: Secondary | ICD-10-CM | POA: Diagnosis not present

## 2016-04-08 DIAGNOSIS — M50321 Other cervical disc degeneration at C4-C5 level: Secondary | ICD-10-CM | POA: Diagnosis not present

## 2016-04-08 DIAGNOSIS — M50322 Other cervical disc degeneration at C5-C6 level: Secondary | ICD-10-CM | POA: Diagnosis not present

## 2016-04-11 DIAGNOSIS — M542 Cervicalgia: Secondary | ICD-10-CM | POA: Insufficient documentation

## 2016-04-11 DIAGNOSIS — Z0000 Encounter for general adult medical examination without abnormal findings: Secondary | ICD-10-CM | POA: Insufficient documentation

## 2016-04-11 DIAGNOSIS — M19049 Primary osteoarthritis, unspecified hand: Secondary | ICD-10-CM | POA: Insufficient documentation

## 2016-04-11 NOTE — Assessment & Plan Note (Signed)

## 2016-04-11 NOTE — Assessment & Plan Note (Signed)
Increased pain and stiffness with cracking x months, obtain x ray of c spine to help to determine extent of arthritis

## 2016-04-11 NOTE — Assessment & Plan Note (Signed)
Worsening bilateral hand pain and stiffness with joint deformity and nodules, also reduced function of hands Rheumatology to evaluate

## 2016-04-14 ENCOUNTER — Ambulatory Visit (HOSPITAL_COMMUNITY)
Admission: RE | Admit: 2016-04-14 | Discharge: 2016-04-14 | Disposition: A | Discharge status: Home / Self Care | Payer: Medicare HMO | Source: Ambulatory Visit | Attending: Family Medicine | Admitting: Family Medicine

## 2016-04-14 DIAGNOSIS — E049 Nontoxic goiter, unspecified: Secondary | ICD-10-CM | POA: Diagnosis present

## 2016-04-14 DIAGNOSIS — E042 Nontoxic multinodular goiter: Secondary | ICD-10-CM | POA: Diagnosis not present

## 2016-04-25 ENCOUNTER — Other Ambulatory Visit: Payer: Self-pay | Admitting: Family Medicine

## 2016-04-28 DIAGNOSIS — M7531 Calcific tendinitis of right shoulder: Secondary | ICD-10-CM | POA: Diagnosis not present

## 2016-04-28 DIAGNOSIS — M25561 Pain in right knee: Secondary | ICD-10-CM | POA: Diagnosis not present

## 2016-04-28 DIAGNOSIS — M542 Cervicalgia: Secondary | ICD-10-CM | POA: Diagnosis not present

## 2016-04-28 DIAGNOSIS — M1990 Unspecified osteoarthritis, unspecified site: Secondary | ICD-10-CM | POA: Diagnosis not present

## 2016-04-28 DIAGNOSIS — M25562 Pain in left knee: Secondary | ICD-10-CM | POA: Diagnosis not present

## 2016-04-28 DIAGNOSIS — M7532 Calcific tendinitis of left shoulder: Secondary | ICD-10-CM | POA: Diagnosis not present

## 2016-05-05 ENCOUNTER — Other Ambulatory Visit: Payer: Self-pay | Admitting: Family Medicine

## 2016-05-05 DIAGNOSIS — D649 Anemia, unspecified: Secondary | ICD-10-CM | POA: Diagnosis not present

## 2016-05-05 DIAGNOSIS — E559 Vitamin D deficiency, unspecified: Secondary | ICD-10-CM | POA: Diagnosis not present

## 2016-05-05 DIAGNOSIS — D508 Other iron deficiency anemias: Secondary | ICD-10-CM | POA: Diagnosis not present

## 2016-05-05 DIAGNOSIS — I10 Essential (primary) hypertension: Secondary | ICD-10-CM | POA: Diagnosis not present

## 2016-05-06 LAB — LIPID PANEL
CHOL/HDL RATIO: 2.4 ratio (ref <=5.0)
Cholesterol: 151 mg/dL (ref 125–200)
HDL: 62 mg/dL (ref >=46)
LDL CALC: 78 mg/dL (ref <130)
Triglycerides: 57 mg/dL (ref <150)
VLDL: 11 mg/dL (ref <30)

## 2016-05-06 LAB — TSH: TSH: 2.74 m[IU]/L

## 2016-05-06 LAB — CBC
HEMATOCRIT: 30.6 % — AB (ref 35.0–45.0)
HEMOGLOBIN: 10.2 g/dL — AB (ref 11.7–15.5)
MCH: 31 pg (ref 27.0–33.0)
MCHC: 33.3 g/dL (ref 32.0–36.0)
MCV: 93 fL (ref 80.0–100.0)
MPV: 11.3 fL (ref 7.5–12.5)
PLATELETS: 155 10*3/uL (ref 140–400)
RBC: 3.29 MIL/uL — AB (ref 3.80–5.10)
RDW: 13.4 % (ref 11.0–15.0)
WBC: 5.9 10*3/uL (ref 3.8–10.8)

## 2016-05-06 LAB — IRON AND TIBC
%SAT: 23 % (ref 11–50)
Iron: 77 ug/dL (ref 45–160)
TIBC: 338 ug/dL (ref 250–450)
UIBC: 261 ug/dL (ref 125–400)

## 2016-05-06 LAB — COMPREHENSIVE METABOLIC PANEL
ALT: 5 U/L — ABNORMAL LOW (ref 6–29)
AST: 20 U/L (ref 10–35)
Albumin: 4.1 g/dL (ref 3.6–5.1)
Alkaline Phosphatase: 59 U/L (ref 33–130)
BUN: 29 mg/dL — AB (ref 7–25)
CHLORIDE: 104 mmol/L (ref 98–110)
CO2: 27 mmol/L (ref 20–31)
Calcium: 9.3 mg/dL (ref 8.6–10.4)
Creat: 1.04 mg/dL — ABNORMAL HIGH (ref 0.60–0.88)
Glucose, Bld: 90 mg/dL (ref 65–99)
POTASSIUM: 4.7 mmol/L (ref 3.5–5.3)
Sodium: 139 mmol/L (ref 135–146)
TOTAL PROTEIN: 6.7 g/dL (ref 6.1–8.1)
Total Bilirubin: 0.3 mg/dL (ref 0.2–1.2)

## 2016-05-06 LAB — VITAMIN D 25 HYDROXY (VIT D DEFICIENCY, FRACTURES): VIT D 25 HYDROXY: 32 ng/mL (ref 30–100)

## 2016-05-06 LAB — FOLATE

## 2016-05-06 LAB — FERRITIN: Ferritin: 49 ng/mL (ref 20–288)

## 2016-05-06 LAB — VITAMIN B12: VITAMIN B 12: 611 pg/mL (ref 200–1100)

## 2016-06-15 DIAGNOSIS — I739 Peripheral vascular disease, unspecified: Secondary | ICD-10-CM | POA: Diagnosis not present

## 2016-06-17 ENCOUNTER — Other Ambulatory Visit: Payer: Self-pay | Admitting: Neurology

## 2016-06-23 ENCOUNTER — Other Ambulatory Visit: Payer: Self-pay | Admitting: Family Medicine

## 2016-07-21 DIAGNOSIS — M1711 Unilateral primary osteoarthritis, right knee: Secondary | ICD-10-CM | POA: Diagnosis not present

## 2016-07-21 DIAGNOSIS — M1712 Unilateral primary osteoarthritis, left knee: Secondary | ICD-10-CM | POA: Diagnosis not present

## 2016-07-21 DIAGNOSIS — M542 Cervicalgia: Secondary | ICD-10-CM | POA: Diagnosis not present

## 2016-07-21 DIAGNOSIS — L309 Dermatitis, unspecified: Secondary | ICD-10-CM | POA: Diagnosis not present

## 2016-07-21 DIAGNOSIS — M19042 Primary osteoarthritis, left hand: Secondary | ICD-10-CM | POA: Diagnosis not present

## 2016-07-21 DIAGNOSIS — M19041 Primary osteoarthritis, right hand: Secondary | ICD-10-CM | POA: Diagnosis not present

## 2016-08-05 ENCOUNTER — Ambulatory Visit (INDEPENDENT_AMBULATORY_CARE_PROVIDER_SITE_OTHER): Payer: Medicare HMO | Admitting: Family Medicine

## 2016-08-05 ENCOUNTER — Encounter: Payer: Self-pay | Admitting: Family Medicine

## 2016-08-05 VITALS — BP 144/70 | HR 79 | Resp 16 | Ht 66.0 in | Wt 139.4 lb

## 2016-08-05 DIAGNOSIS — E042 Nontoxic multinodular goiter: Secondary | ICD-10-CM

## 2016-08-05 DIAGNOSIS — R251 Tremor, unspecified: Secondary | ICD-10-CM

## 2016-08-05 DIAGNOSIS — I10 Essential (primary) hypertension: Secondary | ICD-10-CM | POA: Diagnosis not present

## 2016-08-05 DIAGNOSIS — G20 Parkinson's disease: Secondary | ICD-10-CM | POA: Diagnosis not present

## 2016-08-05 DIAGNOSIS — K219 Gastro-esophageal reflux disease without esophagitis: Secondary | ICD-10-CM | POA: Diagnosis not present

## 2016-08-05 DIAGNOSIS — N3941 Urge incontinence: Secondary | ICD-10-CM

## 2016-08-05 DIAGNOSIS — M159 Polyosteoarthritis, unspecified: Secondary | ICD-10-CM

## 2016-08-05 MED ORDER — OXYBUTYNIN CHLORIDE ER 10 MG PO TB24: 10.0000 mg | ORAL_TABLET | Freq: Every day | ORAL | 3 refills | Status: DC

## 2016-08-05 NOTE — Patient Instructions (Addendum)
F/U in 4 month, call if you need me before  New medication  for wetting on yourself is sent in as you requested  Be careful not to fall, use the lift for all the stairs

## 2016-08-07 NOTE — Assessment & Plan Note (Signed)
Controlled, no change in medication  

## 2016-08-07 NOTE — Assessment & Plan Note (Signed)
changed to alternative medication  fpor cost containment per pt request. Still requires use of incontinence supplies at bedtime

## 2016-08-07 NOTE — Assessment & Plan Note (Signed)
Improved on treatment for Parkinson's

## 2016-08-07 NOTE — Assessment & Plan Note (Signed)
Controlled, no change in medication DASH diet and commitment to daily physical activity for a minimum of 30 minutes discussed and encouraged, as a part of hypertension management. The importance of attaining a healthy weight is also discussed.  BP/Weight 08/05/2016 04/06/2016 03/17/2016 11/17/2015 10/15/2015 09/25/2015 0000000  Systolic BP [id number]  Diastolic BP 70 68 68 68 60 47 68  Wt. (Lbs) 139.4 144.04 140 140.5 140.08 141 141.04  BMI 22.5 23.26 22.61 22.69 22.62 22.77 [id number]  Though systolic pressure is high, intolerant of additional med an d at increased fall risk, so no med change

## 2016-08-07 NOTE — Assessment & Plan Note (Signed)
Unchanged on imaging study in 04/2015, asymptomatic

## 2016-08-07 NOTE — Assessment & Plan Note (Signed)
Being treated by neurology, and reports improvement in temor

## 2016-08-07 NOTE — Progress Notes (Signed)
   Kerry Flynn     MRN: [id number]      DOB: 09-Jan-1927   HPI Kerry Flynn is here for follow up and re-evaluation of chronic medical conditions, medication management and review of any available recent lab and radiology data.  Preventive health is updated, specifically   Immunization.   Questions or concerns regarding consultations or procedures which the PT has had in the interim are  Addressed.Is currently being followed by neurology and rheumatology The PT denies any adverse reactions to current medications since the last visit. States wants to change to lower cost med for incontinence Denies falls, happy with current living situation  ROS Denies recent fever or chills. Denies sinus pressure, nasal congestion, ear pain or sore throat. Denies chest congestion, productive cough or wheezing. Denies chest pains, palpitations and leg swelling Denies abdominal pain, nausea, vomiting,diarrhea or constipation.   Denies dysuria, frequency, hesitancy or incontinence. C/o  joint pain, swelling and limitation in mobility. Denies headaches, seizures, numbness, or tingling. Denies depression, anxiety or insomnia. Denies skin break down or rash.   PE  BP (!) 144/70   Pulse 79   Resp 16   Ht 5\' 6"  (1.676 m)   Wt 139 lb 6.4 oz (63.2 kg)   SpO2 100%   BMI 22.50 kg/m   Patient alert and oriented and in no cardiopulmonary distress.  HEENT: No facial asymmetry, EOMI,   oropharynx pink and moist.  Neck decreased ROM no JVD, no mass.  Chest: Clear to auscultation bilaterally.  CVS: S1, S2 no murmurs, no S3.Regular rate.  ABD: Soft non tender.   Ext: No edema  MS: decreased  ROM spine, shoulders, hips and knees.  Skin: Intact, no ulcerations or rash noted.  Psych: Good eye contact, normal affect. Memory intact not anxious or depressed appearing.  CNS: CN 2-12 intact, power,  normal throughout.no focal deficits noted.   Assessment & Plan  Essential hypertension Controlled, no  change in medication DASH diet and commitment to daily physical activity for a minimum of 30 minutes discussed and encouraged, as a part of hypertension management. The importance of attaining a healthy weight is also discussed.  BP/Weight 08/05/2016 04/06/2016 03/17/2016 11/17/2015 10/15/2015 09/25/2015 0000000  Systolic BP 123456 [id number] [id number] 0000000  Diastolic BP 70 68 68 68 60 47 68  Wt. (Lbs) 139.4 144.04 140 140.5 140.08 141 141.04  BMI 22.5 23.26 22.61 22.69 22.62 22.77 [id number]  Though systolic pressure is high, intolerant of additional med an d at increased fall risk, so no med change     Parkinson disease ([place]) Being treated by neurology, and reports improvement in temor  Tremor Improved on treatment for Parkinson's  GERD (gastroesophageal reflux disease) Controlled, no change in medication   Urinary incontinence, urge changed to alternative medication  fpor cost containment per pt request. Still requires use of incontinence supplies at bedtime  Osteoarthritis, generalized Controlled, no change in medication   Multiple thyroid nodules Unchanged on imaging study in 04/2015, asymptomatic

## 2016-09-07 ENCOUNTER — Encounter (INDEPENDENT_AMBULATORY_CARE_PROVIDER_SITE_OTHER): Payer: Self-pay | Admitting: Internal Medicine

## 2016-09-07 ENCOUNTER — Encounter (HOSPITAL_COMMUNITY): Payer: Self-pay | Admitting: Emergency Medicine

## 2016-09-07 ENCOUNTER — Emergency Department (HOSPITAL_COMMUNITY): Payer: Medicare HMO

## 2016-09-07 ENCOUNTER — Emergency Department (HOSPITAL_COMMUNITY)
Admission: EM | Admit: 2016-09-07 | Discharge: 2016-09-07 | Disposition: A | Discharge status: Home / Self Care | Payer: Medicare HMO | Attending: Emergency Medicine | Admitting: Emergency Medicine

## 2016-09-07 DIAGNOSIS — M25559 Pain in unspecified hip: Secondary | ICD-10-CM | POA: Diagnosis not present

## 2016-09-07 DIAGNOSIS — W231XXA Caught, crushed, jammed, or pinched between stationary objects, initial encounter: Secondary | ICD-10-CM | POA: Diagnosis not present

## 2016-09-07 DIAGNOSIS — M25461 Effusion, right knee: Secondary | ICD-10-CM | POA: Diagnosis not present

## 2016-09-07 DIAGNOSIS — M25561 Pain in right knee: Secondary | ICD-10-CM

## 2016-09-07 DIAGNOSIS — R102 Pelvic and perineal pain: Secondary | ICD-10-CM | POA: Diagnosis not present

## 2016-09-07 DIAGNOSIS — M25551 Pain in right hip: Secondary | ICD-10-CM | POA: Insufficient documentation

## 2016-09-07 DIAGNOSIS — Z79899 Other long term (current) drug therapy: Secondary | ICD-10-CM | POA: Insufficient documentation

## 2016-09-07 DIAGNOSIS — R52 Pain, unspecified: Secondary | ICD-10-CM | POA: Diagnosis not present

## 2016-09-07 DIAGNOSIS — Y929 Unspecified place or not applicable: Secondary | ICD-10-CM | POA: Diagnosis not present

## 2016-09-07 DIAGNOSIS — Y999 Unspecified external cause status: Secondary | ICD-10-CM | POA: Insufficient documentation

## 2016-09-07 DIAGNOSIS — Z87891 Personal history of nicotine dependence: Secondary | ICD-10-CM | POA: Insufficient documentation

## 2016-09-07 DIAGNOSIS — I10 Essential (primary) hypertension: Secondary | ICD-10-CM | POA: Insufficient documentation

## 2016-09-07 DIAGNOSIS — G20 Parkinson's disease: Secondary | ICD-10-CM | POA: Insufficient documentation

## 2016-09-07 DIAGNOSIS — Y9389 Activity, other specified: Secondary | ICD-10-CM | POA: Insufficient documentation

## 2016-09-07 DIAGNOSIS — G8929 Other chronic pain: Secondary | ICD-10-CM | POA: Diagnosis not present

## 2016-09-07 MED ORDER — METHOCARBAMOL 500 MG PO TABS
500.0000 mg | ORAL_TABLET | Freq: Once | ORAL | Status: AC
  Administered 2016-09-07: 500 mg via ORAL
  Filled 2016-09-07: qty 1

## 2016-09-07 NOTE — ED Notes (Signed)
PT able to bear weight on both legs transferring from bed to wheelchair at this time.

## 2016-09-07 NOTE — ED Triage Notes (Signed)
EMS reported pt was trying to get on her lift chair this morning to go down the stairs and got stuck inbetween the chair and wall in a bent over position and had to hit life alert button for assistance. PT c/o right groin pain and right knee pain soreness since this am. PT c/o pain on weight bearing.

## 2016-09-07 NOTE — ED Provider Notes (Signed)
[facility] DEPT Provider Note   CSN: [id number] Arrival date & time: 09/07/16  0827     History   Chief Complaint Chief Complaint  Patient presents with  . Knee Pain    HPI Kerry Flynn is a 82 y.o. female.  Pt reports she got stuck getting into stair lift.  Pt reports hip was wedged in.  Pt called 911 to get help.  Pt reports she has chronic right knee pain.  Pt reports pain is worse today   The history is provided by the patient. No language interpreter was used.  Knee Pain   This is a chronic problem. The current episode started more than 1 week ago. The problem occurs constantly. The problem has been gradually worsening. The pain is present in the right knee. The pain is moderate. She has tried nothing for the symptoms. The treatment provided no relief. Family history is significant for no gout.    Past Medical History:  Diagnosis Date  . Abnormality of gait 11/17/2015  . Anemia   . Barrett's esophagus    Dr. Laural Golden, Dr. Moshe Cipro  . Benign recurrent vertigo   . Chronic constipation   . Chronic knee pain   . Colon polyps   . DDD (degenerative disc disease), cervical   . DDD (degenerative disc disease), lumbar   . Dermatitis    recurrent  . Diverticulosis   . 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 Fatigue    chronic  . GERD (gastroesophageal reflux disease)   . Head trauma    status post fall; upper and lower extremities   . Hemorrhoid   . Hemorrhoids   . Hip fracture, left ([place])    s/p left arthroplasty 2/16  . Hypertension   . Infertility, female   . Intermittent vertigo   . Osteoarthritis    of the neck  . Osteoporosis    knees  . Parkinson disease ([place]) 11/17/2015  . Peripheral edema    chronic  . Stable angina ([facility])   . Varicose veins   . Vitamin D deficiency     Patient Active Problem List   Diagnosis Date Noted  . Arthritis pain, hand 04/11/2016  . Neck pain 04/11/2016  . Abnormality of gait 11/17/2015  . Parkinson disease ([place])  11/17/2015  . 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 Hearing loss in left ear 10/15/2015  . Osteoarthritis of both knees 08/27/2015  . Tremor 08/27/2015  . Anemia 01/31/2015  . Femoral neck fracture ([place]) 01/31/2015  . Multiple thyroid nodules 04/04/2014  . GERD (gastroesophageal reflux disease) 04/04/2014  . Postmenopausal atrophic vaginitis 03/12/2014  . Vitamin D deficiency 03/12/2014  . Osteopetrosis 10/23/2013  . Urinary incontinence, urge 10/23/2013  . Thoracic back pain 10/23/2013  . Lumbar back pain 10/23/2013  . Colon polyps 02/27/2012  . CAD, NATIVE VESSEL 12/12/2009  . Osteoarthritis, generalized 04/19/2009  . Essential hypertension 10/16/2008  . HEMORRHOIDS 03/29/2008  . INTERMITTENT VERTIGO 03/29/2008    Past Surgical History:  Procedure Laterality Date  . CARDIAC CATHETERIZATION  09/02/2005   mild-mod calcification in mid LAD (20-30% luminal obstruction), otherwise normal coronaries, patent LE arteries (Dr. Jackie Plum)  . CAROTID DOPPLER  02/2008   R & L ICAs 0-49% diameter reduction  . CATARACT EXTRACTION W/ INTRAOCULAR LENS IMPLANT Left 01/06/2012   left, [place]  . CHOLECYSTECTOMY    . CHOLECYSTECTOMY, LAPAROSCOPIC  06/17/2006   with stones  . COLONOSCOPY  04/19/2012   Procedure: COLONOSCOPY;  Surgeon: Rogene Houston, MD;  Location: [facility] SUITE;  Service: Endoscopy;  Laterality: N/A;  1200  . COLONOSCOPY N/A 09/25/2015   Procedure: COLONOSCOPY;  Surgeon: Rogene Houston, MD;  Location: [facility]ITE;  Service: Endoscopy;  Laterality: N/A;  100  . COLONOSCOPY W/ POLYPECTOMY  05/29/1999, 01/12/2012  . dental implant   09/2014  . ESOPHAGOGASTRODUODENOSCOPY  01/12/2012  . EYE SURGERY  01/24/2012   bilateral cataract extraction  . HIP ARTHROPLASTY Left 01/31/2015   Procedure: ARTHROPLASTY BIPOLAR HIP;  Surgeon: Carole Civil, MD;  Location: [facility] ORS;  Service: Orthopedics;  Laterality: Left;  . KNEE ARTHROSCOPY Right 1999  . KNEE ARTHROSCOPY Left 1996  . NM MYOCAR  PERF WALL MOTION  02/2012   lexiscan - small, fized apical lateral bowel attenuation artifact; no reversible ischemia; EF 77%; non-diagnostic for ischemia; low risk  . PALATE SURGERY  1988  . ROTATOR CUFF REPAIR  05/14/2010  . TONSILLECTOMY    . TONSILLECTOMY AND ADENOIDECTOMY  1934  . TRANSTHORACIC ECHOCARDIOGRAM  01/2012   EF=>55%; mild mitral annular calcification & mild MR; trace TR; trace pulm valve regurg  . UPPER GI ENDOSCOPY  12/12/2006   with biopsy, with colonoscopy - dx: Barrett's disease    OB History    Gravida Para Term Preterm AB Living   0 0           SAB TAB Ectopic Multiple Live Births                   Home Medications    Prior to Admission medications   Medication Sig Start Date End Date Taking? Authorizing Provider  acetaminophen (TYLENOL) 500 MG tablet Take 500 mg by mouth every 6 (six) hours as needed.   Yes Historical Provider, MD  amLODipine (NORVASC) 5 MG tablet TAKE 1 TABLET (5 MG TOTAL) BY MOUTH DAILY. 04/26/16  Yes Fayrene Helper, MD  Calcium-Vitamin D-Vitamin K 500-100-40 MG-UNT-MCG CHEW Chew 1 tablet by mouth 2 (two) times daily.    Yes Historical Provider, MD  carbidopa-levodopa (SINEMET IR) 25-100 MG tablet TAKE 1/2 TABLET BY MOUTH THREE TIMES DAILY 06/17/16  Yes Kathrynn Ducking, MD  docusate sodium (COLACE) 100 MG capsule Take 100 mg by mouth 3 (three) times daily as needed (constipation).    Yes Historical Provider, MD  Magnesium Oxide (PHILLIPS) 500 MG (LAX) TABS Take 1 tablet (500 mg total) by mouth daily as needed. 02/03/15  Yes Radene Gunning, NP  meloxicam (MOBIC) 15 MG tablet TAKE 1 TABLET BY MOUTH DAILY 04/26/16  Yes Fayrene Helper, MD  Misc Natural Products (COSAMIN ASU ADVANCED FORMULA) CAPS Take 1 capsule by mouth 3 (three) times daily with meals.    Yes Historical Provider, MD  Multiple Vitamin (MULTIVITAMIN) tablet Take 1 tablet by mouth daily.     Yes Historical Provider, MD  nitroGLYCERIN (NITROSTAT) 0.4 MG SL tablet Place 0.4 mg under  the tongue every 5 (five) minutes as needed. 1 tablet under the tongue at onset of chest pain: you may repeat every 5 minutes for up to 3 doses   Yes Historical Provider, MD  omeprazole (PRILOSEC) 40 MG capsule TAKE ONE CAPSULE BY MOUTH TWICE A DAY AS NEEDED 10/13/15  Yes Fayrene Helper, MD  oxybutynin (DITROPAN-XL) 10 MG 24 hr tablet Take 1 tablet (10 mg total) by mouth at bedtime. 08/05/16  Yes Fayrene Helper, MD  Probiotic Product (HEALTHY COLON) CAPS Take 1 capsule by mouth daily.    Yes Historical Provider, MD  Wheat Dextrin (BENEFIBER) POWD Take 2 scoop by mouth 2 (two) times daily. Reported on 03/17/2016   Yes Historical Provider, MD    Family History Family History  Problem Relation Age of Onset  . Diabetes Mother   . Hypertension Mother     cva  . CVA Mother   . Kidney disease Mother   . Heart failure Mother   . Cancer Father     larengeal  . Heart failure Father     CHF  . Heart disease Father   . Parkinsonism Sister   . Osteoporosis Sister   . Stroke Maternal Grandmother   . Stroke Maternal Grandfather   . Colon cancer Neg Hx     Social History Social History  Substance Use Topics  . Smoking status: Former Smoker    Packs/day: 0.25    Years: 0.50  . Smokeless tobacco: Never Used  . Alcohol use No     Comment: occassionally wine with a meal     Allergies   Penicillins; Clarithromycin; and Flagyl [metronidazole]   Review of Systems Review of Systems  All other systems reviewed and are negative.    Physical Exam Updated Vital Signs BP 162/59 (BP Location: Left Arm)   Pulse 80   Temp 97.9 F (36.6 C) (Oral)   Resp 18   Ht 5\' 6"  (1.676 m)   Wt 63.5 kg   SpO2 100%   BMI 22.60 kg/m   Physical Exam  Constitutional: She appears well-developed and well-nourished. No distress.  HENT:  Head: Normocephalic and atraumatic.  Eyes: Conjunctivae are normal.  Neck: Neck supple.  Cardiovascular: Normal rate and regular rhythm.   No murmur  heard. Pulmonary/Chest: Effort normal and breath sounds normal. No respiratory distress.  Abdominal: Soft. There is no tenderness.  Musculoskeletal: She exhibits tenderness and deformity. She exhibits no edema.  Swollen tender right knee,  Tender right rib, pain with movement.  Neurological: She is alert.  Skin: Skin is warm and dry.  Psychiatric: She has a normal mood and affect.  Nursing note and vitals reviewed.    ED Treatments / Results  Labs (all labs ordered are listed, but only abnormal results are displayed) Labs Reviewed - No data to display  EKG  EKG Interpretation None       Radiology Dg Pelvis 1-2 Views  Result Date: 09/07/2016 CLINICAL DATA:  Pelvic pain, no injury EXAM: PELVIS - 1-2 VIEW COMPARISON:  Portable pelvis film of 01/31/2015 FINDINGS: A left total hip replacement is noted. No acute fracture is seen. The bones are diffusely osteopenic. There is some degenerative change present in the lower lumbar spine. IMPRESSION: No acute fracture.  Osteopenic.  Prior left total hip replacement. Electronically Signed   By: Ivar Drape M.D.   On: 09/07/2016 09:24   Dg Knee Complete 4 Views Right  Result Date: 09/07/2016 CLINICAL DATA:  Right knee pain, no acute injury EXAM: RIGHT KNEE - COMPLETE 4+ VIEW COMPARISON:  Right knee films of 09/15/2015 FINDINGS: Severe degenerative joint disease involves the lateral compartment of the right knee which is unchanged compared to prior images. There is complete loss of joint space with sclerosis and spurring present. Also degenerative changes noted involving the patellofemoral articulation with loss of joint space and sclerosis with spurring. Lesser degenerative change involves the medial compartment. There does appear to be a right knee joint effusion present. Chondrocalcinosis is noted which could indicate CPPD arthropathy. No acute fracture is seen. IMPRESSION: Little change in tricompartmental degenerative joint  disease with  significant involvement of the lateral and patellofemoral compartments. Small joint effusion. Chondrocalcinosis. Electronically Signed   By: Ivar Drape M.D.   On: 09/07/2016 09:26    Procedures Procedures (including critical care time)  Medications Ordered in ED Medications  methocarbamol (ROBAXIN) tablet 500 mg (500 mg Oral Given 09/07/16 0859)     Initial Impression / Assessment and Plan / ED Course  I have reviewed the triage vital signs and the nursing notes.  Pertinent labs & imaging results that were available during my care of the patient were reviewed by me and considered in my medical decision making (see chart for details).  Clinical Course    Pt advised to follow up with her Orthopaedist for evaluation.  Ice.  Continue curent medications Use your walker  Final Clinical Impressions(s) / ED Diagnoses   Final diagnoses:  Knee pain, right  Hip pain, right    New Prescriptions New Prescriptions   No medications on file  An After Visit Summary was printed and given to the patient.   Hollace Kinnier Swink, PA-C 09/07/16 [place], MD 09/07/16 [phone]

## 2016-09-07 NOTE — ED Provider Notes (Signed)
  Face-to-face evaluation   History: She c/o pain, right knee and hip. No fall. Was having trouble maneuvering today, so called EMS.  Physical exam: Alert, elderly female. Right knee is swollen and tender. Right hip is tender. Decreased range of motion right hip and right knee secondary to pain.  Medical screening examination/treatment/procedure(s) were conducted as a shared visit with non-physician practitioner(s) and myself.  I personally evaluated the patient during the encounter   Daleen Bo, MD 09/07/16 1529

## 2016-09-10 ENCOUNTER — Ambulatory Visit (INDEPENDENT_AMBULATORY_CARE_PROVIDER_SITE_OTHER): Payer: Medicare HMO | Admitting: Family Medicine

## 2016-09-10 ENCOUNTER — Encounter: Payer: Self-pay | Admitting: Family Medicine

## 2016-09-10 VITALS — BP 144/72 | HR 78 | Resp 16 | Ht 66.0 in | Wt 142.0 lb

## 2016-09-10 DIAGNOSIS — M549 Dorsalgia, unspecified: Secondary | ICD-10-CM

## 2016-09-10 DIAGNOSIS — G20 Parkinson's disease: Secondary | ICD-10-CM

## 2016-09-10 DIAGNOSIS — M170 Bilateral primary osteoarthritis of knee: Secondary | ICD-10-CM

## 2016-09-10 DIAGNOSIS — N3941 Urge incontinence: Secondary | ICD-10-CM

## 2016-09-10 DIAGNOSIS — I10 Essential (primary) hypertension: Secondary | ICD-10-CM

## 2016-09-10 DIAGNOSIS — M159 Polyosteoarthritis, unspecified: Secondary | ICD-10-CM | POA: Diagnosis not present

## 2016-09-10 DIAGNOSIS — Z09 Encounter for follow-up examination after completed treatment for conditions other than malignant neoplasm: Secondary | ICD-10-CM | POA: Diagnosis not present

## 2016-09-10 DIAGNOSIS — G20A1 Parkinson's disease without dyskinesia, without mention of fluctuations: Secondary | ICD-10-CM

## 2016-09-10 MED ORDER — MIRABEGRON ER 50 MG PO TB24: 50.0000 mg | ORAL_TABLET | Freq: Every day | ORAL | 3 refills | Status: DC

## 2016-09-10 MED ORDER — PREDNISONE 10 MG PO TABS: ORAL_TABLET | ORAL | 0 refills | Status: DC

## 2016-09-10 NOTE — Assessment & Plan Note (Signed)
Managed by neurology, markedly reduced mobility with tremor, high fall risk

## 2016-09-10 NOTE — Assessment & Plan Note (Signed)
Pain, stiffness, limitation in ADL's and mobility even with assistive devices. Short course of oral prednisone and referral for in home PT twice weekly for 6 weeks

## 2016-09-10 NOTE — Assessment & Plan Note (Signed)
Recent x ray in hospital showed no acute injury, if debilitating back, hip and knee pain persist , she is to call in next 4 days , I will refer her to ortho for further eval, she understands

## 2016-09-10 NOTE — Assessment & Plan Note (Signed)
Significant Low back, right hip and right thigh and right knee pain limiting ability to get into her bed for past 3 days Though reports slight improvement, marked limitation ion function, unable to sleep in her bed since incident and has had someone with her at night for the past 2 of 3 nights

## 2016-09-10 NOTE — Progress Notes (Signed)
   LASTARR Kerry Flynn     MRN: [id number]      DOB: September 21, 1927   HPI [name]  Patient in for follow up of recent ED visit Discharge summary, and laboratory and radiology data are reviewed, and any questions or concerns about recent hospitalization are discussed. Specific issues requiring follow up are specifically addressed.    ROS Denies recent fever or chills. Denies sinus pressure, nasal congestion, ear pain or sore throat. Denies chest congestion, productive cough or wheeze. C/o right leg swelling, denies chest pain or palpitations Denies abdominal pain, nausea, vomiting,diarrhea or constipation.   Denies depression, anxiety or insomnia. Denies skin break down or rash.   PE  BP (!) 144/72   Pulse 78   Resp 16   Ht 5\' 6"  (1.676 m)   Wt 142 lb (64.4 kg)   SpO2 99%   BMI 22.92 kg/m   Patient alert and oriented and in no cardiopulmonary distress.  HEENT: No facial asymmetry, EOMI,   oropharynx pink and moist.  Neck decreased ROM, marked kyphosis.  Chest: Clear to auscultation bilaterally.  CVS: S1, S2 no murmurs, no S3.Regular rate.  ABD: Soft non tender.   Ext: two plus pitting edema on rLE.  [id number] decreased ROM ROM spine, shoulders, hips and knees.  Skin: Intact, no ulcerations or rash noted.  Psych: Good eye contact, normal affect. Memory intact not anxious or depressed appearing.  CNS: CN 2-12 intact,  Assessment & Plan  Back pain with radiation Acute onset , debilitating , unable to get into her bed  Encounter for examination following treatment at hospital Significant Low back, right hip and right thigh and right knee pain limiting ability to get into her bed for past 3 days Though reports slight improvement, marked limitation ion function, unable to sleep in her bed since incident and has had someone with her at night for the past 2 of 3 nights  Osteoarthritis, generalized Pain, stiffness, limitation in ADL's and mobility even with assistive  devices. Short course of oral prednisone and referral for in home PT twice weekly for 6 weeks  Osteoarthritis of both knees Recent x ray in hospital showed no acute injury, if debilitating back, hip and knee pain persist , she is to call in next 4 days , I will refer her to ortho for further eval, she understands  Urinary incontinence, urge No control with oxybutynin, pt to resume previous drug  Essential hypertension Controlled, no change in medication   Parkinson disease ([place]) Managed by neurology, markedly reduced mobility with tremor, high fall risk

## 2016-09-10 NOTE — Assessment & Plan Note (Signed)
Acute onset , debilitating , unable to get into her bed

## 2016-09-10 NOTE — Patient Instructions (Signed)
F/u as before, call if you need me before   Prednisone sent for pain, and you are referred for in home physical therapy twice weekly x 6 weeks   Call by next Tuesday morning if not improving , I will refer you to Dr Marena Chancy for further evaluation  Thanks for choosing [facility], we consider it a privelige to serve you.

## 2016-09-10 NOTE — Assessment & Plan Note (Signed)
Controlled, no change in medication  

## 2016-09-10 NOTE — Assessment & Plan Note (Signed)
No control with oxybutynin, pt to resume previous drug

## 2016-09-11 DIAGNOSIS — R531 Weakness: Secondary | ICD-10-CM | POA: Diagnosis not present

## 2016-09-11 DIAGNOSIS — M545 Low back pain: Secondary | ICD-10-CM | POA: Diagnosis not present

## 2016-09-11 DIAGNOSIS — M25551 Pain in right hip: Secondary | ICD-10-CM | POA: Diagnosis not present

## 2016-09-11 DIAGNOSIS — G20 Parkinson's disease: Secondary | ICD-10-CM | POA: Diagnosis not present

## 2016-09-17 ENCOUNTER — Encounter: Payer: Self-pay | Admitting: Orthopedic Surgery

## 2016-09-17 ENCOUNTER — Ambulatory Visit (INDEPENDENT_AMBULATORY_CARE_PROVIDER_SITE_OTHER): Payer: Medicare HMO | Admitting: Orthopedic Surgery

## 2016-09-17 VITALS — BP 184/97 | HR 78 | Ht 66.0 in | Wt 142.0 lb

## 2016-09-17 DIAGNOSIS — M62838 Other muscle spasm: Secondary | ICD-10-CM | POA: Diagnosis not present

## 2016-09-17 NOTE — Progress Notes (Signed)
Chief Complaint  Patient presents with  . Hip Pain    RIGHT HIP PAIN   HPI 82 year old female with Parkinson's disease presents with right hip pain no trauma complained of severe pain went to the ER. Complains of severe muscle spasms at night. Pain present for the last week or so with dull aching throbbing pain in the right thigh. She said the thigh locked up on her she could lift her leg but with steroids she has improved although she still having some pain radiating from her back into her groin  Review of Systems  Constitutional: Negative for chills, fever and weight loss.  Respiratory: Negative for shortness of breath.   Musculoskeletal: Positive for back pain, joint pain and myalgias.  Neurological:       Rigidity    Past Medical History:  Diagnosis Date  . Abnormality of gait 11/17/2015  . Anemia   . Barrett's esophagus    Dr. Laural Golden, Dr. Moshe Cipro  . Benign recurrent vertigo   . Chronic constipation   . Chronic knee pain   . Colon polyps   . DDD (degenerative disc disease), cervical   . DDD (degenerative disc disease), lumbar   . Dermatitis    recurrent  . Diverticulosis   . 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 Fatigue    chronic  . GERD (gastroesophageal reflux disease)   . Head trauma    status post fall; upper and lower extremities   . Hemorrhoid   . Hemorrhoids   . Hip fracture, left ([place])    s/p left arthroplasty 2/16  . Hypertension   . Infertility, female   . Intermittent vertigo   . Osteoarthritis    of the neck  . Osteoporosis    knees  . Parkinson disease ([place]) 11/17/2015  . Peripheral edema    chronic  . Stable angina (HCC)   . Varicose veins   . Vitamin D deficiency     Past Surgical History:  Procedure Laterality Date  . CARDIAC CATHETERIZATION  09/02/2005   mild-mod calcification in mid LAD (20-30% luminal obstruction), otherwise normal coronaries, patent LE arteries (Dr. Jackie Plum)  . CAROTID DOPPLER  02/2008   R & L ICAs 0-49% diameter  reduction  . CATARACT EXTRACTION W/ INTRAOCULAR LENS IMPLANT Left 01/06/2012   left, [place]  . CHOLECYSTECTOMY    . CHOLECYSTECTOMY, LAPAROSCOPIC  06/17/2006   with stones  . COLONOSCOPY  04/19/2012   Procedure: COLONOSCOPY;  Surgeon: Rogene Houston, MD;  [facility];  Service: Endoscopy;  Laterality: N/A;  1200  . COLONOSCOPY N/A 09/25/2015   Procedure: COLONOSCOPY;  Surgeon: Rogene Houston, MD;  Location[facility];  Service: Endoscopy;  Laterality: N/A;  100  . COLONOSCOPY W/ POLYPECTOMY  05/29/1999, 01/12/2012  . dental implant   09/2014  . ESOPHAGOGASTRODUODENOSCOPY  01/12/2012  . EYE SURGERY  01/24/2012   bilateral cataract extraction  . HIP ARTHROPLASTY Left 01/31/2015   Procedure: ARTHROPLASTY BIPOLAR HIP;  Surgeon: Carole Civil, MD;  Location: [facility];  Service: Orthopedics;  Laterality: Left;  . KNEE ARTHROSCOPY Right 1999  . KNEE ARTHROSCOPY Left 1996  . NM MYOCAR PERF WALL MOTION  02/2012   lexiscan - small, fized apical lateral bowel attenuation artifact; no reversible ischemia; EF 77%; non-diagnostic for ischemia; low risk  . PALATE SURGERY  1988  . ROTATOR CUFF REPAIR  05/14/2010  . TONSILLECTOMY    . TONSILLECTOMY AND ADENOIDECTOMY  1934  . TRANSTHORACIC ECHOCARDIOGRAM  01/2012  EF=>55%; mild mitral annular calcification & mild MR; trace TR; trace pulm valve regurg  . UPPER GI ENDOSCOPY  12/12/2006   with biopsy, with colonoscopy - dx: Barrett's disease   Family History  Problem Relation Age of Onset  . Diabetes Mother   . Hypertension Mother     cva  . CVA Mother   . Kidney disease Mother   . Heart failure Mother   . Cancer Father     larengeal  . Heart failure Father     CHF  . Heart disease Father   . Parkinsonism Sister   . Osteoporosis Sister   . Stroke Maternal Grandmother   . Stroke Maternal Grandfather   . Colon cancer Neg Hx    Social History  Substance Use Topics  . Smoking status: Former Smoker    Packs/day: 0.25     Years: 0.50  . Smokeless tobacco: Never Used  . Alcohol use No     Comment: occassionally wine with a meal   Current Meds  Medication Sig  . acetaminophen (TYLENOL) 500 MG tablet Take 500 mg by mouth every 6 (six) hours as needed.  Marland Kitchen amLODipine (NORVASC) 5 MG tablet TAKE 1 TABLET (5 MG TOTAL) BY MOUTH DAILY.  . Calcium-Vitamin D-Vitamin K 500-100-40 MG-UNT-MCG CHEW Chew 1 tablet by mouth 2 (two) times daily.   . carbidopa-levodopa (SINEMET IR) 25-100 MG tablet TAKE 1/2 TABLET BY MOUTH THREE TIMES DAILY  . docusate sodium (COLACE) 100 MG capsule Take 100 mg by mouth 3 (three) times daily as needed (constipation).   . Magnesium Oxide (PHILLIPS) 500 MG (LAX) TABS Take 1 tablet (500 mg total) by mouth daily as needed.  . meloxicam (MOBIC) 15 MG tablet TAKE 1 TABLET BY MOUTH DAILY  . mirabegron ER (MYRBETRIQ) 50 MG TB24 tablet Take 1 tablet (50 mg total) by mouth daily.  . Misc Natural Products (COSAMIN ASU ADVANCED FORMULA) CAPS Take 1 capsule by mouth 3 (three) times daily with meals.   . Multiple Vitamin (MULTIVITAMIN) tablet Take 1 tablet by mouth daily.    . nitroGLYCERIN (NITROSTAT) 0.4 MG SL tablet Place 0.4 mg under the tongue every 5 (five) minutes as needed. 1 tablet under the tongue at onset of chest pain: you may repeat every 5 minutes for up to 3 doses  . omeprazole (PRILOSEC) 40 MG capsule TAKE ONE CAPSULE BY MOUTH TWICE A DAY AS NEEDED  . predniSONE (DELTASONE) 10 MG tablet One tablet 3 times daily for 2 days, then one tablet two times daily for 2 days, then one tablet once daily for 3 days , then stop  . Probiotic Product (HEALTHY COLON) CAPS Take 1 capsule by mouth daily.   . Wheat Dextrin (BENEFIBER) POWD Take 2 scoop by mouth 2 (two) times daily. Reported on 03/17/2016    BP (!) 184/97   Pulse 78   Ht 5\' 6"  (1.676 m)   Wt 142 lb (64.4 kg)   BMI 22.92 kg/m   Physical Exam Appearance the patient appears to have some torticollis. She is otherwise well groomed. She is  oriented to person place and time. Mood is pleasant her affect is normal  Ortho Exam She ambulates with walker.  Tenderness over the right hip flexors. No tenderness in the groin itself. She has active range of motion 100 right hip 120 left hip passive range of motion right hip normal. Hip is stable. She doesn't have weakness in the right hip but she does have stiffness in the hip  flexors. No skin abnormalities. No peripheral edema and she has normal sensation in the right leg  She does have lower back tenderness on the right side and in the L4-5 interspace and vertebral bodies.   ASSESSMENT: My personal interpretation of the images:  Plain film imaging pelvis was normal with left hip bipolar prosthesis no complications  She does have some lower back degenerative disease and scoliosis.    PLAN Robaxin 500 mg every 6 hours  Home therapy hip back and neck  Follow-up as needed  Arther Abbott, MD 09/17/2016 11:34 AM  .meds

## 2016-09-20 ENCOUNTER — Telehealth: Payer: Self-pay | Admitting: Orthopedic Surgery

## 2016-09-20 ENCOUNTER — Other Ambulatory Visit: Payer: Self-pay | Admitting: *Deleted

## 2016-09-20 ENCOUNTER — Ambulatory Visit (INDEPENDENT_AMBULATORY_CARE_PROVIDER_SITE_OTHER): Payer: 59 | Admitting: Internal Medicine

## 2016-09-20 MED ORDER — METHOCARBAMOL 500 MG PO TABS: 500.0000 mg | ORAL_TABLET | Freq: Four times a day (QID) | ORAL | 0 refills | Status: DC | PRN

## 2016-09-20 NOTE — Telephone Encounter (Signed)
Please advise both have been done

## 2016-09-20 NOTE — Telephone Encounter (Signed)
Notified patient.

## 2016-09-20 NOTE — Telephone Encounter (Signed)
Patient called to follow up on medication for muscle relaxer at [facility], [facility], and was concerned as to whether the muscle relaxer was ordered.  Also wanted to be sure that the home therapy orders have been entered for her neck therapy as discussed at Friday's (09/17/16) office visit.

## 2016-09-26 DIAGNOSIS — M545 Low back pain: Secondary | ICD-10-CM | POA: Diagnosis not present

## 2016-09-26 DIAGNOSIS — M25551 Pain in right hip: Secondary | ICD-10-CM | POA: Diagnosis not present

## 2016-09-26 DIAGNOSIS — R531 Weakness: Secondary | ICD-10-CM | POA: Diagnosis not present

## 2016-09-26 DIAGNOSIS — G20 Parkinson's disease: Secondary | ICD-10-CM | POA: Diagnosis not present

## 2016-09-29 ENCOUNTER — Encounter: Payer: Self-pay | Admitting: Neurology

## 2016-09-29 ENCOUNTER — Ambulatory Visit (INDEPENDENT_AMBULATORY_CARE_PROVIDER_SITE_OTHER): Payer: Medicare HMO | Admitting: Neurology

## 2016-09-29 VITALS — BP 157/59 | HR 86 | Ht 66.0 in | Wt 142.0 lb

## 2016-09-29 DIAGNOSIS — G20 Parkinson's disease: Secondary | ICD-10-CM

## 2016-09-29 DIAGNOSIS — G4762 Sleep related leg cramps: Secondary | ICD-10-CM | POA: Insufficient documentation

## 2016-09-29 DIAGNOSIS — R269 Unspecified abnormalities of gait and mobility: Secondary | ICD-10-CM | POA: Diagnosis not present

## 2016-09-29 MED ORDER — BACLOFEN 10 MG PO TABS: 5.0000 mg | ORAL_TABLET | Freq: Every day | ORAL | 1 refills | Status: DC

## 2016-09-29 NOTE — Progress Notes (Signed)
Reason for visit: Parkinsonism  Kerry Flynn is an 82 y.o. female  History of present illness:  Kerry Flynn is an 82 year old right-handed white female with a history of what is felt to be Parkinson's disease on low-dose Sinemet taking the 25/100 mg tablet, one half tablet 3 times daily. The patient has not had any perceived changes in her ability to ambulate since last seen. The patient has a lot of discomfort associated with the right hip and with the knees bilaterally. She has some edema in the lower extremities. She lives at home, she has a caretaker who comes in on a daily basis and on weekends to help her. She has a stair lift that allows her to go up and down a flight of stairs safely. The patient has not had any falls, she uses a walker for ambulation. She comes into the office today for an evaluation. She denies any issues with drooling or difficulty with swallowing. She does have a tremor with the left upper extremity. She does report relatively frequent nocturnal leg cramps, she uses Robaxin for this with only a modest benefit.  Past Medical History:  Diagnosis Date  . Abnormality of gait 11/17/2015  . Anemia   . Barrett's esophagus    Dr. Laural Golden, Dr. Moshe Cipro  . Benign recurrent vertigo   . Chronic constipation   . Chronic knee pain   . Colon polyps   . DDD (degenerative disc disease), cervical   . DDD (degenerative disc disease), lumbar   . Dermatitis    recurrent  . Diverticulosis   . 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 Fatigue    chronic  . GERD (gastroesophageal reflux disease)   . Head trauma    status post fall; upper and lower extremities   . Hemorrhoid   . Hemorrhoids   . Hip fracture, left ([place])    s/p left arthroplasty 2/16  . Hypertension   . Infertility, female   . Intermittent vertigo   . Osteoarthritis    of the neck  . Osteoporosis    knees  . Parkinson disease ([place]) 11/17/2015  . Peripheral edema    chronic  . Stable angina (HCC)   .  Varicose veins   . Vitamin D deficiency     Past Surgical History:  Procedure Laterality Date  . CARDIAC CATHETERIZATION  09/02/2005   mild-mod calcification in mid LAD (20-30% luminal obstruction), otherwise normal coronaries, patent LE arteries (Dr. Jackie Plum)  . CAROTID DOPPLER  02/2008   R & L ICAs 0-49% diameter reduction  . CATARACT EXTRACTION W/ INTRAOCULAR LENS IMPLANT Left 01/06/2012   left, [place]  . CHOLECYSTECTOMY    . CHOLECYSTECTOMY, LAPAROSCOPIC  06/17/2006   with stones  . COLONOSCOPY  04/19/2012   Procedure: COLONOSCOPY;  Surgeon: Rogene Houston, MD;  [facility];  Service: Endoscopy;  Laterality: N/A;  1200  . COLONOSCOPY N/A 09/25/2015   Procedure: COLONOSCOPY;  Surgeon: Rogene Houston, MD;  [facility];  Service: Endoscopy;  Laterality: N/A;  100  . COLONOSCOPY W/ POLYPECTOMY  05/29/1999, 01/12/2012  . dental implant   09/2014  . ESOPHAGOGASTRODUODENOSCOPY  01/12/2012  . EYE SURGERY  01/24/2012   bilateral cataract extraction  . HIP ARTHROPLASTY Left 01/31/2015   Procedure: ARTHROPLASTY BIPOLAR HIP;  Surgeon: Carole Civil, MD;  Location: [facility];  Service: Orthopedics;  Laterality: Left;  . KNEE ARTHROSCOPY Right 1999  . KNEE ARTHROSCOPY Left 1996  . NM MYOCAR PERF  WALL MOTION  02/2012   lexiscan - small, fized apical lateral bowel attenuation artifact; no reversible ischemia; EF 77%; non-diagnostic for ischemia; low risk  . PALATE SURGERY  1988  . ROTATOR CUFF REPAIR  05/14/2010  . TONSILLECTOMY    . TONSILLECTOMY AND ADENOIDECTOMY  1934  . TRANSTHORACIC ECHOCARDIOGRAM  01/2012   EF=>55%; mild mitral annular calcification & mild MR; trace TR; trace pulm valve regurg  . UPPER GI ENDOSCOPY  12/12/2006   with biopsy, with colonoscopy - dx: Barrett's disease    Family History  Problem Relation Age of Onset  . Diabetes Mother   . Hypertension Mother     cva  . CVA Mother   . Kidney disease Mother   . Heart failure Mother   . Cancer  Father     larengeal  . Heart failure Father     CHF  . Heart disease Father   . Parkinsonism Sister   . Osteoporosis Sister   . Stroke Maternal Grandmother   . Stroke Maternal Grandfather   . Colon cancer Neg Hx     Social history:  reports that she has quit smoking. She has a 0.12 pack-year smoking history. She has never used smokeless tobacco. She reports that she does not drink alcohol or use drugs.    Allergies  Allergen Reactions  . Penicillins Anaphylaxis    Has patient had a PCN reaction causing immediate rash, facial/tongue/throat swelling, SOB or lightheadedness with hypotension: Yes Has patient had a PCN reaction causing severe rash involving mucus membranes or skin necrosis: Yes Has patient had a PCN reaction that required hospitalization Yes Has patient had a PCN reaction occurring within the last 10 years: No If all of the above answers are "NO", then may proceed with Cephalosporin use.yes  . Clarithromycin   . Flagyl [Metronidazole]     Medications:  Prior to Admission medications   Medication Sig Start Date End Date Taking? Authorizing Provider  acetaminophen (TYLENOL) 500 MG tablet Take 500 mg by mouth every 6 (six) hours as needed.   Yes Historical Provider, MD  amLODipine (NORVASC) 5 MG tablet TAKE 1 TABLET (5 MG TOTAL) BY MOUTH DAILY. 04/26/16  Yes Fayrene Helper, MD  Calcium-Vitamin D-Vitamin K 500-100-40 MG-UNT-MCG CHEW Chew 1 tablet by mouth 2 (two) times daily.    Yes Historical Provider, MD  carbidopa-levodopa (SINEMET IR) 25-100 MG tablet TAKE 1/2 TABLET BY MOUTH THREE TIMES DAILY 06/17/16  Yes Kathrynn Ducking, MD  docusate sodium (COLACE) 100 MG capsule Take 100 mg by mouth 3 (three) times daily as needed (constipation).    Yes Historical Provider, MD  Magnesium Oxide (PHILLIPS) 500 MG (LAX) TABS Take 1 tablet (500 mg total) by mouth daily as needed. 02/03/15  Yes Radene Gunning, NP  meloxicam (MOBIC) 15 MG tablet TAKE 1 TABLET BY MOUTH DAILY 04/26/16  Yes  Fayrene Helper, MD  methocarbamol (ROBAXIN) 500 MG tablet Take 1 tablet (500 mg total) by mouth every 6 (six) hours as needed for muscle spasms. 09/20/16  Yes Carole Civil, MD  mirabegron ER (MYRBETRIQ) 50 MG TB24 tablet Take 1 tablet (50 mg total) by mouth daily. 09/10/16  Yes Fayrene Helper, MD  Misc Natural Products (COSAMIN ASU ADVANCED FORMULA) CAPS Take 1 capsule by mouth 3 (three) times daily with meals.    Yes Historical Provider, MD  Multiple Vitamin (MULTIVITAMIN) tablet Take 1 tablet by mouth daily.     Yes Historical Provider, MD  nitroGLYCERIN (NITROSTAT)  0.4 MG SL tablet Place 0.4 mg under the tongue every 5 (five) minutes as needed. 1 tablet under the tongue at onset of chest pain: you may repeat every 5 minutes for up to 3 doses   Yes Historical Provider, MD  omeprazole (PRILOSEC) 40 MG capsule TAKE ONE CAPSULE BY MOUTH TWICE A DAY AS NEEDED 10/13/15  Yes Fayrene Helper, MD  predniSONE (DELTASONE) 10 MG tablet One tablet 3 times daily for 2 days, then one tablet two times daily for 2 days, then one tablet once daily for 3 days , then stop 09/10/16  Yes Fayrene Helper, MD  Probiotic Product (HEALTHY COLON) CAPS Take 1 capsule by mouth daily.    Yes Historical Provider, MD  traMADol Veatrice Bourbon) 50 MG tablet  09/15/16  Yes Historical Provider, MD  Wheat Dextrin (BENEFIBER) POWD Take 2 scoop by mouth 2 (two) times daily. Reported on 03/17/2016   Yes Historical Provider, MD    ROS:  Out of a complete 14 system review of symptoms, the patient complains only of the following symptoms, and all other reviewed systems are negative.  Joint swelling, walking difficulty Tremors  Blood pressure (!) 157/59, pulse 86, height 5\' 6"  (1.676 m), weight 142 lb (64.4 kg).  Physical Exam  General: The patient is alert and cooperative at the time of the examination.  Skin: 2+ edema at the ankles is noted bilaterally.   Neurologic Exam  Mental status: The patient is alert and  oriented x 3 at the time of the examination. The patient has apparent normal recent and remote memory, with an apparently normal attention span and concentration ability.   Cranial nerves: Facial symmetry is present. Speech is normal, no aphasia or dysarthria is noted. Extraocular movements are full. Visual fields are full. Masking of the face is seen.  Motor: The patient has good strength in all 4 extremities, with exception of 4/5 strength with hip flexion on the right.  Sensory examination: Soft touch sensation is symmetric on the face, arms, and legs.  Coordination: The patient has good finger-nose-finger bilaterally, but she has some difficulty with heel-to-shin bilaterally.  Gait and station: The patient is able to walk with a walker. The patient has slow deliberate steps, fair turns. Tandem gait was not attempted.  Reflexes: Deep tendon reflexes are symmetric, but are depressed.   Assessment/Plan:  1. Parkinson's disease  2. Gait disorder  3. Nocturnal leg cramps  The patient will remain on Sinemet taking the 25/100 mg tablets, one half tablet 3 times daily. She will go on baclofen 5 mg at night for the leg cramps. She will follow-up in about 5 months.  Jill Alexanders MD 09/29/2016 2:42 PM  [facility] [address] [place], [place] [address]  Phone [phone] Fax [phone]

## 2016-09-29 NOTE — Patient Instructions (Signed)
   Take baclofen 10 mg tablet, 1/2 tablet at night

## 2016-10-04 ENCOUNTER — Other Ambulatory Visit: Payer: Self-pay

## 2016-10-04 MED ORDER — CARBIDOPA-LEVODOPA 25-100 MG PO TABS: 0.5000 | ORAL_TABLET | Freq: Three times a day (TID) | ORAL | 3 refills | Status: DC

## 2016-10-11 ENCOUNTER — Other Ambulatory Visit: Payer: Self-pay | Admitting: Family Medicine

## 2016-10-16 ENCOUNTER — Other Ambulatory Visit: Payer: Self-pay | Admitting: Family Medicine

## 2016-10-18 ENCOUNTER — Ambulatory Visit (INDEPENDENT_AMBULATORY_CARE_PROVIDER_SITE_OTHER): Payer: Medicare HMO | Admitting: Family Medicine

## 2016-10-18 ENCOUNTER — Encounter: Payer: Self-pay | Admitting: Family Medicine

## 2016-10-18 VITALS — BP 132/60 | HR 66 | Ht 66.0 in | Wt 140.0 lb

## 2016-10-18 DIAGNOSIS — G20 Parkinson's disease: Secondary | ICD-10-CM

## 2016-10-18 DIAGNOSIS — I10 Essential (primary) hypertension: Secondary | ICD-10-CM | POA: Diagnosis not present

## 2016-10-18 DIAGNOSIS — I739 Peripheral vascular disease, unspecified: Secondary | ICD-10-CM | POA: Diagnosis not present

## 2016-10-18 LAB — CBC
HCT: 29.2 % — ABNORMAL LOW (ref 35.0–45.0)
Hemoglobin: 9.7 g/dL — ABNORMAL LOW (ref 11.7–15.5)
MCH: 30.1 pg (ref 27.0–33.0)
MCHC: 33.2 g/dL (ref 32.0–36.0)
MCV: 90.7 fL (ref 80.0–100.0)
MPV: 11.3 fL (ref 7.5–12.5)
PLATELETS: 197 10*3/uL (ref 140–400)
RBC: 3.22 MIL/uL — AB (ref 3.80–5.10)
RDW: 12.7 % (ref 11.0–15.0)
WBC: 5.9 10*3/uL (ref 3.8–10.8)

## 2016-10-18 NOTE — Patient Instructions (Signed)
F/u as before , call if you need me sooner  Labs today  Keep legs elevated whenever you are sitting please  Script for knee high compression hose to be filled at [facility] are referred to [facility] for evaluation of circulation in legs, which I believe is the main reason for your leg swelling  Thank you  for choosing [facility]. We consider it a privelige to serve you.  Delivering excellent health care in a caring and  compassionate way is our goal.  Partnering with you,  so that together we can achieve this goal is our strategy.

## 2016-10-19 ENCOUNTER — Telehealth: Payer: Self-pay

## 2016-10-19 LAB — COMPLETE METABOLIC PANEL WITH GFR
ALT: 15 U/L (ref 6–29)
AST: 26 U/L (ref 10–35)
Albumin: 3.6 g/dL (ref 3.6–5.1)
Alkaline Phosphatase: 56 U/L (ref 33–130)
BILIRUBIN TOTAL: 0.3 mg/dL (ref 0.2–1.2)
BUN: 33 mg/dL — AB (ref 7–25)
CHLORIDE: 103 mmol/L (ref 98–110)
CO2: 26 mmol/L (ref 20–31)
CREATININE: 1.18 mg/dL — AB (ref 0.60–0.88)
Calcium: 9 mg/dL (ref 8.6–10.4)
GFR, Est African American: 48 mL/min — ABNORMAL LOW (ref >=60)
GFR, Est Non African American: 42 mL/min — ABNORMAL LOW (ref >=60)
GLUCOSE: 99 mg/dL (ref 65–99)
Potassium: 4.8 mmol/L (ref 3.5–5.3)
Sodium: 138 mmol/L (ref 135–146)
TOTAL PROTEIN: 6.6 g/dL (ref 6.1–8.1)

## 2016-10-19 MED ORDER — BACLOFEN 10 MG PO TABS: 5.0000 mg | ORAL_TABLET | Freq: Every day | ORAL | 0 refills | Status: DC

## 2016-10-19 NOTE — Telephone Encounter (Signed)
90 day refill request

## 2016-10-20 DIAGNOSIS — M1711 Unilateral primary osteoarthritis, right knee: Secondary | ICD-10-CM | POA: Diagnosis not present

## 2016-10-20 DIAGNOSIS — M1712 Unilateral primary osteoarthritis, left knee: Secondary | ICD-10-CM | POA: Diagnosis not present

## 2016-10-20 DIAGNOSIS — M542 Cervicalgia: Secondary | ICD-10-CM | POA: Diagnosis not present

## 2016-10-20 DIAGNOSIS — M150 Primary generalized (osteo)arthritis: Secondary | ICD-10-CM | POA: Diagnosis not present

## 2016-10-20 DIAGNOSIS — M25562 Pain in left knee: Secondary | ICD-10-CM | POA: Diagnosis not present

## 2016-10-20 DIAGNOSIS — M25561 Pain in right knee: Secondary | ICD-10-CM | POA: Diagnosis not present

## 2016-10-23 NOTE — Assessment & Plan Note (Signed)
Managed by neurology, seems to be having some response to medication

## 2016-10-23 NOTE — Progress Notes (Signed)
   Kerry Flynn     MRN: [id number]      DOB: 26-Jul-1927   HPI Kerry Flynn is here with an approx 2 month h/o bilateral leg swelling, often does not elevate her legs when sitting, denies salt indiscretion. Denies PND or orthopnea, chest pain or   palpitations ROS Denies recent fever or chills. Denies sinus pressure, nasal congestion, ear pain or sore throat. Denies chest congestion, productive cough or wheezing.  Chronic urinary  incontinence. Chronic  joint pain, swelling and limitation in mobility. Denies headaches, seizures, numbness, or tingling. Denies depression, anxiety or insomnia. Denies skin break down or rash.   PE  BP 132/60   Pulse 66   Ht 5\' 6"  (1.676 m)   Wt 140 lb (63.5 kg)   SpO2 97%   BMI 22.60 kg/m   Patient alert and oriented and in no cardiopulmonary distress.  HEENT: No facial asymmetry, EOMI,   oropharynx pink and moist.  Neck supple no JVD, no mass.  Chest: Clear to auscultation bilaterally.  CVS: S1, S2 no murmurs, no S3.Regular rate.  ABD: Soft non tender.   Ext: 2 plus pitting edema, distended superficial and deep veins of both lower ext MS: decreased  ROM spine, shoulders, hips and knees.  Skin: Intact, no ulcerations or rash noted.  Psych: Good eye contact, normal affect. Memory intact not anxious or depressed appearing.  CNS: CN 2-12 intact,  Assessment & Plan  PVD (peripheral vascular disease) (HCC) Bilateral leg edema with venous insufficiency, bilateral compression hose, leg elevation and vascular eval  Parkinson disease ([place]) Managed by neurology, seems to be having some response to medication  Essential hypertension Controlled, no change in medication

## 2016-10-23 NOTE — Assessment & Plan Note (Signed)
Controlled, no change in medication  

## 2016-10-23 NOTE — Assessment & Plan Note (Signed)
Bilateral leg edema with venous insufficiency, bilateral compression hose, leg elevation and vascular eval

## 2016-10-26 DIAGNOSIS — I739 Peripheral vascular disease, unspecified: Secondary | ICD-10-CM | POA: Diagnosis not present

## 2016-10-28 ENCOUNTER — Other Ambulatory Visit: Payer: Self-pay

## 2016-10-28 DIAGNOSIS — R609 Edema, unspecified: Secondary | ICD-10-CM

## 2016-11-04 ENCOUNTER — Telehealth: Payer: Self-pay

## 2016-11-04 DIAGNOSIS — E860 Dehydration: Secondary | ICD-10-CM

## 2016-11-04 NOTE — Telephone Encounter (Signed)
-----  Message from Fayrene Helper, MD sent at 10/19/2016  4:15 AM EDT ----- pls let her know, appears to be mildly dehydrated and , therefire increase water intake and will look at kidney function and hydration status as well as blood count , all non fasting first week in December lab draw for Dec 12 follow up visit, pls order the tests, needs rept CBC, chem 7 and EGFR ordered, thanks ?? pls ask

## 2016-11-04 NOTE — Telephone Encounter (Signed)
Repeat labs ordered

## 2016-11-20 ENCOUNTER — Other Ambulatory Visit: Payer: Self-pay | Admitting: Family Medicine

## 2016-12-01 ENCOUNTER — Encounter: Payer: Self-pay | Admitting: Vascular Surgery

## 2016-12-02 ENCOUNTER — Other Ambulatory Visit: Payer: Self-pay | Admitting: Family Medicine

## 2016-12-02 DIAGNOSIS — E860 Dehydration: Secondary | ICD-10-CM | POA: Diagnosis not present

## 2016-12-02 DIAGNOSIS — D539 Nutritional anemia, unspecified: Secondary | ICD-10-CM | POA: Diagnosis not present

## 2016-12-03 LAB — BASIC METABOLIC PANEL WITH GFR
BUN: 33 mg/dL — ABNORMAL HIGH (ref 7–25)
CHLORIDE: 102 mmol/L (ref 98–110)
CO2: 27 mmol/L (ref 20–31)
Calcium: 8.9 mg/dL (ref 8.6–10.4)
Creat: 0.91 mg/dL — ABNORMAL HIGH (ref 0.60–0.88)
GFR, EST NON AFRICAN AMERICAN: 57 mL/min — AB (ref >=60)
GFR, Est African American: 66 mL/min (ref >=60)
Glucose, Bld: 91 mg/dL (ref 65–99)
POTASSIUM: 4.6 mmol/L (ref 3.5–5.3)
SODIUM: 137 mmol/L (ref 135–146)

## 2016-12-03 LAB — IRON,TIBC AND FERRITIN PANEL
%SAT: 13 % (ref 11–50)
Ferritin: 42 ng/mL (ref 20–288)
Iron: 44 ug/dL — ABNORMAL LOW (ref 45–160)
TIBC: 350 ug/dL (ref 250–450)

## 2016-12-03 LAB — CBC
HEMATOCRIT: 31.1 % — AB (ref 35.0–45.0)
Hemoglobin: 10.1 g/dL — ABNORMAL LOW (ref 11.7–15.5)
MCH: 30.2 pg (ref 27.0–33.0)
MCHC: 32.5 g/dL (ref 32.0–36.0)
MCV: 93.1 fL (ref 80.0–100.0)
MPV: 11.7 fL (ref 7.5–12.5)
PLATELETS: 152 10*3/uL (ref 140–400)
RBC: 3.34 MIL/uL — AB (ref 3.80–5.10)
RDW: 13.4 % (ref 11.0–15.0)
WBC: 5.8 10*3/uL (ref 3.8–10.8)

## 2016-12-03 LAB — B12 AND FOLATE PANEL
FOLATE: 18.6 ng/mL (ref >5.4)
Vitamin B-12: 608 pg/mL (ref 200–1100)

## 2016-12-07 ENCOUNTER — Encounter: Payer: Self-pay | Admitting: Family Medicine

## 2016-12-07 ENCOUNTER — Ambulatory Visit (INDEPENDENT_AMBULATORY_CARE_PROVIDER_SITE_OTHER): Payer: Medicare HMO | Admitting: Family Medicine

## 2016-12-07 VITALS — BP 148/72 | HR 74 | Resp 16 | Ht 66.0 in | Wt 136.0 lb

## 2016-12-07 DIAGNOSIS — N3941 Urge incontinence: Secondary | ICD-10-CM

## 2016-12-07 DIAGNOSIS — R251 Tremor, unspecified: Secondary | ICD-10-CM

## 2016-12-07 DIAGNOSIS — D500 Iron deficiency anemia secondary to blood loss (chronic): Secondary | ICD-10-CM

## 2016-12-07 DIAGNOSIS — I10 Essential (primary) hypertension: Secondary | ICD-10-CM | POA: Diagnosis not present

## 2016-12-07 DIAGNOSIS — G20 Parkinson's disease: Secondary | ICD-10-CM | POA: Diagnosis not present

## 2016-12-07 NOTE — Assessment & Plan Note (Signed)
Adequate control, no med change 

## 2016-12-07 NOTE — Assessment & Plan Note (Signed)
Managed by neurology and stable  On sinemet

## 2016-12-07 NOTE — Assessment & Plan Note (Signed)
Continue current medication.

## 2016-12-07 NOTE — Patient Instructions (Addendum)
Wellness visit last week in April or early May, call if you need em sooner  Please return for flu vaccine BEFORE January!  Blood work is I improved, great!  Start OTC iron 65 mg one daily or one 4 times per week  Leg swelling is improved, great!  Pls contact gI for your faollow up appt  Call us if you decide on a mammogram  Thank you  for choosing [facility]. We consider it a privelige to serve you.  Delivering excellent health care in a caring and  compassionate way is our goal.  Partnering with you,  so that together we can achieve this goal is our strategy.

## 2016-12-07 NOTE — Assessment & Plan Note (Signed)
Needs to start daily OTC low dose iron, or 4 times weekly Likely diverticulosis is the cause  Will make and keep annual gI eval

## 2016-12-07 NOTE — Progress Notes (Signed)
   ADONA BORAN     MRN: [id number]      DOB: [birth year]   HPI Ms. Liechti is here for follow up and re-evaluation of chronic medical conditions, medication management and review of any available recent lab and radiology data.  Preventive health is updated, specifically  Cancer screening and Immunization.   Questions or concerns regarding consultations or procedures which the PT has had in the interim are  addressed. The PT denies any adverse reactions to current medications since the last visit.  There are no new concerns.  There are no specific complaints   ROS Denies recent fever or chills. Denies sinus pressure, nasal congestion, ear pain or sore throat. Denies chest congestion, productive cough or wheezing. Denies chest pains, palpitations and leg swelling Denies abdominal pain, nausea, vomiting,diarrhea or constipation.   Denies dysuria, frequency, hesitancy or incontinence. Denies joint pain, swelling and limitation in mobility. Denies headaches, seizures, numbness, or tingling. Denies depression, anxiety or insomnia. Denies skin break down or rash.   PE  BP (!) 148/72   Pulse 74   Resp 16   Ht 5\' 6"  (1.676 m)   Wt 136 lb (61.7 kg)   SpO2 99%   BMI 21.95 kg/m   Patient alert and oriented and in no cardiopulmonary distress.  HEENT: No facial asymmetry, EOMI,   oropharynx pink and moist.  Neck supple no JVD, no mass.  Chest: Clear to auscultation bilaterally.  CVS: S1, S2 no murmurs, no S3.Regular rate.  ABD: Soft non tender.   Ext: No edema  MS: Adequate ROM spine, shoulders, hips and knees.  Skin: Intact, no ulcerations or rash noted.  Psych: Good eye contact, normal affect. Memory intact not anxious or depressed appearing.  CNS: CN 2-12 intact, power,  normal throughout.no focal deficits noted.   Assessment & Plan  No physical exam doe at viosit

## 2016-12-07 NOTE — Assessment & Plan Note (Signed)

## 2016-12-07 NOTE — Assessment & Plan Note (Signed)
Improved o medication for Parkinsons, has neurology f/u in 2 monhts

## 2016-12-07 NOTE — Progress Notes (Signed)
   Kerry Flynn     MRN: [id number]      DOB: 1927/09/06   HPI Kerry Flynn is here for follow up and re-evaluation of chronic medical conditions, medication management and review of any available recent lab and radiology data.  Preventive health is updated, specifically  Cancer screening and Immunization.  Holding off on flu vaccine today, now considering mammogram and will check her ins Questions or concerns regarding consultations or procedures which the PT has had in the interim are  addressed. The PT denies any adverse reactions to current medications since the last visit.  There are no new concerns. States feels better since her last visit and leg swelling has improved Happy that labs are better, states will call for gI one year follow up,has pan diverticulosis and iDA   ROS Denies recent fever or chills. Denies sinus pressure, nasal congestion, ear pain or sore throat. Denies chest congestion, productive cough or wheezing. Denies chest pains, palpitations and leg swelling Denies abdominal pain, nausea, vomiting,diarrhea or constipation.   Denies dysuria, frequency, hesitancy or incontinence. Denies uncontrolled  joint pain, swelling and does have  limitation in mobility. Denies headaches, seizures, numbness, or tingling. Denies depression, anxiety or insomnia. Denies skin break down or rash.   PE  BP (!) 148/72   Pulse 74   Resp 16   Ht 5\' 6"  (1.676 m)   Wt 136 lb (61.7 kg)   SpO2 99%   BMI 21.95 kg/m   Patient alert and oriented and in no cardiopulmonary distress.  HEENT: No facial asymmetry, EOMI,   oropharynx pink and moist.  Neck decreased ROM JVD, no mass.  Chest: Clear to auscultation bilaterally.  CVS: S1, S2 no murmurs, no S3.Regular rate.  ABD: Soft non tender.   Ext: trace  edema  MS: decreased  ROM spine, shoulders, hips and knees.  Skin: Intact, no ulcerations or rash noted.  Psych: Good eye contact, normal affect. Memory intact not anxious or  depressed appearing.  CNS: CN 2-12 intact, power,  normal throughout.no focal deficits noted.   Assessment & Plan   Annual physical exam Annual exam as documented. Counseling done  re healthy lifestyle involving commitment to 150 minutes exercise per week, heart healthy diet, and attaining healthy weight.The importance of adequate sleep also discussed. Regular seat belt use and home safety, is also discussed. Changes in health habits are decided on by the patient with goals and time frames  set for achieving them. Immunization and cancer screening needs are specifically addressed at this visit.   IDA (iron deficiency anemia) Needs to start daily OTC low dose iron, or 4 times weekly Likely diverticulosis is the cause  Will make and keep annual gI eval  Tremor Improved o medication for Parkinsons, has neurology f/u in 2 monhts  Essential hypertension Adequate control, no med change  Parkinson disease ([place]) Managed by neurology and stable  On sinemet  Urinary incontinence, urge Continue current medication

## 2016-12-09 ENCOUNTER — Ambulatory Visit (HOSPITAL_COMMUNITY)
Admission: RE | Admit: 2016-12-09 | Discharge: 2016-12-09 | Disposition: A | Discharge status: Home / Self Care | Payer: Medicare HMO | Source: Ambulatory Visit | Attending: Vascular Surgery | Admitting: Vascular Surgery

## 2016-12-09 ENCOUNTER — Ambulatory Visit (INDEPENDENT_AMBULATORY_CARE_PROVIDER_SITE_OTHER): Payer: Medicare HMO | Admitting: Vascular Surgery

## 2016-12-09 ENCOUNTER — Encounter: Payer: Self-pay | Admitting: Vascular Surgery

## 2016-12-09 VITALS — BP 148/68 | HR 71 | Temp 98.0°F | Resp 18 | Ht 66.0 in | Wt 136.0 lb

## 2016-12-09 DIAGNOSIS — I890 Lymphedema, not elsewhere classified: Secondary | ICD-10-CM

## 2016-12-09 DIAGNOSIS — R609 Edema, unspecified: Secondary | ICD-10-CM

## 2016-12-09 NOTE — Progress Notes (Signed)
Patient name: Kerry Flynn MRN: [id number] DOB: January 01, 1927 Sex: female  REASON FOR CONSULT: Bilateral lower extremity edema with chronic venous insufficiency. Referred by Dr. Moshe Cipro.  HPI: Kerry Flynn is a 82 y.o. female, who was referred for evaluation of chronic venous insufficiency. The patient tells me that she noted the gradual onset of swelling in both lower extremities over the last several months. She had been placed on a medication for her bladder and thinks that potentially was related to this. She states that before this she did not have any significant swelling. She denies any history of DVT or phlebitis. Her only previous abdominal surgery was a cholecystectomy. She's had no previous radiation therapy.  Her swelling is asymptomatic.  Past Medical History:  Diagnosis Date  . Abnormality of gait 11/17/2015  . Anemia   . Barrett's esophagus    Dr. Laural Golden, Dr. Moshe Cipro  . Benign recurrent vertigo   . Chronic constipation   . Chronic knee pain   . Colon polyps   . DDD (degenerative disc disease), cervical   . DDD (degenerative disc disease), lumbar   . Dermatitis    recurrent  . Diverticulosis   . 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 Fatigue    chronic  . GERD (gastroesophageal reflux disease)   . Head trauma    status post fall; upper and lower extremities   . Hemorrhoid   . Hemorrhoids   . Hip fracture, left ([place])    s/p left arthroplasty 2/16  . Hypertension   . Infertility, female   . Intermittent vertigo   . Osteoarthritis    of the neck  . Osteoporosis    knees  . Parkinson disease ([place]) 11/17/2015  . Peripheral edema    chronic  . Stable angina (H[facility])   . Varicose veins   . Vitamin D deficiency     Family History  Problem Relation Age of Onset  . Diabetes Mother   . Hypertension Mother     cva  . CVA Mother   . Kidney disease Mother   . Heart failure Mother   . Cancer Father     larengeal  . Heart failure Father     CHF  . Heart  disease Father   . Parkinsonism Sister   . Osteoporosis Sister   . Stroke Maternal Grandmother   . Stroke Maternal Grandfather   . Colon cancer Neg Hx     SOCIAL HISTORY: Social History   Social History  . Marital status: Widowed    Spouse name: N/A  . Number of children: 0  . Years of education: B.S.   Occupational History  . retired   .  Retired   Social History Main Topics  . Smoking status: Former Smoker    Packs/day: 0.25    Years: 0.50    Quit date: 12/27/1958  . Smokeless tobacco: Never Used  . Alcohol use No     Comment: occassionally wine with a meal  . Drug use: No  . Sexual activity: Not Currently   Other Topics Concern  . Not on file   Social History Narrative   Patient drinks 2-3 cups of caffeine daily.   Patient is right handed.     Allergies  Allergen Reactions  . Penicillins Anaphylaxis    Has patient had a PCN reaction causing immediate rash, facial/tongue/throat swelling, SOB or lightheadedness with hypotension: Yes Has patient had a PCN reaction causing severe rash involving mucus membranes or skin necrosis:  Yes Has patient had a PCN reaction that required hospitalization Yes Has patient had a PCN reaction occurring within the last 10 years: No If all of the above answers are "NO", then may proceed with Cephalosporin use.yes  . Clarithromycin   . Flagyl [Metronidazole]     Current Outpatient Prescriptions  Medication Sig Dispense Refill  . acetaminophen (TYLENOL) 500 MG tablet Take 500 mg by mouth every 6 (six) hours as needed.    Marland Kitchen amLODipine (NORVASC) 5 MG tablet TAKE 1 TABLET (5 MG TOTAL) BY MOUTH DAILY. 30 tablet 1  . baclofen (LIORESAL) 10 MG tablet Take 0.5 tablets (5 mg total) by mouth at bedtime. 45 each 0  . Calcium-Vitamin D-Vitamin K 500-100-40 MG-UNT-MCG CHEW Chew 1 tablet by mouth 2 (two) times daily.     . carbidopa-levodopa (SINEMET IR) 25-100 MG tablet Take 0.5 tablets by mouth 3 (three) times daily. 135 tablet 3  . docusate  sodium (COLACE) 100 MG capsule Take 100 mg by mouth 3 (three) times daily as needed (constipation).     . Magnesium Oxide (PHILLIPS) 500 MG (LAX) TABS Take 1 tablet (500 mg total) by mouth daily as needed.  0  . meloxicam (MOBIC) 15 MG tablet TAKE 1 TABLET BY MOUTH DAILY 30 tablet 0  . mirabegron ER (MYRBETRIQ) 50 MG TB24 tablet Take 1 tablet (50 mg total) by mouth daily. 30 tablet 3  . Misc Natural Products (COSAMIN ASU ADVANCED FORMULA) CAPS Take 1 capsule by mouth 3 (three) times daily with meals.     . Multiple Vitamin (MULTIVITAMIN) tablet Take 1 tablet by mouth daily.      . nitroGLYCERIN (NITROSTAT) 0.4 MG SL tablet Place 0.4 mg under the tongue every 5 (five) minutes as needed. 1 tablet under the tongue at onset of chest pain: you may repeat every 5 minutes for up to 3 doses    . omeprazole (PRILOSEC) 40 MG capsule TAKE ONE CAPSULE BY MOUTH TWICE A DAY AS NEEDED 30 capsule 5  . Probiotic Product (HEALTHY COLON) CAPS Take 1 capsule by mouth daily.     . traMADol (ULTRAM) 50 MG tablet     . Wheat Dextrin (BENEFIBER) POWD Take 2 scoop by mouth 2 (two) times daily. Reported on 03/17/2016    . methocarbamol (ROBAXIN) 500 MG tablet Take 1 tablet (500 mg total) by mouth every 6 (six) hours as needed for muscle spasms. (Patient not taking: Reported on 12/09/2016) 60 tablet 0   No current facility-administered medications for this visit.     REVIEW OF SYSTEMS:  [X]  denotes positive finding, [ ]  denotes negative finding Cardiac  Comments:  Chest pain or chest pressure:    Shortness of breath upon exertion: X   Short of breath when lying flat:    Irregular heart rhythm:        Vascular    Pain in calf, thigh, or hip brought on by ambulation:    Pain in feet at night that wakes you up from your sleep:  X   Blood clot in your veins:    Leg swelling:         Pulmonary    Oxygen at home:    Productive cough:     Wheezing:         Neurologic    Sudden weakness in arms or legs:     Sudden  numbness in arms or legs:     Sudden onset of difficulty speaking or slurred speech:    Temporary  loss of vision in one eye:     Problems with dizziness:  X       Gastrointestinal    Blood in stool:     Vomited blood:         Genitourinary    Burning when urinating:     Blood in urine:        Psychiatric    Major depression:         Hematologic    Bleeding problems:    Problems with blood clotting too easily:        Skin    Rashes or ulcers:        Constitutional    Fever or chills:      PHYSICAL EXAM: Vitals:   12/09/16 1522 12/09/16 1525  BP: (!) 164/66 (!) 148/68  Pulse: 71   Resp: 18   Temp: 98 F (36.7 C)   TempSrc: Oral   SpO2: 98%   Weight: 136 lb (61.7 kg)   Height: 5\' 6"  (1.676 m)     GENERAL: The patient is a well-nourished female, in no acute distress. The vital signs are documented above. CARDIAC: There is a regular rate and rhythm.  VASCULAR: I do not detect carotid bruits. On the right side she has a palpable femoral and popliteal pulse. I cannot palpate pedal pulses because of the swelling but she does have a brisk biphasic dorsalis pedis signal with the Doppler. On the left side, she has a palpable femoral and popliteal pulse and a dorsalis pedis pulse. She has moderate bilateral lower extremity swelling which is more significant on the right side. PULMONARY: There is good air exchange bilaterally without wheezing or rales. ABDOMEN: Soft and non-tender with normal pitched bowel sounds.  MUSCULOSKELETAL: There are no major deformities or cyanosis. NEUROLOGIC: No focal weakness or paresthesias are detected. SKIN: There are no ulcers or rashes noted. PSYCHIATRIC: The patient has a normal affect.  DATA:   BILATERAL LOWER EXTREMITY VENOUS DUPLEX: I have independently interpreted her bilateral lower extremity venous duplex scan.  On the right side, there is no evidence of DVT or superficial thrombophlebitis. There is no deep vein reflux or  superficial vein reflux.  On the left side, there is no evidence of DVT or superficial thrombophlebitis. There is no deep vein reflux or superficial vein reflux on the left.  MEDICAL ISSUES:  BILATERAL LOWER EXTREMITY LYMPHEDEMA: Based on her exam, I think she has lymphedema bilaterally. Her swelling is more significant on the right side. There is no evidence of DVT and no evidence of venous insufficiency. We have discussed the importance of intermittent leg elevation. I do not think she is a good candidate for compression stockings given that it would be very difficult for her to put these on. However, I think her swelling can easily be controlled with elevator leg several times a day and we have discussed the proper positioning for this. I'll be happy to see her back at anytime if any new vascular issues arise.   Deitra Mayo [facility] [phone]

## 2016-12-18 ENCOUNTER — Other Ambulatory Visit: Payer: Self-pay | Admitting: Family Medicine

## 2017-01-03 ENCOUNTER — Other Ambulatory Visit: Payer: Self-pay | Admitting: Family Medicine

## 2017-01-10 DIAGNOSIS — I739 Peripheral vascular disease, unspecified: Secondary | ICD-10-CM | POA: Diagnosis not present

## 2017-01-12 DIAGNOSIS — R69 Illness, unspecified: Secondary | ICD-10-CM | POA: Diagnosis not present

## 2017-01-15 ENCOUNTER — Other Ambulatory Visit: Payer: Self-pay | Admitting: Family Medicine

## 2017-01-25 ENCOUNTER — Encounter (HOSPITAL_COMMUNITY): Payer: Self-pay | Admitting: Internal Medicine

## 2017-01-27 ENCOUNTER — Other Ambulatory Visit: Payer: Self-pay | Admitting: Family Medicine

## 2017-02-21 DIAGNOSIS — M1990 Unspecified osteoarthritis, unspecified site: Secondary | ICD-10-CM | POA: Diagnosis not present

## 2017-02-21 DIAGNOSIS — M25562 Pain in left knee: Secondary | ICD-10-CM | POA: Diagnosis not present

## 2017-02-21 DIAGNOSIS — M79642 Pain in left hand: Secondary | ICD-10-CM | POA: Diagnosis not present

## 2017-02-21 DIAGNOSIS — M79641 Pain in right hand: Secondary | ICD-10-CM | POA: Diagnosis not present

## 2017-02-21 DIAGNOSIS — M25561 Pain in right knee: Secondary | ICD-10-CM | POA: Diagnosis not present

## 2017-02-28 ENCOUNTER — Telehealth: Payer: Self-pay | Admitting: Neurology

## 2017-02-28 ENCOUNTER — Ambulatory Visit: Payer: Medicare HMO | Admitting: Neurology

## 2017-02-28 NOTE — Telephone Encounter (Signed)
This patient did not show for a revisit appointment today. 

## 2017-03-01 ENCOUNTER — Encounter: Payer: Self-pay | Admitting: Neurology

## 2017-03-03 ENCOUNTER — Telehealth: Payer: Self-pay | Admitting: Family Medicine

## 2017-03-19 DIAGNOSIS — R269 Unspecified abnormalities of gait and mobility: Secondary | ICD-10-CM | POA: Diagnosis not present

## 2017-03-19 DIAGNOSIS — I6789 Other cerebrovascular disease: Secondary | ICD-10-CM | POA: Diagnosis not present

## 2017-03-19 DIAGNOSIS — T1490XA Injury, unspecified, initial encounter: Secondary | ICD-10-CM | POA: Diagnosis not present

## 2017-04-04 DIAGNOSIS — I739 Peripheral vascular disease, unspecified: Secondary | ICD-10-CM | POA: Diagnosis not present

## 2017-04-06 ENCOUNTER — Ambulatory Visit (INDEPENDENT_AMBULATORY_CARE_PROVIDER_SITE_OTHER): Payer: Medicare HMO

## 2017-04-06 VITALS — BP 118/60 | HR 71 | Temp 98.9°F | Ht 66.0 in | Wt 139.0 lb

## 2017-04-06 DIAGNOSIS — Z0000 Encounter for general adult medical examination without abnormal findings: Secondary | ICD-10-CM

## 2017-04-06 NOTE — Progress Notes (Signed)
Subjective:   Kerry Flynn is a 83 y.o. female who presents for Medicare Annual (Subsequent) preventive examination.  Review of Systems:  Cardiac Risk Factors include: advanced age (>47men, >14 women);hypertension     Objective:     Vitals: BP 118/60   Pulse 71   Temp 98.9 F (37.2 C) (Oral)   Ht 5\' 6"  (1.676 m)   Wt 139 lb (63 kg)   SpO2 98%   BMI 22.44 kg/m   Body mass index is 22.44 kg/m.   Tobacco History  Smoking Status  . Former Smoker  . Packs/day: 0.25  . Years: 0.50  . Quit date: 12/27/1958  Smokeless Tobacco  . Never Used    Comment: only smoked for a few months     Counseling given: Not Answered   Past Medical History:  Diagnosis Date  . Abnormality of gait 11/17/2015  . Anemia   . Barrett's esophagus    Dr. Laural Golden, Dr. Moshe Cipro  . Benign recurrent vertigo   . Chronic constipation   . Chronic knee pain   . Colon polyps   . DDD (degenerative disc disease), cervical   . DDD (degenerative disc disease), lumbar   . Dermatitis    recurrent  . Diverticulosis   . 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 Fatigue    chronic  . GERD (gastroesophageal reflux disease)   . Head trauma    status post fall; upper and lower extremities   . Hemorrhoid   . Hemorrhoids   . Hip fracture, left ([place])    s/p left arthroplasty 2/16  . Hypertension   . Infertility, female   . Intermittent vertigo   . Osteoarthritis    of the neck  . Osteoporosis    knees  . Parkinson disease ([place]) 11/17/2015  . Peripheral edema    chronic  . Stable angina ([facility])   . Varicose veins   . Vitamin D deficiency    Past Surgical History:  Procedure Laterality Date  . CARDIAC CATHETERIZATION  09/02/2005   mild-mod calcification in mid LAD (20-30% luminal obstruction), otherwise normal coronaries, patent LE arteries (Dr. Jackie Plum)  . CAROTID DOPPLER  02/2008   R & L ICAs 0-49% diameter reduction  . CATARACT EXTRACTION W/ INTRAOCULAR LENS IMPLANT Left 01/06/2012   left,  [place]  . CHOLECYSTECTOMY    . CHOLECYSTECTOMY, LAPAROSCOPIC  06/17/2006   with stones  . COLONOSCOPY  04/19/2012   Procedure: COLONOSCOPY;  Surgeon: Rogene Houston, MD;  Location: [facility];  Service: Endoscopy;  Laterality: N/A;  1200  . COLONOSCOPY N/A 09/25/2015   Procedure: COLONOSCOPY;  Surgeon: Rogene Houston, MD;  Location: [facility];  Service: Endoscopy;  Laterality: N/A;  100  . COLONOSCOPY W/ POLYPECTOMY  05/29/1999, 01/12/2012  . dental implant   09/2014  . ESOPHAGOGASTRODUODENOSCOPY  01/12/2012  . EYE SURGERY  01/24/2012   bilateral cataract extraction  . HIP ARTHROPLASTY Left 01/31/2015   Procedure: ARTHROPLASTY BIPOLAR HIP;  Surgeon: Carole Civil, MD;  Location: [facility];  Service: Orthopedics;  Laterality: Left;  . KNEE ARTHROSCOPY Right 1999  . KNEE ARTHROSCOPY Left 1996  . NM MYOCAR PERF WALL MOTION  02/2012   lexiscan - small, fized apical lateral bowel attenuation artifact; no reversible ischemia; EF 77%; non-diagnostic for ischemia; low risk  . PALATE SURGERY  1988  . ROTATOR CUFF REPAIR  05/14/2010  . TONSILLECTOMY    . TONSILLECTOMY AND ADENOIDECTOMY  1934  . TRANSTHORACIC ECHOCARDIOGRAM  01/2012  EF=>55%; mild mitral annular calcification & mild MR; trace TR; trace pulm valve regurg  . UPPER GI ENDOSCOPY  12/12/2006   with biopsy, with colonoscopy - dx: Barrett's disease   Family History  Problem Relation Age of Onset  . Diabetes Mother   . Hypertension Mother   . CVA Mother   . Kidney disease Mother   . Heart failure Mother   . Arthritis Mother   . Cancer Father     larengeal  . Heart disease Father   . Congestive Heart Failure Father   . Bradycardia Father   . Hypotension Father   . Parkinson's disease Sister   . Osteoporosis Sister   . Stroke Maternal Grandmother   . Stroke Maternal Grandfather   . Colon cancer Neg Hx    History  Sexual Activity  . Sexual activity: Not Currently    Outpatient Encounter Prescriptions as of  04/06/2017  Medication Sig  . acetaminophen (TYLENOL) 500 MG tablet Take 500 mg by mouth every 6 (six) hours as needed.  Marland Kitchen amLODipine (NORVASC) 5 MG tablet TAKE 1 TABLET (5 MG TOTAL) BY MOUTH DAILY.  . Calcium-Vitamin D-Vitamin K 500-100-40 MG-UNT-MCG CHEW Chew 1 tablet by mouth 2 (two) times daily.   . carbidopa-levodopa (SINEMET IR) 25-100 MG tablet Take 0.5 tablets by mouth 3 (three) times daily.  Marland Kitchen docusate sodium (COLACE) 100 MG capsule Take 100 mg by mouth 3 (three) times daily as needed (constipation).   . Magnesium Oxide (PHILLIPS) 500 MG (LAX) TABS Take 1 tablet (500 mg total) by mouth daily as needed.  . meloxicam (MOBIC) 15 MG tablet TAKE 1 TABLET BY MOUTH DAILY  . Misc Natural Products (COSAMIN ASU ADVANCED FORMULA) CAPS Take 1 capsule by mouth 3 (three) times daily with meals.   . Multiple Vitamin (MULTIVITAMIN) tablet Take 1 tablet by mouth daily.    Marland Kitchen MYRBETRIQ 50 MG TB24 tablet TAKE 1 TABLET (50 MG TOTAL) BY MOUTH DAILY.  . nitroGLYCERIN (NITROSTAT) 0.4 MG SL tablet Place 0.4 mg under the tongue every 5 (five) minutes as needed. 1 tablet under the tongue at onset of chest pain: you may repeat every 5 minutes for up to 3 doses  . omeprazole (PRILOSEC) 40 MG capsule TAKE ONE CAPSULE BY MOUTH TWICE A DAY AS NEEDED  . Probiotic Product (HEALTHY COLON) CAPS Take 1 capsule by mouth daily.   . traMADol (ULTRAM) 50 MG tablet   . Wheat Dextrin (BENEFIBER) POWD Take 2 scoop by mouth 2 (two) times daily. Reported on 03/17/2016  . [DISCONTINUED] baclofen (LIORESAL) 10 MG tablet Take 0.5 tablets (5 mg total) by mouth at bedtime.  . [DISCONTINUED] methocarbamol (ROBAXIN) 500 MG tablet Take 1 tablet (500 mg total) by mouth every 6 (six) hours as needed for muscle spasms. (Patient not taking: Reported on 12/09/2016)   No facility-administered encounter medications on file as of 04/06/2017.     Activities of Daily Living In your present state of health, do you have any difficulty performing the  following activities: 04/06/2017  Hearing? Y  Vision? Y  Difficulty concentrating or making decisions? Y  Walking or climbing stairs? Y  Dressing or bathing? Y  Doing errands, shopping? Y  Preparing Food and eating ? Y  Using the Toilet? N  In the past six months, have you accidently leaked urine? Y  Do you have problems with loss of bowel control? N  Managing your Medications? Y  Managing your Finances? Y  Housekeeping or managing your Housekeeping? Darreld Mclean  Some recent data might be hidden    Patient Care Team: Fayrene Helper, MD as PCP - General Kathrynn Ducking, MD as Consulting Physician (Neurology)    Assessment:    Exercise Activities and Dietary recommendations Current Exercise Habits: Home exercise routine, Type of exercise: stretching;Other - see comments (balance exercises), Time (Minutes): 60, Frequency (Times/Week): 7, Weekly Exercise (Minutes/Week): 420, Intensity: Mild, Exercise limited by: neurologic condition(s)  Goals    . Maintain current exercise program      Fall Risk Fall Risk  04/06/2017 08/05/2016 07/03/2015 05/05/2015 05/02/2015  Falls in the past year? Yes No - Yes Yes  Number falls in past yr: 2 or more - - 1 1  Injury with Fall? No - - No Yes  Risk Factor Category  High Fall Risk - - High Fall Risk High Fall Risk  Risk for fall due to : History of fall(s);Impaired balance/gait;Impaired mobility - History of fall(s) - History of fall(s)  Follow up Falls evaluation completed;Education provided;Falls prevention discussed - Falls evaluation completed;Falls prevention discussed - Falls evaluation completed;Falls prevention discussed;Education provided   Depression Screen PHQ 2/9 Scores 04/06/2017 08/05/2016 07/03/2015 05/02/2015  PHQ - 2 Score 2 1 2 2   PHQ- 9 Score 2 3 7 2      Cognitive Function MMSE - Mini Mental State Exam 08/05/2016  Orientation to time 5  Orientation to Place 5  Registration 3  Attention/ Calculation 5  Recall 3  Language- name 2 objects 2   Language- repeat 1  Language- follow 3 step command 3  Language- read & follow direction 1  Write a sentence 1  Copy design 1  Total score 30     6CIT Screen 04/06/2017  What Year? 0 points  What month? 0 points  What time? 0 points  Count back from 20 0 points  Months in reverse 0 points  Repeat phrase 0 points  Total Score 0    Immunization History  Administered Date(s) Administered  . HiB (PRP-OMP) 11/14/2007  . Influenza Split 11/22/2011  . Influenza Whole 11/14/2007, 09/23/2009, 09/16/2010  . Influenza,inj,Quad PF,36+ Mos 10/23/2013, 12/31/2014, 08/27/2015  . Pneumococcal Conjugate-13 05/05/2015  . Pneumococcal Polysaccharide-23 12/03/2013  . Tdap 11/04/2007   Screening Tests Health Maintenance  Topic Date Due  . INFLUENZA VACCINE  07/27/2017  . TETANUS/TDAP  11/03/2017  . COLONOSCOPY  09/24/2018  . DEXA SCAN  Completed  . PNA vac Low Risk Adult  Completed      Plan:  I have personally reviewed and addressed the Medicare Annual Wellness questionnaire and have noted the following in the patient's chart:  A. Medical and social history B. Use of alcohol, tobacco or illicit drugs  C. Current medications and supplements D. Functional ability and status E.  Nutritional status F.  Physical activity G. Advance directives H. List of other physicians I.  Hospitalizations, surgeries, and ER visits in previous 12 months J.  [place] to include cognitive, depression, and falls L. Referrals and appointments - none  In addition, I have reviewed and discussed with patient certain preventive protocols, quality metrics, and best practice recommendations. A written personalized care plan for preventive services as well as general preventive health recommendations were provided to patient.  Signed,   Stormy Fabian, LPN Lead Nurse Health Advisor

## 2017-04-06 NOTE — Patient Instructions (Addendum)
[name] , Thank you for taking time to come for your Medicare Wellness Visit. I appreciate your ongoing commitment to your health goals. Please review the following plan we discussed and let me know if I can assist you in the future.   Screening recommendations/referrals: Colonoscopy: Up to date and no longer required Mammogram: No longer required Bone Density: Up to date Recommended yearly ophthalmology/optometry visit for glaucoma screening and checkup Recommended yearly dental visit for hygiene and checkup  Vaccinations: Influenza vaccine: Next due 07/2017 Pneumococcal vaccine: Up to date Tdap vaccine: Up to date, next due 10/2017 Shingles vaccine: Due now, patient declines    Advanced directives: Please bring a copy of your POA (Power of [place]) and/or Living Will to your next appointment.   Next appointment: Follow up in 1 month with Dr. Moshe Cipro. Follow up in 1 year for your annual wellness visit.   Preventive Care 62 Years and Older, Female Preventive care refers to lifestyle choices and visits with your health care provider that can promote health and wellness. What does preventive care include?  A yearly physical exam. This is also called an annual well check.  Dental exams once or twice a year.  Routine eye exams. Ask your health care provider how often you should have your eyes checked.  Personal lifestyle choices, including:  Daily care of your teeth and gums.  Regular physical activity.  Eating a healthy diet.  Avoiding tobacco and drug use.  Limiting alcohol use.  Practicing safe sex.  Taking low-dose aspirin every day.  Taking vitamin and mineral supplements as recommended by your health care provider. What happens during an annual well check? The services and screenings done by your health care provider during your annual well check will depend on your age, overall health, lifestyle risk factors, and family history of disease. Counseling  Your  health care provider may ask you questions about your:  Alcohol use.  Tobacco use.  Drug use.  Emotional well-being.  Home and relationship well-being.  Sexual activity.  Eating habits.  History of falls.  Memory and ability to understand (cognition).  Work and work Statistician.  Reproductive health. Screening  You may have the following tests or measurements:  Height, weight, and BMI.  Blood pressure.  Lipid and cholesterol levels. These may be checked every 5 years, or more frequently if you are over 59 years old.  Skin check.  Lung cancer screening. You may have this screening every year starting at age 28 if you have a 30-pack-year history of smoking and currently smoke or have quit within the past 15 years.  Fecal occult blood test (FOBT) of the stool. You may have this test every year starting at age 58.  Flexible sigmoidoscopy or colonoscopy. You may have a sigmoidoscopy every 5 years or a colonoscopy every 10 years starting at age 85.  Hepatitis C blood test.  Hepatitis B blood test.  Sexually transmitted disease (STD) testing.  Diabetes screening. This is done by checking your blood sugar (glucose) after you have not eaten for a while (fasting). You may have this done every 1-3 years.  Bone density scan. This is done to screen for osteoporosis. You may have this done starting at age 41.  Mammogram. This may be done every 1-2 years. Talk to your health care provider about how often you should have regular mammograms. Talk with your health care provider about your test results, treatment options, and if necessary, the need for more tests. Vaccines  Your  health care provider may recommend certain vaccines, such as:  Influenza vaccine. This is recommended every year.  Tetanus, diphtheria, and acellular pertussis (Tdap, Td) vaccine. You may need a Td booster every 10 years.  Zoster vaccine. You may need this after age 40.  Pneumococcal 13-valent  conjugate (PCV13) vaccine. One dose is recommended after age 41.  Pneumococcal polysaccharide (PPSV23) vaccine. One dose is recommended after age 13. Talk to your health care provider about which screenings and vaccines you need and how often you need them. This information is not intended to replace advice given to you by your health care provider. Make sure you discuss any questions you have with your health care provider. Document Released: 01/09/2016 Document Revised: 09/01/2016 Document Reviewed: 10/14/2015 Elsevier Interactive Patient Education  2017 [place] Prevention in the Home Falls can cause injuries. They can happen to people of all ages. There are many things you can do to make your home safe and to help prevent falls. What can I do on the outside of my home?  Regularly fix the edges of walkways and driveways and fix any cracks.  Remove anything that might make you trip as you walk through a door, such as a raised step or threshold.  Trim any bushes or trees on the path to your home.  Use bright outdoor lighting.  Clear any walking paths of anything that might make someone trip, such as rocks or tools.  Regularly check to see if handrails are loose or broken. Make sure that both sides of any steps have handrails.  Any raised decks and porches should have guardrails on the edges.  Have any leaves, snow, or ice cleared regularly.  Use sand or salt on walking paths during winter.  Clean up any spills in your garage right away. This includes oil or grease spills. What can I do in the bathroom?  Use night lights.  Install grab bars by the toilet and in the tub and shower. Do not use towel bars as grab bars.  Use non-skid mats or decals in the tub or shower.  If you need to sit down in the shower, use a plastic, non-slip stool.  Keep the floor dry. Clean up any water that spills on the floor as soon as it happens.  Remove soap buildup in the tub or  shower regularly.  Attach bath mats securely with double-sided non-slip rug tape.  Do not have throw rugs and other things on the floor that can make you trip. What can I do in the bedroom?  Use night lights.  Make sure that you have a light by your bed that is easy to reach.  Do not use any sheets or blankets that are too big for your bed. They should not hang down onto the floor.  Have a firm chair that has side arms. You can use this for support while you get dressed.  Do not have throw rugs and other things on the floor that can make you trip. What can I do in the kitchen?  Clean up any spills right away.  Avoid walking on wet floors.  Keep items that you use a lot in easy-to-reach places.  If you need to reach something above you, use a strong step stool that has a grab bar.  Keep electrical cords out of the way.  Do not use floor polish or wax that makes floors slippery. If you must use wax, use non-skid floor wax.  Do not  have throw rugs and other things on the floor that can make you trip. What can I do with my stairs?  Do not leave any items on the stairs.  Make sure that there are handrails on both sides of the stairs and use them. Fix handrails that are broken or loose. Make sure that handrails are as long as the stairways.  Check any carpeting to make sure that it is firmly attached to the stairs. Fix any carpet that is loose or worn.  Avoid having throw rugs at the top or bottom of the stairs. If you do have throw rugs, attach them to the floor with carpet tape.  Make sure that you have a light switch at the top of the stairs and the bottom of the stairs. If you do not have them, ask someone to add them for you. What else can I do to help prevent falls?  Wear shoes that:  Do not have high heels.  Have rubber bottoms.  Are comfortable and fit you well.  Are closed at the toe. Do not wear sandals.  If you use a stepladder:  Make sure that it is fully  opened. Do not climb a closed stepladder.  Make sure that both sides of the stepladder are locked into place.  Ask someone to hold it for you, if possible.  Clearly mark and make sure that you can see:  Any grab bars or handrails.  First and last steps.  Where the edge of each step is.  Use tools that help you move around (mobility aids) if they are needed. These include:  Canes.  Walkers.  Scooters.  Crutches.  Turn on the lights when you go into a dark area. Replace any light bulbs as soon as they burn out.  Set up your furniture so you have a clear path. Avoid moving your furniture around.  If any of your floors are uneven, fix them.  If there are any pets around you, be aware of where they are.  Review your medicines with your doctor. Some medicines can make you feel dizzy. This can increase your chance of falling. Ask your doctor what other things that you can do to help prevent falls. This information is not intended to replace advice given to you by your health care provider. Make sure you discuss any questions you have with your health care provider. Document Released: 10/09/2009 Document Revised: 05/20/2016 Document Reviewed: 01/17/2015 Elsevier Interactive Patient Education  2017 [employer].

## 2017-04-19 ENCOUNTER — Ambulatory Visit: Payer: 59

## 2017-04-19 DIAGNOSIS — T1490XA Injury, unspecified, initial encounter: Secondary | ICD-10-CM | POA: Diagnosis not present

## 2017-05-02 ENCOUNTER — Ambulatory Visit (INDEPENDENT_AMBULATORY_CARE_PROVIDER_SITE_OTHER): Payer: Medicare HMO | Admitting: Family Medicine

## 2017-05-02 ENCOUNTER — Encounter: Payer: Self-pay | Admitting: Family Medicine

## 2017-05-02 ENCOUNTER — Telehealth: Payer: Self-pay

## 2017-05-02 VITALS — BP 132/70 | HR 68 | Resp 16 | Ht 66.0 in | Wt 136.0 lb

## 2017-05-02 DIAGNOSIS — L97511 Non-pressure chronic ulcer of other part of right foot limited to breakdown of skin: Secondary | ICD-10-CM | POA: Diagnosis not present

## 2017-05-02 DIAGNOSIS — M159 Polyosteoarthritis, unspecified: Secondary | ICD-10-CM

## 2017-05-02 DIAGNOSIS — G8929 Other chronic pain: Secondary | ICD-10-CM | POA: Diagnosis not present

## 2017-05-02 DIAGNOSIS — E042 Nontoxic multinodular goiter: Secondary | ICD-10-CM | POA: Diagnosis not present

## 2017-05-02 DIAGNOSIS — L89892 Pressure ulcer of other site, stage 2: Secondary | ICD-10-CM | POA: Diagnosis not present

## 2017-05-02 DIAGNOSIS — K219 Gastro-esophageal reflux disease without esophagitis: Secondary | ICD-10-CM | POA: Diagnosis not present

## 2017-05-02 DIAGNOSIS — M79674 Pain in right toe(s): Secondary | ICD-10-CM

## 2017-05-02 DIAGNOSIS — N3941 Urge incontinence: Secondary | ICD-10-CM | POA: Diagnosis not present

## 2017-05-02 DIAGNOSIS — I739 Peripheral vascular disease, unspecified: Secondary | ICD-10-CM | POA: Diagnosis not present

## 2017-05-02 DIAGNOSIS — E559 Vitamin D deficiency, unspecified: Secondary | ICD-10-CM | POA: Diagnosis not present

## 2017-05-02 DIAGNOSIS — I10 Essential (primary) hypertension: Secondary | ICD-10-CM

## 2017-05-02 DIAGNOSIS — G20 Parkinson's disease: Secondary | ICD-10-CM

## 2017-05-02 DIAGNOSIS — L97509 Non-pressure chronic ulcer of other part of unspecified foot with unspecified severity: Secondary | ICD-10-CM | POA: Insufficient documentation

## 2017-05-02 MED ORDER — DOXYCYCLINE HYCLATE 100 MG PO TABS: 100.0000 mg | ORAL_TABLET | Freq: Two times a day (BID) | ORAL | 0 refills | Status: DC

## 2017-05-02 NOTE — Telephone Encounter (Signed)
Please work in today for toe ulcer. [phone]. Please call and make patient aware of what time to come in. (around 1:45) this pm

## 2017-05-02 NOTE — Patient Instructions (Addendum)
Cancel may appointment, follow up appointment in early September  You are referred to Dr Berline Lopes re right 2nd toe pain and ulcer  Keep appointment with Dr Jannifer Franklin  Fasting lipid, cmp, cBC, TSH, vit D  1 week beofre September  We will ensure Your niece, Shirlean Mylar is named in your medical record   Thanks for choosing [facility], we consider it a privelige to serve you.`

## 2017-05-07 NOTE — Assessment & Plan Note (Signed)
Managed by neurology , and on medication , will reschedule recently missed appointment

## 2017-05-07 NOTE — Assessment & Plan Note (Signed)
Controlled, no change in medication  

## 2017-05-07 NOTE — Assessment & Plan Note (Addendum)
Tylenol for pain management and meloxicam

## 2017-05-07 NOTE — Assessment & Plan Note (Signed)
Managed with medication 

## 2017-05-07 NOTE — Progress Notes (Signed)
   Kerry Flynn     MRN: [id number]      DOB: 06-Jan-1927   HPI Kerry Flynn is here with concern of a painful right 2nd toe which is weeping, has been present for probably over 1 week. The toe is deformed and prone to trauma. Became extremely painful this morning, and her niece visiting her this morning decided to bring her in to be seen. Her mobility is becoming an increasing challenge, and home safety, especially at night when she needs to get up to urinate, there is concern about potential falls, and cost of sitting is high ROS Denies recent fever or chills. Denies sinus pressure, nasal congestion, ear pain or sore throat. Denies chest congestion, productive cough or wheezing. Denies chest pains, palpitations and leg swelling Denies abdominal pain, nausea, vomiting,diarrhea or constipation.   Denies dysuria, does have incontinence and frequency. c/o chronic  joint pain, swelling and limitation in mobility. Denies headaches, seizures, numbness, or tingling. Denies depression, anxiety or insomnia. Marland Kitchen   PE  BP 132/70   Pulse 68   Resp 16   Ht 5\' 6"  (1.676 m)   Wt 136 lb (61.7 kg)   SpO2 97%   BMI 21.95 kg/m   Patient alert and oriented and in no cardiopulmonary distress.  HEENT: No facial asymmetry, EOMI,   oropharynx pink and moist.  Neck decreased ROM  Chest: Clear to auscultation bilaterally.  CVS: S1, S2 no murmurs, no S3.Regular rate.  ABD: Soft non tender.   Ext: No edema  MS: Decreased  ROM spine, shoulders, hips and knees.  Skin: Erythema of 2nd left toe with swelling and skin abrasion and weeping.  Psych: Good eye contact, normal affect. Memory intact not anxious or depressed appearing.  CNS: CN 2-12 intact, Assessment & Plan  Toe ulcer (HCC) Stage 1 ulcer of right 2nd toe,  Keep clean and apply dry gauze loosely, 1 week antibiotic course. Podiatry to evaluate as high risk of complication due to chronic toe deformity present  Essential  hypertension Controlled, no change in medication   Parkinson disease ([place]) Managed by neurology , and on medication , will reschedule recently missed appointment  Osteoarthritis, generalized Tylenol for pain management and meloxicam  Urinary incontinence, urge Managed with medication   GERD (gastroesophageal reflux disease) Controlled, no change in medication   Colon polyps Followed by GI  Multiple thyroid nodules Re imaging in 1 year of nodule recommended, pt to be contacted

## 2017-05-07 NOTE — Assessment & Plan Note (Signed)
Followed by GI

## 2017-05-07 NOTE — Assessment & Plan Note (Signed)
Re imaging in 1 year of nodule recommended, pt to be contacted

## 2017-05-07 NOTE — Assessment & Plan Note (Signed)
Stage 1 ulcer of right 2nd toe,  Keep clean and apply dry gauze loosely, 1 week antibiotic course. Podiatry to evaluate as high risk of complication due to chronic toe deformity present

## 2017-05-11 ENCOUNTER — Encounter: Payer: Self-pay | Admitting: Neurology

## 2017-05-11 ENCOUNTER — Ambulatory Visit (INDEPENDENT_AMBULATORY_CARE_PROVIDER_SITE_OTHER): Payer: Medicare HMO | Admitting: Neurology

## 2017-05-11 VITALS — BP 131/62 | HR 66 | Wt 136.0 lb

## 2017-05-11 DIAGNOSIS — R29898 Other symptoms and signs involving the musculoskeletal system: Secondary | ICD-10-CM | POA: Diagnosis not present

## 2017-05-11 DIAGNOSIS — G4762 Sleep related leg cramps: Secondary | ICD-10-CM | POA: Diagnosis not present

## 2017-05-11 DIAGNOSIS — R269 Unspecified abnormalities of gait and mobility: Secondary | ICD-10-CM

## 2017-05-11 DIAGNOSIS — G20 Parkinson's disease: Secondary | ICD-10-CM

## 2017-05-11 MED ORDER — CARBIDOPA-LEVODOPA 25-100 MG PO TABS: 1.0000 | ORAL_TABLET | Freq: Three times a day (TID) | ORAL | 1 refills | Status: DC

## 2017-05-11 NOTE — Patient Instructions (Signed)
   We will go up on the sinemet (carbidopa) to one full tablet three times a day. We will get physical therapy set up.

## 2017-05-11 NOTE — Progress Notes (Signed)
Reason for visit: Parkinson's disease  Kerry Flynn is an 83 y.o. female  History of present illness:  Kerry Flynn is an 83 year old right-handed white female with a history of Parkinson's disease with a severe gait disorder. The patient is on very low-dose Sinemet taking only one half of a 25/100 mg tablet 3 times daily. The patient missed her appointment in March 2018. The patient has begun falling in March, she has fallen on at least 4 occasions, the last fall occurred 3 days ago and was associated with an injury to the left shoulder. The patient has a tendency to lean backwards, she uses a walker which does help some. The patient has a lift chair at home, but she has a multilevel house with the bedrooms and bathrooms on the upper level, she uses a stair lift for this, but she is having a lot of problems getting in and out of the chair. There are no bathrooms on the main level where the kitchen is. She does have some assistance during the day. The patient has not had physical therapy recently. She is contemplating what she will do about her living situation. The patient also has a dropped head syndrome. She reports problems with urinary frequency at night, and she has nocturnal leg cramps. She has been placed on baclofen, but this worsened her balance and she went off the medication.   Past Medical History:  Diagnosis Date  . Abnormality of gait 11/17/2015  . Anemia   . Barrett's esophagus    Dr. Laural Golden, Dr. Moshe Cipro  . Benign recurrent vertigo   . Chronic constipation   . Chronic knee pain   . Colon polyps   . DDD (degenerative disc disease), cervical   . DDD (degenerative disc disease), lumbar   . Dermatitis    recurrent  . Diverticulosis   . 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 Fatigue    chronic  . GERD (gastroesophageal reflux disease)   . Head trauma    status post fall; upper and lower extremities   . Hemorrhoid   . Hemorrhoids   . Hip fracture, left ([place])    s/p  left arthroplasty 2/16  . Hypertension   . Infertility, female   . Intermittent vertigo   . Osteoarthritis    of the neck  . Osteoporosis    knees  . Parkinson disease ([place]) 11/17/2015  . Peripheral edema    chronic  . Stable angina (HCC)   . Varicose veins   . Vitamin D deficiency     Past Surgical History:  Procedure Laterality Date  . CARDIAC CATHETERIZATION  09/02/2005   mild-mod calcification in mid LAD (20-30% luminal obstruction), otherwise normal coronaries, patent LE arteries (Dr. Jackie Plum)  . CAROTID DOPPLER  02/2008   R & L ICAs 0-49% diameter reduction  . CATARACT EXTRACTION W/ INTRAOCULAR LENS IMPLANT Left 01/06/2012   left, [place]  . CHOLECYSTECTOMY    . CHOLECYSTECTOMY, LAPAROSCOPIC  06/17/2006   with stones  . COLONOSCOPY  04/19/2012   Procedure: COLONOSCOPY;  Surgeon: Rogene Houston, MD;  Location: [facility];  Service: Endoscopy;  Laterality: N/A;  1200  . COLONOSCOPY N/A 09/25/2015   Procedure: COLONOSCOPY;  Surgeon: Rogene Houston, MD;  Location: [facility];  Service: Endoscopy;  Laterality: N/A;  100  . COLONOSCOPY W/ POLYPECTOMY  05/29/1999, 01/12/2012  . dental implant   09/2014  . ESOPHAGOGASTRODUODENOSCOPY  01/12/2012  . EYE SURGERY  01/24/2012   bilateral cataract  extraction  . HIP ARTHROPLASTY Left 01/31/2015   Procedure: ARTHROPLASTY BIPOLAR HIP;  Surgeon: Carole Civil, MD;  Location: [facility] ORS;  Service: Orthopedics;  Laterality: Left;  . KNEE ARTHROSCOPY Right 1999  . KNEE ARTHROSCOPY Left 1996  . NM MYOCAR PERF WALL MOTION  02/2012   lexiscan - small, fized apical lateral bowel attenuation artifact; no reversible ischemia; EF 77%; non-diagnostic for ischemia; low risk  . PALATE SURGERY  1988  . ROTATOR CUFF REPAIR  05/14/2010  . TONSILLECTOMY    . TONSILLECTOMY AND ADENOIDECTOMY  1934  . TRANSTHORACIC ECHOCARDIOGRAM  01/2012   EF=>55%; mild mitral annular calcification & mild MR; trace TR; trace pulm valve regurg  . UPPER GI ENDOSCOPY   12/12/2006   with biopsy, with colonoscopy - dx: Barrett's disease    Family History  Problem Relation Age of Onset  . Diabetes Mother   . Hypertension Mother   . CVA Mother   . Kidney disease Mother   . Heart failure Mother   . Arthritis Mother   . Cancer Father        larengeal  . Heart disease Father   . Congestive Heart Failure Father   . Bradycardia Father   . Hypotension Father   . Parkinson's disease Sister   . Osteoporosis Sister   . Stroke Maternal Grandmother   . Stroke Maternal Grandfather   . Colon cancer Neg Hx     Social history:  reports that she quit smoking about 58 years ago. She has a 0.12 pack-year smoking history. She has never used smokeless tobacco. She reports that she does not drink alcohol or use drugs.    Allergies  Allergen Reactions  . Penicillins Anaphylaxis    Has patient had a PCN reaction causing immediate rash, facial/tongue/throat swelling, SOB or lightheadedness with hypotension: Yes Has patient had a PCN reaction causing severe rash involving mucus membranes or skin necrosis: Yes Has patient had a PCN reaction that required hospitalization Yes Has patient had a PCN reaction occurring within the last 10 years: No If all of the above answers are "NO", then may proceed with Cephalosporin use.yes  . Clarithromycin   . Flagyl [Metronidazole]     Medications:  Prior to Admission medications   Medication Sig Start Date End Date Taking? Authorizing Provider  acetaminophen (TYLENOL) 500 MG tablet Take 500 mg by mouth every 6 (six) hours as needed.   Yes [provider]  amLODipine (NORVASC) 5 MG tablet TAKE 1 TABLET (5 MG TOTAL) BY MOUTH DAILY. 12/21/16  Yes Fayrene Helper, MD  carbidopa-levodopa (SINEMET IR) 25-100 MG tablet Take 0.5 tablets by mouth 3 (three) times daily. 10/04/16  Yes Kathrynn Ducking, MD  docusate sodium (COLACE) 100 MG capsule Take 100 mg by mouth 3 (three) times daily as needed (constipation).    Yes  [provider]  Magnesium Oxide (PHILLIPS) 500 MG (LAX) TABS Take 1 tablet (500 mg total) by mouth daily as needed. 02/03/15  Yes Black, Lezlie Octave, NP  meloxicam (MOBIC) 15 MG tablet TAKE 1 TABLET BY MOUTH DAILY 01/27/17  Yes Fayrene Helper, MD  Misc Natural Products (COSAMIN ASU ADVANCED FORMULA) CAPS Take 1 capsule by mouth 3 (three) times daily with meals.    Yes [provider]  MYRBETRIQ 50 MG TB24 tablet TAKE 1 TABLET (50 MG TOTAL) BY MOUTH DAILY. 01/17/17  Yes Fayrene Helper, MD  nitroGLYCERIN (NITROSTAT) 0.4 MG SL tablet Place 0.4 mg under the tongue every 5 (  five) minutes as needed. 1 tablet under the tongue at onset of chest pain: you may repeat every 5 minutes for up to 3 doses   Yes [provider]  omeprazole (PRILOSEC) 40 MG capsule TAKE ONE CAPSULE BY MOUTH TWICE A DAY AS NEEDED 10/13/15  Yes Fayrene Helper, MD  Probiotic Product (HEALTHY COLON) CAPS Take 1 capsule by mouth daily.    Yes [provider]  silver sulfADIAZINE (SILVADENE) 1 % cream APPLY CREAM TO SKIN ONCE A DAY 05/02/17  Yes [provider]  traMADol (ULTRAM) 50 MG tablet  09/15/16  Yes [provider]  Calcium-Vitamin D-Vitamin K 500-100-40 MG-UNT-MCG CHEW Chew 1 tablet by mouth 2 (two) times daily.     [provider]    ROS:  Out of a complete 14 system review of symptoms, the patient complains only of the following symptoms, and all other reviewed systems are negative.  Decreased activity, fatigue Blurred vision Leg swelling Incontinence of the bowels Restless legs, frequent waking, daytime sleepiness Incontinence of the bladder, frequency of urination Joint pain, joint swelling, back pain, aching muscles, muscle cramps, walking difficulty, neck pain, neck stiffness Dizziness, weakness Agitation, decreased concentration  Blood pressure 131/62, pulse 66, weight 136 lb (61.7 kg).  Physical Exam  General: The patient is alert and  cooperative at the time of the examination.  Neuromuscular: The patient has pain and decreased range of movement of the left shoulder. With movement across the left knee, there is significant crepitus.  Skin: No significant peripheral edema is noted.   Neurologic Exam  Mental status: The patient is alert and oriented x 3 at the time of the examination. The patient has apparent normal recent and remote memory, with an apparently normal attention span and concentration ability.   Cranial nerves: Facial symmetry is present. Speech is normal, no aphasia or dysarthria is noted. Extraocular movements are full. Visual fields are full. The patient has significant flexion of the neck.  Motor: The patient has good strength in all 4 extremities.  Sensory examination: Soft touch sensation is symmetric on the face, arms, and legs.  Coordination: The patient has good finger-nose-finger and heel-to-shin bilaterally.  Gait and station: The patient requires full assistance with standing, once up, she cannot support her own weight, she has a tendency to lean backwards, she cannot functionally ambulate.  Reflexes: Deep tendon reflexes are symmetric.   Assessment/Plan:  1. Parkinson's disease  2. Severe gait disorder  3. Dropped head syndrome  4. Nocturnal leg cramps  The patient is unsafe in her current living situation, she is having multiple falls. The patient may need to consider a transition to an extended care facility. The patient will be increased on the Sinemet taking one full tablet of the 25/100 mg tablets 3 times daily. Home health physical therapy will be set up. The patient will follow-up in 3 months.  Jill Alexanders MD 05/11/2017 4:11 PM  [facility] [address] [place], [place] [address]  Phone [phone] Fax [phone]

## 2017-05-12 ENCOUNTER — Ambulatory Visit (HOSPITAL_COMMUNITY): Payer: Medicare HMO

## 2017-05-12 ENCOUNTER — Telehealth: Payer: Self-pay | Admitting: Family Medicine

## 2017-05-12 NOTE — Telephone Encounter (Signed)
Patients care giver Jenny Reichmann called this morning, stated patient wanted her ultrasound canceled for today because patient fell again on Tuesday and is having a hard time getting in and out of the car.  Also, she wanted Dr.Simpson to know that Dr.Willis "upped" the dosages on rubys medicatons at her last office visit.  Cb#: [phone]

## 2017-05-12 NOTE — Telephone Encounter (Signed)
Please provide her with central scheduling phone number to call and reschedule and will need to bring all medications with her to next appointment.

## 2017-05-16 DIAGNOSIS — I739 Peripheral vascular disease, unspecified: Secondary | ICD-10-CM | POA: Diagnosis not present

## 2017-05-17 ENCOUNTER — Ambulatory Visit: Payer: 59 | Admitting: Family Medicine

## 2017-05-18 ENCOUNTER — Telehealth: Payer: Self-pay | Admitting: Orthopedic Surgery

## 2017-05-18 NOTE — Telephone Encounter (Signed)
Patient called and stated she fell last week or so ago and her shoulder was hurting.  She wanted to see Dr. Aline Brochure.  I told her that Dr. Aline Brochure did not have any opening until at least next week or the week after.  I further explained to her that Dr. Aline Brochure is only here about 2 1/2 days a week.  I offered to let her come in to see Dr. Luna Glasgow tomorrow morning but she declined.  She said she would call her PCP, Dr. Moshe Cipro to see what she needs to do.

## 2017-05-19 ENCOUNTER — Ambulatory Visit (INDEPENDENT_AMBULATORY_CARE_PROVIDER_SITE_OTHER): Payer: Medicare HMO | Admitting: Orthopaedic Surgery

## 2017-05-19 ENCOUNTER — Encounter: Payer: Self-pay | Admitting: Orthopaedic Surgery

## 2017-05-19 ENCOUNTER — Ambulatory Visit (INDEPENDENT_AMBULATORY_CARE_PROVIDER_SITE_OTHER): Payer: Medicare HMO

## 2017-05-19 VITALS — BP 126/62 | HR 75 | Temp 96.8°F

## 2017-05-19 DIAGNOSIS — M25512 Pain in left shoulder: Secondary | ICD-10-CM

## 2017-05-19 NOTE — Progress Notes (Signed)
Patient Kerry Flynn, female DOB:[birth year], 83 y.o. WNU:[id number]  Chief Complaint  Patient presents with  . Shoulder Pain    left shoulder pain s/p fall 05/16/17    HPI  Kerry Flynn is a 83 y.o. female who fell at home on 05-16-17 and hurt her left shoulder.  She uses a walker.  She has painful motion of the shoulder and bruising present.  She had no other injury. HPI  There is no height or weight on file to calculate BMI.  ROS  Review of Systems  HENT: Negative for congestion.   Respiratory: Negative for cough and shortness of breath.   Cardiovascular: Negative for chest pain and leg swelling.  Endocrine: Positive for cold intolerance.  Musculoskeletal: Positive for arthralgias and joint swelling.  Allergic/Immunologic: Positive for environmental allergies.    Past Medical History:  Diagnosis Date  . Abnormality of gait 11/17/2015  . Anemia   . Barrett's esophagus    Dr. Laural Golden, Dr. Moshe Cipro  . Benign recurrent vertigo   . Chronic constipation   . Chronic knee pain   . Colon polyps   . DDD (degenerative disc disease), cervical   . DDD (degenerative disc disease), lumbar   . Dermatitis    recurrent  . Diverticulosis   . 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 Fatigue    chronic  . GERD (gastroesophageal reflux disease)   . Head trauma    status post fall; upper and lower extremities   . Hemorrhoid   . Hemorrhoids   . Hip fracture, left ([place])    s/p left arthroplasty 2/16  . Hypertension   . Infertility, female   . Intermittent vertigo   . Osteoarthritis    of the neck  . Osteoporosis    knees  . Parkinson disease ([place]) 11/17/2015  . Peripheral edema    chronic  . Stable angina ([facility])   . Varicose veins   . Vitamin D deficiency     Past Surgical History:  Procedure Laterality Date  . CARDIAC CATHETERIZATION  09/02/2005   mild-mod calcification in mid LAD (20-30% luminal obstruction), otherwise normal coronaries, patent LE arteries (Dr. Jackie Plum)   . CAROTID DOPPLER  02/2008   R & L ICAs 0-49% diameter reduction  . CATARACT EXTRACTION W/ INTRAOCULAR LENS IMPLANT Left 01/06/2012   left, [place]  . CHOLECYSTECTOMY    . CHOLECYSTECTOMY, LAPAROSCOPIC  06/17/2006   with stones  . COLONOSCOPY  04/19/2012   Procedure: COLONOSCOPY;  Surgeon: Rogene Houston, MD;  [facility];  Service: Endoscopy;  Laterality: N/A;  1200  . COLONOSCOPY N/A 09/25/2015   Procedure: COLONOSCOPY;  Surgeon: Rogene Houston, MD;  Location: [facility];  Service: Endoscopy;  Laterality: N/A;  100  . COLONOSCOPY W/ POLYPECTOMY  05/29/1999, 01/12/2012  . dental implant   09/2014  . ESOPHAGOGASTRODUODENOSCOPY  01/12/2012  . EYE SURGERY  01/24/2012   bilateral cataract extraction  . HIP ARTHROPLASTY Left 01/31/2015   Procedure: ARTHROPLASTY BIPOLAR HIP;  Surgeon: Carole Civil, MD;  Location: [facility];  Service: Orthopedics;  Laterality: Left;  . KNEE ARTHROSCOPY Right 1999  . KNEE ARTHROSCOPY Left 1996  . NM MYOCAR PERF WALL MOTION  02/2012   lexiscan - small, fized apical lateral bowel attenuation artifact; no reversible ischemia; EF 77%; non-diagnostic for ischemia; low risk  . PALATE SURGERY  1988  . ROTATOR CUFF REPAIR  05/14/2010  . TONSILLECTOMY    . TONSILLECTOMY AND ADENOIDECTOMY  1934  . TRANSTHORACIC  ECHOCARDIOGRAM  01/2012   EF=>55%; mild mitral annular calcification & mild MR; trace TR; trace pulm valve regurg  . UPPER GI ENDOSCOPY  12/12/2006   with biopsy, with colonoscopy - dx: Barrett's disease    Family History  Problem Relation Age of Onset  . Diabetes Mother   . Hypertension Mother   . CVA Mother   . Kidney disease Mother   . Heart failure Mother   . Arthritis Mother   . Cancer Father        larengeal  . Heart disease Father   . Congestive Heart Failure Father   . Bradycardia Father   . Hypotension Father   . Parkinson's disease Sister   . Osteoporosis Sister   . Stroke Maternal Grandmother   . Stroke Maternal  Grandfather   . Colon cancer Neg Hx     Social History Social History  Substance Use Topics  . Smoking status: Former Smoker    Packs/day: 0.25    Years: 0.50    Quit date: 12/27/1958  . Smokeless tobacco: Never Used     Comment: only smoked for a few months  . Alcohol use No    Allergies  Allergen Reactions  . Penicillins Anaphylaxis    Has patient had a PCN reaction causing immediate rash, facial/tongue/throat swelling, SOB or lightheadedness with hypotension: Yes Has patient had a PCN reaction causing severe rash involving mucus membranes or skin necrosis: Yes Has patient had a PCN reaction that required hospitalization Yes Has patient had a PCN reaction occurring within the last 10 years: No If all of the above answers are "NO", then may proceed with Cephalosporin use.yes  . Clarithromycin   . Flagyl [Metronidazole]     Current Outpatient Prescriptions  Medication Sig Dispense Refill  . acetaminophen (TYLENOL) 500 MG tablet Take 500 mg by mouth every 6 (six) hours as needed.    Marland Kitchen amLODipine (NORVASC) 5 MG tablet TAKE 1 TABLET (5 MG TOTAL) BY MOUTH DAILY. 90 tablet 1  . Calcium-Vitamin D-Vitamin K 500-100-40 MG-UNT-MCG CHEW Chew 1 tablet by mouth 2 (two) times daily.     . carbidopa-levodopa (SINEMET IR) 25-100 MG tablet Take 1 tablet by mouth 3 (three) times daily. 270 tablet 1  . docusate sodium (COLACE) 100 MG capsule Take 100 mg by mouth 3 (three) times daily as needed (constipation).     . Magnesium Oxide (PHILLIPS) 500 MG (LAX) TABS Take 1 tablet (500 mg total) by mouth daily as needed.  0  . meloxicam (MOBIC) 15 MG tablet TAKE 1 TABLET BY MOUTH DAILY 90 tablet 1  . Misc Natural Products (COSAMIN ASU ADVANCED FORMULA) CAPS Take 1 capsule by mouth 3 (three) times daily with meals.     Marland Kitchen MYRBETRIQ 50 MG TB24 tablet TAKE 1 TABLET (50 MG TOTAL) BY MOUTH DAILY. 90 tablet 1  . nitroGLYCERIN (NITROSTAT) 0.4 MG SL tablet Place 0.4 mg under the tongue every 5 (five) minutes as  needed. 1 tablet under the tongue at onset of chest pain: you may repeat every 5 minutes for up to 3 doses    . omeprazole (PRILOSEC) 40 MG capsule TAKE ONE CAPSULE BY MOUTH TWICE A DAY AS NEEDED 30 capsule 5  . Probiotic Product (HEALTHY COLON) CAPS Take 1 capsule by mouth daily.     . silver sulfADIAZINE (SILVADENE) 1 % cream APPLY CREAM TO SKIN ONCE A DAY  2  . traMADol (ULTRAM) 50 MG tablet      No current facility-administered medications  for this visit.      Physical Exam  Blood pressure 126/62, pulse 75, temperature (!) 96.8 F (36 C).  Constitutional: overall normal hygiene, normal nutrition, well developed, normal grooming, normal body habitus. Assistive device:walker  Musculoskeletal: gait and station Limp left, muscle tone and strength are normal, no tremors or atrophy is present.  .  Neurological: coordination overall normal.  Deep tendon reflex/nerve stretch intact.  Sensation normal.  Cranial nerves II-XII intact.   Skin:   Normal overall no scars, lesions, ulcers or rashes. No psoriasis.  Psychiatric: Alert and oriented x 3.  Recent memory intact, remote memory unclear.  Normal mood and affect. Well groomed.  Good eye contact.  Cardiovascular: overall no swelling, no varicosities, no edema bilaterally, normal temperatures of the legs and arms, no clubbing, cyanosis and good capillary refill.  Lymphatic: palpation is normal.  Left shoulder is tender and has some ecchymosis.  NV intact. ROM is limited secondary to pain.    X-rays were done of the left shoulder, reported separately.  No fracture is seen.  The patient has been educated about the nature of the problem(s) and counseled on treatment options.  The patient appeared to understand what I have discussed and is in agreement with it.  Encounter Diagnosis  Name Primary?  . Acute pain of left shoulder Yes    PLAN Call if any problems.  Precautions discussed.  Continue current medications.   Return to clinic  2 weeks   Electronically Signed Sanjuana Kava, MD 5/24/20182:41 PM

## 2017-06-01 ENCOUNTER — Ambulatory Visit (HOSPITAL_COMMUNITY)
Admission: RE | Admit: 2017-06-01 | Discharge: 2017-06-01 | Disposition: A | Discharge status: Home / Self Care | Payer: Medicare HMO | Source: Ambulatory Visit | Attending: Family Medicine | Admitting: Family Medicine

## 2017-06-01 DIAGNOSIS — E042 Nontoxic multinodular goiter: Secondary | ICD-10-CM | POA: Diagnosis not present

## 2017-06-02 ENCOUNTER — Ambulatory Visit: Payer: Medicare HMO | Admitting: Orthopaedic Surgery

## 2017-06-02 ENCOUNTER — Encounter: Payer: Self-pay | Admitting: Family Medicine

## 2017-06-04 DIAGNOSIS — M5030 Other cervical disc degeneration, unspecified cervical region: Secondary | ICD-10-CM | POA: Diagnosis not present

## 2017-06-04 DIAGNOSIS — I739 Peripheral vascular disease, unspecified: Secondary | ICD-10-CM | POA: Diagnosis not present

## 2017-06-04 DIAGNOSIS — M5136 Other intervertebral disc degeneration, lumbar region: Secondary | ICD-10-CM | POA: Diagnosis not present

## 2017-06-04 DIAGNOSIS — D649 Anemia, unspecified: Secondary | ICD-10-CM | POA: Diagnosis not present

## 2017-06-04 DIAGNOSIS — R296 Repeated falls: Secondary | ICD-10-CM | POA: Diagnosis not present

## 2017-06-04 DIAGNOSIS — M810 Age-related osteoporosis without current pathological fracture: Secondary | ICD-10-CM | POA: Diagnosis not present

## 2017-06-04 DIAGNOSIS — I2510 Atherosclerotic heart disease of native coronary artery without angina pectoris: Secondary | ICD-10-CM | POA: Diagnosis not present

## 2017-06-04 DIAGNOSIS — G20 Parkinson's disease: Secondary | ICD-10-CM | POA: Diagnosis not present

## 2017-06-04 DIAGNOSIS — I10 Essential (primary) hypertension: Secondary | ICD-10-CM | POA: Diagnosis not present

## 2017-06-04 DIAGNOSIS — M150 Primary generalized (osteo)arthritis: Secondary | ICD-10-CM | POA: Diagnosis not present

## 2017-06-09 DIAGNOSIS — M5030 Other cervical disc degeneration, unspecified cervical region: Secondary | ICD-10-CM | POA: Diagnosis not present

## 2017-06-09 DIAGNOSIS — M810 Age-related osteoporosis without current pathological fracture: Secondary | ICD-10-CM | POA: Diagnosis not present

## 2017-06-09 DIAGNOSIS — M5136 Other intervertebral disc degeneration, lumbar region: Secondary | ICD-10-CM | POA: Diagnosis not present

## 2017-06-09 DIAGNOSIS — R296 Repeated falls: Secondary | ICD-10-CM | POA: Diagnosis not present

## 2017-06-09 DIAGNOSIS — M150 Primary generalized (osteo)arthritis: Secondary | ICD-10-CM | POA: Diagnosis not present

## 2017-06-09 DIAGNOSIS — I739 Peripheral vascular disease, unspecified: Secondary | ICD-10-CM | POA: Diagnosis not present

## 2017-06-09 DIAGNOSIS — I10 Essential (primary) hypertension: Secondary | ICD-10-CM | POA: Diagnosis not present

## 2017-06-09 DIAGNOSIS — D649 Anemia, unspecified: Secondary | ICD-10-CM | POA: Diagnosis not present

## 2017-06-09 DIAGNOSIS — I2510 Atherosclerotic heart disease of native coronary artery without angina pectoris: Secondary | ICD-10-CM | POA: Diagnosis not present

## 2017-06-09 DIAGNOSIS — G20 Parkinson's disease: Secondary | ICD-10-CM | POA: Diagnosis not present

## 2017-06-13 DIAGNOSIS — D649 Anemia, unspecified: Secondary | ICD-10-CM | POA: Diagnosis not present

## 2017-06-13 DIAGNOSIS — M5136 Other intervertebral disc degeneration, lumbar region: Secondary | ICD-10-CM | POA: Diagnosis not present

## 2017-06-13 DIAGNOSIS — G20 Parkinson's disease: Secondary | ICD-10-CM | POA: Diagnosis not present

## 2017-06-13 DIAGNOSIS — I10 Essential (primary) hypertension: Secondary | ICD-10-CM | POA: Diagnosis not present

## 2017-06-13 DIAGNOSIS — I2510 Atherosclerotic heart disease of native coronary artery without angina pectoris: Secondary | ICD-10-CM | POA: Diagnosis not present

## 2017-06-13 DIAGNOSIS — M810 Age-related osteoporosis without current pathological fracture: Secondary | ICD-10-CM | POA: Diagnosis not present

## 2017-06-13 DIAGNOSIS — M150 Primary generalized (osteo)arthritis: Secondary | ICD-10-CM | POA: Diagnosis not present

## 2017-06-13 DIAGNOSIS — R296 Repeated falls: Secondary | ICD-10-CM | POA: Diagnosis not present

## 2017-06-13 DIAGNOSIS — I739 Peripheral vascular disease, unspecified: Secondary | ICD-10-CM | POA: Diagnosis not present

## 2017-06-13 DIAGNOSIS — M5030 Other cervical disc degeneration, unspecified cervical region: Secondary | ICD-10-CM | POA: Diagnosis not present

## 2017-06-15 DIAGNOSIS — I10 Essential (primary) hypertension: Secondary | ICD-10-CM | POA: Diagnosis not present

## 2017-06-15 DIAGNOSIS — M150 Primary generalized (osteo)arthritis: Secondary | ICD-10-CM | POA: Diagnosis not present

## 2017-06-15 DIAGNOSIS — D649 Anemia, unspecified: Secondary | ICD-10-CM | POA: Diagnosis not present

## 2017-06-15 DIAGNOSIS — M5136 Other intervertebral disc degeneration, lumbar region: Secondary | ICD-10-CM | POA: Diagnosis not present

## 2017-06-15 DIAGNOSIS — M5030 Other cervical disc degeneration, unspecified cervical region: Secondary | ICD-10-CM | POA: Diagnosis not present

## 2017-06-15 DIAGNOSIS — I2510 Atherosclerotic heart disease of native coronary artery without angina pectoris: Secondary | ICD-10-CM | POA: Diagnosis not present

## 2017-06-15 DIAGNOSIS — M810 Age-related osteoporosis without current pathological fracture: Secondary | ICD-10-CM | POA: Diagnosis not present

## 2017-06-15 DIAGNOSIS — G20 Parkinson's disease: Secondary | ICD-10-CM | POA: Diagnosis not present

## 2017-06-15 DIAGNOSIS — R296 Repeated falls: Secondary | ICD-10-CM | POA: Diagnosis not present

## 2017-06-15 DIAGNOSIS — I739 Peripheral vascular disease, unspecified: Secondary | ICD-10-CM | POA: Diagnosis not present

## 2017-06-17 DIAGNOSIS — I10 Essential (primary) hypertension: Secondary | ICD-10-CM | POA: Diagnosis not present

## 2017-06-17 DIAGNOSIS — R296 Repeated falls: Secondary | ICD-10-CM | POA: Diagnosis not present

## 2017-06-17 DIAGNOSIS — M150 Primary generalized (osteo)arthritis: Secondary | ICD-10-CM | POA: Diagnosis not present

## 2017-06-17 DIAGNOSIS — D649 Anemia, unspecified: Secondary | ICD-10-CM | POA: Diagnosis not present

## 2017-06-17 DIAGNOSIS — M5030 Other cervical disc degeneration, unspecified cervical region: Secondary | ICD-10-CM | POA: Diagnosis not present

## 2017-06-17 DIAGNOSIS — G20 Parkinson's disease: Secondary | ICD-10-CM | POA: Diagnosis not present

## 2017-06-17 DIAGNOSIS — I739 Peripheral vascular disease, unspecified: Secondary | ICD-10-CM | POA: Diagnosis not present

## 2017-06-17 DIAGNOSIS — I2510 Atherosclerotic heart disease of native coronary artery without angina pectoris: Secondary | ICD-10-CM | POA: Diagnosis not present

## 2017-06-17 DIAGNOSIS — M5136 Other intervertebral disc degeneration, lumbar region: Secondary | ICD-10-CM | POA: Diagnosis not present

## 2017-06-17 DIAGNOSIS — M810 Age-related osteoporosis without current pathological fracture: Secondary | ICD-10-CM | POA: Diagnosis not present

## 2017-06-18 ENCOUNTER — Other Ambulatory Visit: Payer: Self-pay | Admitting: Family Medicine

## 2017-06-20 DIAGNOSIS — G20 Parkinson's disease: Secondary | ICD-10-CM | POA: Diagnosis not present

## 2017-06-20 DIAGNOSIS — M810 Age-related osteoporosis without current pathological fracture: Secondary | ICD-10-CM | POA: Diagnosis not present

## 2017-06-20 DIAGNOSIS — M150 Primary generalized (osteo)arthritis: Secondary | ICD-10-CM | POA: Diagnosis not present

## 2017-06-20 DIAGNOSIS — I739 Peripheral vascular disease, unspecified: Secondary | ICD-10-CM | POA: Diagnosis not present

## 2017-06-20 DIAGNOSIS — M5030 Other cervical disc degeneration, unspecified cervical region: Secondary | ICD-10-CM | POA: Diagnosis not present

## 2017-06-20 DIAGNOSIS — I10 Essential (primary) hypertension: Secondary | ICD-10-CM | POA: Diagnosis not present

## 2017-06-20 DIAGNOSIS — R296 Repeated falls: Secondary | ICD-10-CM | POA: Diagnosis not present

## 2017-06-20 DIAGNOSIS — I2510 Atherosclerotic heart disease of native coronary artery without angina pectoris: Secondary | ICD-10-CM | POA: Diagnosis not present

## 2017-06-20 DIAGNOSIS — M5136 Other intervertebral disc degeneration, lumbar region: Secondary | ICD-10-CM | POA: Diagnosis not present

## 2017-06-20 DIAGNOSIS — D649 Anemia, unspecified: Secondary | ICD-10-CM | POA: Diagnosis not present

## 2017-06-22 NOTE — Telephone Encounter (Signed)
Seen [phone] 

## 2017-06-23 DIAGNOSIS — D649 Anemia, unspecified: Secondary | ICD-10-CM | POA: Diagnosis not present

## 2017-06-23 DIAGNOSIS — I739 Peripheral vascular disease, unspecified: Secondary | ICD-10-CM | POA: Diagnosis not present

## 2017-06-23 DIAGNOSIS — I10 Essential (primary) hypertension: Secondary | ICD-10-CM | POA: Diagnosis not present

## 2017-06-23 DIAGNOSIS — M5136 Other intervertebral disc degeneration, lumbar region: Secondary | ICD-10-CM | POA: Diagnosis not present

## 2017-06-23 DIAGNOSIS — M150 Primary generalized (osteo)arthritis: Secondary | ICD-10-CM | POA: Diagnosis not present

## 2017-06-23 DIAGNOSIS — M810 Age-related osteoporosis without current pathological fracture: Secondary | ICD-10-CM | POA: Diagnosis not present

## 2017-06-23 DIAGNOSIS — R296 Repeated falls: Secondary | ICD-10-CM | POA: Diagnosis not present

## 2017-06-23 DIAGNOSIS — G20 Parkinson's disease: Secondary | ICD-10-CM | POA: Diagnosis not present

## 2017-06-23 DIAGNOSIS — M5030 Other cervical disc degeneration, unspecified cervical region: Secondary | ICD-10-CM | POA: Diagnosis not present

## 2017-06-23 DIAGNOSIS — I2510 Atherosclerotic heart disease of native coronary artery without angina pectoris: Secondary | ICD-10-CM | POA: Diagnosis not present

## 2017-06-26 IMAGING — DX DG KNEE AP/LAT W/ SUNRISE*L*
3 series · 3 of 3 positions shown · non-contrast
Comparison: None.

CLINICAL DATA: Left knee pain without known injury.

EXAM:
LEFT KNEE 3 VIEWS

[knee ap]
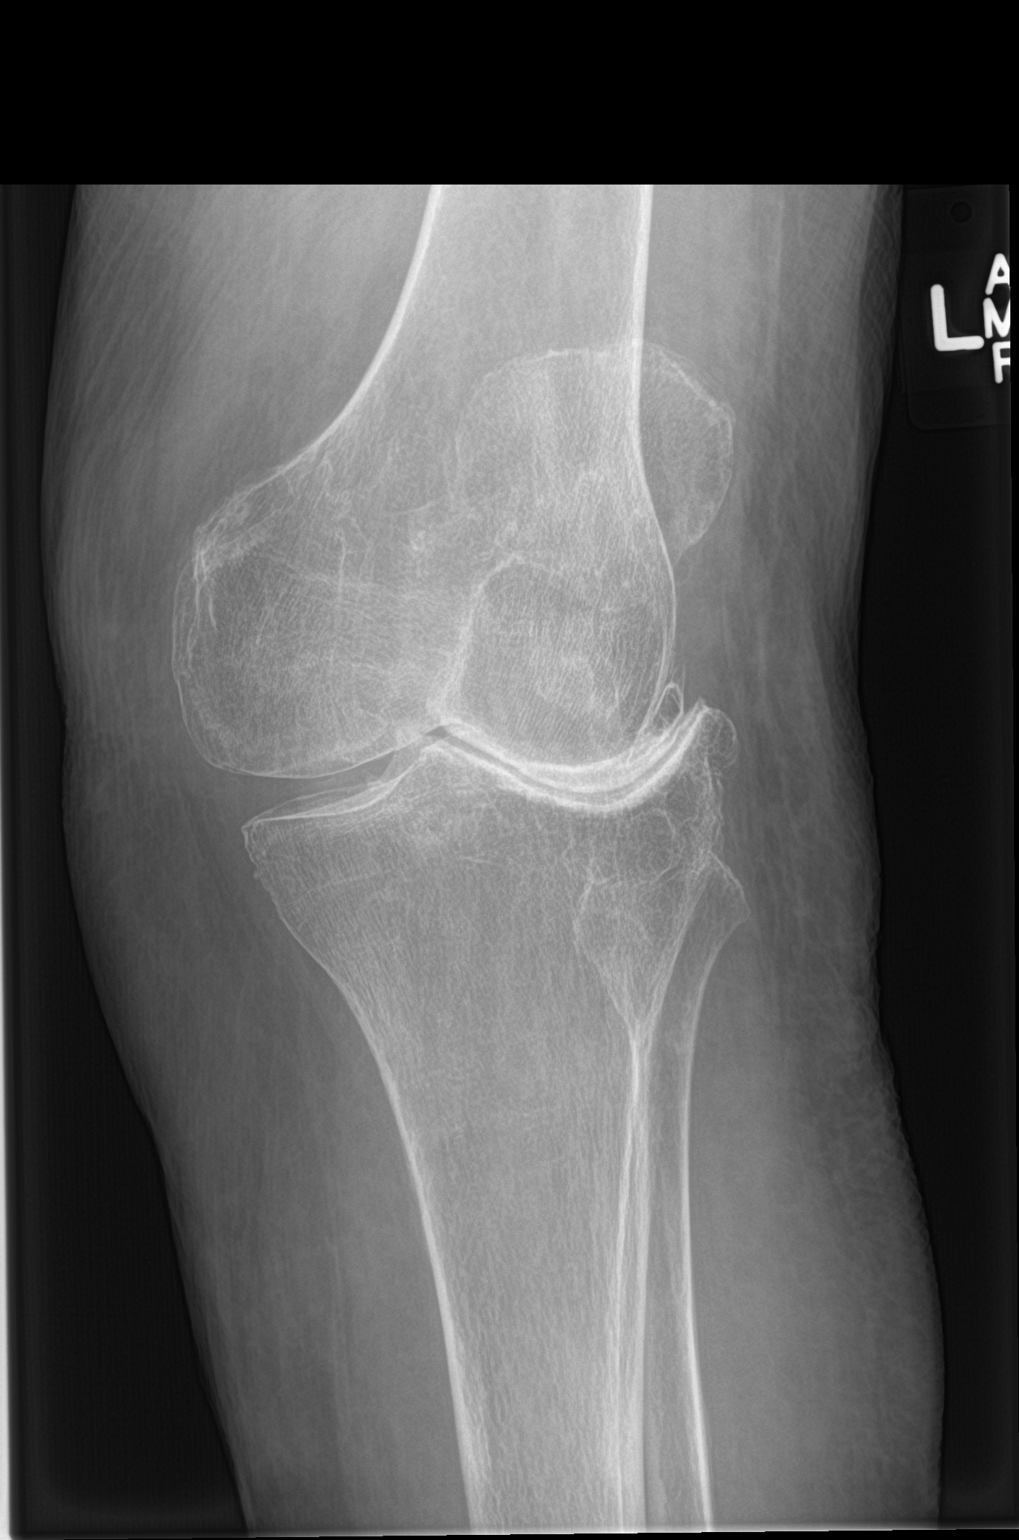

[knee lat]
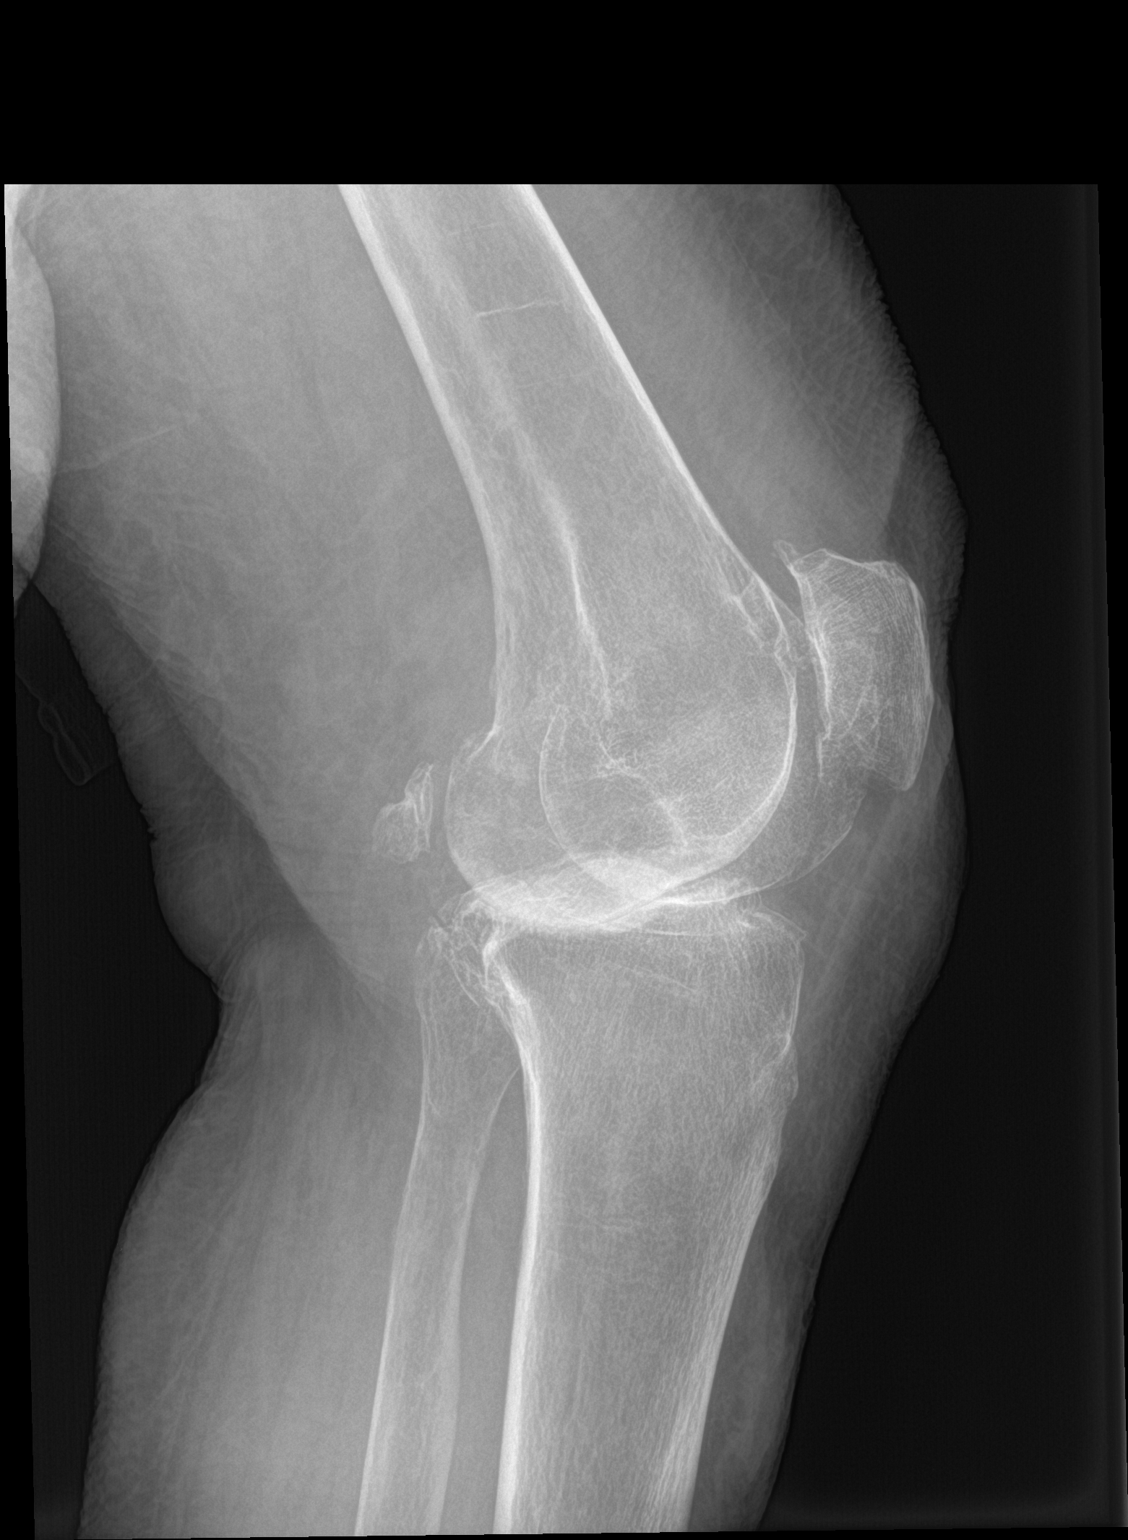

[knee sunrise]
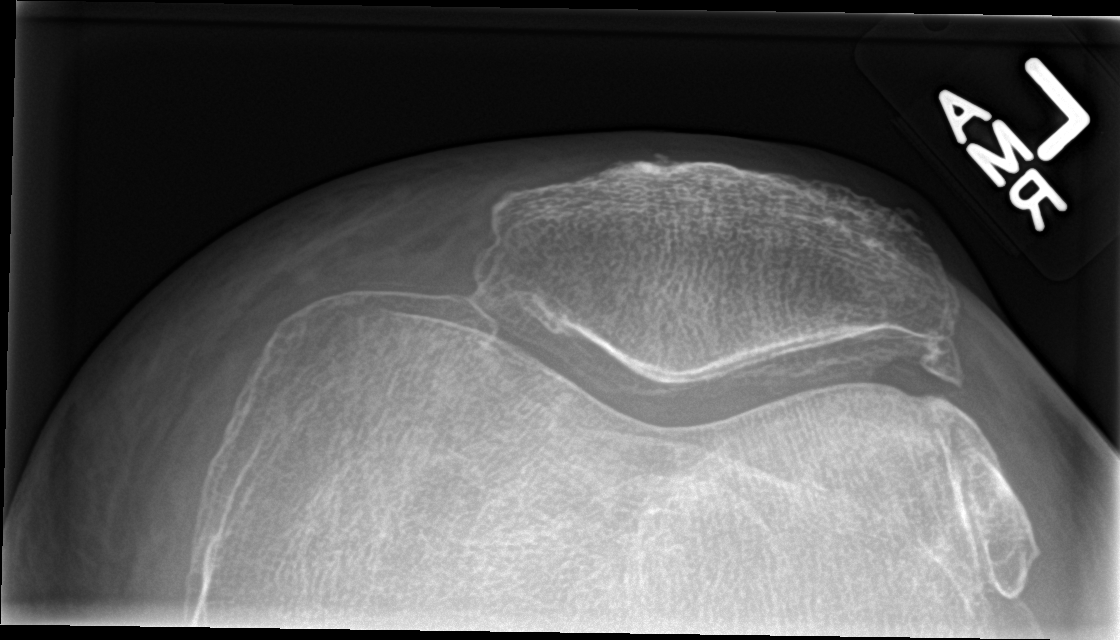

[3 of 3 positions shown; findings below may reference images not displayed]

FINDINGS: There is no evidence of fracture, dislocation, or joint effusion.
Severe narrowing of lateral joint space is noted. Soft tissues are
unremarkable.
IMPRESSION: Severe degenerative joint disease is noted laterally. No acute
abnormality seen in the left knee.

## 2017-06-26 IMAGING — DX DG LUMBAR SPINE 2-3V
3 series · 3 of 3 positions shown · non-contrast
Comparison: None.

CLINICAL DATA: Low back pain with left-sided sciatica. No known
injury.

EXAM:
LUMBAR SPINE - 2-3 VIEW

[l-spine ap]
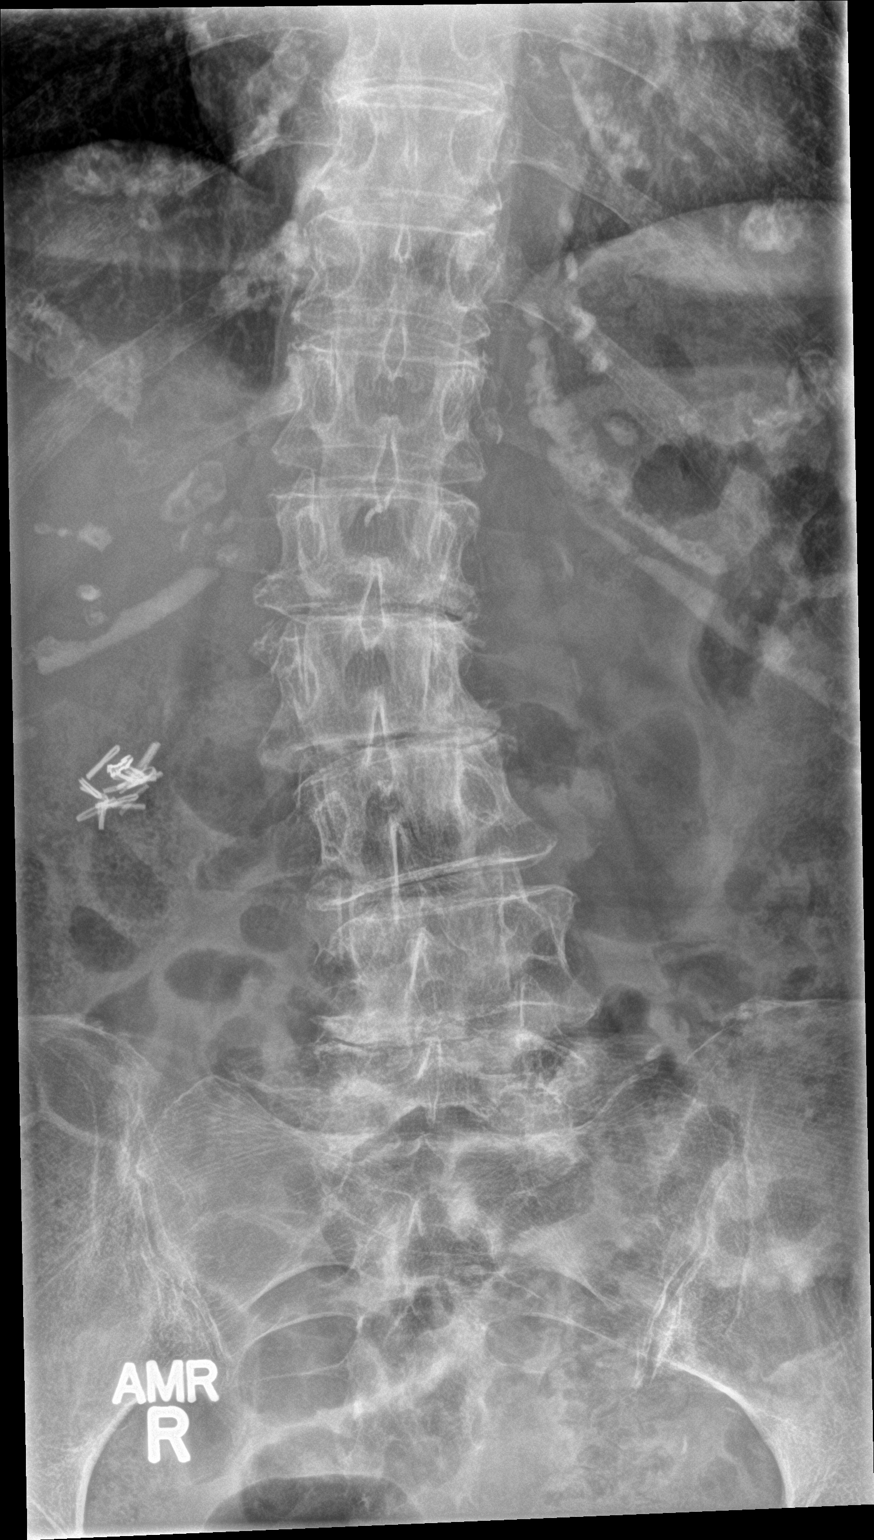

[l-spine lat]
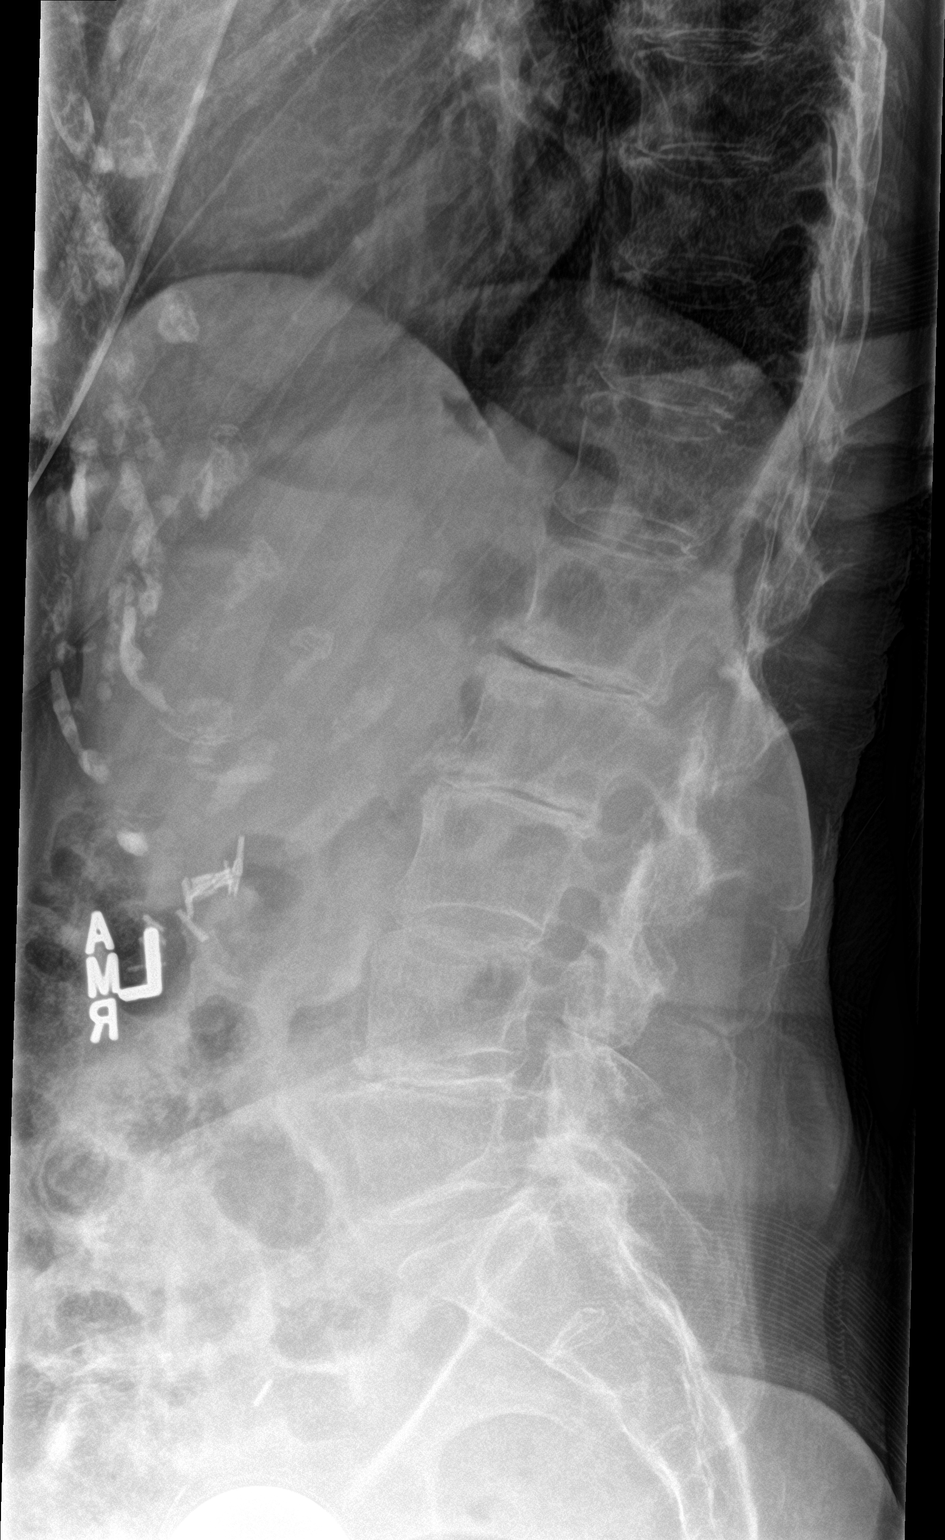

[l-spine spot]
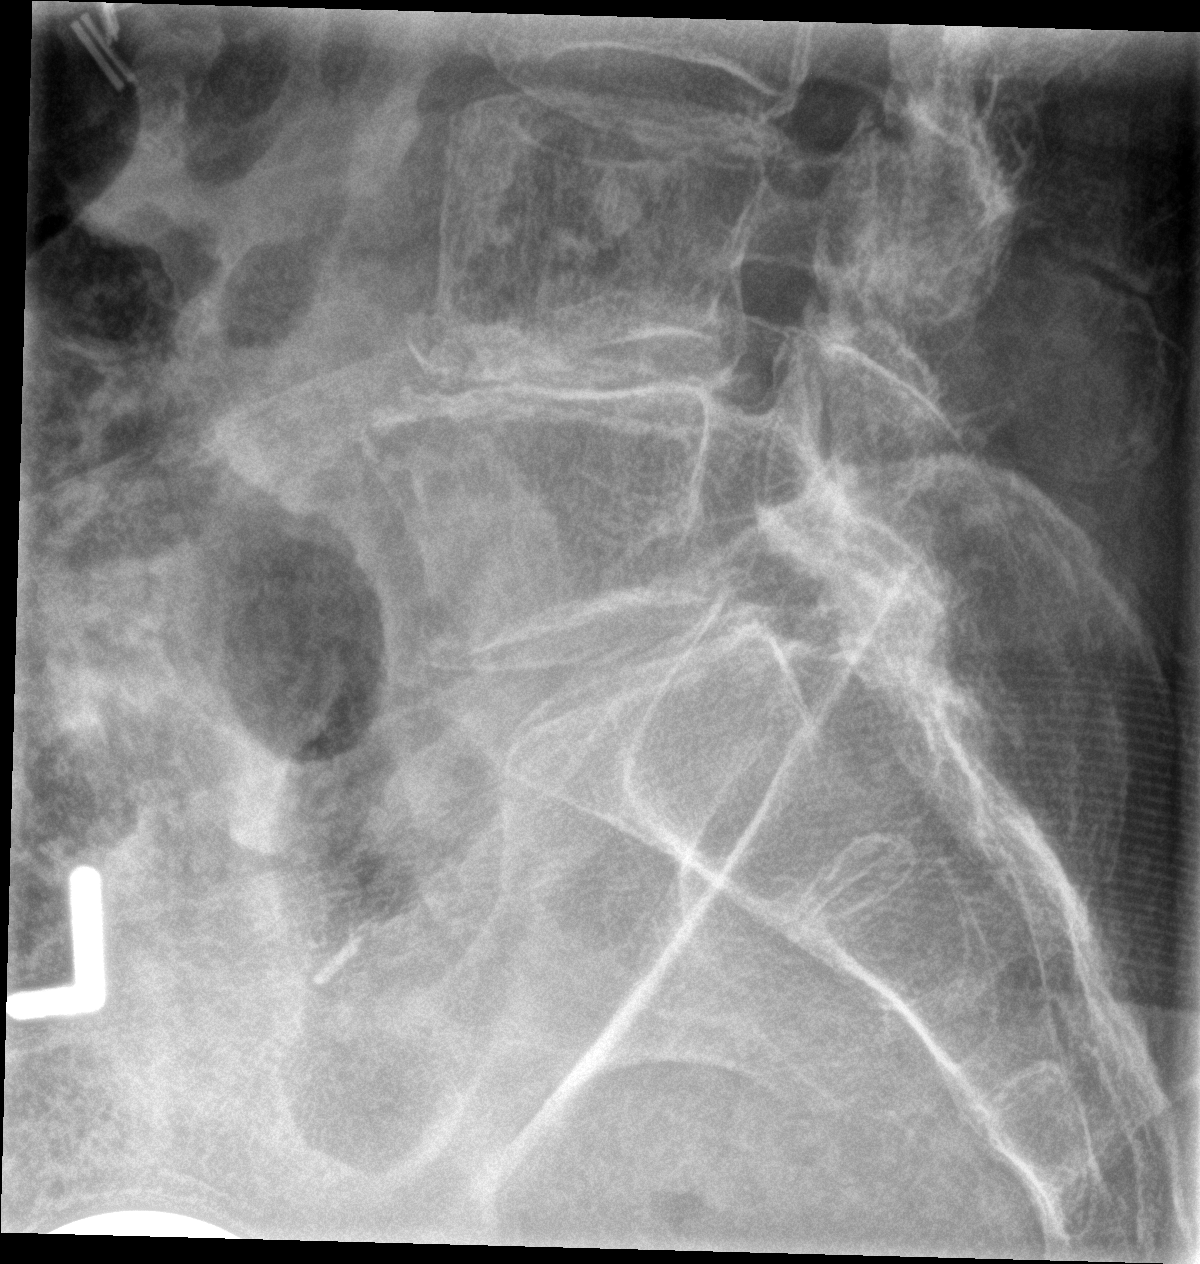

[3 of 3 positions shown; findings below may reference images not displayed]

FINDINGS: AP and lateral views of the lumbar spine are provided. There is
dextroscoliosis of the thoracolumbar spine, measuring approximately
21 degrees. Moderate degenerative change noted throughout the lumbar
spine with associated disc space narrowings and osseous spurring.
Additional degenerative arthropathy noted amongst the posterior
facets of L2 through S1.

There is slight anterolisthesis of L5 likely related to the
underlying degenerative changes. There is no acute-appearing
subluxation or dislocation.

There is diffuse osteopenia which limits characterization of osseous
detail. However, there is no fracture line or displaced fracture
fragment. No acute-appearing cortical irregularity or osseous
lesion. Upper sacrum appears intact and well aligned. Paravertebral
soft tissues are unremarkable. Cholecystectomy clips incidentally
noted within the right upper abdomen.
IMPRESSION: 1. Degenerative changes throughout the thoracolumbar spine, moderate
in degree. Given the history of left-sided sciatica, suspect
associated nerve root impingement at 1 or more levels. This could be
confirmed with lumbar spine MRI.
2. Dextroscoliosis of the thoracolumbar spine, approximately 21
degrees.
3. No acute findings.

## 2017-06-27 DIAGNOSIS — I10 Essential (primary) hypertension: Secondary | ICD-10-CM | POA: Diagnosis not present

## 2017-06-27 DIAGNOSIS — D649 Anemia, unspecified: Secondary | ICD-10-CM | POA: Diagnosis not present

## 2017-06-27 DIAGNOSIS — I2510 Atherosclerotic heart disease of native coronary artery without angina pectoris: Secondary | ICD-10-CM | POA: Diagnosis not present

## 2017-06-27 DIAGNOSIS — R296 Repeated falls: Secondary | ICD-10-CM | POA: Diagnosis not present

## 2017-06-27 DIAGNOSIS — G20 Parkinson's disease: Secondary | ICD-10-CM | POA: Diagnosis not present

## 2017-06-27 DIAGNOSIS — M5030 Other cervical disc degeneration, unspecified cervical region: Secondary | ICD-10-CM | POA: Diagnosis not present

## 2017-06-27 DIAGNOSIS — M5136 Other intervertebral disc degeneration, lumbar region: Secondary | ICD-10-CM | POA: Diagnosis not present

## 2017-06-27 DIAGNOSIS — M150 Primary generalized (osteo)arthritis: Secondary | ICD-10-CM | POA: Diagnosis not present

## 2017-06-27 DIAGNOSIS — M810 Age-related osteoporosis without current pathological fracture: Secondary | ICD-10-CM | POA: Diagnosis not present

## 2017-06-27 DIAGNOSIS — I739 Peripheral vascular disease, unspecified: Secondary | ICD-10-CM | POA: Diagnosis not present

## 2017-06-28 ENCOUNTER — Encounter (INDEPENDENT_AMBULATORY_CARE_PROVIDER_SITE_OTHER): Payer: Self-pay | Admitting: Internal Medicine

## 2017-06-28 ENCOUNTER — Ambulatory Visit (INDEPENDENT_AMBULATORY_CARE_PROVIDER_SITE_OTHER): Payer: Medicare HMO | Admitting: Internal Medicine

## 2017-06-28 VITALS — BP 120/60 | HR 60 | Temp 97.8°F | Ht 66.0 in | Wt 141.4 lb

## 2017-06-28 DIAGNOSIS — K219 Gastro-esophageal reflux disease without esophagitis: Secondary | ICD-10-CM

## 2017-06-28 DIAGNOSIS — K2270 Barrett's esophagus without dysplasia: Secondary | ICD-10-CM

## 2017-06-28 DIAGNOSIS — Z86010 Personal history of colonic polyps: Secondary | ICD-10-CM | POA: Diagnosis not present

## 2017-06-28 NOTE — Patient Instructions (Signed)
OV in 1 year. Continue the Omeprazole.  

## 2017-06-28 NOTE — Progress Notes (Signed)
Subjective:    Patient ID: Kerry Flynn, female    DOB: 10-12-1927, 83 y.o.   MRN: [id number]  HPI Here today for f/u. Last seen in 2016. Hx of colon polyps. She has flatus. Sometimes she has constipation and sometimes she has loose stools. Her appetite is good. No weight loss. She has a BM daily and sometimes she has to take a laxative. She occasionally has some incontinence. Symptoms for several years.  She has acid reflux occasionally. Take Omeprazole daily for acid reflux. Hx of Barrett's esophagus in 2007. IN 2013 no Barrett';s mucosa noted.      Underwent a colonoscopy in 2016 for hx of colonic adenomas. Prep satisfactory. Small polyp at ascending colon was coagulated with snare tip. 15 x 7 mm polyp snared piecemeal from ascending colon. Polypectomy complete. Pancolonic diverticulosis. Small hemorrhoids below the dentate line. Polyp tubular adenoma.  Hx of GERD/Barrett's esophagus. Last EGD 04/19/2012 Single ulcer at GE junction but no Barrett's mucosa noted. Biopsy taken from GEJ  Small sliding-type hernia. Diffuse gastritis possibly secondary to NSAID use. These note she has been treated for H. pylori  Gastritis.  Biopsy results reviewed with the patient. Esophageal biopsy shows changes secondary to reflux esophagitis but no Barrett's epithelium identified.  EGD in 2007 revealed Barrett's esophagus.  Review of Systems Past Medical History:  Diagnosis Date  . Abnormality of gait 11/17/2015  . Anemia   . Barrett's esophagus    Dr. Laural Golden, Dr. Moshe Cipro  . Benign recurrent vertigo   . Chronic constipation   . Chronic knee pain   . Colon polyps   . DDD (degenerative disc disease), cervical   . DDD (degenerative disc disease), lumbar   . Dermatitis    recurrent  . Diverticulosis   . 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 Fatigue    chronic  . GERD (gastroesophageal reflux disease)   . Head trauma    status post fall; upper and lower extremities   . Hemorrhoid     . Hemorrhoids   . Hip fracture, left ([place])    s/p left arthroplasty 2/16  . Hypertension   . Infertility, female   . Intermittent vertigo   . Osteoarthritis    of the neck  . Osteoporosis    knees  . Parkinson disease ([place]) 11/17/2015  . Peripheral edema    chronic  . Stable angina ([facility])   . Varicose veins   . Vitamin D deficiency     Past Surgical History:  Procedure Laterality Date  . CARDIAC CATHETERIZATION  09/02/2005   mild-mod calcification in mid LAD (20-30% luminal obstruction), otherwise normal coronaries, patent LE arteries (Dr. Jackie Plum)  . CAROTID DOPPLER  02/2008   R & L ICAs 0-49% diameter reduction  . CATARACT EXTRACTION W/ INTRAOCULAR LENS IMPLANT Left 01/06/2012   left, [place]  . CHOLECYSTECTOMY    . CHOLECYSTECTOMY, LAPAROSCOPIC  06/17/2006   with stones  . COLONOSCOPY  04/19/2012   Procedure: COLONOSCOPY;  Surgeon: Rogene Houston, MD;  [facility];  Service: Endoscopy;  Laterality: N/A;  1200  . COLONOSCOPY N/A 09/25/2015   Procedure: COLONOSCOPY;  Surgeon: Rogene Houston, MD;  [facility];  Service: Endoscopy;  Laterality: N/A;  100  . COLONOSCOPY W/ POLYPECTOMY  05/29/1999, 01/12/2012  . dental implant   09/2014  . ESOPHAGOGASTRODUODENOSCOPY  01/12/2012  . EYE SURGERY  01/24/2012   bilateral cataract extraction  . HIP ARTHROPLASTY Left 01/31/2015   Procedure: ARTHROPLASTY BIPOLAR HIP;  Surgeon: Carole Civil, MD;  Location: [facility];  Service: Orthopedics;  Laterality: Left;  . KNEE ARTHROSCOPY Right 1999  . KNEE ARTHROSCOPY Left 1996  . NM MYOCAR PERF WALL MOTION  02/2012   lexiscan - small, fized apical lateral bowel attenuation artifact; no reversible ischemia; EF 77%; non-diagnostic for ischemia; low risk  . PALATE SURGERY  1988  . ROTATOR CUFF REPAIR  05/14/2010  . TONSILLECTOMY    . TONSILLECTOMY AND ADENOIDECTOMY  1934  . TRANSTHORACIC ECHOCARDIOGRAM  01/2012   EF=>55%; mild mitral annular calcification & mild MR; trace  TR; trace pulm valve regurg  . UPPER GI ENDOSCOPY  12/12/2006   with biopsy, with colonoscopy - dx: Barrett's disease    Allergies  Allergen Reactions  . Penicillins Anaphylaxis    Has patient had a PCN reaction causing immediate rash, facial/tongue/throat swelling, SOB or lightheadedness with hypotension: Yes Has patient had a PCN reaction causing severe rash involving mucus membranes or skin necrosis: Yes Has patient had a PCN reaction that required hospitalization Yes Has patient had a PCN reaction occurring within the last 10 years: No If all of the above answers are "NO", then may proceed with Cephalosporin use.yes  . Clarithromycin   . Flagyl [Metronidazole]     Current Outpatient Prescriptions on File Prior to Visit  Medication Sig Dispense Refill  . acetaminophen (TYLENOL) 500 MG tablet Take 500 mg by mouth every 6 (six) hours as needed.    Marland Kitchen amLODipine (NORVASC) 5 MG tablet TAKE ONE TABLET BY MOUTH ONCE DAILY 90 tablet 1  . Calcium-Vitamin D-Vitamin K 500-100-40 MG-UNT-MCG CHEW Chew 1 tablet by mouth 2 (two) times daily.     . carbidopa-levodopa (SINEMET IR) 25-100 MG tablet Take 1 tablet by mouth 3 (three) times daily. 270 tablet 1  . docusate sodium (COLACE) 100 MG capsule Take 100 mg by mouth 3 (three) times daily as needed (constipation).     . Magnesium Oxide (PHILLIPS) 500 MG (LAX) TABS Take 1 tablet (500 mg total) by mouth daily as needed.  0  . meloxicam (MOBIC) 15 MG tablet TAKE 1 TABLET BY MOUTH DAILY 90 tablet 1  . Misc Natural Products (COSAMIN ASU ADVANCED FORMULA) CAPS Take 1 capsule by mouth 3 (three) times daily with meals.     Marland Kitchen MYRBETRIQ 50 MG TB24 tablet TAKE 1 TABLET (50 MG TOTAL) BY MOUTH DAILY. 90 tablet 1  . nitroGLYCERIN (NITROSTAT) 0.4 MG SL tablet Place 0.4 mg under the tongue every 5 (five) minutes as needed. 1 tablet under the tongue at onset of chest pain: you may repeat every 5 minutes for up to 3 doses    . omeprazole (PRILOSEC) 40 MG capsule TAKE  ONE CAPSULE BY MOUTH TWICE A DAY AS NEEDED 30 capsule 5  . Probiotic Product (HEALTHY COLON) CAPS Take 1 capsule by mouth daily.     . traMADol (ULTRAM) 50 MG tablet     . silver sulfADIAZINE (SILVADENE) 1 % cream APPLY CREAM TO SKIN ONCE A DAY  2   No current facility-administered medications on file prior to visit.         Objective:   Physical Exam Blood pressure 120/60, pulse 60, temperature 97.8 F (36.6 C), height 5\' 6"  (1.676 m), weight 141 lb 6.4 oz (64.1 kg).  Alert and oriented. Skin warm and dry. Oral mucosa is moist.   . Sclera anicteric, conjunctivae is pink. Thyroid not enlarged. No cervical lymphadenopathy. Lungs clear. Heart regular rate and rhythm.  Abdomen  is soft. Bowel sounds are positive. No hepatomegaly. No abdominal masses felt. No tenderness.  No edema to lower extremities.  (Examined from W/C)         Assessment & Plan:  Barrett's esophagus.  No Barrett's on EGD in 2013. Will discuss with Dr. Laural Golden on timiing of EGD and colonoscopy.  Hx of colon polyps. Last Colonoscopy was in 2016. Will  .

## 2017-06-30 DIAGNOSIS — G20 Parkinson's disease: Secondary | ICD-10-CM | POA: Diagnosis not present

## 2017-06-30 DIAGNOSIS — I10 Essential (primary) hypertension: Secondary | ICD-10-CM | POA: Diagnosis not present

## 2017-06-30 DIAGNOSIS — I739 Peripheral vascular disease, unspecified: Secondary | ICD-10-CM | POA: Diagnosis not present

## 2017-06-30 DIAGNOSIS — M810 Age-related osteoporosis without current pathological fracture: Secondary | ICD-10-CM | POA: Diagnosis not present

## 2017-06-30 DIAGNOSIS — D649 Anemia, unspecified: Secondary | ICD-10-CM | POA: Diagnosis not present

## 2017-06-30 DIAGNOSIS — M150 Primary generalized (osteo)arthritis: Secondary | ICD-10-CM | POA: Diagnosis not present

## 2017-06-30 DIAGNOSIS — M5030 Other cervical disc degeneration, unspecified cervical region: Secondary | ICD-10-CM | POA: Diagnosis not present

## 2017-06-30 DIAGNOSIS — M5136 Other intervertebral disc degeneration, lumbar region: Secondary | ICD-10-CM | POA: Diagnosis not present

## 2017-06-30 DIAGNOSIS — I2510 Atherosclerotic heart disease of native coronary artery without angina pectoris: Secondary | ICD-10-CM | POA: Diagnosis not present

## 2017-06-30 DIAGNOSIS — R296 Repeated falls: Secondary | ICD-10-CM | POA: Diagnosis not present

## 2017-07-04 DIAGNOSIS — G20 Parkinson's disease: Secondary | ICD-10-CM | POA: Diagnosis not present

## 2017-07-04 DIAGNOSIS — I739 Peripheral vascular disease, unspecified: Secondary | ICD-10-CM | POA: Diagnosis not present

## 2017-07-04 DIAGNOSIS — I10 Essential (primary) hypertension: Secondary | ICD-10-CM | POA: Diagnosis not present

## 2017-07-04 DIAGNOSIS — M810 Age-related osteoporosis without current pathological fracture: Secondary | ICD-10-CM | POA: Diagnosis not present

## 2017-07-04 DIAGNOSIS — I2510 Atherosclerotic heart disease of native coronary artery without angina pectoris: Secondary | ICD-10-CM | POA: Diagnosis not present

## 2017-07-04 DIAGNOSIS — M5136 Other intervertebral disc degeneration, lumbar region: Secondary | ICD-10-CM | POA: Diagnosis not present

## 2017-07-04 DIAGNOSIS — R296 Repeated falls: Secondary | ICD-10-CM | POA: Diagnosis not present

## 2017-07-04 DIAGNOSIS — M150 Primary generalized (osteo)arthritis: Secondary | ICD-10-CM | POA: Diagnosis not present

## 2017-07-04 DIAGNOSIS — D649 Anemia, unspecified: Secondary | ICD-10-CM | POA: Diagnosis not present

## 2017-07-04 DIAGNOSIS — M5030 Other cervical disc degeneration, unspecified cervical region: Secondary | ICD-10-CM | POA: Diagnosis not present

## 2017-07-06 DIAGNOSIS — M810 Age-related osteoporosis without current pathological fracture: Secondary | ICD-10-CM | POA: Diagnosis not present

## 2017-07-06 DIAGNOSIS — M150 Primary generalized (osteo)arthritis: Secondary | ICD-10-CM | POA: Diagnosis not present

## 2017-07-06 DIAGNOSIS — D649 Anemia, unspecified: Secondary | ICD-10-CM | POA: Diagnosis not present

## 2017-07-06 DIAGNOSIS — M5030 Other cervical disc degeneration, unspecified cervical region: Secondary | ICD-10-CM | POA: Diagnosis not present

## 2017-07-06 DIAGNOSIS — I2510 Atherosclerotic heart disease of native coronary artery without angina pectoris: Secondary | ICD-10-CM | POA: Diagnosis not present

## 2017-07-06 DIAGNOSIS — R296 Repeated falls: Secondary | ICD-10-CM | POA: Diagnosis not present

## 2017-07-06 DIAGNOSIS — I739 Peripheral vascular disease, unspecified: Secondary | ICD-10-CM | POA: Diagnosis not present

## 2017-07-06 DIAGNOSIS — M5136 Other intervertebral disc degeneration, lumbar region: Secondary | ICD-10-CM | POA: Diagnosis not present

## 2017-07-06 DIAGNOSIS — G20 Parkinson's disease: Secondary | ICD-10-CM | POA: Diagnosis not present

## 2017-07-06 DIAGNOSIS — I10 Essential (primary) hypertension: Secondary | ICD-10-CM | POA: Diagnosis not present

## 2017-07-07 ENCOUNTER — Telehealth (INDEPENDENT_AMBULATORY_CARE_PROVIDER_SITE_OTHER): Payer: Self-pay | Admitting: Internal Medicine

## 2017-07-07 NOTE — Telephone Encounter (Signed)
I spoke with Dr Laural Golden concerning next EGD and colonoscopy. Will have OV in one year and continue to monitor.    Hope, OV in 1 year.

## 2017-07-11 DIAGNOSIS — M5030 Other cervical disc degeneration, unspecified cervical region: Secondary | ICD-10-CM | POA: Diagnosis not present

## 2017-07-11 DIAGNOSIS — R296 Repeated falls: Secondary | ICD-10-CM | POA: Diagnosis not present

## 2017-07-11 DIAGNOSIS — I10 Essential (primary) hypertension: Secondary | ICD-10-CM | POA: Diagnosis not present

## 2017-07-11 DIAGNOSIS — I739 Peripheral vascular disease, unspecified: Secondary | ICD-10-CM | POA: Diagnosis not present

## 2017-07-11 DIAGNOSIS — D649 Anemia, unspecified: Secondary | ICD-10-CM | POA: Diagnosis not present

## 2017-07-11 DIAGNOSIS — M810 Age-related osteoporosis without current pathological fracture: Secondary | ICD-10-CM | POA: Diagnosis not present

## 2017-07-11 DIAGNOSIS — I2510 Atherosclerotic heart disease of native coronary artery without angina pectoris: Secondary | ICD-10-CM | POA: Diagnosis not present

## 2017-07-11 DIAGNOSIS — M150 Primary generalized (osteo)arthritis: Secondary | ICD-10-CM | POA: Diagnosis not present

## 2017-07-11 DIAGNOSIS — M5136 Other intervertebral disc degeneration, lumbar region: Secondary | ICD-10-CM | POA: Diagnosis not present

## 2017-07-11 DIAGNOSIS — G20 Parkinson's disease: Secondary | ICD-10-CM | POA: Diagnosis not present

## 2017-07-13 DIAGNOSIS — I739 Peripheral vascular disease, unspecified: Secondary | ICD-10-CM | POA: Diagnosis not present

## 2017-07-13 DIAGNOSIS — G20 Parkinson's disease: Secondary | ICD-10-CM | POA: Diagnosis not present

## 2017-07-13 DIAGNOSIS — M5136 Other intervertebral disc degeneration, lumbar region: Secondary | ICD-10-CM | POA: Diagnosis not present

## 2017-07-13 DIAGNOSIS — D649 Anemia, unspecified: Secondary | ICD-10-CM | POA: Diagnosis not present

## 2017-07-13 DIAGNOSIS — R296 Repeated falls: Secondary | ICD-10-CM | POA: Diagnosis not present

## 2017-07-13 DIAGNOSIS — M5030 Other cervical disc degeneration, unspecified cervical region: Secondary | ICD-10-CM | POA: Diagnosis not present

## 2017-07-13 DIAGNOSIS — M810 Age-related osteoporosis without current pathological fracture: Secondary | ICD-10-CM | POA: Diagnosis not present

## 2017-07-13 DIAGNOSIS — I2510 Atherosclerotic heart disease of native coronary artery without angina pectoris: Secondary | ICD-10-CM | POA: Diagnosis not present

## 2017-07-13 DIAGNOSIS — I10 Essential (primary) hypertension: Secondary | ICD-10-CM | POA: Diagnosis not present

## 2017-07-13 DIAGNOSIS — M150 Primary generalized (osteo)arthritis: Secondary | ICD-10-CM | POA: Diagnosis not present

## 2017-07-18 DIAGNOSIS — R296 Repeated falls: Secondary | ICD-10-CM | POA: Diagnosis not present

## 2017-07-18 DIAGNOSIS — G20 Parkinson's disease: Secondary | ICD-10-CM | POA: Diagnosis not present

## 2017-07-18 DIAGNOSIS — M5136 Other intervertebral disc degeneration, lumbar region: Secondary | ICD-10-CM | POA: Diagnosis not present

## 2017-07-18 DIAGNOSIS — I2510 Atherosclerotic heart disease of native coronary artery without angina pectoris: Secondary | ICD-10-CM | POA: Diagnosis not present

## 2017-07-18 DIAGNOSIS — D649 Anemia, unspecified: Secondary | ICD-10-CM | POA: Diagnosis not present

## 2017-07-18 DIAGNOSIS — M150 Primary generalized (osteo)arthritis: Secondary | ICD-10-CM | POA: Diagnosis not present

## 2017-07-18 DIAGNOSIS — M5030 Other cervical disc degeneration, unspecified cervical region: Secondary | ICD-10-CM | POA: Diagnosis not present

## 2017-07-18 DIAGNOSIS — M810 Age-related osteoporosis without current pathological fracture: Secondary | ICD-10-CM | POA: Diagnosis not present

## 2017-07-18 DIAGNOSIS — I10 Essential (primary) hypertension: Secondary | ICD-10-CM | POA: Diagnosis not present

## 2017-07-18 DIAGNOSIS — I739 Peripheral vascular disease, unspecified: Secondary | ICD-10-CM | POA: Diagnosis not present

## 2017-07-20 DIAGNOSIS — M150 Primary generalized (osteo)arthritis: Secondary | ICD-10-CM | POA: Diagnosis not present

## 2017-07-20 DIAGNOSIS — I739 Peripheral vascular disease, unspecified: Secondary | ICD-10-CM | POA: Diagnosis not present

## 2017-07-20 DIAGNOSIS — M810 Age-related osteoporosis without current pathological fracture: Secondary | ICD-10-CM | POA: Diagnosis not present

## 2017-07-20 DIAGNOSIS — M5030 Other cervical disc degeneration, unspecified cervical region: Secondary | ICD-10-CM | POA: Diagnosis not present

## 2017-07-20 DIAGNOSIS — I2510 Atherosclerotic heart disease of native coronary artery without angina pectoris: Secondary | ICD-10-CM | POA: Diagnosis not present

## 2017-07-20 DIAGNOSIS — I10 Essential (primary) hypertension: Secondary | ICD-10-CM | POA: Diagnosis not present

## 2017-07-20 DIAGNOSIS — D649 Anemia, unspecified: Secondary | ICD-10-CM | POA: Diagnosis not present

## 2017-07-20 DIAGNOSIS — R296 Repeated falls: Secondary | ICD-10-CM | POA: Diagnosis not present

## 2017-07-20 DIAGNOSIS — M5136 Other intervertebral disc degeneration, lumbar region: Secondary | ICD-10-CM | POA: Diagnosis not present

## 2017-07-20 DIAGNOSIS — G20 Parkinson's disease: Secondary | ICD-10-CM | POA: Diagnosis not present

## 2017-07-20 NOTE — Telephone Encounter (Signed)
Patient was given an appointment for 07/11/18 at 2:00pm with Dr. Laural Golden and she was added to the recall list.

## 2017-07-22 ENCOUNTER — Other Ambulatory Visit: Payer: Self-pay | Admitting: Family Medicine

## 2017-07-23 ENCOUNTER — Other Ambulatory Visit: Payer: Self-pay | Admitting: Family Medicine

## 2017-07-25 DIAGNOSIS — M5136 Other intervertebral disc degeneration, lumbar region: Secondary | ICD-10-CM | POA: Diagnosis not present

## 2017-07-25 DIAGNOSIS — R296 Repeated falls: Secondary | ICD-10-CM | POA: Diagnosis not present

## 2017-07-25 DIAGNOSIS — I739 Peripheral vascular disease, unspecified: Secondary | ICD-10-CM | POA: Diagnosis not present

## 2017-07-25 DIAGNOSIS — I2510 Atherosclerotic heart disease of native coronary artery without angina pectoris: Secondary | ICD-10-CM | POA: Diagnosis not present

## 2017-07-25 DIAGNOSIS — G20 Parkinson's disease: Secondary | ICD-10-CM | POA: Diagnosis not present

## 2017-07-25 DIAGNOSIS — M810 Age-related osteoporosis without current pathological fracture: Secondary | ICD-10-CM | POA: Diagnosis not present

## 2017-07-25 DIAGNOSIS — I10 Essential (primary) hypertension: Secondary | ICD-10-CM | POA: Diagnosis not present

## 2017-07-25 DIAGNOSIS — M150 Primary generalized (osteo)arthritis: Secondary | ICD-10-CM | POA: Diagnosis not present

## 2017-07-25 DIAGNOSIS — D649 Anemia, unspecified: Secondary | ICD-10-CM | POA: Diagnosis not present

## 2017-07-25 DIAGNOSIS — M5030 Other cervical disc degeneration, unspecified cervical region: Secondary | ICD-10-CM | POA: Diagnosis not present

## 2017-07-30 DIAGNOSIS — M5136 Other intervertebral disc degeneration, lumbar region: Secondary | ICD-10-CM | POA: Diagnosis not present

## 2017-07-30 DIAGNOSIS — D649 Anemia, unspecified: Secondary | ICD-10-CM | POA: Diagnosis not present

## 2017-07-30 DIAGNOSIS — I10 Essential (primary) hypertension: Secondary | ICD-10-CM | POA: Diagnosis not present

## 2017-07-30 DIAGNOSIS — M5030 Other cervical disc degeneration, unspecified cervical region: Secondary | ICD-10-CM | POA: Diagnosis not present

## 2017-07-30 DIAGNOSIS — G20 Parkinson's disease: Secondary | ICD-10-CM | POA: Diagnosis not present

## 2017-07-30 DIAGNOSIS — R296 Repeated falls: Secondary | ICD-10-CM | POA: Diagnosis not present

## 2017-07-30 DIAGNOSIS — I739 Peripheral vascular disease, unspecified: Secondary | ICD-10-CM | POA: Diagnosis not present

## 2017-07-30 DIAGNOSIS — M150 Primary generalized (osteo)arthritis: Secondary | ICD-10-CM | POA: Diagnosis not present

## 2017-07-30 DIAGNOSIS — M810 Age-related osteoporosis without current pathological fracture: Secondary | ICD-10-CM | POA: Diagnosis not present

## 2017-07-30 DIAGNOSIS — I2510 Atherosclerotic heart disease of native coronary artery without angina pectoris: Secondary | ICD-10-CM | POA: Diagnosis not present

## 2017-08-01 DIAGNOSIS — M150 Primary generalized (osteo)arthritis: Secondary | ICD-10-CM | POA: Diagnosis not present

## 2017-08-01 DIAGNOSIS — G20 Parkinson's disease: Secondary | ICD-10-CM | POA: Diagnosis not present

## 2017-08-01 DIAGNOSIS — M810 Age-related osteoporosis without current pathological fracture: Secondary | ICD-10-CM | POA: Diagnosis not present

## 2017-08-01 DIAGNOSIS — D649 Anemia, unspecified: Secondary | ICD-10-CM | POA: Diagnosis not present

## 2017-08-01 DIAGNOSIS — R296 Repeated falls: Secondary | ICD-10-CM | POA: Diagnosis not present

## 2017-08-01 DIAGNOSIS — I2510 Atherosclerotic heart disease of native coronary artery without angina pectoris: Secondary | ICD-10-CM | POA: Diagnosis not present

## 2017-08-01 DIAGNOSIS — I739 Peripheral vascular disease, unspecified: Secondary | ICD-10-CM | POA: Diagnosis not present

## 2017-08-01 DIAGNOSIS — M5136 Other intervertebral disc degeneration, lumbar region: Secondary | ICD-10-CM | POA: Diagnosis not present

## 2017-08-01 DIAGNOSIS — M5030 Other cervical disc degeneration, unspecified cervical region: Secondary | ICD-10-CM | POA: Diagnosis not present

## 2017-08-01 DIAGNOSIS — I10 Essential (primary) hypertension: Secondary | ICD-10-CM | POA: Diagnosis not present

## 2017-08-03 DIAGNOSIS — G20 Parkinson's disease: Secondary | ICD-10-CM | POA: Diagnosis not present

## 2017-08-03 DIAGNOSIS — M150 Primary generalized (osteo)arthritis: Secondary | ICD-10-CM | POA: Diagnosis not present

## 2017-08-03 DIAGNOSIS — M810 Age-related osteoporosis without current pathological fracture: Secondary | ICD-10-CM | POA: Diagnosis not present

## 2017-08-03 DIAGNOSIS — I10 Essential (primary) hypertension: Secondary | ICD-10-CM | POA: Diagnosis not present

## 2017-08-03 DIAGNOSIS — R296 Repeated falls: Secondary | ICD-10-CM | POA: Diagnosis not present

## 2017-08-03 DIAGNOSIS — D649 Anemia, unspecified: Secondary | ICD-10-CM | POA: Diagnosis not present

## 2017-08-03 DIAGNOSIS — I739 Peripheral vascular disease, unspecified: Secondary | ICD-10-CM | POA: Diagnosis not present

## 2017-08-03 DIAGNOSIS — M5030 Other cervical disc degeneration, unspecified cervical region: Secondary | ICD-10-CM | POA: Diagnosis not present

## 2017-08-03 DIAGNOSIS — M5136 Other intervertebral disc degeneration, lumbar region: Secondary | ICD-10-CM | POA: Diagnosis not present

## 2017-08-03 DIAGNOSIS — I2510 Atherosclerotic heart disease of native coronary artery without angina pectoris: Secondary | ICD-10-CM | POA: Diagnosis not present

## 2017-08-04 DIAGNOSIS — I10 Essential (primary) hypertension: Secondary | ICD-10-CM | POA: Diagnosis not present

## 2017-08-04 DIAGNOSIS — M150 Primary generalized (osteo)arthritis: Secondary | ICD-10-CM | POA: Diagnosis not present

## 2017-08-04 DIAGNOSIS — G20 Parkinson's disease: Secondary | ICD-10-CM | POA: Diagnosis not present

## 2017-08-04 DIAGNOSIS — R296 Repeated falls: Secondary | ICD-10-CM | POA: Diagnosis not present

## 2017-08-04 DIAGNOSIS — I2510 Atherosclerotic heart disease of native coronary artery without angina pectoris: Secondary | ICD-10-CM | POA: Diagnosis not present

## 2017-08-04 DIAGNOSIS — I739 Peripheral vascular disease, unspecified: Secondary | ICD-10-CM | POA: Diagnosis not present

## 2017-08-04 DIAGNOSIS — M5136 Other intervertebral disc degeneration, lumbar region: Secondary | ICD-10-CM | POA: Diagnosis not present

## 2017-08-04 DIAGNOSIS — M810 Age-related osteoporosis without current pathological fracture: Secondary | ICD-10-CM | POA: Diagnosis not present

## 2017-08-04 DIAGNOSIS — D649 Anemia, unspecified: Secondary | ICD-10-CM | POA: Diagnosis not present

## 2017-08-04 DIAGNOSIS — M5030 Other cervical disc degeneration, unspecified cervical region: Secondary | ICD-10-CM | POA: Diagnosis not present

## 2017-08-05 ENCOUNTER — Emergency Department (HOSPITAL_COMMUNITY): Payer: Medicare HMO

## 2017-08-05 ENCOUNTER — Encounter (HOSPITAL_COMMUNITY): Payer: Self-pay

## 2017-08-05 ENCOUNTER — Emergency Department (HOSPITAL_COMMUNITY)
Admission: EM | Admit: 2017-08-05 | Discharge: 2017-08-05 | Disposition: A | Discharge status: Home / Self Care | Payer: Medicare HMO | Attending: Emergency Medicine | Admitting: Emergency Medicine

## 2017-08-05 DIAGNOSIS — S0990XA Unspecified injury of head, initial encounter: Secondary | ICD-10-CM | POA: Insufficient documentation

## 2017-08-05 DIAGNOSIS — S42032A Displaced fracture of lateral end of left clavicle, initial encounter for closed fracture: Secondary | ICD-10-CM | POA: Diagnosis not present

## 2017-08-05 DIAGNOSIS — Z87891 Personal history of nicotine dependence: Secondary | ICD-10-CM | POA: Insufficient documentation

## 2017-08-05 DIAGNOSIS — Z79899 Other long term (current) drug therapy: Secondary | ICD-10-CM | POA: Diagnosis not present

## 2017-08-05 DIAGNOSIS — G20 Parkinson's disease: Secondary | ICD-10-CM | POA: Insufficient documentation

## 2017-08-05 DIAGNOSIS — W01190A Fall on same level from slipping, tripping and stumbling with subsequent striking against furniture, initial encounter: Secondary | ICD-10-CM | POA: Diagnosis not present

## 2017-08-05 DIAGNOSIS — Y999 Unspecified external cause status: Secondary | ICD-10-CM | POA: Insufficient documentation

## 2017-08-05 DIAGNOSIS — R030 Elevated blood-pressure reading, without diagnosis of hypertension: Secondary | ICD-10-CM | POA: Diagnosis not present

## 2017-08-05 DIAGNOSIS — M25512 Pain in left shoulder: Secondary | ICD-10-CM | POA: Diagnosis not present

## 2017-08-05 DIAGNOSIS — Y939 Activity, unspecified: Secondary | ICD-10-CM | POA: Insufficient documentation

## 2017-08-05 DIAGNOSIS — S42002A Fracture of unspecified part of left clavicle, initial encounter for closed fracture: Secondary | ICD-10-CM | POA: Insufficient documentation

## 2017-08-05 DIAGNOSIS — Y929 Unspecified place or not applicable: Secondary | ICD-10-CM | POA: Insufficient documentation

## 2017-08-05 DIAGNOSIS — I10 Essential (primary) hypertension: Secondary | ICD-10-CM | POA: Diagnosis not present

## 2017-08-05 DIAGNOSIS — R0781 Pleurodynia: Secondary | ICD-10-CM | POA: Diagnosis not present

## 2017-08-05 DIAGNOSIS — M546 Pain in thoracic spine: Secondary | ICD-10-CM | POA: Diagnosis not present

## 2017-08-05 DIAGNOSIS — S199XXA Unspecified injury of neck, initial encounter: Secondary | ICD-10-CM | POA: Diagnosis not present

## 2017-08-05 DIAGNOSIS — R51 Headache: Secondary | ICD-10-CM | POA: Diagnosis not present

## 2017-08-05 DIAGNOSIS — Z791 Long term (current) use of non-steroidal anti-inflammatories (NSAID): Secondary | ICD-10-CM | POA: Insufficient documentation

## 2017-08-05 DIAGNOSIS — W19XXXA Unspecified fall, initial encounter: Secondary | ICD-10-CM

## 2017-08-05 DIAGNOSIS — S4992XA Unspecified injury of left shoulder and upper arm, initial encounter: Secondary | ICD-10-CM | POA: Diagnosis present

## 2017-08-05 DIAGNOSIS — M542 Cervicalgia: Secondary | ICD-10-CM | POA: Diagnosis not present

## 2017-08-05 MED ORDER — HYDROCODONE-ACETAMINOPHEN 5-325 MG PO TABS: 1.0000 | ORAL_TABLET | Freq: Four times a day (QID) | ORAL | 0 refills | Status: DC | PRN

## 2017-08-05 NOTE — ED Provider Notes (Signed)
[facility] DEPT Provider Note   CSN: [id number] Arrival date & time: 08/05/17  1603     History   Chief Complaint Chief Complaint  Patient presents with  . Fall    HPI Kerry Flynn is a 83 y.o. female.  The pt states she tripped and fell and hit her head and left shoulder   The history is provided by the patient. No language interpreter was used.  Fall  This is a new problem. The current episode started 6 to 12 hours ago. The problem occurs rarely. The problem has been resolved. Pertinent negatives include no chest pain, no abdominal pain and no headaches. Exacerbated by: movement. Nothing relieves the symptoms. She has tried nothing for the symptoms. The treatment provided no relief.    Past Medical History:  Diagnosis Date  . Abnormality of gait 11/17/2015  . Anemia   . Barrett's esophagus    Dr. Laural Golden, Dr. Moshe Cipro  . Benign recurrent vertigo   . Chronic constipation   . Chronic knee pain   . Colon polyps   . DDD (degenerative disc disease), cervical   . DDD (degenerative disc disease), lumbar   . Dermatitis    recurrent  . Diverticulosis   . 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 Fatigue    chronic  . GERD (gastroesophageal reflux disease)   . Head trauma    status post fall; upper and lower extremities   . Hemorrhoid   . Hemorrhoids   . Hip fracture, left ([place])    s/p left arthroplasty 2/16  . Hypertension   . Infertility, female   . Intermittent vertigo   . Osteoarthritis    of the neck  . Osteoporosis    knees  . Parkinson disease ([place]) 11/17/2015  . Peripheral edema    chronic  . Stable angina ([facility])   . Varicose veins   . Vitamin D deficiency     Patient Active Problem List   Diagnosis Date Noted  . Toe ulcer ([place]) 05/02/2017  . PVD (peripheral vascular disease) ([place]) 10/18/2016  . Nocturnal leg cramps 09/29/2016  . Abnormality of gait 11/17/2015  . Parkinson disease ([place]) 11/17/2015  . 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 Hearing  loss in left ear 10/15/2015  . Tremor 08/27/2015  . IDA (iron deficiency anemia) 01/31/2015  . Multiple thyroid nodules 04/04/2014  . GERD (gastroesophageal reflux disease) 04/04/2014  . Postmenopausal atrophic vaginitis 03/12/2014  . Vitamin D deficiency 03/12/2014  . Osteopetrosis 10/23/2013  . Urinary incontinence, urge 10/23/2013  . Colon polyps 02/27/2012  . CAD, NATIVE VESSEL 12/12/2009  . Osteoarthritis, generalized 04/19/2009  . Essential hypertension 10/16/2008  . HEMORRHOIDS 03/29/2008  . INTERMITTENT VERTIGO 03/29/2008    Past Surgical History:  Procedure Laterality Date  . CARDIAC CATHETERIZATION  09/02/2005   mild-mod calcification in mid LAD (20-30% luminal obstruction), otherwise normal coronaries, patent LE arteries (Dr. Jackie Plum)  . CAROTID DOPPLER  02/2008   R & L ICAs 0-49% diameter reduction  . CATARACT EXTRACTION W/ INTRAOCULAR LENS IMPLANT Left 01/06/2012   left, [place]  . CHOLECYSTECTOMY    . CHOLECYSTECTOMY, LAPAROSCOPIC  06/17/2006   with stones  . COLONOSCOPY  04/19/2012   Procedure: COLONOSCOPY;  Surgeon: Rogene Houston, MD;  Location: [facility];  Service: Endoscopy;  Laterality: N/A;  1200  . COLONOSCOPY N/A 09/25/2015   Procedure: COLONOSCOPY;  Surgeon: Rogene Houston, MD;  Location: [facility];  Service: Endoscopy;  Laterality: N/A;  100  . COLONOSCOPY  W/ POLYPECTOMY  05/29/1999, 01/12/2012  . dental implant   09/2014  . ESOPHAGOGASTRODUODENOSCOPY  01/12/2012  . EYE SURGERY  01/24/2012   bilateral cataract extraction  . HIP ARTHROPLASTY Left 01/31/2015   Procedure: ARTHROPLASTY BIPOLAR HIP;  Surgeon: Carole Civil, MD;  Location: [facility] ORS;  Service: Orthopedics;  Laterality: Left;  . KNEE ARTHROSCOPY Right 1999  . KNEE ARTHROSCOPY Left 1996  . NM MYOCAR PERF WALL MOTION  02/2012   lexiscan - small, fized apical lateral bowel attenuation artifact; no reversible ischemia; EF 77%; non-diagnostic for ischemia; low risk  . PALATE SURGERY  1988    . ROTATOR CUFF REPAIR  05/14/2010  . TONSILLECTOMY    . TONSILLECTOMY AND ADENOIDECTOMY  1934  . TRANSTHORACIC ECHOCARDIOGRAM  01/2012   EF=>55%; mild mitral annular calcification & mild MR; trace TR; trace pulm valve regurg  . UPPER GI ENDOSCOPY  12/12/2006   with biopsy, with colonoscopy - dx: Barrett's disease    OB History    Gravida Para Term Preterm AB Living   0 0           SAB TAB Ectopic Multiple Live Births                   Home Medications    Prior to Admission medications   Medication Sig Start Date End Date Taking? Authorizing Provider  acetaminophen (TYLENOL) 500 MG tablet Take 500 mg by mouth every 6 (six) hours as needed for mild pain or moderate pain.    Yes [provider]  amLODipine (NORVASC) 5 MG tablet TAKE ONE TABLET BY MOUTH ONCE DAILY 06/22/17  Yes Fayrene Helper, MD  Calcium-Vitamin D-Vitamin K 500-100-40 MG-UNT-MCG CHEW Chew 1 tablet by mouth 2 (two) times daily.    Yes [provider]  carbidopa-levodopa (SINEMET IR) 25-100 MG tablet Take 1 tablet by mouth 3 (three) times daily. 05/11/17  Yes Kathrynn Ducking, MD  docusate sodium (COLACE) 100 MG capsule Take 100 mg by mouth daily.    Yes [provider]  esomeprazole (NEXIUM) 20 MG capsule Take 20 mg by mouth daily at 12 noon.   Yes [provider]  Magnesium Oxide (PHILLIPS) 500 MG (LAX) TABS Take 1 tablet (500 mg total) by mouth daily as needed. 02/03/15  Yes Black, Lezlie Octave, NP  meloxicam (MOBIC) 15 MG tablet TAKE 1 TABLET BY MOUTH DAILY 07/22/17  Yes Fayrene Helper, MD  MYRBETRIQ 50 MG TB24 tablet TAKE 1 TABLET (50 MG TOTAL) BY MOUTH DAILY. 07/26/17  Yes Fayrene Helper, MD  naproxen sodium (ALEVE) 220 MG tablet Take 220 mg by mouth daily as needed (for pain/muscle spasms/restless leg).   Yes [provider]  nitroGLYCERIN (NITROSTAT) 0.4 MG SL tablet Place 0.4 mg under the tongue every 5 (five) minutes as needed. 1 tablet under the tongue at onset  of chest pain: you may repeat every 5 minutes for up to 3 doses   Yes [provider]  Probiotic Product (HEALTHY COLON) CAPS Take 1 capsule by mouth daily.    Yes [provider]  traMADol (ULTRAM) 50 MG tablet Take 50 mg by mouth 3 (three) times daily as needed for moderate pain or severe pain.  09/15/16  Yes [provider]  HYDROcodone-acetaminophen (NORCO/VICODIN) 5-325 MG tablet Take 1 tablet by mouth every 6 (six) hours as needed for moderate pain. 08/05/17   Milton Ferguson, MD    Family History Family History  Problem Relation Age of Onset  .  Diabetes Mother   . Hypertension Mother   . CVA Mother   . Kidney disease Mother   . Heart failure Mother   . Arthritis Mother   . Cancer Father        larengeal  . Heart disease Father   . Congestive Heart Failure Father   . Bradycardia Father   . Hypotension Father   . Parkinson's disease Sister   . Osteoporosis Sister   . Stroke Maternal Grandmother   . Stroke Maternal Grandfather   . Colon cancer Neg Hx     Social History Social History  Substance Use Topics  . Smoking status: Former Smoker    Packs/day: 0.25    Years: 0.50    Quit date: 12/27/1958  . Smokeless tobacco: Never Used     Comment: only smoked for a few months  . Alcohol use No     Allergies   Penicillins; Clarithromycin; and Flagyl [metronidazole]   Review of Systems Review of Systems  Constitutional: Negative for appetite change and fatigue.  HENT: Negative for congestion, ear discharge and sinus pressure.        Headache  Eyes: Negative for discharge.  Respiratory: Negative for cough.   Cardiovascular: Negative for chest pain.  Gastrointestinal: Negative for abdominal pain and diarrhea.  Genitourinary: Negative for frequency and hematuria.  Musculoskeletal: Negative for back pain.       Left shoulder pain  Skin: Negative for rash.  Neurological: Negative for seizures and headaches.  Psychiatric/Behavioral: Negative for  hallucinations.     Physical Exam Updated Vital Signs BP (!) 165/76 (BP Location: Right Arm)   Pulse 76   Temp 98.1 F (36.7 C) (Oral)   Resp 17   Ht 5\' 6"  (1.676 m)   Wt 63.5 kg (140 lb)   SpO2 96%   BMI 22.60 kg/m   Physical Exam  Constitutional: She is oriented to person, place, and time. She appears well-developed.  HENT:  Head: Normocephalic.  Eyes: Conjunctivae and EOM are normal. No scleral icterus.  Neck: Neck supple. No thyromegaly present.  Cardiovascular: Normal rate and regular rhythm.  Exam reveals no gallop and no friction rub.   No murmur heard. Pulmonary/Chest: No stridor. She has no wheezes. She has no rales. She exhibits no tenderness.  Abdominal: She exhibits no distension. There is no tenderness. There is no rebound.  Musculoskeletal: She exhibits no edema.  Tender left shoulder and clavicle  Lymphadenopathy:    She has no cervical adenopathy.  Neurological: She is oriented to person, place, and time. She exhibits normal muscle tone. Coordination normal.  Skin: No rash noted. No erythema.  Psychiatric: She has a normal mood and affect. Her behavior is normal.     ED Treatments / Results  Labs (all labs ordered are listed, but only abnormal results are displayed) Labs Reviewed - No data to display  EKG  EKG Interpretation None       Radiology Ct Head Wo Contrast  Result Date: 08/05/2017 CLINICAL DATA:  Left body pain after falling at home today. EXAM: CT HEAD WITHOUT CONTRAST CT CERVICAL SPINE WITHOUT CONTRAST TECHNIQUE: Multidetector CT imaging of the head and cervical spine was performed following the standard protocol without intravenous contrast. Multiplanar CT image reconstructions of the cervical spine were also generated. COMPARISON:  Head CT dated 01/30/2015 and cervical spine radiographs dated 02/13/2013. FINDINGS: CT HEAD FINDINGS Brain: Diffusely enlarged ventricles and subarachnoid spaces. Patchy white matter low density in both  cerebral hemispheres. No intracranial hemorrhage,  mass lesion or CT evidence of acute infarction. Vascular: No hyperdense vessel or unexpected calcification. Skull: Normal. Negative for fracture or focal lesion. Sinuses/Orbits: Status post bilateral cataract extraction. Unremarkable paranasal sinuses. Other: None. CT CERVICAL SPINE FINDINGS Alignment: Reversal the normal cervical lordosis. Interval mild anterolisthesis at the C3-4 and C4-5 levels. Skull base and vertebrae: No acute fracture. No primary bone lesion or focal pathologic process. Soft tissues and spinal canal: No prevertebral fluid or swelling. No visible canal hematoma. Disc levels: Mild degenerative changes at multiple levels. There are also moderate facet degenerative changes at multiple levels, including the C3-4 C4-5 levels. Upper chest: Clear lung apices. Other: Thyroid nodules. The largest is in the left lobe, measuring 9.5 mm in maximum diameter. Mild bilateral carotid artery calcifications. IMPRESSION: 1. No skull fracture or intracranial hemorrhage. 2. No cervical spine fracture or traumatic subluxation. 3. Stable diffuse cerebral and cerebellar atrophy and chronic small vessel white matter ischemic changes in both cerebral hemispheres. 4. Reversal of the normal cervical lordosis and mild multilevel degenerative changes. 5. Sub-centimeter thyroid nodule(s) noted, too small to characterize, but most likely benign in the absence of known clinical risk factors for thyroid carcinoma. 6. Mild bilateral carotid artery atheromatous calcification. Electronically Signed   By: Claudie Revering M.D.   On: 08/05/2017 17:32   Ct Cervical Spine Wo Contrast  Result Date: 08/05/2017 CLINICAL DATA:  Left body pain after falling at home today. EXAM: CT HEAD WITHOUT CONTRAST CT CERVICAL SPINE WITHOUT CONTRAST TECHNIQUE: Multidetector CT imaging of the head and cervical spine was performed following the standard protocol without intravenous contrast. Multiplanar  CT image reconstructions of the cervical spine were also generated. COMPARISON:  Head CT dated 01/30/2015 and cervical spine radiographs dated 02/13/2013. FINDINGS: CT HEAD FINDINGS Brain: Diffusely enlarged ventricles and subarachnoid spaces. Patchy white matter low density in both cerebral hemispheres. No intracranial hemorrhage, mass lesion or CT evidence of acute infarction. Vascular: No hyperdense vessel or unexpected calcification. Skull: Normal. Negative for fracture or focal lesion. Sinuses/Orbits: Status post bilateral cataract extraction. Unremarkable paranasal sinuses. Other: None. CT CERVICAL SPINE FINDINGS Alignment: Reversal the normal cervical lordosis. Interval mild anterolisthesis at the C3-4 and C4-5 levels. Skull base and vertebrae: No acute fracture. No primary bone lesion or focal pathologic process. Soft tissues and spinal canal: No prevertebral fluid or swelling. No visible canal hematoma. Disc levels: Mild degenerative changes at multiple levels. There are also moderate facet degenerative changes at multiple levels, including the C3-4 C4-5 levels. Upper chest: Clear lung apices. Other: Thyroid nodules. The largest is in the left lobe, measuring 9.5 mm in maximum diameter. Mild bilateral carotid artery calcifications. IMPRESSION: 1. No skull fracture or intracranial hemorrhage. 2. No cervical spine fracture or traumatic subluxation. 3. Stable diffuse cerebral and cerebellar atrophy and chronic small vessel white matter ischemic changes in both cerebral hemispheres. 4. Reversal of the normal cervical lordosis and mild multilevel degenerative changes. 5. Sub-centimeter thyroid nodule(s) noted, too small to characterize, but most likely benign in the absence of known clinical risk factors for thyroid carcinoma. 6. Mild bilateral carotid artery atheromatous calcification. Electronically Signed   By: Claudie Revering M.D.   On: 08/05/2017 17:32   Dg Shoulder Left  Result Date: 08/05/2017 CLINICAL  DATA:  Left shoulder pain following fall, initial encounter EXAM: LEFT SHOULDER - 2+ VIEW COMPARISON:  05/19/2017 FINDINGS: There is a mildly comminuted distal left clavicular fracture identified. No dislocation of the humeral head is seen. The underlying bony thorax is within normal limits.  IMPRESSION: Comminuted distal left clavicular fracture. Electronically Signed   By: Inez Catalina M.D.   On: 08/05/2017 17:20    Procedures Procedures (including critical care time)  Medications Ordered in ED Medications - No data to display   Initial Impression / Assessment and Plan / ED Course  I have reviewed the triage vital signs and the nursing notes.  Pertinent labs & imaging results that were available during my care of the patient were reviewed by me and considered in my medical decision making (see chart for details).     Patient with fractured clavicle. She'll be given a sling and pain medicines will follow-up with her orthopedic doctor  Final Clinical Impressions(s) / ED Diagnoses   Final diagnoses:  Fall, initial encounter    New Prescriptions New Prescriptions   HYDROCODONE-ACETAMINOPHEN (NORCO/VICODIN) 5-325 MG TABLET    Take 1 tablet by mouth every 6 (six) hours as needed for moderate pain.     Milton Ferguson, MD 08/05/17 2009

## 2017-08-05 NOTE — Discharge Instructions (Signed)
Follow up with dr. Harrison next week. °

## 2017-08-05 NOTE — ED Triage Notes (Signed)
Patient brought in by [facility] for fall at home. Patient states she tripped at home and fell onto door facing. States she normally has a Actuary with her but sitter went out of home for a short time. Denies loc, neck or back pain. Complains of pain to left shoulder, bilateral knees, right rib and knot on head. A&Ox4.

## 2017-08-05 NOTE — ED Notes (Signed)
ED Provider at bedside. 

## 2017-08-08 ENCOUNTER — Telehealth: Payer: Self-pay

## 2017-08-08 DIAGNOSIS — R296 Repeated falls: Secondary | ICD-10-CM | POA: Diagnosis not present

## 2017-08-08 DIAGNOSIS — G20 Parkinson's disease: Secondary | ICD-10-CM | POA: Diagnosis not present

## 2017-08-08 DIAGNOSIS — M810 Age-related osteoporosis without current pathological fracture: Secondary | ICD-10-CM | POA: Diagnosis not present

## 2017-08-08 DIAGNOSIS — I10 Essential (primary) hypertension: Secondary | ICD-10-CM | POA: Diagnosis not present

## 2017-08-08 DIAGNOSIS — I739 Peripheral vascular disease, unspecified: Secondary | ICD-10-CM | POA: Diagnosis not present

## 2017-08-08 DIAGNOSIS — M150 Primary generalized (osteo)arthritis: Secondary | ICD-10-CM | POA: Diagnosis not present

## 2017-08-08 DIAGNOSIS — D649 Anemia, unspecified: Secondary | ICD-10-CM | POA: Diagnosis not present

## 2017-08-08 DIAGNOSIS — M5136 Other intervertebral disc degeneration, lumbar region: Secondary | ICD-10-CM | POA: Diagnosis not present

## 2017-08-08 DIAGNOSIS — I2510 Atherosclerotic heart disease of native coronary artery without angina pectoris: Secondary | ICD-10-CM | POA: Diagnosis not present

## 2017-08-08 DIAGNOSIS — M5030 Other cervical disc degeneration, unspecified cervical region: Secondary | ICD-10-CM | POA: Diagnosis not present

## 2017-08-08 NOTE — Telephone Encounter (Signed)
Message left on phone will try again in am

## 2017-08-08 NOTE — Telephone Encounter (Signed)
Patients niece Kerry Flynn called and is requesting a call from Dr Moshe Cipro. Pt fell recently and fractured her collarbone. Pt is not able to ambulate with her walker and she is needing advice on a facility where she could get some rehab and what Dr recommends for her since she is having a hard time caring for patient and the house is difficult for her to have ample space to get around. Please advise (we do have permission to speak to her niece, Kerry Flynn) [phone]

## 2017-08-08 NOTE — Telephone Encounter (Signed)
I called Kerry Flynn, states she appreciated her niece's involvement and welcomes any help she can get, I advised her I will call niece again in am

## 2017-08-09 ENCOUNTER — Telehealth: Payer: Self-pay | Admitting: Family Medicine

## 2017-08-09 NOTE — Telephone Encounter (Signed)
Lenor Derrick is calling from [facility]   This is reference to the rolling walker

## 2017-08-09 NOTE — Telephone Encounter (Signed)
Direct contact made, Shirlean Mylar is looking at sNF/ assisted living facilities in [place] and will contact us with info to make contact to see if direct short term admit is posible  pls also send in order to [facility] for a nEW walker to [employer] check pt I think che needs one with a bench, I think that is what she has pls order I gather the one she has the wheels are broken

## 2017-08-09 NOTE — Telephone Encounter (Signed)
[facility] to [facility] bc [facility] is out of network

## 2017-08-09 NOTE — Telephone Encounter (Signed)
Faxed to [facility] and niece is aware

## 2017-08-10 DIAGNOSIS — M79674 Pain in right toe(s): Secondary | ICD-10-CM | POA: Diagnosis not present

## 2017-08-10 DIAGNOSIS — I2510 Atherosclerotic heart disease of native coronary artery without angina pectoris: Secondary | ICD-10-CM | POA: Diagnosis not present

## 2017-08-10 DIAGNOSIS — N3941 Urge incontinence: Secondary | ICD-10-CM | POA: Diagnosis not present

## 2017-08-10 DIAGNOSIS — E042 Nontoxic multinodular goiter: Secondary | ICD-10-CM | POA: Diagnosis not present

## 2017-08-10 DIAGNOSIS — D649 Anemia, unspecified: Secondary | ICD-10-CM | POA: Diagnosis not present

## 2017-08-10 DIAGNOSIS — L97511 Non-pressure chronic ulcer of other part of right foot limited to breakdown of skin: Secondary | ICD-10-CM | POA: Diagnosis not present

## 2017-08-10 DIAGNOSIS — R296 Repeated falls: Secondary | ICD-10-CM | POA: Diagnosis not present

## 2017-08-10 DIAGNOSIS — G20 Parkinson's disease: Secondary | ICD-10-CM | POA: Diagnosis not present

## 2017-08-10 DIAGNOSIS — M5136 Other intervertebral disc degeneration, lumbar region: Secondary | ICD-10-CM | POA: Diagnosis not present

## 2017-08-10 DIAGNOSIS — M150 Primary generalized (osteo)arthritis: Secondary | ICD-10-CM | POA: Diagnosis not present

## 2017-08-10 DIAGNOSIS — I739 Peripheral vascular disease, unspecified: Secondary | ICD-10-CM | POA: Diagnosis not present

## 2017-08-10 DIAGNOSIS — M5030 Other cervical disc degeneration, unspecified cervical region: Secondary | ICD-10-CM | POA: Diagnosis not present

## 2017-08-10 DIAGNOSIS — G8929 Other chronic pain: Secondary | ICD-10-CM | POA: Diagnosis not present

## 2017-08-10 DIAGNOSIS — M810 Age-related osteoporosis without current pathological fracture: Secondary | ICD-10-CM | POA: Diagnosis not present

## 2017-08-10 DIAGNOSIS — I10 Essential (primary) hypertension: Secondary | ICD-10-CM | POA: Diagnosis not present

## 2017-08-10 DIAGNOSIS — M159 Polyosteoarthritis, unspecified: Secondary | ICD-10-CM | POA: Diagnosis not present

## 2017-08-11 ENCOUNTER — Ambulatory Visit: Payer: Medicare HMO | Admitting: Neurology

## 2017-08-12 ENCOUNTER — Telehealth: Payer: Self-pay | Admitting: Family Medicine

## 2017-08-12 NOTE — Telephone Encounter (Signed)
Kerry Flynn, left message on nurse line regarding patient. She asks that clinical records are faxed to [phone] for patient to be admitted for skilled nursing rehab. After it is faxed, call and speak to [place] at [phone].

## 2017-08-15 DIAGNOSIS — M810 Age-related osteoporosis without current pathological fracture: Secondary | ICD-10-CM | POA: Diagnosis not present

## 2017-08-15 DIAGNOSIS — I2510 Atherosclerotic heart disease of native coronary artery without angina pectoris: Secondary | ICD-10-CM | POA: Diagnosis not present

## 2017-08-15 DIAGNOSIS — I10 Essential (primary) hypertension: Secondary | ICD-10-CM | POA: Diagnosis not present

## 2017-08-15 DIAGNOSIS — M5030 Other cervical disc degeneration, unspecified cervical region: Secondary | ICD-10-CM | POA: Diagnosis not present

## 2017-08-15 DIAGNOSIS — I739 Peripheral vascular disease, unspecified: Secondary | ICD-10-CM | POA: Diagnosis not present

## 2017-08-15 DIAGNOSIS — M5136 Other intervertebral disc degeneration, lumbar region: Secondary | ICD-10-CM | POA: Diagnosis not present

## 2017-08-15 DIAGNOSIS — M150 Primary generalized (osteo)arthritis: Secondary | ICD-10-CM | POA: Diagnosis not present

## 2017-08-15 DIAGNOSIS — D649 Anemia, unspecified: Secondary | ICD-10-CM | POA: Diagnosis not present

## 2017-08-15 DIAGNOSIS — R296 Repeated falls: Secondary | ICD-10-CM | POA: Diagnosis not present

## 2017-08-15 DIAGNOSIS — G20 Parkinson's disease: Secondary | ICD-10-CM | POA: Diagnosis not present

## 2017-08-15 NOTE — Telephone Encounter (Signed)
Office visit faxed over

## 2017-08-17 DIAGNOSIS — M150 Primary generalized (osteo)arthritis: Secondary | ICD-10-CM | POA: Diagnosis not present

## 2017-08-17 DIAGNOSIS — G20 Parkinson's disease: Secondary | ICD-10-CM | POA: Diagnosis not present

## 2017-08-17 DIAGNOSIS — M5136 Other intervertebral disc degeneration, lumbar region: Secondary | ICD-10-CM | POA: Diagnosis not present

## 2017-08-17 DIAGNOSIS — I739 Peripheral vascular disease, unspecified: Secondary | ICD-10-CM | POA: Diagnosis not present

## 2017-08-17 DIAGNOSIS — R296 Repeated falls: Secondary | ICD-10-CM | POA: Diagnosis not present

## 2017-08-17 DIAGNOSIS — M5030 Other cervical disc degeneration, unspecified cervical region: Secondary | ICD-10-CM | POA: Diagnosis not present

## 2017-08-17 DIAGNOSIS — D649 Anemia, unspecified: Secondary | ICD-10-CM | POA: Diagnosis not present

## 2017-08-17 DIAGNOSIS — I2510 Atherosclerotic heart disease of native coronary artery without angina pectoris: Secondary | ICD-10-CM | POA: Diagnosis not present

## 2017-08-17 DIAGNOSIS — M810 Age-related osteoporosis without current pathological fracture: Secondary | ICD-10-CM | POA: Diagnosis not present

## 2017-08-17 DIAGNOSIS — I10 Essential (primary) hypertension: Secondary | ICD-10-CM | POA: Diagnosis not present

## 2017-08-18 DIAGNOSIS — M1712 Unilateral primary osteoarthritis, left knee: Secondary | ICD-10-CM | POA: Diagnosis not present

## 2017-08-18 DIAGNOSIS — M898X1 Other specified disorders of bone, shoulder: Secondary | ICD-10-CM | POA: Diagnosis not present

## 2017-08-18 DIAGNOSIS — S42032A Displaced fracture of lateral end of left clavicle, initial encounter for closed fracture: Secondary | ICD-10-CM | POA: Diagnosis not present

## 2017-08-18 DIAGNOSIS — M1711 Unilateral primary osteoarthritis, right knee: Secondary | ICD-10-CM | POA: Diagnosis not present

## 2017-08-22 ENCOUNTER — Telehealth: Payer: Self-pay | Admitting: Family Medicine

## 2017-08-22 DIAGNOSIS — M150 Primary generalized (osteo)arthritis: Secondary | ICD-10-CM | POA: Diagnosis not present

## 2017-08-22 DIAGNOSIS — M810 Age-related osteoporosis without current pathological fracture: Secondary | ICD-10-CM | POA: Diagnosis not present

## 2017-08-22 DIAGNOSIS — D649 Anemia, unspecified: Secondary | ICD-10-CM | POA: Diagnosis not present

## 2017-08-22 DIAGNOSIS — I2510 Atherosclerotic heart disease of native coronary artery without angina pectoris: Secondary | ICD-10-CM | POA: Diagnosis not present

## 2017-08-22 DIAGNOSIS — M5136 Other intervertebral disc degeneration, lumbar region: Secondary | ICD-10-CM | POA: Diagnosis not present

## 2017-08-22 DIAGNOSIS — G20 Parkinson's disease: Secondary | ICD-10-CM | POA: Diagnosis not present

## 2017-08-22 DIAGNOSIS — I10 Essential (primary) hypertension: Secondary | ICD-10-CM | POA: Diagnosis not present

## 2017-08-22 DIAGNOSIS — I739 Peripheral vascular disease, unspecified: Secondary | ICD-10-CM | POA: Diagnosis not present

## 2017-08-22 DIAGNOSIS — R296 Repeated falls: Secondary | ICD-10-CM | POA: Diagnosis not present

## 2017-08-22 DIAGNOSIS — M5030 Other cervical disc degeneration, unspecified cervical region: Secondary | ICD-10-CM | POA: Diagnosis not present

## 2017-08-22 NOTE — Telephone Encounter (Signed)
Last week we were trying to place her in a home, where are we with that? If in a home no need for me to prescribe If not, then how many tramadol tabs daily does she need to take???

## 2017-08-22 NOTE — Telephone Encounter (Signed)
Patient left message on nurse line, she states that the dr that was prescribing rx Tramadol is no longer in practice. Asks if Dr. Moshe Cipro will rx?

## 2017-08-23 ENCOUNTER — Other Ambulatory Visit: Payer: Self-pay | Admitting: Family Medicine

## 2017-08-23 DIAGNOSIS — I10 Essential (primary) hypertension: Secondary | ICD-10-CM | POA: Diagnosis not present

## 2017-08-23 DIAGNOSIS — E559 Vitamin D deficiency, unspecified: Secondary | ICD-10-CM | POA: Diagnosis not present

## 2017-08-23 LAB — CBC
HCT: 29.5 % — ABNORMAL LOW (ref 35.0–45.0)
Hemoglobin: 9.9 g/dL — ABNORMAL LOW (ref 11.7–15.5)
MCH: 30.7 pg (ref 27.0–33.0)
MCHC: 33.6 g/dL (ref 32.0–36.0)
MCV: 91.6 fL (ref 80.0–100.0)
MPV: 10.6 fL (ref 7.5–12.5)
PLATELETS: 172 10*3/uL (ref 140–400)
RBC: 3.22 MIL/uL — AB (ref 3.80–5.10)
RDW: 13.4 % (ref 11.0–15.0)
WBC: 6.5 10*3/uL (ref 3.8–10.8)

## 2017-08-23 LAB — COMPREHENSIVE METABOLIC PANEL
ALBUMIN: 3.9 g/dL (ref 3.6–5.1)
ALT: 3 U/L — ABNORMAL LOW (ref 6–29)
AST: 19 U/L (ref 10–35)
Alkaline Phosphatase: 76 U/L (ref 33–130)
BILIRUBIN TOTAL: 0.4 mg/dL (ref 0.2–1.2)
BUN: 35 mg/dL — ABNORMAL HIGH (ref 7–25)
CALCIUM: 8.8 mg/dL (ref 8.6–10.4)
CO2: 27 mmol/L (ref 20–32)
Chloride: 104 mmol/L (ref 98–110)
Creat: 0.99 mg/dL — ABNORMAL HIGH (ref 0.60–0.88)
Glucose, Bld: 94 mg/dL (ref 65–99)
Potassium: 4.7 mmol/L (ref 3.5–5.3)
Sodium: 138 mmol/L (ref 135–146)
TOTAL PROTEIN: 6.4 g/dL (ref 6.1–8.1)

## 2017-08-23 LAB — LIPID PANEL
CHOL/HDL RATIO: 2.5 ratio (ref <5.0)
CHOLESTEROL: 147 mg/dL (ref <200)
HDL: 60 mg/dL (ref >50)
LDL Cholesterol: 77 mg/dL (ref <100)
Triglycerides: 51 mg/dL (ref <150)
VLDL: 10 mg/dL (ref <30)

## 2017-08-24 ENCOUNTER — Other Ambulatory Visit: Payer: Self-pay | Admitting: Family Medicine

## 2017-08-24 DIAGNOSIS — D649 Anemia, unspecified: Secondary | ICD-10-CM | POA: Diagnosis not present

## 2017-08-24 DIAGNOSIS — M5030 Other cervical disc degeneration, unspecified cervical region: Secondary | ICD-10-CM | POA: Diagnosis not present

## 2017-08-24 DIAGNOSIS — M5136 Other intervertebral disc degeneration, lumbar region: Secondary | ICD-10-CM | POA: Diagnosis not present

## 2017-08-24 DIAGNOSIS — I2510 Atherosclerotic heart disease of native coronary artery without angina pectoris: Secondary | ICD-10-CM | POA: Diagnosis not present

## 2017-08-24 DIAGNOSIS — G20 Parkinson's disease: Secondary | ICD-10-CM | POA: Diagnosis not present

## 2017-08-24 DIAGNOSIS — R296 Repeated falls: Secondary | ICD-10-CM | POA: Diagnosis not present

## 2017-08-24 DIAGNOSIS — I10 Essential (primary) hypertension: Secondary | ICD-10-CM | POA: Diagnosis not present

## 2017-08-24 DIAGNOSIS — M810 Age-related osteoporosis without current pathological fracture: Secondary | ICD-10-CM | POA: Diagnosis not present

## 2017-08-24 DIAGNOSIS — I739 Peripheral vascular disease, unspecified: Secondary | ICD-10-CM | POA: Diagnosis not present

## 2017-08-24 DIAGNOSIS — M150 Primary generalized (osteo)arthritis: Secondary | ICD-10-CM | POA: Diagnosis not present

## 2017-08-24 LAB — TSH: TSH: 2.77 mIU/L

## 2017-08-24 LAB — VITAMIN D 25 HYDROXY (VIT D DEFICIENCY, FRACTURES): Vit D, 25-Hydroxy: 27 ng/mL — ABNORMAL LOW (ref 30–100)

## 2017-08-24 NOTE — Telephone Encounter (Signed)
[facility]. The rheumatologist was prescribing the tramadol but they are no longer in practice. They are still waiting to hear back from [facility]. Wants some tramadol sent to [facility]. Pt has appt here next week if she isn't placed before then

## 2017-08-24 NOTE — Telephone Encounter (Signed)
Seen [phone] 

## 2017-08-24 NOTE — Telephone Encounter (Signed)
Med prescribed.

## 2017-08-25 LAB — IRON,TIBC AND FERRITIN PANEL
%SAT: 25 % (ref 11–50)
FERRITIN: 46 ng/mL (ref 20–288)
Iron: 86 ug/dL (ref 45–160)
TIBC: 344 ug/dL (ref 250–450)

## 2017-08-25 LAB — B12 AND FOLATE PANEL
Folate: 17.4 ng/mL (ref >5.4)
Vitamin B-12: 439 pg/mL (ref 200–1100)

## 2017-08-30 ENCOUNTER — Ambulatory Visit (INDEPENDENT_AMBULATORY_CARE_PROVIDER_SITE_OTHER): Payer: Medicare HMO | Admitting: Family Medicine

## 2017-08-30 ENCOUNTER — Telehealth: Payer: Self-pay | Admitting: Family Medicine

## 2017-08-30 ENCOUNTER — Encounter: Payer: Self-pay | Admitting: Family Medicine

## 2017-08-30 VITALS — BP 160/72 | HR 76 | Temp 98.8°F | Ht 66.0 in | Wt 141.0 lb

## 2017-08-30 DIAGNOSIS — M170 Bilateral primary osteoarthritis of knee: Secondary | ICD-10-CM | POA: Diagnosis not present

## 2017-08-30 DIAGNOSIS — I10 Essential (primary) hypertension: Secondary | ICD-10-CM | POA: Diagnosis not present

## 2017-08-30 DIAGNOSIS — M150 Primary generalized (osteo)arthritis: Secondary | ICD-10-CM | POA: Diagnosis not present

## 2017-08-30 DIAGNOSIS — R296 Repeated falls: Secondary | ICD-10-CM | POA: Diagnosis not present

## 2017-08-30 DIAGNOSIS — G20 Parkinson's disease: Secondary | ICD-10-CM

## 2017-08-30 DIAGNOSIS — I739 Peripheral vascular disease, unspecified: Secondary | ICD-10-CM | POA: Diagnosis not present

## 2017-08-30 DIAGNOSIS — D649 Anemia, unspecified: Secondary | ICD-10-CM | POA: Diagnosis not present

## 2017-08-30 DIAGNOSIS — R269 Unspecified abnormalities of gait and mobility: Secondary | ICD-10-CM

## 2017-08-30 DIAGNOSIS — M810 Age-related osteoporosis without current pathological fracture: Secondary | ICD-10-CM | POA: Diagnosis not present

## 2017-08-30 DIAGNOSIS — Z23 Encounter for immunization: Secondary | ICD-10-CM | POA: Diagnosis not present

## 2017-08-30 DIAGNOSIS — M5136 Other intervertebral disc degeneration, lumbar region: Secondary | ICD-10-CM | POA: Diagnosis not present

## 2017-08-30 DIAGNOSIS — I2510 Atherosclerotic heart disease of native coronary artery without angina pectoris: Secondary | ICD-10-CM | POA: Diagnosis not present

## 2017-08-30 DIAGNOSIS — M5030 Other cervical disc degeneration, unspecified cervical region: Secondary | ICD-10-CM | POA: Diagnosis not present

## 2017-08-30 NOTE — Telephone Encounter (Signed)
[name] left message on nurse line regarding patient. She states that the patient is to be brought in today and she is going to pick her up and try to get her in. She wants to inform that [facility] has denied coverage for inpatient rehab- she states she believes it is r/t PT's notes.  Callback# [phone]

## 2017-08-30 NOTE — Telephone Encounter (Signed)
noted 

## 2017-08-30 NOTE — Patient Instructions (Addendum)
F/u in mid January, call if you need me sooner  Flu vaccine today  Please start taking ES tylenol 500mg  tabbleet one at bedtime with your tramadol, after 3 days if still having pain at night , take TWO ES tylenol tablets with the one tramadol  I will send a message to tyour nurologist  ( Dr Jannifer Franklin) re stated concerns re restless legs , nightmares, and pain management and please do call in to reschedule so he can address these concerns appropriately   Blood work is excellent, you are encouraged to increase water intake \ Stop additional iron supplement  Please do take vitamin D3 2000 IU OTC one daily   I will refer you for in home PT twice weekly for 6 weeks for bilaterla knee pain and instability

## 2017-09-01 DIAGNOSIS — M810 Age-related osteoporosis without current pathological fracture: Secondary | ICD-10-CM | POA: Diagnosis not present

## 2017-09-01 DIAGNOSIS — M5136 Other intervertebral disc degeneration, lumbar region: Secondary | ICD-10-CM | POA: Diagnosis not present

## 2017-09-01 DIAGNOSIS — I10 Essential (primary) hypertension: Secondary | ICD-10-CM | POA: Diagnosis not present

## 2017-09-01 DIAGNOSIS — D649 Anemia, unspecified: Secondary | ICD-10-CM | POA: Diagnosis not present

## 2017-09-01 DIAGNOSIS — I739 Peripheral vascular disease, unspecified: Secondary | ICD-10-CM | POA: Diagnosis not present

## 2017-09-01 DIAGNOSIS — I2510 Atherosclerotic heart disease of native coronary artery without angina pectoris: Secondary | ICD-10-CM | POA: Diagnosis not present

## 2017-09-01 DIAGNOSIS — G20 Parkinson's disease: Secondary | ICD-10-CM | POA: Diagnosis not present

## 2017-09-01 DIAGNOSIS — M5030 Other cervical disc degeneration, unspecified cervical region: Secondary | ICD-10-CM | POA: Diagnosis not present

## 2017-09-01 DIAGNOSIS — M150 Primary generalized (osteo)arthritis: Secondary | ICD-10-CM | POA: Diagnosis not present

## 2017-09-01 DIAGNOSIS — R296 Repeated falls: Secondary | ICD-10-CM | POA: Diagnosis not present

## 2017-09-04 NOTE — Assessment & Plan Note (Addendum)
Elevated systolic blood pressure ,however , no change is made in medication at this visit Low sodium diet discussed

## 2017-09-04 NOTE — Assessment & Plan Note (Signed)
Home safety discussed and encouraged

## 2017-09-04 NOTE — Progress Notes (Signed)
   Kerry Flynn     MRN: [id number]      DOB: 17-Apr-1927   HPI Ms. Koehne is here for follow up and re-evaluation of chronic medical conditions, medication management and review of any available recent lab and radiology data.  Preventive health is updated, specifically  Cancer screening and Immunization.   Recently fractured her clavicle, is doing well with the recovery,  Attempts to get into an assisted living facility were unsuccessful, and she I is currently still happier in her own home. Now her ,main complaint is  Of uncontrolled knee pain and she and her niece report a lot of pain  At night keeping her awake and have questions regarding pain management There is also concern re timing of her medication for restless legs, the sinemet , as this seems to be early in the evening for the last dose when her symptoms are most pronounced at bedtime during sleep, this will need to be discussed with neurology. She had to cancel her most recent appointment due to fracture and will need to reschedule Pain management is a challenge due to her advanced age and increased drug interaction  , a message will be sent to her neurologist  C/o nightmares and restless legs  ROS Denies recent fever or chills. Denies sinus pressure, nasal congestion, ear pain or sore throat. Denies chest congestion, productive cough or wheezing. Denies chest pains, palpitations and leg swelling Denies abdominal pain, nausea, vomiting,diarrhea or constipation.   Denies dysuria, frequency, hesitancy or incontinence.  Denies headaches, seizures, numbness, or tingling. Denies depression, anxiety , c/o night mares and restless legs Denies skin break down or rash.   PE  BP (!) 160/72 (BP Location: Right Arm, Patient Position: Sitting, Cuff Size: Normal)   Pulse 76   Temp 98.8 F (37.1 C)   Ht 5\' 6"  (1.676 m)   Wt 141 lb (64 kg)   SpO2 96%   BMI 22.76 kg/m   Patient alert and oriented and in no cardiopulmonary  distress.  HEENT: No facial asymmetry, EOMI,   oropharynx pink and moist.  Neck decreased ROM no JVD, no mass.  Chest: Clear to auscultation bilaterally.  CVS: S1, S2 no murmurs, no S3.Regular rate.  ABD: Soft non tender.   Ext: No edema  [id number]  ROM spine, shoulders, hips and knees.  Skin: Intact, no ulcerations or rash noted.  Psych: Good eye contact, normal affect. Memory intact not anxious or depressed appearing.  CNS: CN 2-12 intact, power,  normal throughout.no focal deficits noted.   Assessment & Plan  Essential hypertension Elevated systolic blood pressure ,however , no change is made in medication at this visit Low sodium diet discussed  Parkinson disease ([place]) Managed by neurology, significant debility in terms of mobility and ability to  Live independently,  Currently has 24 hour sitting , still resisting moving to an assisted living facility  Abnormality of gait Home safety discussed and encouraged  Osteoarthritis of both knees Increased and uncontrolled bilateral knee pain. Pt to take ES tylenol in addition to tramadol 50 mg one at bedtime , maytake up to 1000 mg tylenol for pain relief

## 2017-09-04 NOTE — Assessment & Plan Note (Signed)
Managed by neurology, significant debility in terms of mobility and ability to  Live independently,  Currently has 24 hour sitting , still resisting moving to an assisted living facility

## 2017-09-04 NOTE — Assessment & Plan Note (Signed)
Increased and uncontrolled bilateral knee pain. Pt to take ES tylenol in addition to tramadol 50 mg one at bedtime , maytake up to 1000 mg tylenol for pain relief

## 2017-09-05 DIAGNOSIS — M150 Primary generalized (osteo)arthritis: Secondary | ICD-10-CM | POA: Diagnosis not present

## 2017-09-05 DIAGNOSIS — M810 Age-related osteoporosis without current pathological fracture: Secondary | ICD-10-CM | POA: Diagnosis not present

## 2017-09-05 DIAGNOSIS — M5136 Other intervertebral disc degeneration, lumbar region: Secondary | ICD-10-CM | POA: Diagnosis not present

## 2017-09-05 DIAGNOSIS — I739 Peripheral vascular disease, unspecified: Secondary | ICD-10-CM | POA: Diagnosis not present

## 2017-09-05 DIAGNOSIS — I2510 Atherosclerotic heart disease of native coronary artery without angina pectoris: Secondary | ICD-10-CM | POA: Diagnosis not present

## 2017-09-05 DIAGNOSIS — G20 Parkinson's disease: Secondary | ICD-10-CM | POA: Diagnosis not present

## 2017-09-05 DIAGNOSIS — M5030 Other cervical disc degeneration, unspecified cervical region: Secondary | ICD-10-CM | POA: Diagnosis not present

## 2017-09-05 DIAGNOSIS — R296 Repeated falls: Secondary | ICD-10-CM | POA: Diagnosis not present

## 2017-09-05 DIAGNOSIS — D649 Anemia, unspecified: Secondary | ICD-10-CM | POA: Diagnosis not present

## 2017-09-05 DIAGNOSIS — I10 Essential (primary) hypertension: Secondary | ICD-10-CM | POA: Diagnosis not present

## 2017-09-07 DIAGNOSIS — M810 Age-related osteoporosis without current pathological fracture: Secondary | ICD-10-CM | POA: Diagnosis not present

## 2017-09-07 DIAGNOSIS — I739 Peripheral vascular disease, unspecified: Secondary | ICD-10-CM | POA: Diagnosis not present

## 2017-09-07 DIAGNOSIS — M5136 Other intervertebral disc degeneration, lumbar region: Secondary | ICD-10-CM | POA: Diagnosis not present

## 2017-09-07 DIAGNOSIS — I10 Essential (primary) hypertension: Secondary | ICD-10-CM | POA: Diagnosis not present

## 2017-09-07 DIAGNOSIS — M5030 Other cervical disc degeneration, unspecified cervical region: Secondary | ICD-10-CM | POA: Diagnosis not present

## 2017-09-07 DIAGNOSIS — I2510 Atherosclerotic heart disease of native coronary artery without angina pectoris: Secondary | ICD-10-CM | POA: Diagnosis not present

## 2017-09-07 DIAGNOSIS — D649 Anemia, unspecified: Secondary | ICD-10-CM | POA: Diagnosis not present

## 2017-09-07 DIAGNOSIS — G20 Parkinson's disease: Secondary | ICD-10-CM | POA: Diagnosis not present

## 2017-09-07 DIAGNOSIS — M150 Primary generalized (osteo)arthritis: Secondary | ICD-10-CM | POA: Diagnosis not present

## 2017-09-07 DIAGNOSIS — R296 Repeated falls: Secondary | ICD-10-CM | POA: Diagnosis not present

## 2017-09-13 DIAGNOSIS — M810 Age-related osteoporosis without current pathological fracture: Secondary | ICD-10-CM | POA: Diagnosis not present

## 2017-09-13 DIAGNOSIS — M5136 Other intervertebral disc degeneration, lumbar region: Secondary | ICD-10-CM | POA: Diagnosis not present

## 2017-09-13 DIAGNOSIS — I10 Essential (primary) hypertension: Secondary | ICD-10-CM | POA: Diagnosis not present

## 2017-09-13 DIAGNOSIS — I2510 Atherosclerotic heart disease of native coronary artery without angina pectoris: Secondary | ICD-10-CM | POA: Diagnosis not present

## 2017-09-13 DIAGNOSIS — G20 Parkinson's disease: Secondary | ICD-10-CM | POA: Diagnosis not present

## 2017-09-13 DIAGNOSIS — I739 Peripheral vascular disease, unspecified: Secondary | ICD-10-CM | POA: Diagnosis not present

## 2017-09-13 DIAGNOSIS — R296 Repeated falls: Secondary | ICD-10-CM | POA: Diagnosis not present

## 2017-09-13 DIAGNOSIS — M150 Primary generalized (osteo)arthritis: Secondary | ICD-10-CM | POA: Diagnosis not present

## 2017-09-13 DIAGNOSIS — D649 Anemia, unspecified: Secondary | ICD-10-CM | POA: Diagnosis not present

## 2017-09-13 DIAGNOSIS — M5030 Other cervical disc degeneration, unspecified cervical region: Secondary | ICD-10-CM | POA: Diagnosis not present

## 2017-09-15 DIAGNOSIS — R296 Repeated falls: Secondary | ICD-10-CM | POA: Diagnosis not present

## 2017-09-15 DIAGNOSIS — I2510 Atherosclerotic heart disease of native coronary artery without angina pectoris: Secondary | ICD-10-CM | POA: Diagnosis not present

## 2017-09-15 DIAGNOSIS — M5136 Other intervertebral disc degeneration, lumbar region: Secondary | ICD-10-CM | POA: Diagnosis not present

## 2017-09-15 DIAGNOSIS — I10 Essential (primary) hypertension: Secondary | ICD-10-CM | POA: Diagnosis not present

## 2017-09-15 DIAGNOSIS — M5030 Other cervical disc degeneration, unspecified cervical region: Secondary | ICD-10-CM | POA: Diagnosis not present

## 2017-09-15 DIAGNOSIS — M810 Age-related osteoporosis without current pathological fracture: Secondary | ICD-10-CM | POA: Diagnosis not present

## 2017-09-15 DIAGNOSIS — I739 Peripheral vascular disease, unspecified: Secondary | ICD-10-CM | POA: Diagnosis not present

## 2017-09-15 DIAGNOSIS — M150 Primary generalized (osteo)arthritis: Secondary | ICD-10-CM | POA: Diagnosis not present

## 2017-09-15 DIAGNOSIS — D649 Anemia, unspecified: Secondary | ICD-10-CM | POA: Diagnosis not present

## 2017-09-15 DIAGNOSIS — G20 Parkinson's disease: Secondary | ICD-10-CM | POA: Diagnosis not present

## 2017-09-20 DIAGNOSIS — I739 Peripheral vascular disease, unspecified: Secondary | ICD-10-CM | POA: Diagnosis not present

## 2017-09-20 DIAGNOSIS — D649 Anemia, unspecified: Secondary | ICD-10-CM | POA: Diagnosis not present

## 2017-09-20 DIAGNOSIS — M150 Primary generalized (osteo)arthritis: Secondary | ICD-10-CM | POA: Diagnosis not present

## 2017-09-20 DIAGNOSIS — M5030 Other cervical disc degeneration, unspecified cervical region: Secondary | ICD-10-CM | POA: Diagnosis not present

## 2017-09-20 DIAGNOSIS — I2510 Atherosclerotic heart disease of native coronary artery without angina pectoris: Secondary | ICD-10-CM | POA: Diagnosis not present

## 2017-09-20 DIAGNOSIS — I10 Essential (primary) hypertension: Secondary | ICD-10-CM | POA: Diagnosis not present

## 2017-09-20 DIAGNOSIS — G20 Parkinson's disease: Secondary | ICD-10-CM | POA: Diagnosis not present

## 2017-09-20 DIAGNOSIS — M810 Age-related osteoporosis without current pathological fracture: Secondary | ICD-10-CM | POA: Diagnosis not present

## 2017-09-20 DIAGNOSIS — R296 Repeated falls: Secondary | ICD-10-CM | POA: Diagnosis not present

## 2017-09-20 DIAGNOSIS — M5136 Other intervertebral disc degeneration, lumbar region: Secondary | ICD-10-CM | POA: Diagnosis not present

## 2017-09-22 DIAGNOSIS — D649 Anemia, unspecified: Secondary | ICD-10-CM | POA: Diagnosis not present

## 2017-09-22 DIAGNOSIS — M5136 Other intervertebral disc degeneration, lumbar region: Secondary | ICD-10-CM | POA: Diagnosis not present

## 2017-09-22 DIAGNOSIS — G20 Parkinson's disease: Secondary | ICD-10-CM | POA: Diagnosis not present

## 2017-09-22 DIAGNOSIS — M150 Primary generalized (osteo)arthritis: Secondary | ICD-10-CM | POA: Diagnosis not present

## 2017-09-22 DIAGNOSIS — M5030 Other cervical disc degeneration, unspecified cervical region: Secondary | ICD-10-CM | POA: Diagnosis not present

## 2017-09-22 DIAGNOSIS — R296 Repeated falls: Secondary | ICD-10-CM | POA: Diagnosis not present

## 2017-09-22 DIAGNOSIS — M810 Age-related osteoporosis without current pathological fracture: Secondary | ICD-10-CM | POA: Diagnosis not present

## 2017-09-22 DIAGNOSIS — I739 Peripheral vascular disease, unspecified: Secondary | ICD-10-CM | POA: Diagnosis not present

## 2017-09-22 DIAGNOSIS — I2510 Atherosclerotic heart disease of native coronary artery without angina pectoris: Secondary | ICD-10-CM | POA: Diagnosis not present

## 2017-09-22 DIAGNOSIS — I10 Essential (primary) hypertension: Secondary | ICD-10-CM | POA: Diagnosis not present

## 2017-09-26 ENCOUNTER — Other Ambulatory Visit: Payer: Self-pay | Admitting: Family Medicine

## 2017-09-27 DIAGNOSIS — M810 Age-related osteoporosis without current pathological fracture: Secondary | ICD-10-CM | POA: Diagnosis not present

## 2017-09-27 DIAGNOSIS — G20 Parkinson's disease: Secondary | ICD-10-CM | POA: Diagnosis not present

## 2017-09-27 DIAGNOSIS — M5030 Other cervical disc degeneration, unspecified cervical region: Secondary | ICD-10-CM | POA: Diagnosis not present

## 2017-09-27 DIAGNOSIS — M150 Primary generalized (osteo)arthritis: Secondary | ICD-10-CM | POA: Diagnosis not present

## 2017-09-27 DIAGNOSIS — M5136 Other intervertebral disc degeneration, lumbar region: Secondary | ICD-10-CM | POA: Diagnosis not present

## 2017-09-27 DIAGNOSIS — I739 Peripheral vascular disease, unspecified: Secondary | ICD-10-CM | POA: Diagnosis not present

## 2017-09-27 DIAGNOSIS — R296 Repeated falls: Secondary | ICD-10-CM | POA: Diagnosis not present

## 2017-09-27 DIAGNOSIS — D649 Anemia, unspecified: Secondary | ICD-10-CM | POA: Diagnosis not present

## 2017-09-27 DIAGNOSIS — I10 Essential (primary) hypertension: Secondary | ICD-10-CM | POA: Diagnosis not present

## 2017-09-27 DIAGNOSIS — I2510 Atherosclerotic heart disease of native coronary artery without angina pectoris: Secondary | ICD-10-CM | POA: Diagnosis not present

## 2017-09-28 NOTE — Telephone Encounter (Signed)
Issue resolved.

## 2017-09-29 DIAGNOSIS — S42032D Displaced fracture of lateral end of left clavicle, subsequent encounter for fracture with routine healing: Secondary | ICD-10-CM | POA: Diagnosis not present

## 2017-09-29 DIAGNOSIS — M170 Bilateral primary osteoarthritis of knee: Secondary | ICD-10-CM | POA: Diagnosis not present

## 2017-09-29 DIAGNOSIS — M898X1 Other specified disorders of bone, shoulder: Secondary | ICD-10-CM | POA: Diagnosis not present

## 2017-09-30 ENCOUNTER — Telehealth: Payer: Self-pay

## 2017-09-30 ENCOUNTER — Emergency Department (HOSPITAL_COMMUNITY)
Admission: EM | Admit: 2017-09-30 | Discharge: 2017-09-30 | Disposition: A | Discharge status: Skilled Nursing Facility | Payer: Medicare HMO | Attending: Emergency Medicine | Admitting: Emergency Medicine

## 2017-09-30 ENCOUNTER — Emergency Department (HOSPITAL_COMMUNITY): Payer: Medicare HMO

## 2017-09-30 ENCOUNTER — Encounter (HOSPITAL_COMMUNITY): Payer: Self-pay

## 2017-09-30 DIAGNOSIS — N3281 Overactive bladder: Secondary | ICD-10-CM | POA: Diagnosis not present

## 2017-09-30 DIAGNOSIS — W19XXXD Unspecified fall, subsequent encounter: Secondary | ICD-10-CM | POA: Diagnosis not present

## 2017-09-30 DIAGNOSIS — M546 Pain in thoracic spine: Secondary | ICD-10-CM | POA: Diagnosis not present

## 2017-09-30 DIAGNOSIS — S0990XD Unspecified injury of head, subsequent encounter: Secondary | ICD-10-CM | POA: Diagnosis not present

## 2017-09-30 DIAGNOSIS — S199XXA Unspecified injury of neck, initial encounter: Secondary | ICD-10-CM | POA: Diagnosis not present

## 2017-09-30 DIAGNOSIS — R030 Elevated blood-pressure reading, without diagnosis of hypertension: Secondary | ICD-10-CM | POA: Diagnosis not present

## 2017-09-30 DIAGNOSIS — Z87891 Personal history of nicotine dependence: Secondary | ICD-10-CM | POA: Diagnosis not present

## 2017-09-30 DIAGNOSIS — E119 Type 2 diabetes mellitus without complications: Secondary | ICD-10-CM | POA: Diagnosis not present

## 2017-09-30 DIAGNOSIS — Y92019 Unspecified place in single-family (private) house as the place of occurrence of the external cause: Secondary | ICD-10-CM | POA: Diagnosis not present

## 2017-09-30 DIAGNOSIS — R279 Unspecified lack of coordination: Secondary | ICD-10-CM | POA: Diagnosis not present

## 2017-09-30 DIAGNOSIS — S22050A Wedge compression fracture of T5-T6 vertebra, initial encounter for closed fracture: Secondary | ICD-10-CM | POA: Diagnosis not present

## 2017-09-30 DIAGNOSIS — K219 Gastro-esophageal reflux disease without esophagitis: Secondary | ICD-10-CM | POA: Insufficient documentation

## 2017-09-30 DIAGNOSIS — M6281 Muscle weakness (generalized): Secondary | ICD-10-CM | POA: Diagnosis not present

## 2017-09-30 DIAGNOSIS — Y9389 Activity, other specified: Secondary | ICD-10-CM | POA: Insufficient documentation

## 2017-09-30 DIAGNOSIS — W19XXXA Unspecified fall, initial encounter: Secondary | ICD-10-CM | POA: Diagnosis not present

## 2017-09-30 DIAGNOSIS — G20 Parkinson's disease: Secondary | ICD-10-CM | POA: Diagnosis not present

## 2017-09-30 DIAGNOSIS — R51 Headache: Secondary | ICD-10-CM | POA: Diagnosis not present

## 2017-09-30 DIAGNOSIS — H8110 Benign paroxysmal vertigo, unspecified ear: Secondary | ICD-10-CM | POA: Diagnosis not present

## 2017-09-30 DIAGNOSIS — W07XXXA Fall from chair, initial encounter: Secondary | ICD-10-CM | POA: Diagnosis not present

## 2017-09-30 DIAGNOSIS — K5909 Other constipation: Secondary | ICD-10-CM | POA: Diagnosis not present

## 2017-09-30 DIAGNOSIS — R0781 Pleurodynia: Secondary | ICD-10-CM | POA: Diagnosis not present

## 2017-09-30 DIAGNOSIS — I10 Essential (primary) hypertension: Secondary | ICD-10-CM | POA: Diagnosis not present

## 2017-09-30 DIAGNOSIS — G2581 Restless legs syndrome: Secondary | ICD-10-CM | POA: Diagnosis not present

## 2017-09-30 DIAGNOSIS — S2222XD Fracture of body of sternum, subsequent encounter for fracture with routine healing: Secondary | ICD-10-CM | POA: Diagnosis not present

## 2017-09-30 DIAGNOSIS — S0990XA Unspecified injury of head, initial encounter: Secondary | ICD-10-CM | POA: Insufficient documentation

## 2017-09-30 DIAGNOSIS — Y999 Unspecified external cause status: Secondary | ICD-10-CM | POA: Diagnosis not present

## 2017-09-30 DIAGNOSIS — E7849 Other hyperlipidemia: Secondary | ICD-10-CM | POA: Diagnosis not present

## 2017-09-30 DIAGNOSIS — R69 Illness, unspecified: Secondary | ICD-10-CM | POA: Diagnosis not present

## 2017-09-30 DIAGNOSIS — D518 Other vitamin B12 deficiency anemias: Secondary | ICD-10-CM | POA: Diagnosis not present

## 2017-09-30 DIAGNOSIS — S2222XA Fracture of body of sternum, initial encounter for closed fracture: Secondary | ICD-10-CM

## 2017-09-30 DIAGNOSIS — M542 Cervicalgia: Secondary | ICD-10-CM | POA: Diagnosis not present

## 2017-09-30 DIAGNOSIS — Z79899 Other long term (current) drug therapy: Secondary | ICD-10-CM | POA: Diagnosis not present

## 2017-09-30 DIAGNOSIS — R079 Chest pain, unspecified: Secondary | ICD-10-CM | POA: Diagnosis present

## 2017-09-30 DIAGNOSIS — S299XXA Unspecified injury of thorax, initial encounter: Secondary | ICD-10-CM | POA: Diagnosis not present

## 2017-09-30 DIAGNOSIS — R112 Nausea with vomiting, unspecified: Secondary | ICD-10-CM | POA: Diagnosis not present

## 2017-09-30 DIAGNOSIS — Z7401 Bed confinement status: Secondary | ICD-10-CM | POA: Diagnosis not present

## 2017-09-30 HISTORY — DX: Personal history of (healed) traumatic fracture: Z87.81

## 2017-09-30 LAB — BASIC METABOLIC PANEL
Anion gap: 8 (ref 5–15)
BUN: 27 mg/dL — AB (ref 6–20)
CALCIUM: 8.9 mg/dL (ref 8.9–10.3)
CO2: 25 mmol/L (ref 22–32)
Chloride: 105 mmol/L (ref 101–111)
Creatinine, Ser: 0.76 mg/dL (ref 0.44–1.00)
GFR calc Af Amer: >60 mL/min (ref >60)
GLUCOSE: 98 mg/dL (ref 65–99)
Potassium: 3.5 mmol/L (ref 3.5–5.1)
SODIUM: 138 mmol/L (ref 135–145)

## 2017-09-30 LAB — URINALYSIS, ROUTINE W REFLEX MICROSCOPIC
BILIRUBIN URINE: NEGATIVE
Glucose, UA: NEGATIVE mg/dL
Hgb urine dipstick: NEGATIVE
KETONES UR: NEGATIVE mg/dL
Leukocytes, UA: NEGATIVE
NITRITE: NEGATIVE
PH: 7 (ref 5.0–8.0)
PROTEIN: NEGATIVE mg/dL
Specific Gravity, Urine: 1.015 (ref 1.005–1.030)

## 2017-09-30 LAB — CBC WITH DIFFERENTIAL/PLATELET
BASOS PCT: 0 %
Basophils Absolute: 0 10*3/uL (ref 0.0–0.1)
EOS ABS: 0.3 10*3/uL (ref 0.0–0.7)
EOS PCT: 2 %
HEMATOCRIT: 30 % — AB (ref 36.0–46.0)
Hemoglobin: 9.8 g/dL — ABNORMAL LOW (ref 12.0–15.0)
Lymphocytes Relative: 5 %
Lymphs Abs: 0.6 10*3/uL — ABNORMAL LOW (ref 0.7–4.0)
MCH: 30.8 pg (ref 26.0–34.0)
MCHC: 32.7 g/dL (ref 30.0–36.0)
MCV: 94.3 fL (ref 78.0–100.0)
MONO ABS: 1 10*3/uL (ref 0.1–1.0)
MONOS PCT: 9 %
NEUTROS ABS: 9.2 10*3/uL — AB (ref 1.7–7.7)
Neutrophils Relative %: 84 %
PLATELETS: 139 10*3/uL — AB (ref 150–400)
RBC: 3.18 MIL/uL — ABNORMAL LOW (ref 3.87–5.11)
RDW: 13.1 % (ref 11.5–15.5)
WBC: 11 10*3/uL — ABNORMAL HIGH (ref 4.0–10.5)

## 2017-09-30 MED ORDER — TRAMADOL HCL 50 MG PO TABS: 50.0000 mg | ORAL_TABLET | Freq: Three times a day (TID) | ORAL | 0 refills | Status: AC | PRN

## 2017-09-30 MED ORDER — ACETAMINOPHEN 500 MG PO TABS
1000.0000 mg | ORAL_TABLET | Freq: Once | ORAL | Status: AC
  Administered 2017-09-30: 1000 mg via ORAL
  Filled 2017-09-30: qty 2

## 2017-09-30 NOTE — ED Triage Notes (Addendum)
Pt brought in by EMS due to a fall. Pt reports that she was bending over to get into left chair and loss balance and fell back into a counter. Pt reports pain in sternum. No loss of consciousness. Pt reports she broke her left collarbone a month ago from a fall. Pt lives alone, but has 3 sitters that help care for her

## 2017-09-30 NOTE — ED Notes (Signed)
Awaiting bed assignment  hospitalist

## 2017-09-30 NOTE — Clinical Social Work Placement (Signed)
   CLINICAL SOCIAL WORK PLACEMENT  NOTE  Date:  09/30/2017  Patient Details  Name: Kerry Flynn MRN: [id number] Date of Birth: 09/14/1927  Clinical Social Work is seeking post-discharge placement for this patient at the [place] level of care (*CSW will initial, date and re-position this form in  chart as items are completed):  Yes   Patient/family provided with [place] Work Department's list of facilities offering this level of care within the geographic area requested by the patient (or if unable, by the patient's family).  Yes   Patient/family informed of their freedom to choose among providers that offer the needed level of care, that participate in Medicare, Medicaid or managed care program needed by the patient, have an available bed and are willing to accept the patient.  Yes   Patient/family informed of [facility]'s ownership interest in [facility] and [facility], as well as of the fact that they are under no obligation to receive care at these facilities.  PASRR submitted to EDS on 09/30/17     PASRR number received on 09/30/17     Existing PASRR number confirmed on       FL2 transmitted to all facilities in geographic area requested by pt/family on 09/30/17     FL2 transmitted to all facilities within larger geographic area on       Patient informed that his/her managed care company has contracts with or will negotiate with certain facilities, including the following:        Yes   Patient/family informed of bed offers received.  Patient [facility] bed at [facility]     Physician recommends and patient chooses bed at      Patient to be transferred to [facility] on 09/30/17.  Patient to be transferred to facility by [facility]     Patient family notified on 09/30/17 of transfer.  Name of family member notified:  Verdene Rio, neice      PHYSICIAN       Additional Comment:     _______________________________________________ Ihor Gully, LCSW 09/30/2017, 4:19 PM

## 2017-09-30 NOTE — ED Notes (Signed)
hospitalist has seen    Per Dr Laverta Baltimore, social work is to see and to try to place today

## 2017-09-30 NOTE — ED Notes (Signed)
Report to [facility]

## 2017-09-30 NOTE — ED Provider Notes (Signed)
Pt seen by CM/ SW - has been placed at the [facility] for d/c Refilled 7 days of tramadol   Noemi Chapel, MD 09/30/17 [phone]

## 2017-09-30 NOTE — ED Notes (Signed)
Continues to await EMS trans port

## 2017-09-30 NOTE — Discharge Instructions (Signed)
You have a fractured sternum - this is your breast bone You WILL have increased pain with moving or breathing Take tramadol 3 times a day as needed ER for severe pain / difficulty breathing.

## 2017-09-30 NOTE — ED Notes (Signed)
Awaiting bed assignment.

## 2017-09-30 NOTE — ED Notes (Signed)
Meal provided 

## 2017-09-30 NOTE — ED Notes (Signed)
Awaiting placement/dispos

## 2017-09-30 NOTE — ED Notes (Signed)
Jeneen Rinks, PT at bedside   Awaiting SW and placement

## 2017-09-30 NOTE — Clinical Social Work Note (Signed)
Patient from home with 24/7 caregivers. Her niece/POA, Verdene Rio is her primary caregiver.  Patient is agreeable to SNF. Would like to go to SNF in [place], but now beds are currently available at desired facilities. Patient accepts [facility] private pay while facility receives authorization.   Yeiren [place], Clydene Pugh, LCSW

## 2017-09-30 NOTE — NC FL2 (Signed)
[place] LEVEL OF CARE SCREENING TOOL     IDENTIFICATION  Patient Name: Kerry Flynn Birthdate: 1927-10-22 Sex: female Admission Date (Current Location): 09/30/2017  [facility] and [place] Number:  [employer] and Address:  [place] [address], [place]      Provider Number: [id number]  Attending Physician Name and Address:  Margette Fast, MD  Relative Name and Phone Number:       Current Level of Care: Other (Comment) (Patient is in Emergency Department.) Recommended Level of Care: [place] Prior Approval Number:    Date Approved/Denied:   PASRR Number: [id number] ([id number])  Discharge Plan: SNF    Current Diagnoses: Patient Active Problem List   Diagnosis Date Noted  . Toe ulcer ([place]) 05/02/2017  . PVD (peripheral vascular disease) ([place]) 10/18/2016  . Nocturnal leg cramps 09/29/2016  . Abnormality of gait 11/17/2015  . Parkinson disease ([place]) 11/17/2015  . 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 Hearing loss in left ear 10/15/2015  . Osteoarthritis of both knees 08/27/2015  . Tremor 08/27/2015  . IDA (iron deficiency anemia) 01/31/2015  . Multiple thyroid nodules 04/04/2014  . GERD (gastroesophageal reflux disease) 04/04/2014  . Postmenopausal atrophic vaginitis 03/12/2014  . Vitamin D deficiency 03/12/2014  . Osteopetrosis 10/23/2013  . Urinary incontinence, urge 10/23/2013  . Colon polyps 02/27/2012  . CAD, NATIVE VESSEL 12/12/2009  . Osteoarthritis, generalized 04/19/2009  . Essential hypertension 10/16/2008  . HEMORRHOIDS 03/29/2008  . INTERMITTENT VERTIGO 03/29/2008    Orientation RESPIRATION BLADDER Height & Weight     Self, Time, Situation, Place  Normal Incontinent Weight: 145 lb (65.8 kg) Height:  5\' 6"  (167.6 cm)  BEHAVIORAL SYMPTOMS/MOOD NEUROLOGICAL BOWEL NUTRITION STATUS      Continent Diet (Regular)  AMBULATORY STATUS COMMUNICATION OF NEEDS Skin   Limited Assist  Verbally Normal                       Personal Care Assistance Level of Assistance  Bathing, Feeding, Dressing Bathing Assistance: Limited assistance Feeding assistance: Independent Dressing Assistance: Limited assistance     Functional Limitations Info  Sight, Hearing, Speech Sight Info: Adequate Hearing Info: Adequate Speech Info: Adequate    SPECIAL CARE FACTORS FREQUENCY  PT (By licensed PT)     PT Frequency: 5x/week              Contractures Contractures Info: Not present    Additional Factors Info  Allergies, Code Status Code Status Info: Full Allergies Info: Penicillins, Clarithromycin, Flagyl           Current Medications (09/30/2017):  This is the current hospital active medication list No current facility-administered medications for this encounter.    Current Outpatient Prescriptions  Medication Sig Dispense Refill  . acetaminophen (TYLENOL) 500 MG tablet Take 500 mg by mouth every 6 (six) hours as needed for mild pain or moderate pain.     Marland Kitchen amLODipine (NORVASC) 5 MG tablet TAKE ONE TABLET BY MOUTH ONCE DAILY 90 tablet 1  . Calcium-Vitamin D-Vitamin K 500-100-40 MG-UNT-MCG CHEW Chew 1 tablet by mouth 2 (two) times daily.     . carbidopa-levodopa (SINEMET IR) 25-100 MG tablet Take 1 tablet by mouth 3 (three) times daily. 270 tablet 1  . docusate sodium (COLACE) 100 MG capsule Take 100 mg by mouth daily.     . Magnesium Oxide (PHILLIPS) 500 MG (LAX) TABS Take 1 tablet (500 mg total) by mouth daily  as needed.  0  . meloxicam (MOBIC) 15 MG tablet TAKE 1 TABLET BY MOUTH DAILY 90 tablet 1  . MYRBETRIQ 50 MG TB24 tablet TAKE 1 TABLET (50 MG TOTAL) BY MOUTH DAILY. 90 tablet 1  . nitroGLYCERIN (NITROSTAT) 0.4 MG SL tablet Place 0.4 mg under the tongue every 5 (five) minutes as needed. 1 tablet under the tongue at onset of chest pain: you may repeat every 5 minutes for up to 3 doses    . Probiotic Product (HEALTHY COLON) CAPS Take 1 capsule by mouth daily.      . Turmeric Curcumin 500 MG CAPS Take 1 capsule by mouth daily.    . traMADol (ULTRAM) 50 MG tablet TAKE ONE TABLET BY MOUTH THREE TIMES DAILY AS NEEDED (Patient not taking: Reported on 09/30/2017) 90 tablet 0     Discharge Medications: Please see discharge summary for a list of discharge medications.  Relevant Imaging Results:  Relevant Lab Results:   Additional Information SSN [id number] Clydene Pugh, LCSW

## 2017-09-30 NOTE — ED Notes (Signed)
Awaiting EMS transfer to [place]

## 2017-09-30 NOTE — ED Provider Notes (Signed)
Emergency Department Provider Note   I have reviewed the triage vital signs and the nursing notes.   HISTORY  Chief Complaint Fall   HPI ROCHELLE LARUE is a 83 y.o. female with PMH of parkinsons, DDD, GERD, and HTN presents to the emergency department for evaluation after a mechanical fall at home. Patient lives at home but has 3 different health aids to assist her 24/7. This morning she was attempting to get into a lift chair when she lost her balance and fell backwards. She hit her mid back on the bathroom counter and at that time began having pain in the front of her chest. She denies any chest pain, difficulty breathing, palpitations prior to the fall. Denies any pain in the arms or legs. She denies head trauma. No tingling or numbness in the arms or legs. Pain is moderate, constant, and worse with movement.   Past Medical History:  Diagnosis Date  . Abnormality of gait 11/17/2015  . Anemia   . Barrett's esophagus    Dr. Laural Golden, Dr. Moshe Cipro  . Benign recurrent vertigo   . Chronic constipation   . Chronic knee pain   . Colon polyps   . DDD (degenerative disc disease), cervical   . DDD (degenerative disc disease), lumbar   . Dermatitis    recurrent  . Diverticulosis   . 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 Fatigue    chronic  . GERD (gastroesophageal reflux disease)   . Head trauma    status post fall; upper and lower extremities   . Hemorrhoid   . Hemorrhoids   . Hip fracture, left ([place])    s/p left arthroplasty 2/16  . History of broken collarbone   . Hypertension   . Infertility, female   . Intermittent vertigo   . Osteoarthritis    of the neck  . Osteoporosis    knees  . Parkinson disease ([place]) 11/17/2015  . Peripheral edema    chronic  . Stable angina ([facility])   . Varicose veins   . Vitamin D deficiency     Patient Active Problem List   Diagnosis Date Noted  . Toe ulcer ([place]) 05/02/2017  . PVD (peripheral vascular disease) ([place]) 10/18/2016  .  Nocturnal leg cramps 09/29/2016  . Abnormality of gait 11/17/2015  . Parkinson disease ([place]) 11/17/2015  . 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 Hearing loss in left ear 10/15/2015  . Osteoarthritis of both knees 08/27/2015  . Tremor 08/27/2015  . IDA (iron deficiency anemia) 01/31/2015  . Multiple thyroid nodules 04/04/2014  . GERD (gastroesophageal reflux disease) 04/04/2014  . Postmenopausal atrophic vaginitis 03/12/2014  . Vitamin D deficiency 03/12/2014  . Osteopetrosis 10/23/2013  . Urinary incontinence, urge 10/23/2013  . Colon polyps 02/27/2012  . CAD, NATIVE VESSEL 12/12/2009  . Osteoarthritis, generalized 04/19/2009  . Essential hypertension 10/16/2008  . HEMORRHOIDS 03/29/2008  . INTERMITTENT VERTIGO 03/29/2008    Past Surgical History:  Procedure Laterality Date  . CARDIAC CATHETERIZATION  09/02/2005   mild-mod calcification in mid LAD (20-30% luminal obstruction), otherwise normal coronaries, patent LE arteries (Dr. Jackie Plum)  . CAROTID DOPPLER  02/2008   R & L ICAs 0-49% diameter reduction  . CATARACT EXTRACTION W/ INTRAOCULAR LENS IMPLANT Left 01/06/2012   left, [place]  . CHOLECYSTECTOMY    . CHOLECYSTECTOMY, LAPAROSCOPIC  06/17/2006   with stones  . COLONOSCOPY  04/19/2012   Procedure: COLONOSCOPY;  Surgeon: Rogene Houston, MD;  Location: [facility];  Service: Endoscopy;  Laterality: N/A;  1200  . COLONOSCOPY N/A 09/25/2015   Procedure: COLONOSCOPY;  Surgeon: Rogene Houston, MD;  Location: [facility];  Service: Endoscopy;  Laterality: N/A;  100  . COLONOSCOPY W/ POLYPECTOMY  05/29/1999, 01/12/2012  . dental implant   09/2014  . ESOPHAGOGASTRODUODENOSCOPY  01/12/2012  . EYE SURGERY  01/24/2012   bilateral cataract extraction  . HIP ARTHROPLASTY Left 01/31/2015   Procedure: ARTHROPLASTY BIPOLAR HIP;  Surgeon: Carole Civil, MD;  Location: [facility];  Service: Orthopedics;  Laterality: Left;  . KNEE ARTHROSCOPY Right 1999  . KNEE ARTHROSCOPY Left 1996  . NM  MYOCAR PERF WALL MOTION  02/2012   lexiscan - small, fized apical lateral bowel attenuation artifact; no reversible ischemia; EF 77%; non-diagnostic for ischemia; low risk  . PALATE SURGERY  1988  . ROTATOR CUFF REPAIR  05/14/2010  . TONSILLECTOMY    . TONSILLECTOMY AND ADENOIDECTOMY  1934  . TRANSTHORACIC ECHOCARDIOGRAM  01/2012   EF=>55%; mild mitral annular calcification & mild MR; trace TR; trace pulm valve regurg  . UPPER GI ENDOSCOPY  12/12/2006   with biopsy, with colonoscopy - dx: Barrett's disease    Current Outpatient Rx  . Order #: [id number] Class: Historical Med  . Order #: [id number] Class: Normal  . Order #: [id number] Class: Historical Med  . Order #: [id number] Class: Normal  . Order #: [id number] Class: Historical Med  . Order #: [id number] Class: No Print  . Order #: [id number] Class: Normal  . Order #: [id number] Class: Normal  . Order #: [id number] Class: Historical Med  . Order #: [id number] Class: Historical Med  . Order #: [id number] Class: Historical Med  . Order #: [id number] Class: Print    Allergies Penicillins; Clarithromycin; and Flagyl [metronidazole]  Family History  Problem Relation Age of Onset  . Diabetes Mother   . Hypertension Mother   . CVA Mother   . Kidney disease Mother   . Heart failure Mother   . Arthritis Mother   . Cancer Father        larengeal  . Heart disease Father   . Congestive Heart Failure Father   . Bradycardia Father   . Hypotension Father   . Parkinson's disease Sister   . Osteoporosis Sister   . Stroke Maternal Grandmother   . Stroke Maternal Grandfather   . Colon cancer Neg Hx     Social History Social History  Substance Use Topics  . Smoking status: Former Smoker    Packs/day: 0.25    Years: 0.50    Quit date: 12/27/1958  . Smokeless tobacco: Never Used     Comment: only smoked for a few months  . Alcohol use No    Review of Systems  Constitutional: No fever/chills Eyes: No visual changes. ENT: No sore throat. Cardiovascular:  Positive chest pain.  Respiratory: Denies shortness of breath. Gastrointestinal: No abdominal pain.  No nausea, no vomiting.  No diarrhea.  No constipation. Genitourinary: Negative for dysuria. Musculoskeletal: Positive upper back and neck pain.  Skin: Negative for rash. Neurological: Negative for headaches, focal weakness or numbness.  10-point ROS otherwise negative.  ____________________________________________   PHYSICAL EXAM:  VITAL SIGNS: ED Triage Vitals  Enc Vitals Group     BP 09/30/17 0736 (!) 171/64     Pulse Rate 09/30/17 0736 73     Resp 09/30/17 0736 16     Temp 09/30/17 0736 98.8 F (37.1 C)     Temp Source 09/30/17 0736 Oral     SpO2 09/30/17 0736 97 %  Weight 09/30/17 0731 145 lb (65.8 kg)     Height 09/30/17 0731 5\' 6"  (1.676 m)     Pain Score 09/30/17 0730 5   Constitutional: Alert and oriented. Frail-appearing but able to provide full history.  Eyes: Conjunctivae are normal. PERRL.  Head: Atraumatic. Nose: No congestion/rhinnorhea. Mouth/Throat: Mucous membranes are moist.  Oropharynx non-erythematous. Neck: No stridor. No cervical spine tenderness to palpation. Cardiovascular: Normal rate, regular rhythm. Good peripheral circulation. Grossly normal heart sounds.   Respiratory: Normal respiratory effort.  No retractions. Lungs CTAB. Gastrointestinal: Soft and nontender. No distention. :  Musculoskeletal: No lower extremity tenderness nor edema. Full ROM of bilateral hips and knees. No gross deformities of extremities. Mild pain with ROM of the left shoulder. No bony tenderness or deformity. Significant pain to palpation over the sternum.  Neurologic:  Normal speech and language. No gross focal neurologic deficits are appreciated.  Skin:  Skin is warm, dry and intact. No rash noted.  ____________________________________________  RADIOLOGY  Dg Chest 2 View  Result Date: 09/30/2017 CLINICAL DATA:  Sternal pain after falling today. EXAM: CHEST  2  VIEW COMPARISON:  04/04/2014.  01/30/2015. FINDINGS: Heart size is normal. There is atherosclerosis and tortuosity of the aorta. The lungs show hyperinflation and mild scarring. Calcified granuloma versus costal cartilage calcification left mid chest. No evidence of acute rib fracture. Some deformity of the right third and fourth ribs look like old healed fractures. On the lateral view, sternal detail is somewhat limited, but there does appear to be sternal fracture. There may also be lucency within the body of the sternum. Consider chest CT to evaluate for underlying sternal lesion. IMPRESSION: No active cardiopulmonary disease. There does appear to be sternal malalignment consistent with sternal fracture. Question lytic change of the sternum. Consider chest CT. Electronically Signed   By: Nelson Chimes M.D.   On: 09/30/2017 08:38   Ct Head Wo Contrast  Result Date: 09/30/2017 CLINICAL DATA:  Pain following fall EXAM: CT HEAD WITHOUT CONTRAST CT CERVICAL SPINE WITHOUT CONTRAST TECHNIQUE: Multidetector CT imaging of the head and cervical spine was performed following the standard protocol without intravenous contrast. Multiplanar CT image reconstructions of the cervical spine were also generated. COMPARISON:  CT head and CT cervical spine August 05, 2017 FINDINGS: CT HEAD FINDINGS Brain: There is moderate diffuse atrophy. There is no intracranial mass, hemorrhage, extra-axial fluid collection, or midline shift. There is patchy small vessel disease in the centra semiovale bilaterally. Elsewhere gray-white compartments appear normal. No evident acute infarct. Vascular: No hyperdense vessel. There is calcification in both carotid siphon regions. Skull: The bony calvarium appears intact. Sinuses/Orbits: There is mucosal thickening in multiple ethmoid air cells bilaterally with opacification in portions of several ethmoid air cells. Other visualized paranasal sinuses are clear. Orbits appear symmetric bilaterally.  There is apparent scleral banding bilaterally. Other: Mastoid air cells are clear. There is debris in the left external auditory canal. There is an apparent sebaceous cyst overlying the left frontal bone measuring 6 x 5 mm. CT CERVICAL SPINE FINDINGS Alignment: There is 2 mm of anterolisthesis of C3 on C4. There is 1 mm of anterolisthesis of C4 on C5. No other spondylolisthesis evident. Skull base and vertebrae: Skull base and craniocervical junction regions appear normal. No fracture is evident. There are no blastic or lytic bone lesions. Bones are osteoporotic. Soft tissues and spinal canal: Prevertebral soft tissues and predental space regions are normal. There is no paraspinous lesion. No cord or canal hematoma evident. Disc levels:  There is mild disc space narrowing at C4-5, C5-6, and C6-7. There is facet hypertrophy at multiple levels. There is no evident disc extrusion or stenosis. Upper chest: Lung apices are clear. Other: Multinodular goiter is present, stable. Foci of carotid artery calcification bilaterally. IMPRESSION: CT head: Moderate atrophy with small vessel disease in the periventricular white matter, stable. No intracranial mass hemorrhage, or extra-axial fluid collection. No evident acute infarct. Foci of arterial vascular calcification noted. Areas of paranasal sinus disease noted. Probable cerumen noted in the left external auditory canal. Small sebaceous cyst overlying the left frontal bone. CT cervical spine: No demonstrable fracture. Areas of slight spondylolisthesis at C3-4 and C4-5 are felt to be secondary to spondylosis and appear unchanged. There is multilevel osteoarthritic change. There is multinodular goiter without dominant mass, stable. There are foci of carotid artery calcification. Electronically Signed   By: Lowella Grip III M.D.   On: 09/30/2017 09:05   Ct Chest Wo Contrast  Result Date: 09/30/2017 CLINICAL DATA:  Pain following fall EXAM: CT CHEST WITHOUT CONTRAST  TECHNIQUE: Multidetector CT imaging of the chest was performed following the standard protocol without IV contrast. COMPARISON:  Chest radiograph September 30, 2017 FINDINGS: Cardiovascular: There is no thoracic aortic aneurysm. Visualized great vessels appear unremarkable except for moderate atherosclerotic calcification in the proximal left subclavian artery. There is atherosclerotic calcification in the aorta. There are foci of coronary artery calcification. Pericardium is not appreciably thickened. There is no periaortic fluid on this noncontrast enhanced study. Mediastinum/Nodes: Thyroid appears unremarkable. There are subcentimeter mediastinal lymph nodes. There is no adenopathy evident in the thoracic region. No esophageal lesions are evident. Lungs/Pleura: There is no evident pneumothorax or evidence of parenchymal lung contusion. There is patchy atelectatic change in the lung bases. There is no frank edema or consolidation. There is no appreciable pleural effusion or pleural thickening. Trachea appears unremarkable. Upper Abdomen: There is atherosclerotic calcification in the aorta. There is an apparent left adrenal adenoma measuring 2.1 x 1.7 cm. Visualized upper abdominal structures otherwise appear unremarkable on this noncontrast enhanced study. Musculoskeletal: There is evidence of a a fracture of the proximal sternum, age uncertain. There is no associated hematoma. There is no lytic or destructive lesion involving sternum. There is degenerative change in the thoracic spine. No fracture evident elsewhere. No blastic or lytic bone lesions. No chest wall hematoma evident. IMPRESSION: 1. Age uncertain fracture of the proximal sternum. No destructive lesion involving the sternum seen. No fracture seen elsewhere. No blastic or lytic bone lesions. 2. Bibasilar atelectasis. No pneumothorax or parenchymal lung contusion. No edema or consolidation. 3.  No evident adenopathy. 4. Aortic atherosclerosis. Foci of  coronary artery calcification as well as calcification in the proximal left subclavian artery. No periaortic fluid. Aortic Atherosclerosis (ICD10-I70.0). Electronically Signed   By: Lowella Grip III M.D.   On: 09/30/2017 10:08   Ct Cervical Spine Wo Contrast  Result Date: 09/30/2017 CLINICAL DATA:  Pain following fall EXAM: CT HEAD WITHOUT CONTRAST CT CERVICAL SPINE WITHOUT CONTRAST TECHNIQUE: Multidetector CT imaging of the head and cervical spine was performed following the standard protocol without intravenous contrast. Multiplanar CT image reconstructions of the cervical spine were also generated. COMPARISON:  CT head and CT cervical spine August 05, 2017 FINDINGS: CT HEAD FINDINGS Brain: There is moderate diffuse atrophy. There is no intracranial mass, hemorrhage, extra-axial fluid collection, or midline shift. There is patchy small vessel disease in the centra semiovale bilaterally. Elsewhere gray-white compartments appear normal. No evident acute infarct. Vascular:  No hyperdense vessel. There is calcification in both carotid siphon regions. Skull: The bony calvarium appears intact. Sinuses/Orbits: There is mucosal thickening in multiple ethmoid air cells bilaterally with opacification in portions of several ethmoid air cells. Other visualized paranasal sinuses are clear. Orbits appear symmetric bilaterally. There is apparent scleral banding bilaterally. Other: Mastoid air cells are clear. There is debris in the left external auditory canal. There is an apparent sebaceous cyst overlying the left frontal bone measuring 6 x 5 mm. CT CERVICAL SPINE FINDINGS Alignment: There is 2 mm of anterolisthesis of C3 on C4. There is 1 mm of anterolisthesis of C4 on C5. No other spondylolisthesis evident. Skull base and vertebrae: Skull base and craniocervical junction regions appear normal. No fracture is evident. There are no blastic or lytic bone lesions. Bones are osteoporotic. Soft tissues and spinal canal:  Prevertebral soft tissues and predental space regions are normal. There is no paraspinous lesion. No cord or canal hematoma evident. Disc levels: There is mild disc space narrowing at C4-5, C5-6, and C6-7. There is facet hypertrophy at multiple levels. There is no evident disc extrusion or stenosis. Upper chest: Lung apices are clear. Other: Multinodular goiter is present, stable. Foci of carotid artery calcification bilaterally. IMPRESSION: CT head: Moderate atrophy with small vessel disease in the periventricular white matter, stable. No intracranial mass hemorrhage, or extra-axial fluid collection. No evident acute infarct. Foci of arterial vascular calcification noted. Areas of paranasal sinus disease noted. Probable cerumen noted in the left external auditory canal. Small sebaceous cyst overlying the left frontal bone. CT cervical spine: No demonstrable fracture. Areas of slight spondylolisthesis at C3-4 and C4-5 are felt to be secondary to spondylosis and appear unchanged. There is multilevel osteoarthritic change. There is multinodular goiter without dominant mass, stable. There are foci of carotid artery calcification. Electronically Signed   By: Lowella Grip III M.D.   On: 09/30/2017 09:05   Ct Thoracic Spine Wo Contrast  Result Date: 09/30/2017 CLINICAL DATA:  Fall.  Sternal pain. Initial encounter. EXAM: CT THORACIC SPINE WITHOUT CONTRAST TECHNIQUE: Multidetector CT images of the thoracic were obtained using the standard protocol without intravenous contrast. COMPARISON:  None. FINDINGS: Alignment: Exaggerated kyphosis.  No listhesis. Vertebrae: T6 compression fracture that has a chronic appearance and mild depression. No retropulsion. No acute fracture noted. No evidence of bone lesion. Remote lateral right third and fourth rib fractures. Paraspinal and other soft tissues: No evidence of injury. 19 mm left adrenal nodule with densitometry consistent with adenoma. Probable dermal inclusion cyst  in the left low back. Disc levels: Generalized spondylosis and disc narrowing, age congruent. No evidence of cord impingement. IMPRESSION: T6 compression fracture with chronic appearance; height loss is mild. No evidence of acute thoracic injury. Electronically Signed   By: Monte Fantasia M.D.   On: 09/30/2017 09:04   Ct No Charge  Result Date: 09/30/2017 CLINICAL DATA:  Pain following fall EXAM: CT CHEST WITHOUT CONTRAST TECHNIQUE: Multidetector CT imaging of the chest was performed following the standard protocol without IV contrast. COMPARISON:  Chest radiograph September 30, 2017 FINDINGS: Cardiovascular: There is no thoracic aortic aneurysm. Visualized great vessels appear unremarkable except for moderate atherosclerotic calcification in the proximal left subclavian artery. There is atherosclerotic calcification in the aorta. There are foci of coronary artery calcification. Pericardium is not appreciably thickened. There is no periaortic fluid on this noncontrast enhanced study. Mediastinum/Nodes: Thyroid appears unremarkable. There are subcentimeter mediastinal lymph nodes. There is no adenopathy evident in the thoracic region. No  esophageal lesions are evident. Lungs/Pleura: There is no evident pneumothorax or evidence of parenchymal lung contusion. There is patchy atelectatic change in the lung bases. There is no frank edema or consolidation. There is no appreciable pleural effusion or pleural thickening. Trachea appears unremarkable. Upper Abdomen: There is atherosclerotic calcification in the aorta. There is an apparent left adrenal adenoma measuring 2.1 x 1.7 cm. Visualized upper abdominal structures otherwise appear unremarkable on this noncontrast enhanced study. Musculoskeletal: There is evidence of a a fracture of the proximal sternum, age uncertain. There is no associated hematoma. There is no lytic or destructive lesion involving sternum. There is degenerative change in the thoracic spine. No  fracture evident elsewhere. No blastic or lytic bone lesions. No chest wall hematoma evident. IMPRESSION: 1. Age uncertain fracture of the proximal sternum. No destructive lesion involving the sternum seen. No fracture seen elsewhere. No blastic or lytic bone lesions. 2. Bibasilar atelectasis. No pneumothorax or parenchymal lung contusion. No edema or consolidation. 3.  No evident adenopathy. 4. Aortic atherosclerosis. Foci of coronary artery calcification as well as calcification in the proximal left subclavian artery. No periaortic fluid. Aortic Atherosclerosis (ICD10-I70.0). Electronically Signed   By: Lowella Grip III M.D.   On: 09/30/2017 10:08    ____________________________________________   PROCEDURES  Procedure(s) performed:   Procedures  None ____________________________________________   INITIAL IMPRESSION / ASSESSMENT AND PLAN / ED COURSE  Pertinent labs & imaging results that were available during my care of the patient were reviewed by me and considered in my medical decision making (see chart for details).  Patient is thin and frail-appearing. She's had several recent falls including recent fall that resulted in left clavicle fracture. She has home health aides at home and feels that she's doing well there. Today's fall appears to have been mechanical. She struck her back on a nearby counter and is complaining primarily of sternum pain. Also with some tenderness to palpation of the lower cervical and upper thoracic spine. No step-off or deformity. Full range of motion of all joints in the upper and lower extremities including hips. No abdominal discomfort. No bruising over the back or chest wall. Plan for CT imaging of the head, neck, thoracic spine along with chest x-ray.   09:15 AM CXR concerning for sternal fx. Will order CT chest per radiology recommendation. Imaging of the head, neck and thoracic spine with no acute findings.   Hospitalist Dr. Jerilee Hoh has seen the  patient and spoken with PT and CM. CM reports patient can be placed this afternoon to SNF for PT and pain control. Hospitalist will leave a consult note.  ____________________________________________  FINAL CLINICAL IMPRESSION(S) / ED DIAGNOSES  Final diagnoses:  Fall, initial encounter  Fracture of body of sternum, initial encounter for closed fracture  Injury of head, initial encounter     MEDICATIONS GIVEN DURING THIS VISIT:  Medications  acetaminophen (TYLENOL) tablet 1,000 mg (1,000 mg Oral Given 09/30/17 0808)     NEW OUTPATIENT MEDICATIONS STARTED DURING THIS VISIT:  None  Note:  This document was prepared using Dragon voice recognition software and may include unintentional dictation errors.  Nanda Quinton, MD Emergency Medicine    Rashena Dowling, [facility], MD 09/30/17 [phone]

## 2017-09-30 NOTE — Telephone Encounter (Signed)
[name] called to let you know that Kerry Flynn is in the ER and they are trying to get her admitted to a nursing center.

## 2017-09-30 NOTE — ED Notes (Signed)
Awaiting social work and placement/disposition

## 2017-09-30 NOTE — Consult Note (Signed)
Requesting physician: Nanda Quinton, EDP  Primary Care Physician: Fayrene Helper, MD  Reason for consultation: Chest pain after fall   History of Present Illness: Patient is a very pleasant, frail looking 83 year old woman with history of Parkinson's disease. Patient's niece and primary caregiver is at bedside and assists with most of the history. Apparently this morning while getting out of a lift chair she fell straight back onto the floor and complained of immediate chest pain. She is brought to the hospital where she has had extensive CT scans that are only positive for a minimally displaced sternal fracture. She is in significant pain. Discussed with niece who believes that they are no longer able to care for her at home and they are requesting admission for placement. Vital signs are stable, specifically no oxygen requirements, labs are essentially within normal limits other than a hemoglobin of 9.8 which appears to be at her baseline (hemoglobin was 9.9 in August 2018). Consultation has been requested by EDP.  Allergies:   Allergies  Allergen Reactions  . Penicillins Anaphylaxis    Has patient had a PCN reaction causing immediate rash, facial/tongue/throat swelling, SOB or lightheadedness with hypotension: Yes Has patient had a PCN reaction causing severe rash involving mucus membranes or skin necrosis: Yes Has patient had a PCN reaction that required hospitalization Yes Has patient had a PCN reaction occurring within the last 10 years: No If all of the above answers are "NO", then may proceed with Cephalosporin use.yes  . Clarithromycin   . Flagyl [Metronidazole] Other (See Comments)    Edema to lower extremeties      Past Medical History:  Diagnosis Date  . Abnormality of gait 11/17/2015  . Anemia   . Barrett's esophagus    Dr. Laural Golden, Dr. Moshe Cipro  . Benign recurrent vertigo   . Chronic constipation   . Chronic knee pain   . Colon polyps   . DDD (degenerative  disc disease), cervical   . DDD (degenerative disc disease), lumbar   . Dermatitis    recurrent  . Diverticulosis   . 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 Fatigue    chronic  . GERD (gastroesophageal reflux disease)   . Head trauma    status post fall; upper and lower extremities   . Hemorrhoid   . Hemorrhoids   . Hip fracture, left ([place])    s/p left arthroplasty 2/16  . History of broken collarbone   . Hypertension   . Infertility, female   . Intermittent vertigo   . Osteoarthritis    of the neck  . Osteoporosis    knees  . Parkinson disease ([place]) 11/17/2015  . Peripheral edema    chronic  . Stable angina ([facility])   . Varicose veins   . Vitamin D deficiency     Past Surgical History:  Procedure Laterality Date  . CARDIAC CATHETERIZATION  09/02/2005   mild-mod calcification in mid LAD (20-30% luminal obstruction), otherwise normal coronaries, patent LE arteries (Dr. Jackie Plum)  . CAROTID DOPPLER  02/2008   R & L ICAs 0-49% diameter reduction  . CATARACT EXTRACTION W/ INTRAOCULAR LENS IMPLANT Left 01/06/2012   left, Frannie  . CHOLECYSTECTOMY    . CHOLECYSTECTOMY, LAPAROSCOPIC  06/17/2006   with stones  . COLONOSCOPY  04/19/2012   Procedure: COLONOSCOPY;  Surgeon: Rogene Houston, MD;  Location: [facility];  Service: Endoscopy;  Laterality: N/A;  1200  . COLONOSCOPY N/A 09/25/2015   Procedure: COLONOSCOPY;  Surgeon: Bernadene Person  Gloriann Loan, MD;  Location: [facility];  Service: Endoscopy;  Laterality: N/A;  100  . COLONOSCOPY W/ POLYPECTOMY  05/29/1999, 01/12/2012  . dental implant   09/2014  . ESOPHAGOGASTRODUODENOSCOPY  01/12/2012  . EYE SURGERY  01/24/2012   bilateral cataract extraction  . HIP ARTHROPLASTY Left 01/31/2015   Procedure: ARTHROPLASTY BIPOLAR HIP;  Surgeon: Carole Civil, MD;  Location: [facility];  Service: Orthopedics;  Laterality: Left;  . KNEE ARTHROSCOPY Right 1999  . KNEE ARTHROSCOPY Left 1996  . NM MYOCAR PERF WALL MOTION  02/2012   lexiscan - small,  fized apical lateral bowel attenuation artifact; no reversible ischemia; EF 77%; non-diagnostic for ischemia; low risk  . PALATE SURGERY  1988  . ROTATOR CUFF REPAIR  05/14/2010  . TONSILLECTOMY    . TONSILLECTOMY AND ADENOIDECTOMY  1934  . TRANSTHORACIC ECHOCARDIOGRAM  01/2012   EF=>55%; mild mitral annular calcification & mild MR; trace TR; trace pulm valve regurg  . UPPER GI ENDOSCOPY  12/12/2006   with biopsy, with colonoscopy - dx: Barrett's disease    Scheduled Meds: Continuous Infusions: PRN Meds:.  Social History:  reports that she quit smoking about 58 years ago. She has a 0.12 pack-year smoking history. She has never used smokeless tobacco. She reports that she does not drink alcohol or use drugs.  Family History  Problem Relation Age of Onset  . Diabetes Mother   . Hypertension Mother   . CVA Mother   . Kidney disease Mother   . Heart failure Mother   . Arthritis Mother   . Cancer Father        larengeal  . Heart disease Father   . Congestive Heart Failure Father   . Bradycardia Father   . Hypotension Father   . Parkinson's disease Sister   . Osteoporosis Sister   . Stroke Maternal Grandmother   . Stroke Maternal Grandfather   . Colon cancer Neg Hx     Review of Systems:  Difficult to obtain as she remains very somnolent after receiving pain medication.  Physical Exam: Blood pressure (!) 142/63, pulse 72, temperature 98.8 F (37.1 C), temperature source Oral, resp. rate 16, height '5\' 6"'$  (1.676 m), weight 65.8 kg (145 lb), SpO2 95 %. General: Frail elderly lady lying in bed slumped to the left, opens eyes to voice, is not able to answer many questions however does grab her chest in pain. HEENT: Normocephalic, atraumatic, pupils equally round and reactive, wears corrective lenses Neck: Supple, no JVD, no lymphadenopathy, no bruits, no goiter Cardiovascular: Regular rate and rhythm, no murmurs, rubs or gallops, pain to palpation of sternal area Lungs clear to  auscultation bilaterally Abdomen: Soft, nontender, nondistended, positive bowel sounds, no masses or organomegaly noted. Extremities: No clubbing, cyanosis or edema, positive pulses Neurologic: Unable to fully assess given current mental state  Labs on Admission:  Results for orders placed or performed during the hospital encounter of 09/30/17 (from the past 48 hour(s))  Urinalysis, Routine w reflex microscopic     Status: None   Collection Time: 09/30/17 10:09 AM  Result Value Ref Range   Color, Urine YELLOW YELLOW   APPearance CLEAR CLEAR   Specific Gravity, Urine 1.015 1.005 - 1.030   pH 7.0 5.0 - 8.0   Glucose, UA NEGATIVE NEGATIVE mg/dL   Hgb urine dipstick NEGATIVE NEGATIVE   Bilirubin Urine NEGATIVE NEGATIVE   Ketones, ur NEGATIVE NEGATIVE mg/dL   Protein, ur NEGATIVE NEGATIVE mg/dL   Nitrite NEGATIVE NEGATIVE  Leukocytes, UA NEGATIVE NEGATIVE  Basic metabolic panel     Status: Abnormal   Collection Time: 09/30/17 10:33 AM  Result Value Ref Range   Sodium 138 135 - 145 mmol/L   Potassium 3.5 3.5 - 5.1 mmol/L   Chloride 105 101 - 111 mmol/L   CO2 25 22 - 32 mmol/L   Glucose, Bld 98 65 - 99 mg/dL   BUN 27 (H) 6 - 20 mg/dL   Creatinine, Ser 0.76 0.44 - 1.00 mg/dL   Calcium 8.9 8.9 - 10.3 mg/dL   GFR calc non Af Amer >60 >60 mL/min   GFR calc Af Amer >60 >60 mL/min    Comment: (NOTE) The eGFR has been calculated using the CKD EPI equation. This calculation has not been validated in all clinical situations. eGFR's persistently <60 mL/min signify possible Chronic Kidney Disease.    Anion gap 8 5 - 15  CBC with Differential     Status: Abnormal   Collection Time: 09/30/17 10:33 AM  Result Value Ref Range   WBC 11.0 (H) 4.0 - 10.5 K/uL   RBC 3.18 (L) 3.87 - 5.11 MIL/uL   Hemoglobin 9.8 (L) 12.0 - 15.0 g/dL   HCT 30.0 (L) 36.0 - 46.0 %   MCV 94.3 78.0 - 100.0 fL   MCH 30.8 26.0 - 34.0 pg   MCHC 32.7 30.0 - 36.0 g/dL   RDW 13.1 11.5 - 15.5 %   Platelets 139 (L) 150 -  400 K/uL   Neutrophils Relative % 84 %   Neutro Abs 9.2 (H) 1.7 - 7.7 K/uL   Lymphocytes Relative 5 %   Lymphs Abs 0.6 (L) 0.7 - 4.0 K/uL   Monocytes Relative 9 %   Monocytes Absolute 1.0 0.1 - 1.0 K/uL   Eosinophils Relative 2 %   Eosinophils Absolute 0.3 0.0 - 0.7 K/uL   Basophils Relative 0 %   Basophils Absolute 0.0 0.0 - 0.1 K/uL    Radiological Exams on Admission: Dg Chest 2 View  Result Date: 09/30/2017 CLINICAL DATA:  Sternal pain after falling today. EXAM: CHEST  2 VIEW COMPARISON:  04/04/2014.  01/30/2015. FINDINGS: Heart size is normal. There is atherosclerosis and tortuosity of the aorta. The lungs show hyperinflation and mild scarring. Calcified granuloma versus costal cartilage calcification left mid chest. No evidence of acute rib fracture. Some deformity of the right third and fourth ribs look like old healed fractures. On the lateral view, sternal detail is somewhat limited, but there does appear to be sternal fracture. There may also be lucency within the body of the sternum. Consider chest CT to evaluate for underlying sternal lesion. IMPRESSION: No active cardiopulmonary disease. There does appear to be sternal malalignment consistent with sternal fracture. Question lytic change of the sternum. Consider chest CT. Electronically Signed   By: Nelson Chimes M.D.   On: 09/30/2017 08:38   Ct Head Wo Contrast  Result Date: 09/30/2017 CLINICAL DATA:  Pain following fall EXAM: CT HEAD WITHOUT CONTRAST CT CERVICAL SPINE WITHOUT CONTRAST TECHNIQUE: Multidetector CT imaging of the head and cervical spine was performed following the standard protocol without intravenous contrast. Multiplanar CT image reconstructions of the cervical spine were also generated. COMPARISON:  CT head and CT cervical spine August 05, 2017 FINDINGS: CT HEAD FINDINGS Brain: There is moderate diffuse atrophy. There is no intracranial mass, hemorrhage, extra-axial fluid collection, or midline shift. There is patchy  small vessel disease in the centra semiovale bilaterally. Elsewhere gray-white compartments appear normal. No evident acute  infarct. Vascular: No hyperdense vessel. There is calcification in both carotid siphon regions. Skull: The bony calvarium appears intact. Sinuses/Orbits: There is mucosal thickening in multiple ethmoid air cells bilaterally with opacification in portions of several ethmoid air cells. Other visualized paranasal sinuses are clear. Orbits appear symmetric bilaterally. There is apparent scleral banding bilaterally. Other: Mastoid air cells are clear. There is debris in the left external auditory canal. There is an apparent sebaceous cyst overlying the left frontal bone measuring 6 x 5 mm. CT CERVICAL SPINE FINDINGS Alignment: There is 2 mm of anterolisthesis of C3 on C4. There is 1 mm of anterolisthesis of C4 on C5. No other spondylolisthesis evident. Skull base and vertebrae: Skull base and craniocervical junction regions appear normal. No fracture is evident. There are no blastic or lytic bone lesions. Bones are osteoporotic. Soft tissues and spinal canal: Prevertebral soft tissues and predental space regions are normal. There is no paraspinous lesion. No cord or canal hematoma evident. Disc levels: There is mild disc space narrowing at C4-5, C5-6, and C6-7. There is facet hypertrophy at multiple levels. There is no evident disc extrusion or stenosis. Upper chest: Lung apices are clear. Other: Multinodular goiter is present, stable. Foci of carotid artery calcification bilaterally. IMPRESSION: CT head: Moderate atrophy with small vessel disease in the periventricular white matter, stable. No intracranial mass hemorrhage, or extra-axial fluid collection. No evident acute infarct. Foci of arterial vascular calcification noted. Areas of paranasal sinus disease noted. Probable cerumen noted in the left external auditory canal. Small sebaceous cyst overlying the left frontal bone. CT cervical spine:  No demonstrable fracture. Areas of slight spondylolisthesis at C3-4 and C4-5 are felt to be secondary to spondylosis and appear unchanged. There is multilevel osteoarthritic change. There is multinodular goiter without dominant mass, stable. There are foci of carotid artery calcification. Electronically Signed   By: Lowella Grip III M.D.   On: 09/30/2017 09:05   Ct Chest Wo Contrast  Result Date: 09/30/2017 CLINICAL DATA:  Pain following fall EXAM: CT CHEST WITHOUT CONTRAST TECHNIQUE: Multidetector CT imaging of the chest was performed following the standard protocol without IV contrast. COMPARISON:  Chest radiograph September 30, 2017 FINDINGS: Cardiovascular: There is no thoracic aortic aneurysm. Visualized great vessels appear unremarkable except for moderate atherosclerotic calcification in the proximal left subclavian artery. There is atherosclerotic calcification in the aorta. There are foci of coronary artery calcification. Pericardium is not appreciably thickened. There is no periaortic fluid on this noncontrast enhanced study. Mediastinum/Nodes: Thyroid appears unremarkable. There are subcentimeter mediastinal lymph nodes. There is no adenopathy evident in the thoracic region. No esophageal lesions are evident. Lungs/Pleura: There is no evident pneumothorax or evidence of parenchymal lung contusion. There is patchy atelectatic change in the lung bases. There is no frank edema or consolidation. There is no appreciable pleural effusion or pleural thickening. Trachea appears unremarkable. Upper Abdomen: There is atherosclerotic calcification in the aorta. There is an apparent left adrenal adenoma measuring 2.1 x 1.7 cm. Visualized upper abdominal structures otherwise appear unremarkable on this noncontrast enhanced study. Musculoskeletal: There is evidence of a a fracture of the proximal sternum, age uncertain. There is no associated hematoma. There is no lytic or destructive lesion involving sternum.  There is degenerative change in the thoracic spine. No fracture evident elsewhere. No blastic or lytic bone lesions. No chest wall hematoma evident. IMPRESSION: 1. Age uncertain fracture of the proximal sternum. No destructive lesion involving the sternum seen. No fracture seen elsewhere. No blastic or lytic bone  lesions. 2. Bibasilar atelectasis. No pneumothorax or parenchymal lung contusion. No edema or consolidation. 3.  No evident adenopathy. 4. Aortic atherosclerosis. Foci of coronary artery calcification as well as calcification in the proximal left subclavian artery. No periaortic fluid. Aortic Atherosclerosis (ICD10-I70.0). Electronically Signed   By: Lowella Grip III M.D.   On: 09/30/2017 10:08   Ct Cervical Spine Wo Contrast  Result Date: 09/30/2017 CLINICAL DATA:  Pain following fall EXAM: CT HEAD WITHOUT CONTRAST CT CERVICAL SPINE WITHOUT CONTRAST TECHNIQUE: Multidetector CT imaging of the head and cervical spine was performed following the standard protocol without intravenous contrast. Multiplanar CT image reconstructions of the cervical spine were also generated. COMPARISON:  CT head and CT cervical spine August 05, 2017 FINDINGS: CT HEAD FINDINGS Brain: There is moderate diffuse atrophy. There is no intracranial mass, hemorrhage, extra-axial fluid collection, or midline shift. There is patchy small vessel disease in the centra semiovale bilaterally. Elsewhere gray-white compartments appear normal. No evident acute infarct. Vascular: No hyperdense vessel. There is calcification in both carotid siphon regions. Skull: The bony calvarium appears intact. Sinuses/Orbits: There is mucosal thickening in multiple ethmoid air cells bilaterally with opacification in portions of several ethmoid air cells. Other visualized paranasal sinuses are clear. Orbits appear symmetric bilaterally. There is apparent scleral banding bilaterally. Other: Mastoid air cells are clear. There is debris in the left  external auditory canal. There is an apparent sebaceous cyst overlying the left frontal bone measuring 6 x 5 mm. CT CERVICAL SPINE FINDINGS Alignment: There is 2 mm of anterolisthesis of C3 on C4. There is 1 mm of anterolisthesis of C4 on C5. No other spondylolisthesis evident. Skull base and vertebrae: Skull base and craniocervical junction regions appear normal. No fracture is evident. There are no blastic or lytic bone lesions. Bones are osteoporotic. Soft tissues and spinal canal: Prevertebral soft tissues and predental space regions are normal. There is no paraspinous lesion. No cord or canal hematoma evident. Disc levels: There is mild disc space narrowing at C4-5, C5-6, and C6-7. There is facet hypertrophy at multiple levels. There is no evident disc extrusion or stenosis. Upper chest: Lung apices are clear. Other: Multinodular goiter is present, stable. Foci of carotid artery calcification bilaterally. IMPRESSION: CT head: Moderate atrophy with small vessel disease in the periventricular white matter, stable. No intracranial mass hemorrhage, or extra-axial fluid collection. No evident acute infarct. Foci of arterial vascular calcification noted. Areas of paranasal sinus disease noted. Probable cerumen noted in the left external auditory canal. Small sebaceous cyst overlying the left frontal bone. CT cervical spine: No demonstrable fracture. Areas of slight spondylolisthesis at C3-4 and C4-5 are felt to be secondary to spondylosis and appear unchanged. There is multilevel osteoarthritic change. There is multinodular goiter without dominant mass, stable. There are foci of carotid artery calcification. Electronically Signed   By: Lowella Grip III M.D.   On: 09/30/2017 09:05   Ct Thoracic Spine Wo Contrast  Result Date: 09/30/2017 CLINICAL DATA:  Fall.  Sternal pain. Initial encounter. EXAM: CT THORACIC SPINE WITHOUT CONTRAST TECHNIQUE: Multidetector CT images of the thoracic were obtained using the  standard protocol without intravenous contrast. COMPARISON:  None. FINDINGS: Alignment: Exaggerated kyphosis.  No listhesis. Vertebrae: T6 compression fracture that has a chronic appearance and mild depression. No retropulsion. No acute fracture noted. No evidence of bone lesion. Remote lateral right third and fourth rib fractures. Paraspinal and other soft tissues: No evidence of injury. 19 mm left adrenal nodule with densitometry consistent with adenoma. Probable  dermal inclusion cyst in the left low back. Disc levels: Generalized spondylosis and disc narrowing, age congruent. No evidence of cord impingement. IMPRESSION: T6 compression fracture with chronic appearance; height loss is mild. No evidence of acute thoracic injury. Electronically Signed   By: Monte Fantasia M.D.   On: 09/30/2017 09:04   Ct No Charge  Result Date: 09/30/2017 CLINICAL DATA:  Pain following fall EXAM: CT CHEST WITHOUT CONTRAST TECHNIQUE: Multidetector CT imaging of the chest was performed following the standard protocol without IV contrast. COMPARISON:  Chest radiograph September 30, 2017 FINDINGS: Cardiovascular: There is no thoracic aortic aneurysm. Visualized great vessels appear unremarkable except for moderate atherosclerotic calcification in the proximal left subclavian artery. There is atherosclerotic calcification in the aorta. There are foci of coronary artery calcification. Pericardium is not appreciably thickened. There is no periaortic fluid on this noncontrast enhanced study. Mediastinum/Nodes: Thyroid appears unremarkable. There are subcentimeter mediastinal lymph nodes. There is no adenopathy evident in the thoracic region. No esophageal lesions are evident. Lungs/Pleura: There is no evident pneumothorax or evidence of parenchymal lung contusion. There is patchy atelectatic change in the lung bases. There is no frank edema or consolidation. There is no appreciable pleural effusion or pleural thickening. Trachea appears  unremarkable. Upper Abdomen: There is atherosclerotic calcification in the aorta. There is an apparent left adrenal adenoma measuring 2.1 x 1.7 cm. Visualized upper abdominal structures otherwise appear unremarkable on this noncontrast enhanced study. Musculoskeletal: There is evidence of a a fracture of the proximal sternum, age uncertain. There is no associated hematoma. There is no lytic or destructive lesion involving sternum. There is degenerative change in the thoracic spine. No fracture evident elsewhere. No blastic or lytic bone lesions. No chest wall hematoma evident. IMPRESSION: 1. Age uncertain fracture of the proximal sternum. No destructive lesion involving the sternum seen. No fracture seen elsewhere. No blastic or lytic bone lesions. 2. Bibasilar atelectasis. No pneumothorax or parenchymal lung contusion. No edema or consolidation. 3.  No evident adenopathy. 4. Aortic atherosclerosis. Foci of coronary artery calcification as well as calcification in the proximal left subclavian artery. No periaortic fluid. Aortic Atherosclerosis (ICD10-I70.0). Electronically Signed   By: Lowella Grip III M.D.   On: 09/30/2017 10:08    Assessment/Plan  Acute sternal fracture -Patient currently with significant pain, continue small doses of pain medication as needed, prefer to use NSAIDs given her age, frailty and fall risk. -She has had multiple falls, over the past 6 weeks has also suffered a collarbone fracture. -Patient's niece, who is also her primary caregiver, at bedside states that they do not believe that they can care for her at home in her current state and are requesting placement. -I have involved the social worker who advises me that her insurance does not require a 3 night qualifying stay (she does not meet inpatient criteria anyways), patient's family is willing to pay privately if required. Social work has told me that they will be able to place patient from the emergency  department. -Above has been discussed with Dr. Laverta [place], [place].   Time Spent on Consultation: 85 minutes  HERNANDEZ ACOSTA,Nieves Barberi [facility]  [phone] 09/30/2017, 2:32 PM

## 2017-09-30 NOTE — ED Notes (Signed)
Call to [facility] [phone]

## 2017-09-30 NOTE — ED Notes (Signed)
Pt continues to await placement and or disposition

## 2017-09-30 NOTE — Evaluation (Signed)
Physical Therapy Evaluation Patient Details Name: KAMMIE SCIOLI MRN: [id number] DOB: December 08, 1927 Today's Date: 09/30/2017   History of Present Illness  83 y/o female s/p fall face down with sternal pain with history of recent left collar bone surgery Darcus Edds Lentz is a 83 y/o female)    Clinical Impression  Patient requires much assistance to sit up at bedside and limited to a couple of shuffling steps at bedside, severe risk for falls and not safe without Max assist due to sternal pain/generalized weakness.  Patient will benefit from recommended venue below to increase strength, balance, endurance for safe ADLs and gait.  Plan: patient not to be admitted and will be placed in an outside facility.  Patient discharged from physical therapy to care of nursing.    Follow Up Recommendations SNF;Supervision/Assistance - 24 hour    Equipment Recommendations       Recommendations for Other Services       Precautions / Restrictions Precautions Precautions: Fall Precaution Comments: sternal precautions Restrictions Weight Bearing Restrictions: No Other Position/Activity Restrictions:  (no)      Mobility  Bed Mobility Overal bed mobility: Needs Assistance Bed Mobility: Supine to Sit;Sit to Supine     Supine to sit: Mod assist Sit to supine: Mod assist;Max assist   General bed mobility comments: demonstrates slow labored movement  Transfers Overall transfer level: Needs assistance   Transfers: Sit to/from Stand Sit to Stand: Max assist         General transfer comment: required much time  Ambulation/Gait Ambulation/Gait assistance: Max assist   Assistive device: Rolling walker (2 wheeled)     Gait velocity interpretation: Below normal speed for age/gender General Gait Details: limited to a couple of shufflling steps due to weakness  Stairs            Wheelchair Mobility    Modified Rankin (Stroke Patients Only)       Balance Overall balance  assessment: Needs assistance Sitting-balance support: Bilateral upper extremity supported;Feet supported Sitting balance-Leahy Scale: Fair Sitting balance - Comments: patient fearful of sliding off bed   Standing balance support: Bilateral upper extremity supported;During functional activity Standing balance-Leahy Scale: Poor Standing balance comment: audible bone on bone contact at knees and hips                             Pertinent Vitals/Pain Pain Assessment: 0-10 Pain Score: 10-Worst pain ever (with pressure or movement of sternum) Pain Location: sternum Pain Descriptors / Indicators: Grimacing;Guarding;Pressure Pain Intervention(s): Limited activity within patient's tolerance;Monitored during session    Home Living Family/patient expects to be discharged to:: Private residence   Available Help at Discharge: Family;Personal care attendant;Available 24 hours/day Type of Home: House       Home Layout: Multi-level Home Equipment: Walker - 2 wheels;Walker - 4 wheels;Bedside commode;Wheelchair - manual Additional Comments: Patient's neice states patient has difficulty using bathroom due to stair lift blocking door, patient has to enter bathroom at an perfect angle to get in    Prior Function Level of Independence: Needs assistance   Gait / Transfers Assistance Needed: assited by sitters for short distanced household gait using Rollator or RW  ADL's / Homemaking Assistance Needed: assisted by home aides        Hand Dominance        Extremity/Trunk Assessment   Upper Extremity Assessment Upper Extremity Assessment: Generalized weakness    Lower Extremity Assessment Lower Extremity Assessment: Generalized weakness  Cervical / Trunk Assessment Cervical / Trunk Assessment: Kyphotic  Communication   Communication: No difficulties  Cognition Arousal/Alertness: Awake/alert Behavior During Therapy: WFL for tasks assessed/performed Overall Cognitive  Status: Within Functional Limits for tasks assessed                                        General Comments      Exercises     Assessment/Plan    PT Assessment Patient needs continued PT services  PT Problem List         PT Treatment Interventions      PT Goals (Current goals can be found in the Care Plan section)  Acute Rehab PT Goals Patient Stated Goal: Return home after rehab PT Goal Formulation: With patient/family Time For Goal Achievement: 10/14/17    Frequency     Barriers to discharge        Co-evaluation               AM-PAC PT "6 Clicks" Daily Activity  Outcome Measure Difficulty turning over in bed (including adjusting bedclothes, sheets and blankets)?: Unable Difficulty moving from lying on back to sitting on the side of the bed? : Unable Difficulty sitting down on and standing up from a chair with arms (e.g., wheelchair, bedside commode, etc,.)?: Unable Help needed moving to and from a bed to chair (including a wheelchair)?: A Lot Help needed walking in hospital room?: A Lot Help needed climbing 3-5 steps with a railing? : Total 6 Click Score: 8    End of Session Equipment Utilized During Treatment: Gait belt Activity Tolerance: Patient limited by fatigue;Patient limited by pain Patient left: in bed;with call bell/phone within reach;with family/visitor present Nurse Communication: Mobility status PT Visit Diagnosis: Unsteadiness on feet (R26.81);Other abnormalities of gait and mobility (R26.89);Muscle weakness (generalized) (M62.81);History of falling (Z91.81)    Time: 0947-0962 PT Time Calculation (min) (ACUTE ONLY): 32 min   Charges:     PT Treatments $Therapeutic Activity: 23-37 mins   PT G Codes:   PT G-Codes **NOT FOR INPATIENT CLASS** Functional Assessment Tool Used: AM-PAC 6 Clicks Basic Mobility Functional Limitation: Mobility: Walking and moving around Mobility: Walking and Moving Around Current Status (E3662):  At least 80 percent but less than 100 percent impaired, limited or restricted Mobility: Walking and Moving Around Goal Status [phone]): At least 80 percent but less than 100 percent impaired, limited or restricted Mobility: Walking and Moving Around Discharge Status [phone]): At least 80 percent but less than 100 percent impaired, limited or restricted    3:15 PM, 09/30/17 Lonell Grandchild, MPT Physical Therapist with [facility] 336 [phone] office [phone] mobile phone

## 2017-10-03 DIAGNOSIS — E7849 Other hyperlipidemia: Secondary | ICD-10-CM | POA: Diagnosis not present

## 2017-10-03 DIAGNOSIS — K219 Gastro-esophageal reflux disease without esophagitis: Secondary | ICD-10-CM | POA: Diagnosis not present

## 2017-10-03 DIAGNOSIS — S2222XD Fracture of body of sternum, subsequent encounter for fracture with routine healing: Secondary | ICD-10-CM | POA: Diagnosis not present

## 2017-10-03 DIAGNOSIS — Z79899 Other long term (current) drug therapy: Secondary | ICD-10-CM | POA: Diagnosis not present

## 2017-10-03 DIAGNOSIS — R112 Nausea with vomiting, unspecified: Secondary | ICD-10-CM | POA: Diagnosis not present

## 2017-10-03 DIAGNOSIS — Z658 Other specified problems related to psychosocial circumstances: Secondary | ICD-10-CM | POA: Diagnosis not present

## 2017-10-03 DIAGNOSIS — G2581 Restless legs syndrome: Secondary | ICD-10-CM | POA: Diagnosis not present

## 2017-10-03 DIAGNOSIS — M6281 Muscle weakness (generalized): Secondary | ICD-10-CM | POA: Diagnosis not present

## 2017-10-03 DIAGNOSIS — G20 Parkinson's disease: Secondary | ICD-10-CM | POA: Diagnosis not present

## 2017-10-03 DIAGNOSIS — S0990XD Unspecified injury of head, subsequent encounter: Secondary | ICD-10-CM | POA: Diagnosis not present

## 2017-10-03 DIAGNOSIS — H8110 Benign paroxysmal vertigo, unspecified ear: Secondary | ICD-10-CM | POA: Diagnosis not present

## 2017-10-03 DIAGNOSIS — N3281 Overactive bladder: Secondary | ICD-10-CM | POA: Diagnosis not present

## 2017-10-03 DIAGNOSIS — S2222XA Fracture of body of sternum, initial encounter for closed fracture: Secondary | ICD-10-CM | POA: Diagnosis not present

## 2017-10-03 DIAGNOSIS — W19XXXD Unspecified fall, subsequent encounter: Secondary | ICD-10-CM | POA: Diagnosis not present

## 2017-10-03 DIAGNOSIS — K5909 Other constipation: Secondary | ICD-10-CM | POA: Diagnosis not present

## 2017-10-03 DIAGNOSIS — D518 Other vitamin B12 deficiency anemias: Secondary | ICD-10-CM | POA: Diagnosis not present

## 2017-10-03 DIAGNOSIS — E119 Type 2 diabetes mellitus without complications: Secondary | ICD-10-CM | POA: Diagnosis not present

## 2017-10-03 DIAGNOSIS — I10 Essential (primary) hypertension: Secondary | ICD-10-CM | POA: Diagnosis not present

## 2017-10-03 DIAGNOSIS — R69 Illness, unspecified: Secondary | ICD-10-CM | POA: Diagnosis not present

## 2017-10-03 NOTE — Telephone Encounter (Signed)
Noted and aware of her placement

## 2017-10-04 ENCOUNTER — Telehealth: Payer: Self-pay | Admitting: Family Medicine

## 2017-10-04 NOTE — Telephone Encounter (Signed)
Barbra Sarks, [facility] in [place], is calling for additional information on patient.  Callback# [phone]

## 2017-10-04 NOTE — Telephone Encounter (Signed)
She just needed updated immunization information.

## 2017-10-07 DIAGNOSIS — R112 Nausea with vomiting, unspecified: Secondary | ICD-10-CM | POA: Diagnosis not present

## 2017-10-11 DIAGNOSIS — G20 Parkinson's disease: Secondary | ICD-10-CM | POA: Diagnosis not present

## 2017-10-11 DIAGNOSIS — I10 Essential (primary) hypertension: Secondary | ICD-10-CM | POA: Diagnosis not present

## 2017-10-11 DIAGNOSIS — K5909 Other constipation: Secondary | ICD-10-CM | POA: Diagnosis not present

## 2017-10-11 DIAGNOSIS — S2222XA Fracture of body of sternum, initial encounter for closed fracture: Secondary | ICD-10-CM | POA: Diagnosis not present

## 2017-10-12 DIAGNOSIS — H8110 Benign paroxysmal vertigo, unspecified ear: Secondary | ICD-10-CM | POA: Diagnosis not present

## 2017-10-12 DIAGNOSIS — S2222XA Fracture of body of sternum, initial encounter for closed fracture: Secondary | ICD-10-CM | POA: Diagnosis not present

## 2017-10-12 DIAGNOSIS — G20 Parkinson's disease: Secondary | ICD-10-CM | POA: Diagnosis not present

## 2017-10-12 DIAGNOSIS — K219 Gastro-esophageal reflux disease without esophagitis: Secondary | ICD-10-CM | POA: Diagnosis not present

## 2017-10-17 DIAGNOSIS — K219 Gastro-esophageal reflux disease without esophagitis: Secondary | ICD-10-CM | POA: Diagnosis not present

## 2017-10-17 DIAGNOSIS — R5381 Other malaise: Secondary | ICD-10-CM | POA: Diagnosis not present

## 2017-10-17 DIAGNOSIS — G2581 Restless legs syndrome: Secondary | ICD-10-CM | POA: Diagnosis not present

## 2017-10-17 DIAGNOSIS — G20 Parkinson's disease: Secondary | ICD-10-CM | POA: Diagnosis not present

## 2017-10-17 DIAGNOSIS — S0990XD Unspecified injury of head, subsequent encounter: Secondary | ICD-10-CM | POA: Diagnosis not present

## 2017-10-17 DIAGNOSIS — I10 Essential (primary) hypertension: Secondary | ICD-10-CM | POA: Diagnosis not present

## 2017-10-17 DIAGNOSIS — S2220XA Unspecified fracture of sternum, initial encounter for closed fracture: Secondary | ICD-10-CM | POA: Diagnosis not present

## 2017-10-17 DIAGNOSIS — N3281 Overactive bladder: Secondary | ICD-10-CM | POA: Diagnosis not present

## 2017-10-17 DIAGNOSIS — G243 Spasmodic torticollis: Secondary | ICD-10-CM | POA: Diagnosis not present

## 2017-10-17 DIAGNOSIS — S2222XD Fracture of body of sternum, subsequent encounter for fracture with routine healing: Secondary | ICD-10-CM | POA: Diagnosis not present

## 2017-10-17 DIAGNOSIS — R69 Illness, unspecified: Secondary | ICD-10-CM | POA: Diagnosis not present

## 2017-10-19 DIAGNOSIS — R5381 Other malaise: Secondary | ICD-10-CM | POA: Diagnosis not present

## 2017-10-19 DIAGNOSIS — G243 Spasmodic torticollis: Secondary | ICD-10-CM | POA: Diagnosis not present

## 2017-10-19 DIAGNOSIS — S2220XA Unspecified fracture of sternum, initial encounter for closed fracture: Secondary | ICD-10-CM | POA: Diagnosis not present

## 2017-10-19 DIAGNOSIS — G20 Parkinson's disease: Secondary | ICD-10-CM | POA: Diagnosis not present

## 2017-11-07 DIAGNOSIS — G20 Parkinson's disease: Secondary | ICD-10-CM | POA: Diagnosis not present

## 2017-11-07 DIAGNOSIS — K219 Gastro-esophageal reflux disease without esophagitis: Secondary | ICD-10-CM | POA: Diagnosis not present

## 2017-11-07 DIAGNOSIS — R5381 Other malaise: Secondary | ICD-10-CM | POA: Diagnosis not present

## 2017-11-07 DIAGNOSIS — I10 Essential (primary) hypertension: Secondary | ICD-10-CM | POA: Diagnosis not present

## 2017-11-08 ENCOUNTER — Ambulatory Visit: Payer: Medicare HMO | Admitting: Neurology

## 2017-11-08 ENCOUNTER — Telehealth: Payer: Self-pay | Admitting: Neurology

## 2017-11-08 NOTE — Telephone Encounter (Signed)
This patient did not show for a revisit appointment today. 

## 2017-11-09 ENCOUNTER — Encounter: Payer: Self-pay | Admitting: Neurology

## 2017-11-14 DIAGNOSIS — Z23 Encounter for immunization: Secondary | ICD-10-CM | POA: Diagnosis not present

## 2017-11-14 DIAGNOSIS — I10 Essential (primary) hypertension: Secondary | ICD-10-CM | POA: Diagnosis not present

## 2017-11-14 DIAGNOSIS — R5381 Other malaise: Secondary | ICD-10-CM | POA: Diagnosis not present

## 2017-11-18 ENCOUNTER — Other Ambulatory Visit: Payer: Self-pay | Admitting: Neurology

## 2017-11-24 DIAGNOSIS — I10 Essential (primary) hypertension: Secondary | ICD-10-CM | POA: Diagnosis not present

## 2017-11-24 DIAGNOSIS — M1388 Other specified arthritis, other site: Secondary | ICD-10-CM | POA: Diagnosis not present

## 2017-11-24 DIAGNOSIS — K219 Gastro-esophageal reflux disease without esophagitis: Secondary | ICD-10-CM | POA: Diagnosis not present

## 2017-11-24 DIAGNOSIS — R69 Illness, unspecified: Secondary | ICD-10-CM | POA: Diagnosis not present

## 2017-11-28 DIAGNOSIS — S0990XD Unspecified injury of head, subsequent encounter: Secondary | ICD-10-CM | POA: Diagnosis not present

## 2017-11-28 DIAGNOSIS — R262 Difficulty in walking, not elsewhere classified: Secondary | ICD-10-CM | POA: Diagnosis not present

## 2017-11-28 DIAGNOSIS — G20 Parkinson's disease: Secondary | ICD-10-CM | POA: Diagnosis not present

## 2017-11-28 DIAGNOSIS — R69 Illness, unspecified: Secondary | ICD-10-CM | POA: Diagnosis not present

## 2017-11-28 DIAGNOSIS — G8929 Other chronic pain: Secondary | ICD-10-CM | POA: Diagnosis not present

## 2017-11-28 DIAGNOSIS — M159 Polyosteoarthritis, unspecified: Secondary | ICD-10-CM | POA: Diagnosis not present

## 2017-11-28 DIAGNOSIS — I739 Peripheral vascular disease, unspecified: Secondary | ICD-10-CM | POA: Diagnosis not present

## 2017-11-28 DIAGNOSIS — G2581 Restless legs syndrome: Secondary | ICD-10-CM | POA: Diagnosis not present

## 2017-11-28 DIAGNOSIS — M170 Bilateral primary osteoarthritis of knee: Secondary | ICD-10-CM | POA: Diagnosis not present

## 2017-11-28 DIAGNOSIS — S2222XD Fracture of body of sternum, subsequent encounter for fracture with routine healing: Secondary | ICD-10-CM | POA: Diagnosis not present

## 2017-11-28 DIAGNOSIS — L97511 Non-pressure chronic ulcer of other part of right foot limited to breakdown of skin: Secondary | ICD-10-CM | POA: Diagnosis not present

## 2017-11-28 DIAGNOSIS — I2510 Atherosclerotic heart disease of native coronary artery without angina pectoris: Secondary | ICD-10-CM | POA: Diagnosis not present

## 2017-11-28 DIAGNOSIS — M79674 Pain in right toe(s): Secondary | ICD-10-CM | POA: Diagnosis not present

## 2017-11-28 DIAGNOSIS — N3941 Urge incontinence: Secondary | ICD-10-CM | POA: Diagnosis not present

## 2017-11-28 DIAGNOSIS — I10 Essential (primary) hypertension: Secondary | ICD-10-CM | POA: Diagnosis not present

## 2017-11-28 DIAGNOSIS — E042 Nontoxic multinodular goiter: Secondary | ICD-10-CM | POA: Diagnosis not present

## 2017-11-29 DIAGNOSIS — N3281 Overactive bladder: Secondary | ICD-10-CM | POA: Diagnosis not present

## 2017-11-29 DIAGNOSIS — S2222XD Fracture of body of sternum, subsequent encounter for fracture with routine healing: Secondary | ICD-10-CM | POA: Diagnosis not present

## 2017-11-29 DIAGNOSIS — R5381 Other malaise: Secondary | ICD-10-CM | POA: Diagnosis not present

## 2017-11-29 DIAGNOSIS — I10 Essential (primary) hypertension: Secondary | ICD-10-CM | POA: Diagnosis not present

## 2017-11-29 DIAGNOSIS — S0990XD Unspecified injury of head, subsequent encounter: Secondary | ICD-10-CM | POA: Diagnosis not present

## 2017-11-29 DIAGNOSIS — G20 Parkinson's disease: Secondary | ICD-10-CM | POA: Diagnosis not present

## 2017-11-29 DIAGNOSIS — R69 Illness, unspecified: Secondary | ICD-10-CM | POA: Diagnosis not present

## 2017-11-29 DIAGNOSIS — I739 Peripheral vascular disease, unspecified: Secondary | ICD-10-CM | POA: Diagnosis not present

## 2017-11-29 DIAGNOSIS — R262 Difficulty in walking, not elsewhere classified: Secondary | ICD-10-CM | POA: Diagnosis not present

## 2017-11-29 DIAGNOSIS — M170 Bilateral primary osteoarthritis of knee: Secondary | ICD-10-CM | POA: Diagnosis not present

## 2017-11-29 DIAGNOSIS — S2220XD Unspecified fracture of sternum, subsequent encounter for fracture with routine healing: Secondary | ICD-10-CM | POA: Diagnosis not present

## 2017-11-29 DIAGNOSIS — I2510 Atherosclerotic heart disease of native coronary artery without angina pectoris: Secondary | ICD-10-CM | POA: Diagnosis not present

## 2017-11-29 DIAGNOSIS — G2581 Restless legs syndrome: Secondary | ICD-10-CM | POA: Diagnosis not present

## 2017-11-30 DIAGNOSIS — I2510 Atherosclerotic heart disease of native coronary artery without angina pectoris: Secondary | ICD-10-CM | POA: Diagnosis not present

## 2017-11-30 DIAGNOSIS — I739 Peripheral vascular disease, unspecified: Secondary | ICD-10-CM | POA: Diagnosis not present

## 2017-11-30 DIAGNOSIS — G20 Parkinson's disease: Secondary | ICD-10-CM | POA: Diagnosis not present

## 2017-11-30 DIAGNOSIS — R69 Illness, unspecified: Secondary | ICD-10-CM | POA: Diagnosis not present

## 2017-11-30 DIAGNOSIS — I10 Essential (primary) hypertension: Secondary | ICD-10-CM | POA: Diagnosis not present

## 2017-11-30 DIAGNOSIS — M170 Bilateral primary osteoarthritis of knee: Secondary | ICD-10-CM | POA: Diagnosis not present

## 2017-11-30 DIAGNOSIS — G2581 Restless legs syndrome: Secondary | ICD-10-CM | POA: Diagnosis not present

## 2017-11-30 DIAGNOSIS — S2222XD Fracture of body of sternum, subsequent encounter for fracture with routine healing: Secondary | ICD-10-CM | POA: Diagnosis not present

## 2017-11-30 DIAGNOSIS — S0990XD Unspecified injury of head, subsequent encounter: Secondary | ICD-10-CM | POA: Diagnosis not present

## 2017-11-30 DIAGNOSIS — R262 Difficulty in walking, not elsewhere classified: Secondary | ICD-10-CM | POA: Diagnosis not present

## 2017-12-03 DIAGNOSIS — G20 Parkinson's disease: Secondary | ICD-10-CM | POA: Diagnosis not present

## 2017-12-03 DIAGNOSIS — R262 Difficulty in walking, not elsewhere classified: Secondary | ICD-10-CM | POA: Diagnosis not present

## 2017-12-03 DIAGNOSIS — S0990XD Unspecified injury of head, subsequent encounter: Secondary | ICD-10-CM | POA: Diagnosis not present

## 2017-12-03 DIAGNOSIS — I10 Essential (primary) hypertension: Secondary | ICD-10-CM | POA: Diagnosis not present

## 2017-12-03 DIAGNOSIS — G2581 Restless legs syndrome: Secondary | ICD-10-CM | POA: Diagnosis not present

## 2017-12-03 DIAGNOSIS — R69 Illness, unspecified: Secondary | ICD-10-CM | POA: Diagnosis not present

## 2017-12-03 DIAGNOSIS — I739 Peripheral vascular disease, unspecified: Secondary | ICD-10-CM | POA: Diagnosis not present

## 2017-12-03 DIAGNOSIS — M170 Bilateral primary osteoarthritis of knee: Secondary | ICD-10-CM | POA: Diagnosis not present

## 2017-12-03 DIAGNOSIS — I2510 Atherosclerotic heart disease of native coronary artery without angina pectoris: Secondary | ICD-10-CM | POA: Diagnosis not present

## 2017-12-03 DIAGNOSIS — S2222XD Fracture of body of sternum, subsequent encounter for fracture with routine healing: Secondary | ICD-10-CM | POA: Diagnosis not present

## 2017-12-06 DIAGNOSIS — S0990XD Unspecified injury of head, subsequent encounter: Secondary | ICD-10-CM | POA: Diagnosis not present

## 2017-12-06 DIAGNOSIS — M170 Bilateral primary osteoarthritis of knee: Secondary | ICD-10-CM | POA: Diagnosis not present

## 2017-12-06 DIAGNOSIS — I739 Peripheral vascular disease, unspecified: Secondary | ICD-10-CM | POA: Diagnosis not present

## 2017-12-06 DIAGNOSIS — G20 Parkinson's disease: Secondary | ICD-10-CM | POA: Diagnosis not present

## 2017-12-06 DIAGNOSIS — I10 Essential (primary) hypertension: Secondary | ICD-10-CM | POA: Diagnosis not present

## 2017-12-06 DIAGNOSIS — R69 Illness, unspecified: Secondary | ICD-10-CM | POA: Diagnosis not present

## 2017-12-06 DIAGNOSIS — G2581 Restless legs syndrome: Secondary | ICD-10-CM | POA: Diagnosis not present

## 2017-12-06 DIAGNOSIS — R262 Difficulty in walking, not elsewhere classified: Secondary | ICD-10-CM | POA: Diagnosis not present

## 2017-12-06 DIAGNOSIS — I2510 Atherosclerotic heart disease of native coronary artery without angina pectoris: Secondary | ICD-10-CM | POA: Diagnosis not present

## 2017-12-06 DIAGNOSIS — S2222XD Fracture of body of sternum, subsequent encounter for fracture with routine healing: Secondary | ICD-10-CM | POA: Diagnosis not present

## 2017-12-08 DIAGNOSIS — M170 Bilateral primary osteoarthritis of knee: Secondary | ICD-10-CM | POA: Diagnosis not present

## 2017-12-08 DIAGNOSIS — R262 Difficulty in walking, not elsewhere classified: Secondary | ICD-10-CM | POA: Diagnosis not present

## 2017-12-08 DIAGNOSIS — G2581 Restless legs syndrome: Secondary | ICD-10-CM | POA: Diagnosis not present

## 2017-12-08 DIAGNOSIS — G20 Parkinson's disease: Secondary | ICD-10-CM | POA: Diagnosis not present

## 2017-12-08 DIAGNOSIS — S2222XD Fracture of body of sternum, subsequent encounter for fracture with routine healing: Secondary | ICD-10-CM | POA: Diagnosis not present

## 2017-12-08 DIAGNOSIS — I10 Essential (primary) hypertension: Secondary | ICD-10-CM | POA: Diagnosis not present

## 2017-12-08 DIAGNOSIS — I2510 Atherosclerotic heart disease of native coronary artery without angina pectoris: Secondary | ICD-10-CM | POA: Diagnosis not present

## 2017-12-08 DIAGNOSIS — I739 Peripheral vascular disease, unspecified: Secondary | ICD-10-CM | POA: Diagnosis not present

## 2017-12-08 DIAGNOSIS — S0990XD Unspecified injury of head, subsequent encounter: Secondary | ICD-10-CM | POA: Diagnosis not present

## 2017-12-08 DIAGNOSIS — R69 Illness, unspecified: Secondary | ICD-10-CM | POA: Diagnosis not present

## 2017-12-09 DIAGNOSIS — R69 Illness, unspecified: Secondary | ICD-10-CM | POA: Diagnosis not present

## 2017-12-09 DIAGNOSIS — S0990XD Unspecified injury of head, subsequent encounter: Secondary | ICD-10-CM | POA: Diagnosis not present

## 2017-12-09 DIAGNOSIS — M170 Bilateral primary osteoarthritis of knee: Secondary | ICD-10-CM | POA: Diagnosis not present

## 2017-12-09 DIAGNOSIS — G20 Parkinson's disease: Secondary | ICD-10-CM | POA: Diagnosis not present

## 2017-12-09 DIAGNOSIS — S2222XD Fracture of body of sternum, subsequent encounter for fracture with routine healing: Secondary | ICD-10-CM | POA: Diagnosis not present

## 2017-12-09 DIAGNOSIS — I10 Essential (primary) hypertension: Secondary | ICD-10-CM | POA: Diagnosis not present

## 2017-12-09 DIAGNOSIS — I739 Peripheral vascular disease, unspecified: Secondary | ICD-10-CM | POA: Diagnosis not present

## 2017-12-09 DIAGNOSIS — R262 Difficulty in walking, not elsewhere classified: Secondary | ICD-10-CM | POA: Diagnosis not present

## 2017-12-09 DIAGNOSIS — G2581 Restless legs syndrome: Secondary | ICD-10-CM | POA: Diagnosis not present

## 2017-12-09 DIAGNOSIS — I2510 Atherosclerotic heart disease of native coronary artery without angina pectoris: Secondary | ICD-10-CM | POA: Diagnosis not present

## 2017-12-12 DIAGNOSIS — I2510 Atherosclerotic heart disease of native coronary artery without angina pectoris: Secondary | ICD-10-CM | POA: Diagnosis not present

## 2017-12-12 DIAGNOSIS — M170 Bilateral primary osteoarthritis of knee: Secondary | ICD-10-CM | POA: Diagnosis not present

## 2017-12-12 DIAGNOSIS — R69 Illness, unspecified: Secondary | ICD-10-CM | POA: Diagnosis not present

## 2017-12-12 DIAGNOSIS — I739 Peripheral vascular disease, unspecified: Secondary | ICD-10-CM | POA: Diagnosis not present

## 2017-12-12 DIAGNOSIS — G2581 Restless legs syndrome: Secondary | ICD-10-CM | POA: Diagnosis not present

## 2017-12-12 DIAGNOSIS — S2222XD Fracture of body of sternum, subsequent encounter for fracture with routine healing: Secondary | ICD-10-CM | POA: Diagnosis not present

## 2017-12-12 DIAGNOSIS — S0990XD Unspecified injury of head, subsequent encounter: Secondary | ICD-10-CM | POA: Diagnosis not present

## 2017-12-12 DIAGNOSIS — R262 Difficulty in walking, not elsewhere classified: Secondary | ICD-10-CM | POA: Diagnosis not present

## 2017-12-12 DIAGNOSIS — I10 Essential (primary) hypertension: Secondary | ICD-10-CM | POA: Diagnosis not present

## 2017-12-12 DIAGNOSIS — G20 Parkinson's disease: Secondary | ICD-10-CM | POA: Diagnosis not present

## 2017-12-13 DIAGNOSIS — G2581 Restless legs syndrome: Secondary | ICD-10-CM | POA: Diagnosis not present

## 2017-12-13 DIAGNOSIS — I739 Peripheral vascular disease, unspecified: Secondary | ICD-10-CM | POA: Diagnosis not present

## 2017-12-13 DIAGNOSIS — S0990XD Unspecified injury of head, subsequent encounter: Secondary | ICD-10-CM | POA: Diagnosis not present

## 2017-12-13 DIAGNOSIS — S2222XD Fracture of body of sternum, subsequent encounter for fracture with routine healing: Secondary | ICD-10-CM | POA: Diagnosis not present

## 2017-12-13 DIAGNOSIS — R69 Illness, unspecified: Secondary | ICD-10-CM | POA: Diagnosis not present

## 2017-12-13 DIAGNOSIS — G20 Parkinson's disease: Secondary | ICD-10-CM | POA: Diagnosis not present

## 2017-12-13 DIAGNOSIS — M170 Bilateral primary osteoarthritis of knee: Secondary | ICD-10-CM | POA: Diagnosis not present

## 2017-12-13 DIAGNOSIS — R262 Difficulty in walking, not elsewhere classified: Secondary | ICD-10-CM | POA: Diagnosis not present

## 2017-12-13 DIAGNOSIS — I2510 Atherosclerotic heart disease of native coronary artery without angina pectoris: Secondary | ICD-10-CM | POA: Diagnosis not present

## 2017-12-13 DIAGNOSIS — I10 Essential (primary) hypertension: Secondary | ICD-10-CM | POA: Diagnosis not present

## 2017-12-14 DIAGNOSIS — R5381 Other malaise: Secondary | ICD-10-CM | POA: Diagnosis not present

## 2017-12-14 DIAGNOSIS — Z79899 Other long term (current) drug therapy: Secondary | ICD-10-CM | POA: Diagnosis not present

## 2017-12-14 DIAGNOSIS — I10 Essential (primary) hypertension: Secondary | ICD-10-CM | POA: Diagnosis not present

## 2017-12-16 DIAGNOSIS — I10 Essential (primary) hypertension: Secondary | ICD-10-CM | POA: Diagnosis not present

## 2017-12-16 DIAGNOSIS — I739 Peripheral vascular disease, unspecified: Secondary | ICD-10-CM | POA: Diagnosis not present

## 2017-12-16 DIAGNOSIS — R69 Illness, unspecified: Secondary | ICD-10-CM | POA: Diagnosis not present

## 2017-12-16 DIAGNOSIS — G2581 Restless legs syndrome: Secondary | ICD-10-CM | POA: Diagnosis not present

## 2017-12-16 DIAGNOSIS — R262 Difficulty in walking, not elsewhere classified: Secondary | ICD-10-CM | POA: Diagnosis not present

## 2017-12-16 DIAGNOSIS — G20 Parkinson's disease: Secondary | ICD-10-CM | POA: Diagnosis not present

## 2017-12-16 DIAGNOSIS — I2510 Atherosclerotic heart disease of native coronary artery without angina pectoris: Secondary | ICD-10-CM | POA: Diagnosis not present

## 2017-12-16 DIAGNOSIS — S0990XD Unspecified injury of head, subsequent encounter: Secondary | ICD-10-CM | POA: Diagnosis not present

## 2017-12-16 DIAGNOSIS — S2222XD Fracture of body of sternum, subsequent encounter for fracture with routine healing: Secondary | ICD-10-CM | POA: Diagnosis not present

## 2017-12-16 DIAGNOSIS — M170 Bilateral primary osteoarthritis of knee: Secondary | ICD-10-CM | POA: Diagnosis not present

## 2017-12-21 DIAGNOSIS — M170 Bilateral primary osteoarthritis of knee: Secondary | ICD-10-CM | POA: Diagnosis not present

## 2017-12-21 DIAGNOSIS — I2510 Atherosclerotic heart disease of native coronary artery without angina pectoris: Secondary | ICD-10-CM | POA: Diagnosis not present

## 2017-12-21 DIAGNOSIS — G2581 Restless legs syndrome: Secondary | ICD-10-CM | POA: Diagnosis not present

## 2017-12-21 DIAGNOSIS — I739 Peripheral vascular disease, unspecified: Secondary | ICD-10-CM | POA: Diagnosis not present

## 2017-12-21 DIAGNOSIS — S2222XD Fracture of body of sternum, subsequent encounter for fracture with routine healing: Secondary | ICD-10-CM | POA: Diagnosis not present

## 2017-12-21 DIAGNOSIS — G20 Parkinson's disease: Secondary | ICD-10-CM | POA: Diagnosis not present

## 2017-12-21 DIAGNOSIS — I10 Essential (primary) hypertension: Secondary | ICD-10-CM | POA: Diagnosis not present

## 2017-12-21 DIAGNOSIS — R69 Illness, unspecified: Secondary | ICD-10-CM | POA: Diagnosis not present

## 2017-12-21 DIAGNOSIS — R262 Difficulty in walking, not elsewhere classified: Secondary | ICD-10-CM | POA: Diagnosis not present

## 2017-12-21 DIAGNOSIS — S0990XD Unspecified injury of head, subsequent encounter: Secondary | ICD-10-CM | POA: Diagnosis not present

## 2017-12-22 DIAGNOSIS — G2581 Restless legs syndrome: Secondary | ICD-10-CM | POA: Diagnosis not present

## 2017-12-22 DIAGNOSIS — I2510 Atherosclerotic heart disease of native coronary artery without angina pectoris: Secondary | ICD-10-CM | POA: Diagnosis not present

## 2017-12-22 DIAGNOSIS — I10 Essential (primary) hypertension: Secondary | ICD-10-CM | POA: Diagnosis not present

## 2017-12-22 DIAGNOSIS — I739 Peripheral vascular disease, unspecified: Secondary | ICD-10-CM | POA: Diagnosis not present

## 2017-12-22 DIAGNOSIS — S2222XD Fracture of body of sternum, subsequent encounter for fracture with routine healing: Secondary | ICD-10-CM | POA: Diagnosis not present

## 2017-12-22 DIAGNOSIS — R262 Difficulty in walking, not elsewhere classified: Secondary | ICD-10-CM | POA: Diagnosis not present

## 2017-12-22 DIAGNOSIS — S0990XD Unspecified injury of head, subsequent encounter: Secondary | ICD-10-CM | POA: Diagnosis not present

## 2017-12-22 DIAGNOSIS — R69 Illness, unspecified: Secondary | ICD-10-CM | POA: Diagnosis not present

## 2017-12-22 DIAGNOSIS — M170 Bilateral primary osteoarthritis of knee: Secondary | ICD-10-CM | POA: Diagnosis not present

## 2017-12-22 DIAGNOSIS — G20 Parkinson's disease: Secondary | ICD-10-CM | POA: Diagnosis not present

## 2017-12-23 DIAGNOSIS — I2510 Atherosclerotic heart disease of native coronary artery without angina pectoris: Secondary | ICD-10-CM | POA: Diagnosis not present

## 2017-12-23 DIAGNOSIS — M170 Bilateral primary osteoarthritis of knee: Secondary | ICD-10-CM | POA: Diagnosis not present

## 2017-12-23 DIAGNOSIS — G2581 Restless legs syndrome: Secondary | ICD-10-CM | POA: Diagnosis not present

## 2017-12-23 DIAGNOSIS — R69 Illness, unspecified: Secondary | ICD-10-CM | POA: Diagnosis not present

## 2017-12-23 DIAGNOSIS — S0990XD Unspecified injury of head, subsequent encounter: Secondary | ICD-10-CM | POA: Diagnosis not present

## 2017-12-23 DIAGNOSIS — R262 Difficulty in walking, not elsewhere classified: Secondary | ICD-10-CM | POA: Diagnosis not present

## 2017-12-23 DIAGNOSIS — S2222XD Fracture of body of sternum, subsequent encounter for fracture with routine healing: Secondary | ICD-10-CM | POA: Diagnosis not present

## 2017-12-23 DIAGNOSIS — G20 Parkinson's disease: Secondary | ICD-10-CM | POA: Diagnosis not present

## 2017-12-23 DIAGNOSIS — I10 Essential (primary) hypertension: Secondary | ICD-10-CM | POA: Diagnosis not present

## 2017-12-23 DIAGNOSIS — I739 Peripheral vascular disease, unspecified: Secondary | ICD-10-CM | POA: Diagnosis not present

## 2017-12-26 DIAGNOSIS — I10 Essential (primary) hypertension: Secondary | ICD-10-CM | POA: Diagnosis not present

## 2017-12-26 DIAGNOSIS — S2222XD Fracture of body of sternum, subsequent encounter for fracture with routine healing: Secondary | ICD-10-CM | POA: Diagnosis not present

## 2017-12-26 DIAGNOSIS — G2581 Restless legs syndrome: Secondary | ICD-10-CM | POA: Diagnosis not present

## 2017-12-26 DIAGNOSIS — G20 Parkinson's disease: Secondary | ICD-10-CM | POA: Diagnosis not present

## 2017-12-26 DIAGNOSIS — S0990XD Unspecified injury of head, subsequent encounter: Secondary | ICD-10-CM | POA: Diagnosis not present

## 2017-12-26 DIAGNOSIS — I2510 Atherosclerotic heart disease of native coronary artery without angina pectoris: Secondary | ICD-10-CM | POA: Diagnosis not present

## 2017-12-26 DIAGNOSIS — R262 Difficulty in walking, not elsewhere classified: Secondary | ICD-10-CM | POA: Diagnosis not present

## 2017-12-26 DIAGNOSIS — M170 Bilateral primary osteoarthritis of knee: Secondary | ICD-10-CM | POA: Diagnosis not present

## 2017-12-26 DIAGNOSIS — R69 Illness, unspecified: Secondary | ICD-10-CM | POA: Diagnosis not present

## 2017-12-26 DIAGNOSIS — I739 Peripheral vascular disease, unspecified: Secondary | ICD-10-CM | POA: Diagnosis not present

## 2017-12-28 DIAGNOSIS — I739 Peripheral vascular disease, unspecified: Secondary | ICD-10-CM | POA: Diagnosis not present

## 2017-12-28 DIAGNOSIS — I2510 Atherosclerotic heart disease of native coronary artery without angina pectoris: Secondary | ICD-10-CM | POA: Diagnosis not present

## 2017-12-28 DIAGNOSIS — R262 Difficulty in walking, not elsewhere classified: Secondary | ICD-10-CM | POA: Diagnosis not present

## 2017-12-28 DIAGNOSIS — S0990XD Unspecified injury of head, subsequent encounter: Secondary | ICD-10-CM | POA: Diagnosis not present

## 2017-12-28 DIAGNOSIS — M170 Bilateral primary osteoarthritis of knee: Secondary | ICD-10-CM | POA: Diagnosis not present

## 2017-12-28 DIAGNOSIS — S2222XD Fracture of body of sternum, subsequent encounter for fracture with routine healing: Secondary | ICD-10-CM | POA: Diagnosis not present

## 2017-12-28 DIAGNOSIS — G2581 Restless legs syndrome: Secondary | ICD-10-CM | POA: Diagnosis not present

## 2017-12-28 DIAGNOSIS — I10 Essential (primary) hypertension: Secondary | ICD-10-CM | POA: Diagnosis not present

## 2017-12-28 DIAGNOSIS — G20 Parkinson's disease: Secondary | ICD-10-CM | POA: Diagnosis not present

## 2017-12-28 DIAGNOSIS — R69 Illness, unspecified: Secondary | ICD-10-CM | POA: Diagnosis not present

## 2017-12-29 DIAGNOSIS — N3941 Urge incontinence: Secondary | ICD-10-CM | POA: Diagnosis not present

## 2017-12-29 DIAGNOSIS — L97511 Non-pressure chronic ulcer of other part of right foot limited to breakdown of skin: Secondary | ICD-10-CM | POA: Diagnosis not present

## 2017-12-29 DIAGNOSIS — M79674 Pain in right toe(s): Secondary | ICD-10-CM | POA: Diagnosis not present

## 2017-12-29 DIAGNOSIS — S2222XD Fracture of body of sternum, subsequent encounter for fracture with routine healing: Secondary | ICD-10-CM | POA: Diagnosis not present

## 2017-12-29 DIAGNOSIS — M159 Polyosteoarthritis, unspecified: Secondary | ICD-10-CM | POA: Diagnosis not present

## 2017-12-29 DIAGNOSIS — E042 Nontoxic multinodular goiter: Secondary | ICD-10-CM | POA: Diagnosis not present

## 2017-12-29 DIAGNOSIS — G20 Parkinson's disease: Secondary | ICD-10-CM | POA: Diagnosis not present

## 2017-12-29 DIAGNOSIS — G8929 Other chronic pain: Secondary | ICD-10-CM | POA: Diagnosis not present

## 2017-12-30 DIAGNOSIS — G20 Parkinson's disease: Secondary | ICD-10-CM | POA: Diagnosis not present

## 2017-12-30 DIAGNOSIS — S0990XD Unspecified injury of head, subsequent encounter: Secondary | ICD-10-CM | POA: Diagnosis not present

## 2017-12-30 DIAGNOSIS — R262 Difficulty in walking, not elsewhere classified: Secondary | ICD-10-CM | POA: Diagnosis not present

## 2017-12-30 DIAGNOSIS — R69 Illness, unspecified: Secondary | ICD-10-CM | POA: Diagnosis not present

## 2017-12-30 DIAGNOSIS — I739 Peripheral vascular disease, unspecified: Secondary | ICD-10-CM | POA: Diagnosis not present

## 2017-12-30 DIAGNOSIS — M170 Bilateral primary osteoarthritis of knee: Secondary | ICD-10-CM | POA: Diagnosis not present

## 2017-12-30 DIAGNOSIS — I10 Essential (primary) hypertension: Secondary | ICD-10-CM | POA: Diagnosis not present

## 2017-12-30 DIAGNOSIS — I2510 Atherosclerotic heart disease of native coronary artery without angina pectoris: Secondary | ICD-10-CM | POA: Diagnosis not present

## 2017-12-30 DIAGNOSIS — S2222XD Fracture of body of sternum, subsequent encounter for fracture with routine healing: Secondary | ICD-10-CM | POA: Diagnosis not present

## 2017-12-30 DIAGNOSIS — G2581 Restless legs syndrome: Secondary | ICD-10-CM | POA: Diagnosis not present

## 2018-01-02 DIAGNOSIS — G2581 Restless legs syndrome: Secondary | ICD-10-CM | POA: Diagnosis not present

## 2018-01-02 DIAGNOSIS — I2510 Atherosclerotic heart disease of native coronary artery without angina pectoris: Secondary | ICD-10-CM | POA: Diagnosis not present

## 2018-01-02 DIAGNOSIS — I10 Essential (primary) hypertension: Secondary | ICD-10-CM | POA: Diagnosis not present

## 2018-01-02 DIAGNOSIS — R262 Difficulty in walking, not elsewhere classified: Secondary | ICD-10-CM | POA: Diagnosis not present

## 2018-01-02 DIAGNOSIS — M170 Bilateral primary osteoarthritis of knee: Secondary | ICD-10-CM | POA: Diagnosis not present

## 2018-01-02 DIAGNOSIS — S2222XD Fracture of body of sternum, subsequent encounter for fracture with routine healing: Secondary | ICD-10-CM | POA: Diagnosis not present

## 2018-01-02 DIAGNOSIS — G20 Parkinson's disease: Secondary | ICD-10-CM | POA: Diagnosis not present

## 2018-01-02 DIAGNOSIS — R69 Illness, unspecified: Secondary | ICD-10-CM | POA: Diagnosis not present

## 2018-01-02 DIAGNOSIS — S0990XD Unspecified injury of head, subsequent encounter: Secondary | ICD-10-CM | POA: Diagnosis not present

## 2018-01-02 DIAGNOSIS — I739 Peripheral vascular disease, unspecified: Secondary | ICD-10-CM | POA: Diagnosis not present

## 2018-01-03 DIAGNOSIS — S2222XD Fracture of body of sternum, subsequent encounter for fracture with routine healing: Secondary | ICD-10-CM | POA: Diagnosis not present

## 2018-01-03 DIAGNOSIS — R69 Illness, unspecified: Secondary | ICD-10-CM | POA: Diagnosis not present

## 2018-01-03 DIAGNOSIS — S0990XD Unspecified injury of head, subsequent encounter: Secondary | ICD-10-CM | POA: Diagnosis not present

## 2018-01-03 DIAGNOSIS — M170 Bilateral primary osteoarthritis of knee: Secondary | ICD-10-CM | POA: Diagnosis not present

## 2018-01-03 DIAGNOSIS — Z789 Other specified health status: Secondary | ICD-10-CM | POA: Diagnosis not present

## 2018-01-03 DIAGNOSIS — I10 Essential (primary) hypertension: Secondary | ICD-10-CM | POA: Diagnosis not present

## 2018-01-03 DIAGNOSIS — R262 Difficulty in walking, not elsewhere classified: Secondary | ICD-10-CM | POA: Diagnosis not present

## 2018-01-03 DIAGNOSIS — I739 Peripheral vascular disease, unspecified: Secondary | ICD-10-CM | POA: Diagnosis not present

## 2018-01-03 DIAGNOSIS — I2510 Atherosclerotic heart disease of native coronary artery without angina pectoris: Secondary | ICD-10-CM | POA: Diagnosis not present

## 2018-01-03 DIAGNOSIS — G2581 Restless legs syndrome: Secondary | ICD-10-CM | POA: Diagnosis not present

## 2018-01-03 DIAGNOSIS — R634 Abnormal weight loss: Secondary | ICD-10-CM | POA: Diagnosis not present

## 2018-01-03 DIAGNOSIS — G20 Parkinson's disease: Secondary | ICD-10-CM | POA: Diagnosis not present

## 2018-01-05 DIAGNOSIS — I2510 Atherosclerotic heart disease of native coronary artery without angina pectoris: Secondary | ICD-10-CM | POA: Diagnosis not present

## 2018-01-05 DIAGNOSIS — I10 Essential (primary) hypertension: Secondary | ICD-10-CM | POA: Diagnosis not present

## 2018-01-05 DIAGNOSIS — R262 Difficulty in walking, not elsewhere classified: Secondary | ICD-10-CM | POA: Diagnosis not present

## 2018-01-05 DIAGNOSIS — S0990XD Unspecified injury of head, subsequent encounter: Secondary | ICD-10-CM | POA: Diagnosis not present

## 2018-01-05 DIAGNOSIS — R69 Illness, unspecified: Secondary | ICD-10-CM | POA: Diagnosis not present

## 2018-01-05 DIAGNOSIS — M170 Bilateral primary osteoarthritis of knee: Secondary | ICD-10-CM | POA: Diagnosis not present

## 2018-01-05 DIAGNOSIS — G2581 Restless legs syndrome: Secondary | ICD-10-CM | POA: Diagnosis not present

## 2018-01-05 DIAGNOSIS — G20 Parkinson's disease: Secondary | ICD-10-CM | POA: Diagnosis not present

## 2018-01-05 DIAGNOSIS — S2222XD Fracture of body of sternum, subsequent encounter for fracture with routine healing: Secondary | ICD-10-CM | POA: Diagnosis not present

## 2018-01-05 DIAGNOSIS — I739 Peripheral vascular disease, unspecified: Secondary | ICD-10-CM | POA: Diagnosis not present

## 2018-01-06 DIAGNOSIS — R69 Illness, unspecified: Secondary | ICD-10-CM | POA: Diagnosis not present

## 2018-01-06 DIAGNOSIS — I2510 Atherosclerotic heart disease of native coronary artery without angina pectoris: Secondary | ICD-10-CM | POA: Diagnosis not present

## 2018-01-06 DIAGNOSIS — S0990XD Unspecified injury of head, subsequent encounter: Secondary | ICD-10-CM | POA: Diagnosis not present

## 2018-01-06 DIAGNOSIS — S2222XD Fracture of body of sternum, subsequent encounter for fracture with routine healing: Secondary | ICD-10-CM | POA: Diagnosis not present

## 2018-01-06 DIAGNOSIS — I739 Peripheral vascular disease, unspecified: Secondary | ICD-10-CM | POA: Diagnosis not present

## 2018-01-06 DIAGNOSIS — I10 Essential (primary) hypertension: Secondary | ICD-10-CM | POA: Diagnosis not present

## 2018-01-06 DIAGNOSIS — M170 Bilateral primary osteoarthritis of knee: Secondary | ICD-10-CM | POA: Diagnosis not present

## 2018-01-06 DIAGNOSIS — G2581 Restless legs syndrome: Secondary | ICD-10-CM | POA: Diagnosis not present

## 2018-01-06 DIAGNOSIS — R262 Difficulty in walking, not elsewhere classified: Secondary | ICD-10-CM | POA: Diagnosis not present

## 2018-01-06 DIAGNOSIS — G20 Parkinson's disease: Secondary | ICD-10-CM | POA: Diagnosis not present

## 2018-01-09 DIAGNOSIS — I2510 Atherosclerotic heart disease of native coronary artery without angina pectoris: Secondary | ICD-10-CM | POA: Diagnosis not present

## 2018-01-09 DIAGNOSIS — S0990XD Unspecified injury of head, subsequent encounter: Secondary | ICD-10-CM | POA: Diagnosis not present

## 2018-01-09 DIAGNOSIS — M170 Bilateral primary osteoarthritis of knee: Secondary | ICD-10-CM | POA: Diagnosis not present

## 2018-01-09 DIAGNOSIS — I10 Essential (primary) hypertension: Secondary | ICD-10-CM | POA: Diagnosis not present

## 2018-01-09 DIAGNOSIS — S2222XD Fracture of body of sternum, subsequent encounter for fracture with routine healing: Secondary | ICD-10-CM | POA: Diagnosis not present

## 2018-01-09 DIAGNOSIS — G2581 Restless legs syndrome: Secondary | ICD-10-CM | POA: Diagnosis not present

## 2018-01-09 DIAGNOSIS — I739 Peripheral vascular disease, unspecified: Secondary | ICD-10-CM | POA: Diagnosis not present

## 2018-01-09 DIAGNOSIS — R262 Difficulty in walking, not elsewhere classified: Secondary | ICD-10-CM | POA: Diagnosis not present

## 2018-01-09 DIAGNOSIS — G20 Parkinson's disease: Secondary | ICD-10-CM | POA: Diagnosis not present

## 2018-01-09 DIAGNOSIS — R69 Illness, unspecified: Secondary | ICD-10-CM | POA: Diagnosis not present

## 2018-01-11 ENCOUNTER — Ambulatory Visit: Payer: 59 | Admitting: Family Medicine

## 2018-01-12 DIAGNOSIS — G20 Parkinson's disease: Secondary | ICD-10-CM | POA: Diagnosis not present

## 2018-01-12 DIAGNOSIS — R262 Difficulty in walking, not elsewhere classified: Secondary | ICD-10-CM | POA: Diagnosis not present

## 2018-01-12 DIAGNOSIS — R69 Illness, unspecified: Secondary | ICD-10-CM | POA: Diagnosis not present

## 2018-01-12 DIAGNOSIS — M170 Bilateral primary osteoarthritis of knee: Secondary | ICD-10-CM | POA: Diagnosis not present

## 2018-01-12 DIAGNOSIS — S0990XD Unspecified injury of head, subsequent encounter: Secondary | ICD-10-CM | POA: Diagnosis not present

## 2018-01-12 DIAGNOSIS — I2510 Atherosclerotic heart disease of native coronary artery without angina pectoris: Secondary | ICD-10-CM | POA: Diagnosis not present

## 2018-01-12 DIAGNOSIS — G2581 Restless legs syndrome: Secondary | ICD-10-CM | POA: Diagnosis not present

## 2018-01-12 DIAGNOSIS — S2222XD Fracture of body of sternum, subsequent encounter for fracture with routine healing: Secondary | ICD-10-CM | POA: Diagnosis not present

## 2018-01-12 DIAGNOSIS — I10 Essential (primary) hypertension: Secondary | ICD-10-CM | POA: Diagnosis not present

## 2018-01-12 DIAGNOSIS — I739 Peripheral vascular disease, unspecified: Secondary | ICD-10-CM | POA: Diagnosis not present

## 2018-01-16 DIAGNOSIS — R69 Illness, unspecified: Secondary | ICD-10-CM | POA: Diagnosis not present

## 2018-01-16 DIAGNOSIS — S2222XD Fracture of body of sternum, subsequent encounter for fracture with routine healing: Secondary | ICD-10-CM | POA: Diagnosis not present

## 2018-01-16 DIAGNOSIS — G2581 Restless legs syndrome: Secondary | ICD-10-CM | POA: Diagnosis not present

## 2018-01-16 DIAGNOSIS — I10 Essential (primary) hypertension: Secondary | ICD-10-CM | POA: Diagnosis not present

## 2018-01-16 DIAGNOSIS — M170 Bilateral primary osteoarthritis of knee: Secondary | ICD-10-CM | POA: Diagnosis not present

## 2018-01-16 DIAGNOSIS — S0990XD Unspecified injury of head, subsequent encounter: Secondary | ICD-10-CM | POA: Diagnosis not present

## 2018-01-16 DIAGNOSIS — I739 Peripheral vascular disease, unspecified: Secondary | ICD-10-CM | POA: Diagnosis not present

## 2018-01-16 DIAGNOSIS — G20 Parkinson's disease: Secondary | ICD-10-CM | POA: Diagnosis not present

## 2018-01-16 DIAGNOSIS — R262 Difficulty in walking, not elsewhere classified: Secondary | ICD-10-CM | POA: Diagnosis not present

## 2018-01-16 DIAGNOSIS — I2510 Atherosclerotic heart disease of native coronary artery without angina pectoris: Secondary | ICD-10-CM | POA: Diagnosis not present

## 2018-01-18 DIAGNOSIS — M170 Bilateral primary osteoarthritis of knee: Secondary | ICD-10-CM | POA: Diagnosis not present

## 2018-01-18 DIAGNOSIS — I10 Essential (primary) hypertension: Secondary | ICD-10-CM | POA: Diagnosis not present

## 2018-01-18 DIAGNOSIS — S2222XD Fracture of body of sternum, subsequent encounter for fracture with routine healing: Secondary | ICD-10-CM | POA: Diagnosis not present

## 2018-01-18 DIAGNOSIS — G2581 Restless legs syndrome: Secondary | ICD-10-CM | POA: Diagnosis not present

## 2018-01-18 DIAGNOSIS — R69 Illness, unspecified: Secondary | ICD-10-CM | POA: Diagnosis not present

## 2018-01-18 DIAGNOSIS — R262 Difficulty in walking, not elsewhere classified: Secondary | ICD-10-CM | POA: Diagnosis not present

## 2018-01-18 DIAGNOSIS — I739 Peripheral vascular disease, unspecified: Secondary | ICD-10-CM | POA: Diagnosis not present

## 2018-01-18 DIAGNOSIS — S0990XD Unspecified injury of head, subsequent encounter: Secondary | ICD-10-CM | POA: Diagnosis not present

## 2018-01-18 DIAGNOSIS — G20 Parkinson's disease: Secondary | ICD-10-CM | POA: Diagnosis not present

## 2018-01-18 DIAGNOSIS — I2510 Atherosclerotic heart disease of native coronary artery without angina pectoris: Secondary | ICD-10-CM | POA: Diagnosis not present

## 2018-01-23 DIAGNOSIS — S2222XD Fracture of body of sternum, subsequent encounter for fracture with routine healing: Secondary | ICD-10-CM | POA: Diagnosis not present

## 2018-01-23 DIAGNOSIS — G20 Parkinson's disease: Secondary | ICD-10-CM | POA: Diagnosis not present

## 2018-01-23 DIAGNOSIS — R69 Illness, unspecified: Secondary | ICD-10-CM | POA: Diagnosis not present

## 2018-01-23 DIAGNOSIS — M170 Bilateral primary osteoarthritis of knee: Secondary | ICD-10-CM | POA: Diagnosis not present

## 2018-01-23 DIAGNOSIS — I739 Peripheral vascular disease, unspecified: Secondary | ICD-10-CM | POA: Diagnosis not present

## 2018-01-23 DIAGNOSIS — I2510 Atherosclerotic heart disease of native coronary artery without angina pectoris: Secondary | ICD-10-CM | POA: Diagnosis not present

## 2018-01-23 DIAGNOSIS — G2581 Restless legs syndrome: Secondary | ICD-10-CM | POA: Diagnosis not present

## 2018-01-23 DIAGNOSIS — I10 Essential (primary) hypertension: Secondary | ICD-10-CM | POA: Diagnosis not present

## 2018-01-23 DIAGNOSIS — S0990XD Unspecified injury of head, subsequent encounter: Secondary | ICD-10-CM | POA: Diagnosis not present

## 2018-01-23 DIAGNOSIS — R262 Difficulty in walking, not elsewhere classified: Secondary | ICD-10-CM | POA: Diagnosis not present

## 2018-01-25 DIAGNOSIS — G20 Parkinson's disease: Secondary | ICD-10-CM | POA: Diagnosis not present

## 2018-01-25 DIAGNOSIS — Z862 Personal history of diseases of the blood and blood-forming organs and certain disorders involving the immune mechanism: Secondary | ICD-10-CM | POA: Diagnosis not present

## 2018-01-25 DIAGNOSIS — I10 Essential (primary) hypertension: Secondary | ICD-10-CM | POA: Diagnosis not present

## 2018-01-25 DIAGNOSIS — R69 Illness, unspecified: Secondary | ICD-10-CM | POA: Diagnosis not present

## 2018-01-29 DIAGNOSIS — M159 Polyosteoarthritis, unspecified: Secondary | ICD-10-CM | POA: Diagnosis not present

## 2018-01-29 DIAGNOSIS — L97511 Non-pressure chronic ulcer of other part of right foot limited to breakdown of skin: Secondary | ICD-10-CM | POA: Diagnosis not present

## 2018-01-29 DIAGNOSIS — N3941 Urge incontinence: Secondary | ICD-10-CM | POA: Diagnosis not present

## 2018-01-29 DIAGNOSIS — E042 Nontoxic multinodular goiter: Secondary | ICD-10-CM | POA: Diagnosis not present

## 2018-01-29 DIAGNOSIS — G20 Parkinson's disease: Secondary | ICD-10-CM | POA: Diagnosis not present

## 2018-01-29 DIAGNOSIS — S2222XD Fracture of body of sternum, subsequent encounter for fracture with routine healing: Secondary | ICD-10-CM | POA: Diagnosis not present

## 2018-01-29 DIAGNOSIS — M79674 Pain in right toe(s): Secondary | ICD-10-CM | POA: Diagnosis not present

## 2018-01-29 DIAGNOSIS — G8929 Other chronic pain: Secondary | ICD-10-CM | POA: Diagnosis not present

## 2018-02-21 DIAGNOSIS — M1388 Other specified arthritis, other site: Secondary | ICD-10-CM | POA: Diagnosis not present

## 2018-02-22 DIAGNOSIS — G20 Parkinson's disease: Secondary | ICD-10-CM | POA: Diagnosis not present

## 2018-02-22 DIAGNOSIS — R69 Illness, unspecified: Secondary | ICD-10-CM | POA: Diagnosis not present

## 2018-02-22 DIAGNOSIS — I10 Essential (primary) hypertension: Secondary | ICD-10-CM | POA: Diagnosis not present

## 2018-02-22 DIAGNOSIS — Z862 Personal history of diseases of the blood and blood-forming organs and certain disorders involving the immune mechanism: Secondary | ICD-10-CM | POA: Diagnosis not present

## 2018-02-26 DIAGNOSIS — M79674 Pain in right toe(s): Secondary | ICD-10-CM | POA: Diagnosis not present

## 2018-02-26 DIAGNOSIS — L97511 Non-pressure chronic ulcer of other part of right foot limited to breakdown of skin: Secondary | ICD-10-CM | POA: Diagnosis not present

## 2018-02-26 DIAGNOSIS — G20 Parkinson's disease: Secondary | ICD-10-CM | POA: Diagnosis not present

## 2018-02-26 DIAGNOSIS — G8929 Other chronic pain: Secondary | ICD-10-CM | POA: Diagnosis not present

## 2018-02-26 DIAGNOSIS — E042 Nontoxic multinodular goiter: Secondary | ICD-10-CM | POA: Diagnosis not present

## 2018-02-26 DIAGNOSIS — M159 Polyosteoarthritis, unspecified: Secondary | ICD-10-CM | POA: Diagnosis not present

## 2018-02-26 DIAGNOSIS — N3941 Urge incontinence: Secondary | ICD-10-CM | POA: Diagnosis not present

## 2018-02-26 DIAGNOSIS — S2222XD Fracture of body of sternum, subsequent encounter for fracture with routine healing: Secondary | ICD-10-CM | POA: Diagnosis not present

## 2018-03-01 DIAGNOSIS — Z79899 Other long term (current) drug therapy: Secondary | ICD-10-CM | POA: Diagnosis not present

## 2018-03-01 DIAGNOSIS — I10 Essential (primary) hypertension: Secondary | ICD-10-CM | POA: Diagnosis not present

## 2018-03-01 DIAGNOSIS — R5381 Other malaise: Secondary | ICD-10-CM | POA: Diagnosis not present

## 2018-03-16 DIAGNOSIS — L603 Nail dystrophy: Secondary | ICD-10-CM | POA: Diagnosis not present

## 2018-03-16 DIAGNOSIS — I739 Peripheral vascular disease, unspecified: Secondary | ICD-10-CM | POA: Diagnosis not present

## 2018-03-16 DIAGNOSIS — R262 Difficulty in walking, not elsewhere classified: Secondary | ICD-10-CM | POA: Diagnosis not present

## 2018-03-22 DIAGNOSIS — I10 Essential (primary) hypertension: Secondary | ICD-10-CM | POA: Diagnosis not present

## 2018-03-22 DIAGNOSIS — G20 Parkinson's disease: Secondary | ICD-10-CM | POA: Diagnosis not present

## 2018-03-22 DIAGNOSIS — R69 Illness, unspecified: Secondary | ICD-10-CM | POA: Diagnosis not present

## 2018-03-22 DIAGNOSIS — Z862 Personal history of diseases of the blood and blood-forming organs and certain disorders involving the immune mechanism: Secondary | ICD-10-CM | POA: Diagnosis not present

## 2018-03-28 DIAGNOSIS — R040 Epistaxis: Secondary | ICD-10-CM | POA: Diagnosis not present

## 2018-03-29 DIAGNOSIS — M79674 Pain in right toe(s): Secondary | ICD-10-CM | POA: Diagnosis not present

## 2018-03-29 DIAGNOSIS — M159 Polyosteoarthritis, unspecified: Secondary | ICD-10-CM | POA: Diagnosis not present

## 2018-03-29 DIAGNOSIS — G8929 Other chronic pain: Secondary | ICD-10-CM | POA: Diagnosis not present

## 2018-03-29 DIAGNOSIS — S2222XD Fracture of body of sternum, subsequent encounter for fracture with routine healing: Secondary | ICD-10-CM | POA: Diagnosis not present

## 2018-03-29 DIAGNOSIS — G20 Parkinson's disease: Secondary | ICD-10-CM | POA: Diagnosis not present

## 2018-03-29 DIAGNOSIS — E042 Nontoxic multinodular goiter: Secondary | ICD-10-CM | POA: Diagnosis not present

## 2018-03-29 DIAGNOSIS — L97511 Non-pressure chronic ulcer of other part of right foot limited to breakdown of skin: Secondary | ICD-10-CM | POA: Diagnosis not present

## 2018-03-29 DIAGNOSIS — N3941 Urge incontinence: Secondary | ICD-10-CM | POA: Diagnosis not present

## 2018-04-11 DIAGNOSIS — M1388 Other specified arthritis, other site: Secondary | ICD-10-CM | POA: Diagnosis not present

## 2018-04-19 DIAGNOSIS — Z862 Personal history of diseases of the blood and blood-forming organs and certain disorders involving the immune mechanism: Secondary | ICD-10-CM | POA: Diagnosis not present

## 2018-04-19 DIAGNOSIS — I10 Essential (primary) hypertension: Secondary | ICD-10-CM | POA: Diagnosis not present

## 2018-04-19 DIAGNOSIS — G20 Parkinson's disease: Secondary | ICD-10-CM | POA: Diagnosis not present

## 2018-04-19 DIAGNOSIS — R69 Illness, unspecified: Secondary | ICD-10-CM | POA: Diagnosis not present

## 2018-04-28 DIAGNOSIS — L97511 Non-pressure chronic ulcer of other part of right foot limited to breakdown of skin: Secondary | ICD-10-CM | POA: Diagnosis not present

## 2018-04-28 DIAGNOSIS — N3941 Urge incontinence: Secondary | ICD-10-CM | POA: Diagnosis not present

## 2018-04-28 DIAGNOSIS — E042 Nontoxic multinodular goiter: Secondary | ICD-10-CM | POA: Diagnosis not present

## 2018-04-28 DIAGNOSIS — M79674 Pain in right toe(s): Secondary | ICD-10-CM | POA: Diagnosis not present

## 2018-04-28 DIAGNOSIS — S2222XD Fracture of body of sternum, subsequent encounter for fracture with routine healing: Secondary | ICD-10-CM | POA: Diagnosis not present

## 2018-04-28 DIAGNOSIS — G20 Parkinson's disease: Secondary | ICD-10-CM | POA: Diagnosis not present

## 2018-04-28 DIAGNOSIS — G8929 Other chronic pain: Secondary | ICD-10-CM | POA: Diagnosis not present

## 2018-04-28 DIAGNOSIS — M159 Polyosteoarthritis, unspecified: Secondary | ICD-10-CM | POA: Diagnosis not present

## 2018-05-09 DIAGNOSIS — M170 Bilateral primary osteoarthritis of knee: Secondary | ICD-10-CM | POA: Diagnosis not present

## 2018-05-18 DIAGNOSIS — G20 Parkinson's disease: Secondary | ICD-10-CM | POA: Diagnosis not present

## 2018-05-18 DIAGNOSIS — I10 Essential (primary) hypertension: Secondary | ICD-10-CM | POA: Diagnosis not present

## 2018-05-18 DIAGNOSIS — R69 Illness, unspecified: Secondary | ICD-10-CM | POA: Diagnosis not present

## 2018-05-18 DIAGNOSIS — Z862 Personal history of diseases of the blood and blood-forming organs and certain disorders involving the immune mechanism: Secondary | ICD-10-CM | POA: Diagnosis not present

## 2018-05-23 DIAGNOSIS — N3281 Overactive bladder: Secondary | ICD-10-CM | POA: Diagnosis not present

## 2018-05-23 DIAGNOSIS — I10 Essential (primary) hypertension: Secondary | ICD-10-CM | POA: Diagnosis not present

## 2018-05-23 DIAGNOSIS — M1388 Other specified arthritis, other site: Secondary | ICD-10-CM | POA: Diagnosis not present

## 2018-05-23 DIAGNOSIS — G20 Parkinson's disease: Secondary | ICD-10-CM | POA: Diagnosis not present

## 2018-05-29 DIAGNOSIS — G8929 Other chronic pain: Secondary | ICD-10-CM | POA: Diagnosis not present

## 2018-05-29 DIAGNOSIS — G20 Parkinson's disease: Secondary | ICD-10-CM | POA: Diagnosis not present

## 2018-05-29 DIAGNOSIS — E042 Nontoxic multinodular goiter: Secondary | ICD-10-CM | POA: Diagnosis not present

## 2018-05-29 DIAGNOSIS — N3941 Urge incontinence: Secondary | ICD-10-CM | POA: Diagnosis not present

## 2018-05-29 DIAGNOSIS — S2222XD Fracture of body of sternum, subsequent encounter for fracture with routine healing: Secondary | ICD-10-CM | POA: Diagnosis not present

## 2018-05-29 DIAGNOSIS — M159 Polyosteoarthritis, unspecified: Secondary | ICD-10-CM | POA: Diagnosis not present

## 2018-05-29 DIAGNOSIS — M79674 Pain in right toe(s): Secondary | ICD-10-CM | POA: Diagnosis not present

## 2018-05-29 DIAGNOSIS — L97511 Non-pressure chronic ulcer of other part of right foot limited to breakdown of skin: Secondary | ICD-10-CM | POA: Diagnosis not present

## 2018-05-31 DIAGNOSIS — R5381 Other malaise: Secondary | ICD-10-CM | POA: Diagnosis not present

## 2018-05-31 DIAGNOSIS — Z79899 Other long term (current) drug therapy: Secondary | ICD-10-CM | POA: Diagnosis not present

## 2018-05-31 DIAGNOSIS — D649 Anemia, unspecified: Secondary | ICD-10-CM | POA: Diagnosis not present

## 2018-05-31 DIAGNOSIS — I10 Essential (primary) hypertension: Secondary | ICD-10-CM | POA: Diagnosis not present

## 2018-06-01 DIAGNOSIS — M158 Other polyosteoarthritis: Secondary | ICD-10-CM | POA: Diagnosis not present

## 2018-06-01 DIAGNOSIS — R21 Rash and other nonspecific skin eruption: Secondary | ICD-10-CM | POA: Diagnosis not present

## 2018-06-01 DIAGNOSIS — N183 Chronic kidney disease, stage 3 (moderate): Secondary | ICD-10-CM | POA: Diagnosis not present

## 2018-06-01 DIAGNOSIS — D638 Anemia in other chronic diseases classified elsewhere: Secondary | ICD-10-CM | POA: Diagnosis not present

## 2018-06-09 DIAGNOSIS — L97519 Non-pressure chronic ulcer of other part of right foot with unspecified severity: Secondary | ICD-10-CM | POA: Diagnosis not present

## 2018-06-09 DIAGNOSIS — B351 Tinea unguium: Secondary | ICD-10-CM | POA: Diagnosis not present

## 2018-06-09 DIAGNOSIS — R262 Difficulty in walking, not elsewhere classified: Secondary | ICD-10-CM | POA: Diagnosis not present

## 2018-06-09 DIAGNOSIS — I739 Peripheral vascular disease, unspecified: Secondary | ICD-10-CM | POA: Diagnosis not present

## 2018-06-13 DIAGNOSIS — L738 Other specified follicular disorders: Secondary | ICD-10-CM | POA: Diagnosis not present

## 2018-06-13 DIAGNOSIS — L97519 Non-pressure chronic ulcer of other part of right foot with unspecified severity: Secondary | ICD-10-CM | POA: Diagnosis not present

## 2018-06-14 DIAGNOSIS — D509 Iron deficiency anemia, unspecified: Secondary | ICD-10-CM | POA: Diagnosis not present

## 2018-06-14 DIAGNOSIS — D649 Anemia, unspecified: Secondary | ICD-10-CM | POA: Diagnosis not present

## 2018-06-14 DIAGNOSIS — R69 Illness, unspecified: Secondary | ICD-10-CM | POA: Diagnosis not present

## 2018-06-14 DIAGNOSIS — N189 Chronic kidney disease, unspecified: Secondary | ICD-10-CM | POA: Diagnosis not present

## 2018-06-14 DIAGNOSIS — R5383 Other fatigue: Secondary | ICD-10-CM | POA: Diagnosis not present

## 2018-06-14 DIAGNOSIS — Z206 Contact with and (suspected) exposure to human immunodeficiency virus [HIV]: Secondary | ICD-10-CM | POA: Diagnosis not present

## 2018-06-14 DIAGNOSIS — Z79899 Other long term (current) drug therapy: Secondary | ICD-10-CM | POA: Diagnosis not present

## 2018-06-20 DIAGNOSIS — L738 Other specified follicular disorders: Secondary | ICD-10-CM | POA: Diagnosis not present

## 2018-06-20 DIAGNOSIS — G20 Parkinson's disease: Secondary | ICD-10-CM | POA: Diagnosis not present

## 2018-06-20 DIAGNOSIS — L97519 Non-pressure chronic ulcer of other part of right foot with unspecified severity: Secondary | ICD-10-CM | POA: Diagnosis not present

## 2018-06-20 DIAGNOSIS — I10 Essential (primary) hypertension: Secondary | ICD-10-CM | POA: Diagnosis not present

## 2018-06-20 DIAGNOSIS — R69 Illness, unspecified: Secondary | ICD-10-CM | POA: Diagnosis not present

## 2018-06-20 DIAGNOSIS — I7389 Other specified peripheral vascular diseases: Secondary | ICD-10-CM | POA: Diagnosis not present

## 2018-06-27 DIAGNOSIS — L97519 Non-pressure chronic ulcer of other part of right foot with unspecified severity: Secondary | ICD-10-CM | POA: Diagnosis not present

## 2018-06-27 DIAGNOSIS — L738 Other specified follicular disorders: Secondary | ICD-10-CM | POA: Diagnosis not present

## 2018-06-28 DIAGNOSIS — S2222XD Fracture of body of sternum, subsequent encounter for fracture with routine healing: Secondary | ICD-10-CM | POA: Diagnosis not present

## 2018-06-28 DIAGNOSIS — G20 Parkinson's disease: Secondary | ICD-10-CM | POA: Diagnosis not present

## 2018-06-28 DIAGNOSIS — L97511 Non-pressure chronic ulcer of other part of right foot limited to breakdown of skin: Secondary | ICD-10-CM | POA: Diagnosis not present

## 2018-06-28 DIAGNOSIS — D649 Anemia, unspecified: Secondary | ICD-10-CM | POA: Diagnosis not present

## 2018-06-28 DIAGNOSIS — M159 Polyosteoarthritis, unspecified: Secondary | ICD-10-CM | POA: Diagnosis not present

## 2018-06-28 DIAGNOSIS — G8929 Other chronic pain: Secondary | ICD-10-CM | POA: Diagnosis not present

## 2018-06-28 DIAGNOSIS — N189 Chronic kidney disease, unspecified: Secondary | ICD-10-CM | POA: Diagnosis not present

## 2018-06-28 DIAGNOSIS — E042 Nontoxic multinodular goiter: Secondary | ICD-10-CM | POA: Diagnosis not present

## 2018-06-28 DIAGNOSIS — M79674 Pain in right toe(s): Secondary | ICD-10-CM | POA: Diagnosis not present

## 2018-06-28 DIAGNOSIS — N3941 Urge incontinence: Secondary | ICD-10-CM | POA: Diagnosis not present

## 2018-07-04 DIAGNOSIS — N183 Chronic kidney disease, stage 3 (moderate): Secondary | ICD-10-CM | POA: Diagnosis not present

## 2018-07-04 DIAGNOSIS — L738 Other specified follicular disorders: Secondary | ICD-10-CM | POA: Diagnosis not present

## 2018-07-04 DIAGNOSIS — L97519 Non-pressure chronic ulcer of other part of right foot with unspecified severity: Secondary | ICD-10-CM | POA: Diagnosis not present

## 2018-07-11 ENCOUNTER — Encounter (INDEPENDENT_AMBULATORY_CARE_PROVIDER_SITE_OTHER): Payer: Self-pay | Admitting: Internal Medicine

## 2018-07-11 ENCOUNTER — Ambulatory Visit (INDEPENDENT_AMBULATORY_CARE_PROVIDER_SITE_OTHER): Payer: 59 | Admitting: Internal Medicine

## 2018-07-11 DIAGNOSIS — L738 Other specified follicular disorders: Secondary | ICD-10-CM | POA: Diagnosis not present

## 2018-07-11 DIAGNOSIS — L97519 Non-pressure chronic ulcer of other part of right foot with unspecified severity: Secondary | ICD-10-CM | POA: Diagnosis not present

## 2018-07-18 DIAGNOSIS — I7389 Other specified peripheral vascular diseases: Secondary | ICD-10-CM | POA: Diagnosis not present

## 2018-07-18 DIAGNOSIS — B3789 Other sites of candidiasis: Secondary | ICD-10-CM | POA: Diagnosis not present

## 2018-07-18 DIAGNOSIS — L97109 Non-pressure chronic ulcer of unspecified thigh with unspecified severity: Secondary | ICD-10-CM | POA: Diagnosis not present

## 2018-07-18 DIAGNOSIS — D638 Anemia in other chronic diseases classified elsewhere: Secondary | ICD-10-CM | POA: Diagnosis not present

## 2018-07-18 DIAGNOSIS — L97519 Non-pressure chronic ulcer of other part of right foot with unspecified severity: Secondary | ICD-10-CM | POA: Diagnosis not present

## 2018-07-18 DIAGNOSIS — R69 Illness, unspecified: Secondary | ICD-10-CM | POA: Diagnosis not present

## 2018-07-18 DIAGNOSIS — I10 Essential (primary) hypertension: Secondary | ICD-10-CM | POA: Diagnosis not present

## 2018-07-18 DIAGNOSIS — G20 Parkinson's disease: Secondary | ICD-10-CM | POA: Diagnosis not present

## 2018-07-19 DIAGNOSIS — R531 Weakness: Secondary | ICD-10-CM | POA: Diagnosis not present

## 2018-07-19 DIAGNOSIS — D649 Anemia, unspecified: Secondary | ICD-10-CM | POA: Diagnosis not present

## 2018-07-24 ENCOUNTER — Telehealth: Payer: Self-pay | Admitting: Family Medicine

## 2018-07-24 NOTE — Telephone Encounter (Signed)
Pt moved to [place] and will not be coming back to [facility].  Removed Dr Griffin Dakin from PCP per patient.

## 2018-07-25 DIAGNOSIS — L97129 Non-pressure chronic ulcer of left thigh with unspecified severity: Secondary | ICD-10-CM | POA: Diagnosis not present

## 2018-07-25 DIAGNOSIS — L97519 Non-pressure chronic ulcer of other part of right foot with unspecified severity: Secondary | ICD-10-CM | POA: Diagnosis not present

## 2018-07-25 DIAGNOSIS — L988 Other specified disorders of the skin and subcutaneous tissue: Secondary | ICD-10-CM | POA: Diagnosis not present

## 2018-07-29 DIAGNOSIS — L97511 Non-pressure chronic ulcer of other part of right foot limited to breakdown of skin: Secondary | ICD-10-CM | POA: Diagnosis not present

## 2018-07-29 DIAGNOSIS — G20 Parkinson's disease: Secondary | ICD-10-CM | POA: Diagnosis not present

## 2018-07-29 DIAGNOSIS — G8929 Other chronic pain: Secondary | ICD-10-CM | POA: Diagnosis not present

## 2018-07-29 DIAGNOSIS — M79674 Pain in right toe(s): Secondary | ICD-10-CM | POA: Diagnosis not present

## 2018-07-29 DIAGNOSIS — M159 Polyosteoarthritis, unspecified: Secondary | ICD-10-CM | POA: Diagnosis not present

## 2018-07-29 DIAGNOSIS — S2222XD Fracture of body of sternum, subsequent encounter for fracture with routine healing: Secondary | ICD-10-CM | POA: Diagnosis not present

## 2018-07-29 DIAGNOSIS — E042 Nontoxic multinodular goiter: Secondary | ICD-10-CM | POA: Diagnosis not present

## 2018-07-29 DIAGNOSIS — N3941 Urge incontinence: Secondary | ICD-10-CM | POA: Diagnosis not present

## 2018-08-03 DIAGNOSIS — L97129 Non-pressure chronic ulcer of left thigh with unspecified severity: Secondary | ICD-10-CM | POA: Diagnosis not present

## 2018-08-03 DIAGNOSIS — L97519 Non-pressure chronic ulcer of other part of right foot with unspecified severity: Secondary | ICD-10-CM | POA: Diagnosis not present

## 2018-08-09 DIAGNOSIS — I10 Essential (primary) hypertension: Secondary | ICD-10-CM | POA: Diagnosis not present

## 2018-08-09 DIAGNOSIS — Z79899 Other long term (current) drug therapy: Secondary | ICD-10-CM | POA: Diagnosis not present

## 2018-08-09 DIAGNOSIS — D509 Iron deficiency anemia, unspecified: Secondary | ICD-10-CM | POA: Diagnosis not present

## 2018-08-15 DIAGNOSIS — L97519 Non-pressure chronic ulcer of other part of right foot with unspecified severity: Secondary | ICD-10-CM | POA: Diagnosis not present

## 2018-08-15 DIAGNOSIS — R195 Other fecal abnormalities: Secondary | ICD-10-CM | POA: Diagnosis not present

## 2018-08-15 DIAGNOSIS — D638 Anemia in other chronic diseases classified elsewhere: Secondary | ICD-10-CM | POA: Diagnosis not present

## 2018-08-18 DIAGNOSIS — G20 Parkinson's disease: Secondary | ICD-10-CM | POA: Diagnosis not present

## 2018-08-18 DIAGNOSIS — I7389 Other specified peripheral vascular diseases: Secondary | ICD-10-CM | POA: Diagnosis not present

## 2018-08-18 DIAGNOSIS — R69 Illness, unspecified: Secondary | ICD-10-CM | POA: Diagnosis not present

## 2018-08-18 DIAGNOSIS — I10 Essential (primary) hypertension: Secondary | ICD-10-CM | POA: Diagnosis not present

## 2018-08-23 DIAGNOSIS — D509 Iron deficiency anemia, unspecified: Secondary | ICD-10-CM | POA: Diagnosis not present

## 2018-08-24 DIAGNOSIS — G20 Parkinson's disease: Secondary | ICD-10-CM | POA: Diagnosis not present

## 2018-08-24 DIAGNOSIS — D638 Anemia in other chronic diseases classified elsewhere: Secondary | ICD-10-CM | POA: Diagnosis not present

## 2018-08-29 DIAGNOSIS — G8929 Other chronic pain: Secondary | ICD-10-CM | POA: Diagnosis not present

## 2018-08-29 DIAGNOSIS — S2222XD Fracture of body of sternum, subsequent encounter for fracture with routine healing: Secondary | ICD-10-CM | POA: Diagnosis not present

## 2018-08-29 DIAGNOSIS — M159 Polyosteoarthritis, unspecified: Secondary | ICD-10-CM | POA: Diagnosis not present

## 2018-08-29 DIAGNOSIS — E042 Nontoxic multinodular goiter: Secondary | ICD-10-CM | POA: Diagnosis not present

## 2018-08-29 DIAGNOSIS — N3941 Urge incontinence: Secondary | ICD-10-CM | POA: Diagnosis not present

## 2018-08-29 DIAGNOSIS — G20 Parkinson's disease: Secondary | ICD-10-CM | POA: Diagnosis not present

## 2018-08-29 DIAGNOSIS — M79674 Pain in right toe(s): Secondary | ICD-10-CM | POA: Diagnosis not present

## 2018-08-29 DIAGNOSIS — L97511 Non-pressure chronic ulcer of other part of right foot limited to breakdown of skin: Secondary | ICD-10-CM | POA: Diagnosis not present

## 2018-09-12 DIAGNOSIS — L988 Other specified disorders of the skin and subcutaneous tissue: Secondary | ICD-10-CM | POA: Diagnosis not present

## 2018-09-12 DIAGNOSIS — M270 Developmental disorders of jaws: Secondary | ICD-10-CM | POA: Diagnosis not present

## 2018-09-12 DIAGNOSIS — K1379 Other lesions of oral mucosa: Secondary | ICD-10-CM | POA: Diagnosis not present

## 2018-09-14 DIAGNOSIS — B351 Tinea unguium: Secondary | ICD-10-CM | POA: Diagnosis not present

## 2018-09-14 DIAGNOSIS — I739 Peripheral vascular disease, unspecified: Secondary | ICD-10-CM | POA: Diagnosis not present

## 2018-09-15 DIAGNOSIS — I10 Essential (primary) hypertension: Secondary | ICD-10-CM | POA: Diagnosis not present

## 2018-09-15 DIAGNOSIS — G20 Parkinson's disease: Secondary | ICD-10-CM | POA: Diagnosis not present

## 2018-09-15 DIAGNOSIS — Z862 Personal history of diseases of the blood and blood-forming organs and certain disorders involving the immune mechanism: Secondary | ICD-10-CM | POA: Diagnosis not present

## 2018-09-15 DIAGNOSIS — R69 Illness, unspecified: Secondary | ICD-10-CM | POA: Diagnosis not present

## 2018-09-20 DIAGNOSIS — I10 Essential (primary) hypertension: Secondary | ICD-10-CM | POA: Diagnosis not present

## 2018-09-20 DIAGNOSIS — R5381 Other malaise: Secondary | ICD-10-CM | POA: Diagnosis not present

## 2018-09-20 DIAGNOSIS — D649 Anemia, unspecified: Secondary | ICD-10-CM | POA: Diagnosis not present

## 2018-09-20 DIAGNOSIS — Z79899 Other long term (current) drug therapy: Secondary | ICD-10-CM | POA: Diagnosis not present

## 2018-09-28 DIAGNOSIS — M159 Polyosteoarthritis, unspecified: Secondary | ICD-10-CM | POA: Diagnosis not present

## 2018-09-28 DIAGNOSIS — G8929 Other chronic pain: Secondary | ICD-10-CM | POA: Diagnosis not present

## 2018-09-28 DIAGNOSIS — M79674 Pain in right toe(s): Secondary | ICD-10-CM | POA: Diagnosis not present

## 2018-09-28 DIAGNOSIS — E042 Nontoxic multinodular goiter: Secondary | ICD-10-CM | POA: Diagnosis not present

## 2018-09-28 DIAGNOSIS — S2222XD Fracture of body of sternum, subsequent encounter for fracture with routine healing: Secondary | ICD-10-CM | POA: Diagnosis not present

## 2018-09-28 DIAGNOSIS — L97511 Non-pressure chronic ulcer of other part of right foot limited to breakdown of skin: Secondary | ICD-10-CM | POA: Diagnosis not present

## 2018-09-28 DIAGNOSIS — G20 Parkinson's disease: Secondary | ICD-10-CM | POA: Diagnosis not present

## 2018-09-28 DIAGNOSIS — N3941 Urge incontinence: Secondary | ICD-10-CM | POA: Diagnosis not present

## 2018-10-01 DIAGNOSIS — R69 Illness, unspecified: Secondary | ICD-10-CM | POA: Diagnosis not present

## 2018-10-03 DIAGNOSIS — J3489 Other specified disorders of nose and nasal sinuses: Secondary | ICD-10-CM | POA: Diagnosis not present

## 2018-10-12 DIAGNOSIS — R69 Illness, unspecified: Secondary | ICD-10-CM | POA: Diagnosis not present

## 2018-10-12 DIAGNOSIS — I10 Essential (primary) hypertension: Secondary | ICD-10-CM | POA: Diagnosis not present

## 2018-10-12 DIAGNOSIS — G20 Parkinson's disease: Secondary | ICD-10-CM | POA: Diagnosis not present

## 2018-10-12 DIAGNOSIS — I7389 Other specified peripheral vascular diseases: Secondary | ICD-10-CM | POA: Diagnosis not present

## 2018-10-17 DIAGNOSIS — I10 Essential (primary) hypertension: Secondary | ICD-10-CM | POA: Diagnosis not present

## 2018-10-17 DIAGNOSIS — M150 Primary generalized (osteo)arthritis: Secondary | ICD-10-CM | POA: Diagnosis not present

## 2018-10-17 DIAGNOSIS — K219 Gastro-esophageal reflux disease without esophagitis: Secondary | ICD-10-CM | POA: Diagnosis not present

## 2018-10-17 DIAGNOSIS — G20 Parkinson's disease: Secondary | ICD-10-CM | POA: Diagnosis not present

## 2018-10-26 DIAGNOSIS — B353 Tinea pedis: Secondary | ICD-10-CM | POA: Diagnosis not present

## 2018-10-26 DIAGNOSIS — R531 Weakness: Secondary | ICD-10-CM | POA: Diagnosis not present

## 2018-10-29 DIAGNOSIS — N3941 Urge incontinence: Secondary | ICD-10-CM | POA: Diagnosis not present

## 2018-10-29 DIAGNOSIS — M159 Polyosteoarthritis, unspecified: Secondary | ICD-10-CM | POA: Diagnosis not present

## 2018-10-29 DIAGNOSIS — G8929 Other chronic pain: Secondary | ICD-10-CM | POA: Diagnosis not present

## 2018-10-29 DIAGNOSIS — L97511 Non-pressure chronic ulcer of other part of right foot limited to breakdown of skin: Secondary | ICD-10-CM | POA: Diagnosis not present

## 2018-10-29 DIAGNOSIS — E042 Nontoxic multinodular goiter: Secondary | ICD-10-CM | POA: Diagnosis not present

## 2018-10-29 DIAGNOSIS — G20 Parkinson's disease: Secondary | ICD-10-CM | POA: Diagnosis not present

## 2018-10-29 DIAGNOSIS — M79674 Pain in right toe(s): Secondary | ICD-10-CM | POA: Diagnosis not present

## 2018-10-29 DIAGNOSIS — S2222XD Fracture of body of sternum, subsequent encounter for fracture with routine healing: Secondary | ICD-10-CM | POA: Diagnosis not present

## 2018-10-31 DIAGNOSIS — M150 Primary generalized (osteo)arthritis: Secondary | ICD-10-CM | POA: Diagnosis not present

## 2018-10-31 DIAGNOSIS — R531 Weakness: Secondary | ICD-10-CM | POA: Diagnosis not present

## 2018-10-31 DIAGNOSIS — N907 Vulvar cyst: Secondary | ICD-10-CM | POA: Diagnosis not present

## 2018-11-07 DIAGNOSIS — I10 Essential (primary) hypertension: Secondary | ICD-10-CM | POA: Diagnosis not present

## 2018-11-07 DIAGNOSIS — R5381 Other malaise: Secondary | ICD-10-CM | POA: Diagnosis not present

## 2018-11-07 DIAGNOSIS — M158 Other polyosteoarthritis: Secondary | ICD-10-CM | POA: Diagnosis not present

## 2018-11-07 DIAGNOSIS — N907 Vulvar cyst: Secondary | ICD-10-CM | POA: Diagnosis not present

## 2018-11-13 DIAGNOSIS — M6281 Muscle weakness (generalized): Secondary | ICD-10-CM | POA: Diagnosis not present

## 2018-11-13 DIAGNOSIS — I10 Essential (primary) hypertension: Secondary | ICD-10-CM | POA: Diagnosis not present

## 2018-11-13 DIAGNOSIS — Z7409 Other reduced mobility: Secondary | ICD-10-CM | POA: Diagnosis not present

## 2018-11-13 DIAGNOSIS — R488 Other symbolic dysfunctions: Secondary | ICD-10-CM | POA: Diagnosis not present

## 2018-11-13 DIAGNOSIS — G20 Parkinson's disease: Secondary | ICD-10-CM | POA: Diagnosis not present

## 2018-11-13 DIAGNOSIS — R2689 Other abnormalities of gait and mobility: Secondary | ICD-10-CM | POA: Diagnosis not present

## 2018-11-13 DIAGNOSIS — N766 Ulceration of vulva: Secondary | ICD-10-CM | POA: Diagnosis not present

## 2018-11-13 DIAGNOSIS — R279 Unspecified lack of coordination: Secondary | ICD-10-CM | POA: Diagnosis not present

## 2018-11-13 DIAGNOSIS — R293 Abnormal posture: Secondary | ICD-10-CM | POA: Diagnosis not present

## 2018-11-14 ENCOUNTER — Encounter: Payer: Self-pay | Admitting: *Deleted

## 2018-11-14 DIAGNOSIS — R2689 Other abnormalities of gait and mobility: Secondary | ICD-10-CM | POA: Diagnosis not present

## 2018-11-14 DIAGNOSIS — R293 Abnormal posture: Secondary | ICD-10-CM | POA: Diagnosis not present

## 2018-11-14 DIAGNOSIS — R488 Other symbolic dysfunctions: Secondary | ICD-10-CM | POA: Diagnosis not present

## 2018-11-14 DIAGNOSIS — R279 Unspecified lack of coordination: Secondary | ICD-10-CM | POA: Diagnosis not present

## 2018-11-14 DIAGNOSIS — M6281 Muscle weakness (generalized): Secondary | ICD-10-CM | POA: Diagnosis not present

## 2018-11-14 DIAGNOSIS — G20 Parkinson's disease: Secondary | ICD-10-CM | POA: Diagnosis not present

## 2018-11-15 DIAGNOSIS — R2689 Other abnormalities of gait and mobility: Secondary | ICD-10-CM | POA: Diagnosis not present

## 2018-11-15 DIAGNOSIS — R488 Other symbolic dysfunctions: Secondary | ICD-10-CM | POA: Diagnosis not present

## 2018-11-15 DIAGNOSIS — M6281 Muscle weakness (generalized): Secondary | ICD-10-CM | POA: Diagnosis not present

## 2018-11-15 DIAGNOSIS — R279 Unspecified lack of coordination: Secondary | ICD-10-CM | POA: Diagnosis not present

## 2018-11-15 DIAGNOSIS — R293 Abnormal posture: Secondary | ICD-10-CM | POA: Diagnosis not present

## 2018-11-15 DIAGNOSIS — G20 Parkinson's disease: Secondary | ICD-10-CM | POA: Diagnosis not present

## 2018-11-16 DIAGNOSIS — R293 Abnormal posture: Secondary | ICD-10-CM | POA: Diagnosis not present

## 2018-11-16 DIAGNOSIS — M6281 Muscle weakness (generalized): Secondary | ICD-10-CM | POA: Diagnosis not present

## 2018-11-16 DIAGNOSIS — R488 Other symbolic dysfunctions: Secondary | ICD-10-CM | POA: Diagnosis not present

## 2018-11-16 DIAGNOSIS — G20 Parkinson's disease: Secondary | ICD-10-CM | POA: Diagnosis not present

## 2018-11-16 DIAGNOSIS — R2689 Other abnormalities of gait and mobility: Secondary | ICD-10-CM | POA: Diagnosis not present

## 2018-11-16 DIAGNOSIS — R279 Unspecified lack of coordination: Secondary | ICD-10-CM | POA: Diagnosis not present

## 2018-11-17 DIAGNOSIS — R2689 Other abnormalities of gait and mobility: Secondary | ICD-10-CM | POA: Diagnosis not present

## 2018-11-17 DIAGNOSIS — R279 Unspecified lack of coordination: Secondary | ICD-10-CM | POA: Diagnosis not present

## 2018-11-17 DIAGNOSIS — M6281 Muscle weakness (generalized): Secondary | ICD-10-CM | POA: Diagnosis not present

## 2018-11-17 DIAGNOSIS — R488 Other symbolic dysfunctions: Secondary | ICD-10-CM | POA: Diagnosis not present

## 2018-11-17 DIAGNOSIS — R293 Abnormal posture: Secondary | ICD-10-CM | POA: Diagnosis not present

## 2018-11-17 DIAGNOSIS — G20 Parkinson's disease: Secondary | ICD-10-CM | POA: Diagnosis not present

## 2018-11-19 DIAGNOSIS — R2689 Other abnormalities of gait and mobility: Secondary | ICD-10-CM | POA: Diagnosis not present

## 2018-11-19 DIAGNOSIS — R488 Other symbolic dysfunctions: Secondary | ICD-10-CM | POA: Diagnosis not present

## 2018-11-19 DIAGNOSIS — G20 Parkinson's disease: Secondary | ICD-10-CM | POA: Diagnosis not present

## 2018-11-19 DIAGNOSIS — R279 Unspecified lack of coordination: Secondary | ICD-10-CM | POA: Diagnosis not present

## 2018-11-19 DIAGNOSIS — R293 Abnormal posture: Secondary | ICD-10-CM | POA: Diagnosis not present

## 2018-11-19 DIAGNOSIS — M6281 Muscle weakness (generalized): Secondary | ICD-10-CM | POA: Diagnosis not present

## 2018-11-20 DIAGNOSIS — R279 Unspecified lack of coordination: Secondary | ICD-10-CM | POA: Diagnosis not present

## 2018-11-20 DIAGNOSIS — R488 Other symbolic dysfunctions: Secondary | ICD-10-CM | POA: Diagnosis not present

## 2018-11-20 DIAGNOSIS — R293 Abnormal posture: Secondary | ICD-10-CM | POA: Diagnosis not present

## 2018-11-20 DIAGNOSIS — R2689 Other abnormalities of gait and mobility: Secondary | ICD-10-CM | POA: Diagnosis not present

## 2018-11-20 DIAGNOSIS — G20 Parkinson's disease: Secondary | ICD-10-CM | POA: Diagnosis not present

## 2018-11-20 DIAGNOSIS — M6281 Muscle weakness (generalized): Secondary | ICD-10-CM | POA: Diagnosis not present

## 2018-11-21 DIAGNOSIS — R2689 Other abnormalities of gait and mobility: Secondary | ICD-10-CM | POA: Diagnosis not present

## 2018-11-21 DIAGNOSIS — R293 Abnormal posture: Secondary | ICD-10-CM | POA: Diagnosis not present

## 2018-11-21 DIAGNOSIS — G20 Parkinson's disease: Secondary | ICD-10-CM | POA: Diagnosis not present

## 2018-11-21 DIAGNOSIS — M6281 Muscle weakness (generalized): Secondary | ICD-10-CM | POA: Diagnosis not present

## 2018-11-21 DIAGNOSIS — R488 Other symbolic dysfunctions: Secondary | ICD-10-CM | POA: Diagnosis not present

## 2018-11-21 DIAGNOSIS — R279 Unspecified lack of coordination: Secondary | ICD-10-CM | POA: Diagnosis not present

## 2018-11-22 DIAGNOSIS — R488 Other symbolic dysfunctions: Secondary | ICD-10-CM | POA: Diagnosis not present

## 2018-11-22 DIAGNOSIS — R2689 Other abnormalities of gait and mobility: Secondary | ICD-10-CM | POA: Diagnosis not present

## 2018-11-22 DIAGNOSIS — R293 Abnormal posture: Secondary | ICD-10-CM | POA: Diagnosis not present

## 2018-11-22 DIAGNOSIS — G20 Parkinson's disease: Secondary | ICD-10-CM | POA: Diagnosis not present

## 2018-11-22 DIAGNOSIS — R279 Unspecified lack of coordination: Secondary | ICD-10-CM | POA: Diagnosis not present

## 2018-11-22 DIAGNOSIS — M6281 Muscle weakness (generalized): Secondary | ICD-10-CM | POA: Diagnosis not present

## 2018-11-24 DIAGNOSIS — R279 Unspecified lack of coordination: Secondary | ICD-10-CM | POA: Diagnosis not present

## 2018-11-24 DIAGNOSIS — G20 Parkinson's disease: Secondary | ICD-10-CM | POA: Diagnosis not present

## 2018-11-24 DIAGNOSIS — R293 Abnormal posture: Secondary | ICD-10-CM | POA: Diagnosis not present

## 2018-11-24 DIAGNOSIS — M6281 Muscle weakness (generalized): Secondary | ICD-10-CM | POA: Diagnosis not present

## 2018-11-24 DIAGNOSIS — R488 Other symbolic dysfunctions: Secondary | ICD-10-CM | POA: Diagnosis not present

## 2018-11-24 DIAGNOSIS — R2689 Other abnormalities of gait and mobility: Secondary | ICD-10-CM | POA: Diagnosis not present

## 2018-11-27 DIAGNOSIS — G20 Parkinson's disease: Secondary | ICD-10-CM | POA: Diagnosis not present

## 2018-11-27 DIAGNOSIS — R488 Other symbolic dysfunctions: Secondary | ICD-10-CM | POA: Diagnosis not present

## 2018-11-27 DIAGNOSIS — R293 Abnormal posture: Secondary | ICD-10-CM | POA: Diagnosis not present

## 2018-11-27 DIAGNOSIS — N766 Ulceration of vulva: Secondary | ICD-10-CM | POA: Diagnosis not present

## 2018-11-27 DIAGNOSIS — R279 Unspecified lack of coordination: Secondary | ICD-10-CM | POA: Diagnosis not present

## 2018-11-27 DIAGNOSIS — M6281 Muscle weakness (generalized): Secondary | ICD-10-CM | POA: Diagnosis not present

## 2018-11-27 DIAGNOSIS — R2689 Other abnormalities of gait and mobility: Secondary | ICD-10-CM | POA: Diagnosis not present

## 2018-11-27 DIAGNOSIS — Z789 Other specified health status: Secondary | ICD-10-CM | POA: Diagnosis not present

## 2018-11-28 DIAGNOSIS — R279 Unspecified lack of coordination: Secondary | ICD-10-CM | POA: Diagnosis not present

## 2018-11-28 DIAGNOSIS — M6281 Muscle weakness (generalized): Secondary | ICD-10-CM | POA: Diagnosis not present

## 2018-11-28 DIAGNOSIS — M79674 Pain in right toe(s): Secondary | ICD-10-CM | POA: Diagnosis not present

## 2018-11-28 DIAGNOSIS — S2222XD Fracture of body of sternum, subsequent encounter for fracture with routine healing: Secondary | ICD-10-CM | POA: Diagnosis not present

## 2018-11-28 DIAGNOSIS — R293 Abnormal posture: Secondary | ICD-10-CM | POA: Diagnosis not present

## 2018-11-28 DIAGNOSIS — G8929 Other chronic pain: Secondary | ICD-10-CM | POA: Diagnosis not present

## 2018-11-28 DIAGNOSIS — E042 Nontoxic multinodular goiter: Secondary | ICD-10-CM | POA: Diagnosis not present

## 2018-11-28 DIAGNOSIS — L97511 Non-pressure chronic ulcer of other part of right foot limited to breakdown of skin: Secondary | ICD-10-CM | POA: Diagnosis not present

## 2018-11-28 DIAGNOSIS — N3941 Urge incontinence: Secondary | ICD-10-CM | POA: Diagnosis not present

## 2018-11-28 DIAGNOSIS — R2689 Other abnormalities of gait and mobility: Secondary | ICD-10-CM | POA: Diagnosis not present

## 2018-11-28 DIAGNOSIS — G20 Parkinson's disease: Secondary | ICD-10-CM | POA: Diagnosis not present

## 2018-11-28 DIAGNOSIS — M159 Polyosteoarthritis, unspecified: Secondary | ICD-10-CM | POA: Diagnosis not present

## 2018-11-28 DIAGNOSIS — R488 Other symbolic dysfunctions: Secondary | ICD-10-CM | POA: Diagnosis not present

## 2018-11-29 DIAGNOSIS — R279 Unspecified lack of coordination: Secondary | ICD-10-CM | POA: Diagnosis not present

## 2018-11-29 DIAGNOSIS — R2689 Other abnormalities of gait and mobility: Secondary | ICD-10-CM | POA: Diagnosis not present

## 2018-11-29 DIAGNOSIS — R293 Abnormal posture: Secondary | ICD-10-CM | POA: Diagnosis not present

## 2018-11-29 DIAGNOSIS — R488 Other symbolic dysfunctions: Secondary | ICD-10-CM | POA: Diagnosis not present

## 2018-11-29 DIAGNOSIS — M6281 Muscle weakness (generalized): Secondary | ICD-10-CM | POA: Diagnosis not present

## 2018-11-29 DIAGNOSIS — G20 Parkinson's disease: Secondary | ICD-10-CM | POA: Diagnosis not present

## 2018-11-30 DIAGNOSIS — R293 Abnormal posture: Secondary | ICD-10-CM | POA: Diagnosis not present

## 2018-11-30 DIAGNOSIS — G20 Parkinson's disease: Secondary | ICD-10-CM | POA: Diagnosis not present

## 2018-11-30 DIAGNOSIS — M25571 Pain in right ankle and joints of right foot: Secondary | ICD-10-CM | POA: Diagnosis not present

## 2018-11-30 DIAGNOSIS — M6281 Muscle weakness (generalized): Secondary | ICD-10-CM | POA: Diagnosis not present

## 2018-11-30 DIAGNOSIS — R279 Unspecified lack of coordination: Secondary | ICD-10-CM | POA: Diagnosis not present

## 2018-11-30 DIAGNOSIS — M79604 Pain in right leg: Secondary | ICD-10-CM | POA: Diagnosis not present

## 2018-11-30 DIAGNOSIS — R2689 Other abnormalities of gait and mobility: Secondary | ICD-10-CM | POA: Diagnosis not present

## 2018-11-30 DIAGNOSIS — R488 Other symbolic dysfunctions: Secondary | ICD-10-CM | POA: Diagnosis not present

## 2018-12-01 DIAGNOSIS — R488 Other symbolic dysfunctions: Secondary | ICD-10-CM | POA: Diagnosis not present

## 2018-12-01 DIAGNOSIS — R279 Unspecified lack of coordination: Secondary | ICD-10-CM | POA: Diagnosis not present

## 2018-12-01 DIAGNOSIS — R293 Abnormal posture: Secondary | ICD-10-CM | POA: Diagnosis not present

## 2018-12-01 DIAGNOSIS — R2689 Other abnormalities of gait and mobility: Secondary | ICD-10-CM | POA: Diagnosis not present

## 2018-12-01 DIAGNOSIS — M6281 Muscle weakness (generalized): Secondary | ICD-10-CM | POA: Diagnosis not present

## 2018-12-01 DIAGNOSIS — G20 Parkinson's disease: Secondary | ICD-10-CM | POA: Diagnosis not present

## 2018-12-04 DIAGNOSIS — R293 Abnormal posture: Secondary | ICD-10-CM | POA: Diagnosis not present

## 2018-12-04 DIAGNOSIS — G20 Parkinson's disease: Secondary | ICD-10-CM | POA: Diagnosis not present

## 2018-12-04 DIAGNOSIS — N182 Chronic kidney disease, stage 2 (mild): Secondary | ICD-10-CM | POA: Diagnosis not present

## 2018-12-04 DIAGNOSIS — R279 Unspecified lack of coordination: Secondary | ICD-10-CM | POA: Diagnosis not present

## 2018-12-04 DIAGNOSIS — G894 Chronic pain syndrome: Secondary | ICD-10-CM | POA: Diagnosis not present

## 2018-12-04 DIAGNOSIS — M158 Other polyosteoarthritis: Secondary | ICD-10-CM | POA: Diagnosis not present

## 2018-12-04 DIAGNOSIS — R2689 Other abnormalities of gait and mobility: Secondary | ICD-10-CM | POA: Diagnosis not present

## 2018-12-04 DIAGNOSIS — R488 Other symbolic dysfunctions: Secondary | ICD-10-CM | POA: Diagnosis not present

## 2018-12-04 DIAGNOSIS — M810 Age-related osteoporosis without current pathological fracture: Secondary | ICD-10-CM | POA: Diagnosis not present

## 2018-12-04 DIAGNOSIS — M6281 Muscle weakness (generalized): Secondary | ICD-10-CM | POA: Diagnosis not present

## 2018-12-05 DIAGNOSIS — R2689 Other abnormalities of gait and mobility: Secondary | ICD-10-CM | POA: Diagnosis not present

## 2018-12-05 DIAGNOSIS — R488 Other symbolic dysfunctions: Secondary | ICD-10-CM | POA: Diagnosis not present

## 2018-12-05 DIAGNOSIS — G20 Parkinson's disease: Secondary | ICD-10-CM | POA: Diagnosis not present

## 2018-12-05 DIAGNOSIS — R279 Unspecified lack of coordination: Secondary | ICD-10-CM | POA: Diagnosis not present

## 2018-12-05 DIAGNOSIS — M6281 Muscle weakness (generalized): Secondary | ICD-10-CM | POA: Diagnosis not present

## 2018-12-05 DIAGNOSIS — R293 Abnormal posture: Secondary | ICD-10-CM | POA: Diagnosis not present

## 2018-12-06 DIAGNOSIS — R279 Unspecified lack of coordination: Secondary | ICD-10-CM | POA: Diagnosis not present

## 2018-12-06 DIAGNOSIS — R2689 Other abnormalities of gait and mobility: Secondary | ICD-10-CM | POA: Diagnosis not present

## 2018-12-06 DIAGNOSIS — R488 Other symbolic dysfunctions: Secondary | ICD-10-CM | POA: Diagnosis not present

## 2018-12-06 DIAGNOSIS — G20 Parkinson's disease: Secondary | ICD-10-CM | POA: Diagnosis not present

## 2018-12-06 DIAGNOSIS — M6281 Muscle weakness (generalized): Secondary | ICD-10-CM | POA: Diagnosis not present

## 2018-12-06 DIAGNOSIS — R293 Abnormal posture: Secondary | ICD-10-CM | POA: Diagnosis not present

## 2018-12-07 ENCOUNTER — Encounter: Payer: Self-pay | Admitting: *Deleted

## 2018-12-07 DIAGNOSIS — G20 Parkinson's disease: Secondary | ICD-10-CM | POA: Diagnosis not present

## 2018-12-07 DIAGNOSIS — R279 Unspecified lack of coordination: Secondary | ICD-10-CM | POA: Diagnosis not present

## 2018-12-07 DIAGNOSIS — R293 Abnormal posture: Secondary | ICD-10-CM | POA: Diagnosis not present

## 2018-12-07 DIAGNOSIS — M6281 Muscle weakness (generalized): Secondary | ICD-10-CM | POA: Diagnosis not present

## 2018-12-07 DIAGNOSIS — R2689 Other abnormalities of gait and mobility: Secondary | ICD-10-CM | POA: Diagnosis not present

## 2018-12-07 DIAGNOSIS — R488 Other symbolic dysfunctions: Secondary | ICD-10-CM | POA: Diagnosis not present

## 2018-12-08 DIAGNOSIS — G20 Parkinson's disease: Secondary | ICD-10-CM | POA: Diagnosis not present

## 2018-12-08 DIAGNOSIS — R293 Abnormal posture: Secondary | ICD-10-CM | POA: Diagnosis not present

## 2018-12-08 DIAGNOSIS — R488 Other symbolic dysfunctions: Secondary | ICD-10-CM | POA: Diagnosis not present

## 2018-12-08 DIAGNOSIS — M6281 Muscle weakness (generalized): Secondary | ICD-10-CM | POA: Diagnosis not present

## 2018-12-08 DIAGNOSIS — R279 Unspecified lack of coordination: Secondary | ICD-10-CM | POA: Diagnosis not present

## 2018-12-08 DIAGNOSIS — R2689 Other abnormalities of gait and mobility: Secondary | ICD-10-CM | POA: Diagnosis not present

## 2018-12-09 DIAGNOSIS — R279 Unspecified lack of coordination: Secondary | ICD-10-CM | POA: Diagnosis not present

## 2018-12-09 DIAGNOSIS — M6281 Muscle weakness (generalized): Secondary | ICD-10-CM | POA: Diagnosis not present

## 2018-12-09 DIAGNOSIS — R2689 Other abnormalities of gait and mobility: Secondary | ICD-10-CM | POA: Diagnosis not present

## 2018-12-09 DIAGNOSIS — R293 Abnormal posture: Secondary | ICD-10-CM | POA: Diagnosis not present

## 2018-12-09 DIAGNOSIS — R488 Other symbolic dysfunctions: Secondary | ICD-10-CM | POA: Diagnosis not present

## 2018-12-09 DIAGNOSIS — G20 Parkinson's disease: Secondary | ICD-10-CM | POA: Diagnosis not present

## 2018-12-11 DIAGNOSIS — R2689 Other abnormalities of gait and mobility: Secondary | ICD-10-CM | POA: Diagnosis not present

## 2018-12-11 DIAGNOSIS — R293 Abnormal posture: Secondary | ICD-10-CM | POA: Diagnosis not present

## 2018-12-11 DIAGNOSIS — L8991 Pressure ulcer of unspecified site, stage 1: Secondary | ICD-10-CM | POA: Diagnosis not present

## 2018-12-11 DIAGNOSIS — L89892 Pressure ulcer of other site, stage 2: Secondary | ICD-10-CM | POA: Diagnosis not present

## 2018-12-11 DIAGNOSIS — M6281 Muscle weakness (generalized): Secondary | ICD-10-CM | POA: Diagnosis not present

## 2018-12-11 DIAGNOSIS — G20 Parkinson's disease: Secondary | ICD-10-CM | POA: Diagnosis not present

## 2018-12-11 DIAGNOSIS — R279 Unspecified lack of coordination: Secondary | ICD-10-CM | POA: Diagnosis not present

## 2018-12-11 DIAGNOSIS — L89890 Pressure ulcer of other site, unstageable: Secondary | ICD-10-CM | POA: Diagnosis not present

## 2018-12-11 DIAGNOSIS — R488 Other symbolic dysfunctions: Secondary | ICD-10-CM | POA: Diagnosis not present

## 2018-12-14 DIAGNOSIS — R69 Illness, unspecified: Secondary | ICD-10-CM | POA: Diagnosis not present

## 2018-12-14 DIAGNOSIS — I10 Essential (primary) hypertension: Secondary | ICD-10-CM | POA: Diagnosis not present

## 2018-12-14 DIAGNOSIS — G20 Parkinson's disease: Secondary | ICD-10-CM | POA: Diagnosis not present

## 2018-12-14 DIAGNOSIS — I7389 Other specified peripheral vascular diseases: Secondary | ICD-10-CM | POA: Diagnosis not present

## 2022-10-27 DEATH — deceased
